# Patient Record
Sex: Male | Born: 1945 | Race: White | Hispanic: No | Marital: Married | Smoking: Former smoker
Health system: Southern US, Community
[De-identification: ages and names within clinical notes are randomized; demographics above are authoritative.]

## PROBLEM LIST (undated history)

## (undated) DIAGNOSIS — G4730 Sleep apnea, unspecified: Secondary | ICD-10-CM

## (undated) DIAGNOSIS — G4733 Obstructive sleep apnea (adult) (pediatric): Secondary | ICD-10-CM

## (undated) DIAGNOSIS — H409 Unspecified glaucoma: Secondary | ICD-10-CM

## (undated) DIAGNOSIS — I10 Essential (primary) hypertension: Secondary | ICD-10-CM

## (undated) DIAGNOSIS — J42 Unspecified chronic bronchitis: Secondary | ICD-10-CM

## (undated) DIAGNOSIS — T8859XA Other complications of anesthesia, initial encounter: Secondary | ICD-10-CM

## (undated) DIAGNOSIS — N200 Calculus of kidney: Secondary | ICD-10-CM

## (undated) DIAGNOSIS — I219 Acute myocardial infarction, unspecified: Secondary | ICD-10-CM

## (undated) DIAGNOSIS — E785 Hyperlipidemia, unspecified: Secondary | ICD-10-CM

## (undated) DIAGNOSIS — J189 Pneumonia, unspecified organism: Secondary | ICD-10-CM

## (undated) DIAGNOSIS — Z973 Presence of spectacles and contact lenses: Secondary | ICD-10-CM

## (undated) DIAGNOSIS — I2510 Atherosclerotic heart disease of native coronary artery without angina pectoris: Secondary | ICD-10-CM

## (undated) DIAGNOSIS — T7840XA Allergy, unspecified, initial encounter: Secondary | ICD-10-CM

## (undated) DIAGNOSIS — M1990 Unspecified osteoarthritis, unspecified site: Secondary | ICD-10-CM

## (undated) DIAGNOSIS — T4145XA Adverse effect of unspecified anesthetic, initial encounter: Secondary | ICD-10-CM

## (undated) HISTORY — PX: EYE SURGERY: SHX253

## (undated) HISTORY — DX: Sleep apnea, unspecified: G47.30

## (undated) HISTORY — DX: Obstructive sleep apnea (adult) (pediatric): G47.33

## (undated) HISTORY — DX: Unspecified glaucoma: H40.9

## (undated) HISTORY — DX: Atherosclerotic heart disease of native coronary artery without angina pectoris: I25.10

## (undated) HISTORY — DX: Essential (primary) hypertension: I10

## (undated) HISTORY — PX: KIDNEY STONE SURGERY: SHX686

## (undated) HISTORY — PX: CARDIAC CATHETERIZATION: SHX172

## (undated) HISTORY — DX: Allergy, unspecified, initial encounter: T78.40XA

## (undated) HISTORY — PX: OTHER SURGICAL HISTORY: SHX169

## (undated) HISTORY — DX: Calculus of kidney: N20.0

## (undated) HISTORY — PX: TONSILLECTOMY: SUR1361

## (undated) HISTORY — DX: Hyperlipidemia, unspecified: E78.5

## (undated) HISTORY — DX: Unspecified chronic bronchitis: J42

---
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
## 2001-03-31 ENCOUNTER — Ambulatory Visit (HOSPITAL_BASED_OUTPATIENT_CLINIC_OR_DEPARTMENT_OTHER): Admission: RE | Admit: 2001-03-31 | Discharge: 2001-03-31 | Payer: Self-pay | Admitting: Internal Medicine

## 2001-03-31 ENCOUNTER — Encounter: Payer: Self-pay | Admitting: Pulmonary Disease

## 2001-04-20 ENCOUNTER — Encounter: Payer: Self-pay | Admitting: Pulmonary Disease

## 2001-05-11 ENCOUNTER — Encounter (INDEPENDENT_AMBULATORY_CARE_PROVIDER_SITE_OTHER): Payer: Self-pay

## 2001-05-11 ENCOUNTER — Other Ambulatory Visit: Admission: RE | Admit: 2001-05-11 | Discharge: 2001-05-11 | Payer: Self-pay | Admitting: Internal Medicine

## 2003-08-27 DIAGNOSIS — I219 Acute myocardial infarction, unspecified: Secondary | ICD-10-CM

## 2003-08-27 HISTORY — DX: Acute myocardial infarction, unspecified: I21.9

## 2004-06-05 ENCOUNTER — Emergency Department (HOSPITAL_COMMUNITY): Admission: EM | Admit: 2004-06-05 | Discharge: 2004-06-05 | Payer: Self-pay | Admitting: Emergency Medicine

## 2004-06-21 ENCOUNTER — Ambulatory Visit (HOSPITAL_COMMUNITY): Admission: RE | Admit: 2004-06-21 | Discharge: 2004-06-21 | Payer: Self-pay | Admitting: Urology

## 2004-07-26 ENCOUNTER — Emergency Department (HOSPITAL_COMMUNITY): Admission: EM | Admit: 2004-07-26 | Discharge: 2004-07-26 | Payer: Self-pay | Admitting: Emergency Medicine

## 2004-07-28 ENCOUNTER — Inpatient Hospital Stay (HOSPITAL_COMMUNITY): Admission: EM | Admit: 2004-07-28 | Discharge: 2004-07-31 | Payer: Self-pay | Admitting: Emergency Medicine

## 2004-07-28 ENCOUNTER — Ambulatory Visit: Payer: Self-pay | Admitting: Internal Medicine

## 2004-08-13 ENCOUNTER — Ambulatory Visit: Payer: Self-pay | Admitting: Cardiology

## 2004-08-18 ENCOUNTER — Encounter (HOSPITAL_COMMUNITY): Admission: RE | Admit: 2004-08-18 | Discharge: 2004-11-16 | Payer: Self-pay | Admitting: Cardiology

## 2004-08-22 ENCOUNTER — Ambulatory Visit: Payer: Self-pay | Admitting: Internal Medicine

## 2004-08-22 ENCOUNTER — Observation Stay (HOSPITAL_COMMUNITY): Admission: AD | Admit: 2004-08-22 | Discharge: 2004-08-23 | Payer: Self-pay | Admitting: Cardiology

## 2004-08-22 ENCOUNTER — Ambulatory Visit: Payer: Self-pay | Admitting: Cardiology

## 2004-09-05 ENCOUNTER — Ambulatory Visit: Payer: Self-pay | Admitting: Cardiology

## 2004-09-12 ENCOUNTER — Ambulatory Visit: Payer: Self-pay | Admitting: Cardiology

## 2004-11-16 ENCOUNTER — Encounter (HOSPITAL_COMMUNITY): Admission: RE | Admit: 2004-11-16 | Discharge: 2005-02-14 | Payer: Self-pay | Admitting: Cardiology

## 2004-11-19 ENCOUNTER — Ambulatory Visit: Payer: Self-pay | Admitting: Cardiology

## 2004-11-20 ENCOUNTER — Ambulatory Visit: Payer: Self-pay | Admitting: Internal Medicine

## 2004-12-14 ENCOUNTER — Ambulatory Visit: Payer: Self-pay | Admitting: Internal Medicine

## 2005-02-11 ENCOUNTER — Ambulatory Visit: Payer: Self-pay | Admitting: Cardiology

## 2005-02-19 ENCOUNTER — Ambulatory Visit: Payer: Self-pay

## 2005-02-21 ENCOUNTER — Ambulatory Visit: Payer: Self-pay

## 2005-04-23 ENCOUNTER — Emergency Department (HOSPITAL_COMMUNITY): Admission: EM | Admit: 2005-04-23 | Discharge: 2005-04-23 | Payer: Self-pay

## 2005-04-23 ENCOUNTER — Ambulatory Visit: Payer: Self-pay | Admitting: Cardiology

## 2005-05-15 ENCOUNTER — Ambulatory Visit: Payer: Self-pay | Admitting: Cardiology

## 2005-08-20 IMAGING — CR DG ABDOMEN 1V
1 series · 1 of 1 positions shown · non-contrast
Comparison: none

HISTORY: Right ureteral calculus,

[view not recorded]
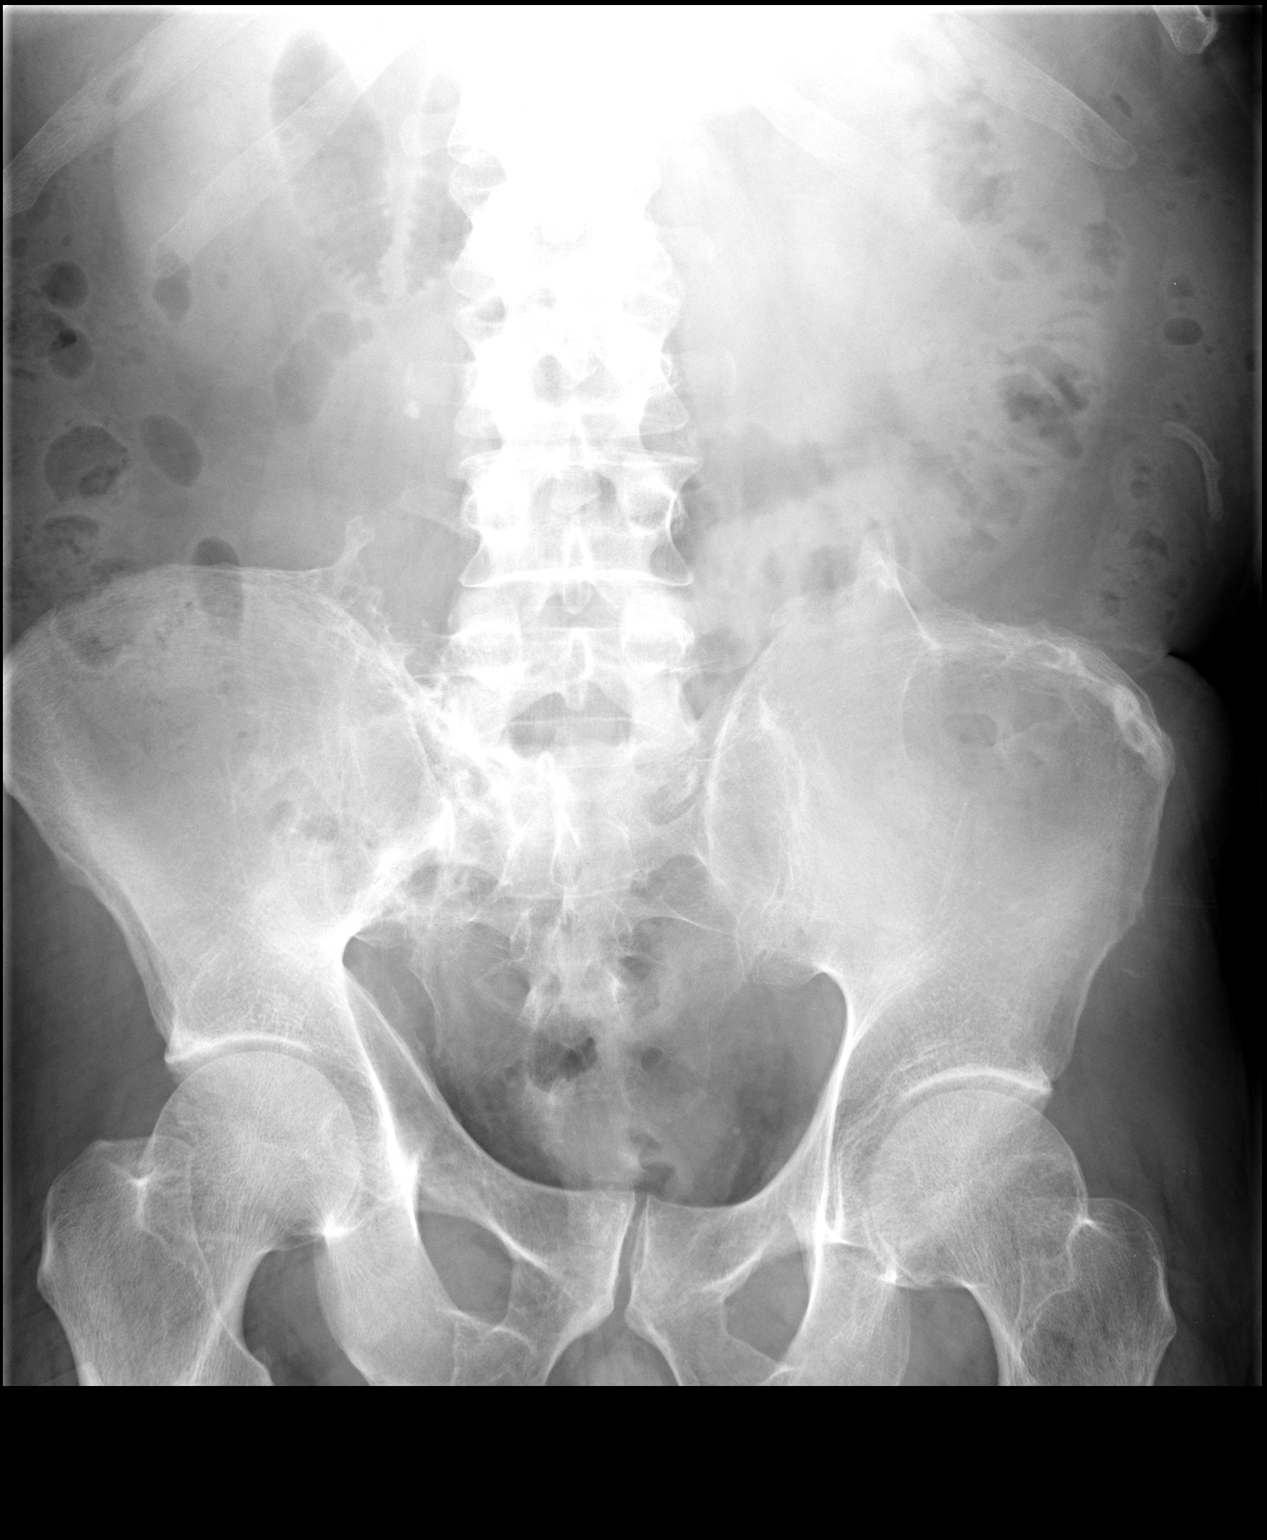

[1 of 1 positions shown; findings below may reference images not displayed]

ABDOMEN ONE VIEW:

5 mm diameter proximal right ureteral calculus, just below the right transverse process of L3.
Tiny left renal calculus, 5 mm diameter.
No additional urinary tract calcification
Scattered enthesopathy and question posttraumatic calcifications at iliac crests.
Question prior right inferior pubic ramus fracture.
Bowel gas pattern normal.
IMPRESSION: 5 mm diameter proximal right ureteral calculus. 5 mm diameter left renal calculus.

## 2006-01-22 ENCOUNTER — Ambulatory Visit: Payer: Self-pay | Admitting: Cardiology

## 2006-07-23 ENCOUNTER — Ambulatory Visit: Payer: Self-pay | Admitting: Cardiology

## 2006-09-04 ENCOUNTER — Ambulatory Visit: Payer: Self-pay | Admitting: Internal Medicine

## 2006-09-04 LAB — CONVERTED CEMR LAB
ALT: 31 units/L (ref 0–40)
AST: 22 units/L (ref 0–37)
Albumin: 3.8 g/dL (ref 3.5–5.2)
Alkaline Phosphatase: 67 units/L (ref 39–117)
BUN: 13 mg/dL (ref 6–23)
CO2: 28 meq/L (ref 19–32)
Calcium: 9.3 mg/dL (ref 8.4–10.5)
Chloride: 105 meq/L (ref 96–112)
Creatinine, Ser: 0.9 mg/dL (ref 0.4–1.5)
GFR calc non Af Amer: 92 mL/min
Glomerular Filtration Rate, Af Am: 111 mL/min/{1.73_m2}
Glucose, Bld: 97 mg/dL (ref 70–99)
PSA: 0.77 ng/mL (ref 0.10–4.00)
Potassium: 4.1 meq/L (ref 3.5–5.1)
Sodium: 140 meq/L (ref 135–145)
Total Bilirubin: 1.5 mg/dL — ABNORMAL HIGH (ref 0.3–1.2)
Total Protein: 6.4 g/dL (ref 6.0–8.3)

## 2006-10-02 ENCOUNTER — Encounter: Payer: Self-pay | Admitting: Internal Medicine

## 2006-10-22 ENCOUNTER — Ambulatory Visit: Payer: Self-pay | Admitting: Emergency Medicine

## 2006-11-21 ENCOUNTER — Ambulatory Visit: Payer: Self-pay | Admitting: Internal Medicine

## 2006-11-26 ENCOUNTER — Ambulatory Visit: Payer: Self-pay | Admitting: Internal Medicine

## 2006-12-03 ENCOUNTER — Ambulatory Visit: Payer: Self-pay | Admitting: Emergency Medicine

## 2007-03-16 ENCOUNTER — Encounter: Payer: Self-pay | Admitting: Internal Medicine

## 2007-03-19 ENCOUNTER — Ambulatory Visit: Payer: Self-pay | Admitting: Cardiology

## 2007-04-17 ENCOUNTER — Encounter (INDEPENDENT_AMBULATORY_CARE_PROVIDER_SITE_OTHER): Payer: Self-pay | Admitting: *Deleted

## 2007-06-17 DIAGNOSIS — Z87442 Personal history of urinary calculi: Secondary | ICD-10-CM | POA: Insufficient documentation

## 2007-06-17 DIAGNOSIS — J42 Unspecified chronic bronchitis: Secondary | ICD-10-CM | POA: Insufficient documentation

## 2007-06-17 DIAGNOSIS — F17200 Nicotine dependence, unspecified, uncomplicated: Secondary | ICD-10-CM | POA: Insufficient documentation

## 2007-06-17 DIAGNOSIS — I219 Acute myocardial infarction, unspecified: Secondary | ICD-10-CM | POA: Insufficient documentation

## 2007-06-17 DIAGNOSIS — N200 Calculus of kidney: Secondary | ICD-10-CM | POA: Insufficient documentation

## 2007-06-23 ENCOUNTER — Ambulatory Visit: Payer: Self-pay | Admitting: Internal Medicine

## 2007-06-23 DIAGNOSIS — G4733 Obstructive sleep apnea (adult) (pediatric): Secondary | ICD-10-CM | POA: Insufficient documentation

## 2007-06-23 DIAGNOSIS — E785 Hyperlipidemia, unspecified: Secondary | ICD-10-CM | POA: Insufficient documentation

## 2007-06-23 DIAGNOSIS — I2510 Atherosclerotic heart disease of native coronary artery without angina pectoris: Secondary | ICD-10-CM | POA: Insufficient documentation

## 2007-06-23 DIAGNOSIS — D126 Benign neoplasm of colon, unspecified: Secondary | ICD-10-CM | POA: Insufficient documentation

## 2007-06-23 DIAGNOSIS — I25119 Atherosclerotic heart disease of native coronary artery with unspecified angina pectoris: Secondary | ICD-10-CM | POA: Insufficient documentation

## 2007-06-24 ENCOUNTER — Ambulatory Visit: Payer: Self-pay | Admitting: Internal Medicine

## 2007-06-24 LAB — CONVERTED CEMR LAB
Bilirubin Urine: NEGATIVE
Blood in Urine, dipstick: NEGATIVE
Glucose, Urine, Semiquant: NEGATIVE
Ketones, urine, test strip: NEGATIVE
Nitrite: NEGATIVE
Protein, U semiquant: NEGATIVE
Specific Gravity, Urine: 1.015
Urobilinogen, UA: NEGATIVE
WBC Urine, dipstick: NEGATIVE
pH: 7

## 2007-07-01 LAB — CONVERTED CEMR LAB
ALT: 22 units/L (ref 0–53)
AST: 18 units/L (ref 0–37)
Albumin: 4 g/dL (ref 3.5–5.2)
Alkaline Phosphatase: 62 units/L (ref 39–117)
BUN: 14 mg/dL (ref 6–23)
Basophils Absolute: 0 10*3/uL (ref 0.0–0.1)
Basophils Relative: 0.5 % (ref 0.0–1.0)
Bilirubin, Direct: 0.1 mg/dL (ref 0.0–0.3)
CO2: 30 meq/L (ref 19–32)
Calcium: 9.1 mg/dL (ref 8.4–10.5)
Chloride: 104 meq/L (ref 96–112)
Cholesterol: 221 mg/dL (ref 0–200)
Creatinine, Ser: 0.8 mg/dL (ref 0.4–1.5)
Direct LDL: 150.1 mg/dL
Eosinophils Absolute: 0.2 10*3/uL (ref 0.0–0.6)
Eosinophils Relative: 2.2 % (ref 0.0–5.0)
GFR calc Af Amer: 127 mL/min
GFR calc non Af Amer: 105 mL/min
Glucose, Bld: 105 mg/dL — ABNORMAL HIGH (ref 70–99)
HCT: 44.7 % (ref 39.0–52.0)
HDL: 51.3 mg/dL (ref >39.0)
Hemoglobin: 15.2 g/dL (ref 13.0–17.0)
Lymphocytes Relative: 20.4 % (ref 12.0–46.0)
MCHC: 34 g/dL (ref 30.0–36.0)
MCV: 92.9 fL (ref 78.0–100.0)
Monocytes Absolute: 0.5 10*3/uL (ref 0.2–0.7)
Monocytes Relative: 7.5 % (ref 3.0–11.0)
Neutro Abs: 4.8 10*3/uL (ref 1.4–7.7)
Neutrophils Relative %: 69.4 % (ref 43.0–77.0)
PSA: 0.71 ng/mL (ref 0.10–4.00)
Platelets: 242 10*3/uL (ref 150–400)
Potassium: 4.2 meq/L (ref 3.5–5.1)
RBC: 4.81 M/uL (ref 4.22–5.81)
RDW: 11 % — ABNORMAL LOW (ref 11.5–14.6)
Sodium: 139 meq/L (ref 135–145)
TSH: 1.11 microintl units/mL (ref 0.35–5.50)
Total Bilirubin: 1.5 mg/dL — ABNORMAL HIGH (ref 0.3–1.2)
Total CHOL/HDL Ratio: 4.3
Total Protein: 6.4 g/dL (ref 6.0–8.3)
Triglycerides: 114 mg/dL (ref 0–149)
VLDL: 23 mg/dL (ref 0–40)
WBC: 6.9 10*3/uL (ref 4.5–10.5)

## 2007-07-02 ENCOUNTER — Telehealth: Payer: Self-pay | Admitting: Internal Medicine

## 2007-07-27 ENCOUNTER — Ambulatory Visit: Payer: Self-pay | Admitting: Internal Medicine

## 2007-08-24 ENCOUNTER — Encounter: Payer: Self-pay | Admitting: Internal Medicine

## 2007-09-07 ENCOUNTER — Ambulatory Visit: Payer: Self-pay | Admitting: Cardiology

## 2007-09-07 LAB — CONVERTED CEMR LAB
ALT: 29 units/L (ref 0–53)
AST: 20 units/L (ref 0–37)
Albumin: 4.2 g/dL (ref 3.5–5.2)
Alkaline Phosphatase: 58 units/L (ref 39–117)
Bilirubin, Direct: 0.1 mg/dL (ref 0.0–0.3)
Cholesterol: 132 mg/dL (ref 0–200)
HDL: 65.3 mg/dL (ref >39.0)
LDL Cholesterol: 49 mg/dL (ref 0–99)
Total Bilirubin: 1.2 mg/dL (ref 0.3–1.2)
Total CHOL/HDL Ratio: 2
Total Protein: 6.9 g/dL (ref 6.0–8.3)
Triglycerides: 88 mg/dL (ref 0–149)
VLDL: 18 mg/dL (ref 0–40)

## 2007-10-05 ENCOUNTER — Ambulatory Visit: Payer: Self-pay | Admitting: Internal Medicine

## 2007-11-10 ENCOUNTER — Ambulatory Visit: Payer: Self-pay | Admitting: Cardiology

## 2007-11-10 LAB — CONVERTED CEMR LAB
ALT: 28 units/L (ref 0–53)
AST: 18 units/L (ref 0–37)
Albumin: 3.8 g/dL (ref 3.5–5.2)
Alkaline Phosphatase: 58 units/L (ref 39–117)
Bilirubin, Direct: 0.2 mg/dL (ref 0.0–0.3)
Cholesterol: 145 mg/dL (ref 0–200)
HDL: 52.3 mg/dL (ref >39.0)
LDL Cholesterol: 76 mg/dL (ref 0–99)
Total Bilirubin: 1 mg/dL (ref 0.3–1.2)
Total CHOL/HDL Ratio: 2.8
Total Protein: 6.3 g/dL (ref 6.0–8.3)
Triglycerides: 84 mg/dL (ref 0–149)
VLDL: 17 mg/dL (ref 0–40)

## 2007-11-30 ENCOUNTER — Ambulatory Visit: Payer: Self-pay | Admitting: Cardiovascular Disease

## 2008-03-23 ENCOUNTER — Ambulatory Visit: Payer: Self-pay | Admitting: Cardiology

## 2008-03-28 ENCOUNTER — Encounter: Payer: Self-pay | Admitting: Internal Medicine

## 2008-03-29 ENCOUNTER — Encounter: Payer: Self-pay | Admitting: Internal Medicine

## 2008-03-29 ENCOUNTER — Ambulatory Visit: Payer: Self-pay

## 2008-04-12 ENCOUNTER — Ambulatory Visit (HOSPITAL_BASED_OUTPATIENT_CLINIC_OR_DEPARTMENT_OTHER): Admission: RE | Admit: 2008-04-12 | Discharge: 2008-04-12 | Payer: Self-pay | Admitting: Orthopedic Surgery

## 2008-04-20 ENCOUNTER — Encounter: Payer: Self-pay | Admitting: Internal Medicine

## 2008-05-27 ENCOUNTER — Ambulatory Visit: Payer: Self-pay | Admitting: Family Medicine

## 2008-05-27 DIAGNOSIS — M25569 Pain in unspecified knee: Secondary | ICD-10-CM | POA: Insufficient documentation

## 2008-05-27 DIAGNOSIS — M542 Cervicalgia: Secondary | ICD-10-CM | POA: Insufficient documentation

## 2008-05-30 ENCOUNTER — Encounter: Payer: Self-pay | Admitting: Internal Medicine

## 2008-05-31 ENCOUNTER — Ambulatory Visit: Payer: Self-pay | Admitting: Cardiovascular Disease

## 2008-05-31 LAB — CONVERTED CEMR LAB
ALT: 25 units/L (ref 0–53)
AST: 15 units/L (ref 0–37)
Albumin: 3.8 g/dL (ref 3.5–5.2)
Alkaline Phosphatase: 50 units/L (ref 39–117)
Bilirubin, Direct: 0.1 mg/dL (ref 0.0–0.3)
Cholesterol: 184 mg/dL (ref 0–200)
HDL: 46.5 mg/dL (ref >39.0)
LDL Cholesterol: 114 mg/dL — ABNORMAL HIGH (ref 0–99)
Total Bilirubin: 1.2 mg/dL (ref 0.3–1.2)
Total CHOL/HDL Ratio: 4
Total Protein: 6.4 g/dL (ref 6.0–8.3)
Triglycerides: 117 mg/dL (ref 0–149)
VLDL: 23 mg/dL (ref 0–40)

## 2008-08-01 ENCOUNTER — Ambulatory Visit: Payer: Self-pay | Admitting: Internal Medicine

## 2008-08-01 LAB — CONVERTED CEMR LAB
ALT: 22 units/L (ref 0–53)
AST: 20 units/L (ref 0–37)
Albumin: 3.9 g/dL (ref 3.5–5.2)
Alkaline Phosphatase: 60 units/L (ref 39–117)
Bilirubin, Direct: 0.3 mg/dL (ref 0.0–0.3)
Cholesterol: 145 mg/dL (ref 0–200)
HDL: 61.6 mg/dL (ref >39.0)
LDL Cholesterol: 64 mg/dL (ref 0–99)
Total Bilirubin: 1.2 mg/dL (ref 0.3–1.2)
Total CHOL/HDL Ratio: 2.4
Total Protein: 6.3 g/dL (ref 6.0–8.3)
Triglycerides: 99 mg/dL (ref 0–149)
VLDL: 20 mg/dL (ref 0–40)

## 2008-08-04 ENCOUNTER — Ambulatory Visit: Payer: Self-pay | Admitting: Cardiology

## 2008-08-08 ENCOUNTER — Encounter: Payer: Self-pay | Admitting: Family Medicine

## 2008-08-11 ENCOUNTER — Ambulatory Visit (HOSPITAL_COMMUNITY): Admission: RE | Admit: 2008-08-11 | Discharge: 2008-08-12 | Payer: Self-pay | Admitting: Orthopedic Surgery

## 2008-08-26 HISTORY — PX: OTHER SURGICAL HISTORY: SHX169

## 2008-09-09 ENCOUNTER — Encounter: Payer: Self-pay | Admitting: Family Medicine

## 2008-10-26 ENCOUNTER — Ambulatory Visit: Payer: Self-pay | Admitting: Internal Medicine

## 2008-10-26 LAB — CONVERTED CEMR LAB
ALT: 24 units/L (ref 0–53)
ALT: 24 units/L (ref 0–53)
AST: 19 units/L (ref 0–37)
AST: 19 units/L (ref 0–37)
Albumin: 3.8 g/dL (ref 3.5–5.2)
Albumin: 3.8 g/dL (ref 3.5–5.2)
Alkaline Phosphatase: 60 units/L (ref 39–117)
Alkaline Phosphatase: 60 units/L (ref 39–117)
Bilirubin, Direct: 0.2 mg/dL (ref 0.0–0.3)
Bilirubin, Direct: 0.2 mg/dL (ref 0.0–0.3)
Cholesterol: 155 mg/dL (ref 0–200)
Cholesterol: 155 mg/dL (ref 0–200)
HDL: 49 mg/dL (ref >39.0)
HDL: 49 mg/dL (ref >39.0)
LDL Cholesterol: 88 mg/dL (ref 0–99)
LDL Cholesterol: 88 mg/dL (ref 0–99)
Total Bilirubin: 1.1 mg/dL (ref 0.3–1.2)
Total Bilirubin: 1.1 mg/dL (ref 0.3–1.2)
Total CHOL/HDL Ratio: 3.2
Total CHOL/HDL Ratio: 3.2
Total Protein: 6.2 g/dL (ref 6.0–8.3)
Total Protein: 6.2 g/dL (ref 6.0–8.3)
Triglycerides: 92 mg/dL (ref 0–149)
Triglycerides: 92 mg/dL (ref 0–149)
VLDL: 18 mg/dL (ref 0–40)
VLDL: 18 mg/dL (ref 0–40)

## 2008-11-03 ENCOUNTER — Ambulatory Visit: Payer: Self-pay | Admitting: Cardiology

## 2009-01-09 ENCOUNTER — Encounter (INDEPENDENT_AMBULATORY_CARE_PROVIDER_SITE_OTHER): Payer: Self-pay | Admitting: *Deleted

## 2009-01-26 DIAGNOSIS — I10 Essential (primary) hypertension: Secondary | ICD-10-CM | POA: Insufficient documentation

## 2009-01-27 ENCOUNTER — Ambulatory Visit: Payer: Self-pay | Admitting: Cardiology

## 2009-01-27 DIAGNOSIS — R0989 Other specified symptoms and signs involving the circulatory and respiratory systems: Secondary | ICD-10-CM | POA: Insufficient documentation

## 2009-02-01 ENCOUNTER — Ambulatory Visit: Payer: Self-pay

## 2009-02-01 ENCOUNTER — Encounter: Payer: Self-pay | Admitting: Cardiology

## 2009-02-13 ENCOUNTER — Telehealth: Payer: Self-pay | Admitting: Cardiology

## 2009-03-07 ENCOUNTER — Telehealth: Payer: Self-pay | Admitting: Cardiology

## 2009-03-16 ENCOUNTER — Encounter: Payer: Self-pay | Admitting: Family Medicine

## 2009-03-20 ENCOUNTER — Telehealth: Payer: Self-pay | Admitting: Cardiology

## 2009-03-23 ENCOUNTER — Telehealth: Payer: Self-pay | Admitting: Cardiology

## 2009-03-28 ENCOUNTER — Encounter: Payer: Self-pay | Admitting: Cardiology

## 2009-04-11 ENCOUNTER — Telehealth: Payer: Self-pay | Admitting: Cardiology

## 2009-05-16 ENCOUNTER — Ambulatory Visit: Payer: Self-pay | Admitting: Internal Medicine

## 2009-05-16 LAB — CONVERTED CEMR LAB
ALT: 27 units/L (ref 0–53)
AST: 24 units/L (ref 0–37)
Albumin: 3.7 g/dL (ref 3.5–5.2)
Alkaline Phosphatase: 57 units/L (ref 39–117)
Bilirubin, Direct: 0.2 mg/dL (ref 0.0–0.3)
Cholesterol: 159 mg/dL (ref 0–200)
HDL: 51.7 mg/dL (ref >39.00)
LDL Cholesterol: 93 mg/dL (ref 0–99)
Total Bilirubin: 1.1 mg/dL (ref 0.3–1.2)
Total CHOL/HDL Ratio: 3
Total Protein: 6.2 g/dL (ref 6.0–8.3)
Triglycerides: 74 mg/dL (ref 0.0–149.0)
VLDL: 14.8 mg/dL (ref 0.0–40.0)

## 2009-05-18 ENCOUNTER — Ambulatory Visit: Payer: Self-pay | Admitting: Cardiology

## 2009-05-23 ENCOUNTER — Observation Stay (HOSPITAL_COMMUNITY): Admission: EM | Admit: 2009-05-23 | Discharge: 2009-05-24 | Payer: Self-pay | Admitting: Emergency Medicine

## 2009-05-23 ENCOUNTER — Ambulatory Visit: Payer: Self-pay | Admitting: Internal Medicine

## 2009-06-02 ENCOUNTER — Encounter (INDEPENDENT_AMBULATORY_CARE_PROVIDER_SITE_OTHER): Payer: Self-pay | Admitting: *Deleted

## 2009-06-07 ENCOUNTER — Encounter: Payer: Self-pay | Admitting: Cardiology

## 2009-06-07 ENCOUNTER — Ambulatory Visit: Payer: Self-pay | Admitting: Cardiology

## 2009-06-08 ENCOUNTER — Telehealth: Payer: Self-pay | Admitting: Cardiology

## 2009-06-27 ENCOUNTER — Ambulatory Visit: Payer: Self-pay | Admitting: Cardiology

## 2009-06-28 LAB — CONVERTED CEMR LAB
ALT: 37 units/L (ref 0–53)
AST: 26 units/L (ref 0–37)
Albumin: 4 g/dL (ref 3.5–5.2)
Alkaline Phosphatase: 59 units/L (ref 39–117)
Bilirubin, Direct: 0.2 mg/dL (ref 0.0–0.3)
Cholesterol: 148 mg/dL (ref 0–200)
HDL: 45.9 mg/dL (ref >39.00)
LDL Cholesterol: 86 mg/dL (ref 0–99)
Total Bilirubin: 1.2 mg/dL (ref 0.3–1.2)
Total CHOL/HDL Ratio: 3
Total Protein: 6.3 g/dL (ref 6.0–8.3)
Triglycerides: 82 mg/dL (ref 0.0–149.0)
VLDL: 16.4 mg/dL (ref 0.0–40.0)

## 2009-06-29 ENCOUNTER — Ambulatory Visit: Payer: Self-pay | Admitting: Internal Medicine

## 2009-06-29 LAB — CONVERTED CEMR LAB
Cholesterol, target level: 200 mg/dL
HDL goal, serum: 40 mg/dL
LDL Goal: 70 mg/dL

## 2009-07-28 ENCOUNTER — Encounter (INDEPENDENT_AMBULATORY_CARE_PROVIDER_SITE_OTHER): Payer: Self-pay | Admitting: *Deleted

## 2009-07-31 ENCOUNTER — Ambulatory Visit: Payer: Self-pay | Admitting: Internal Medicine

## 2009-08-14 ENCOUNTER — Ambulatory Visit: Payer: Self-pay | Admitting: Internal Medicine

## 2009-08-14 LAB — HM COLONOSCOPY

## 2009-08-17 ENCOUNTER — Encounter: Payer: Self-pay | Admitting: Internal Medicine

## 2009-09-14 ENCOUNTER — Ambulatory Visit: Payer: Self-pay | Admitting: Cardiovascular Disease

## 2009-09-14 ENCOUNTER — Telehealth: Payer: Self-pay | Admitting: Cardiology

## 2009-09-14 ENCOUNTER — Inpatient Hospital Stay (HOSPITAL_COMMUNITY): Admission: EM | Admit: 2009-09-14 | Discharge: 2009-09-16 | Payer: Self-pay | Admitting: Emergency Medicine

## 2009-09-25 ENCOUNTER — Telehealth: Payer: Self-pay | Admitting: Cardiology

## 2009-10-08 IMAGING — CR DG CHEST 2V
2 series · 2 of 2 positions shown · non-contrast
Comparison: 04/23/2005 and 08/23/2004 studies.

CLINICAL DATA: History given of hypertension and previous
myocardial infarction for preoperative cardiopulmonary evaluation.

CHEST - 2 VIEW

[view not recorded (1 of 2)]
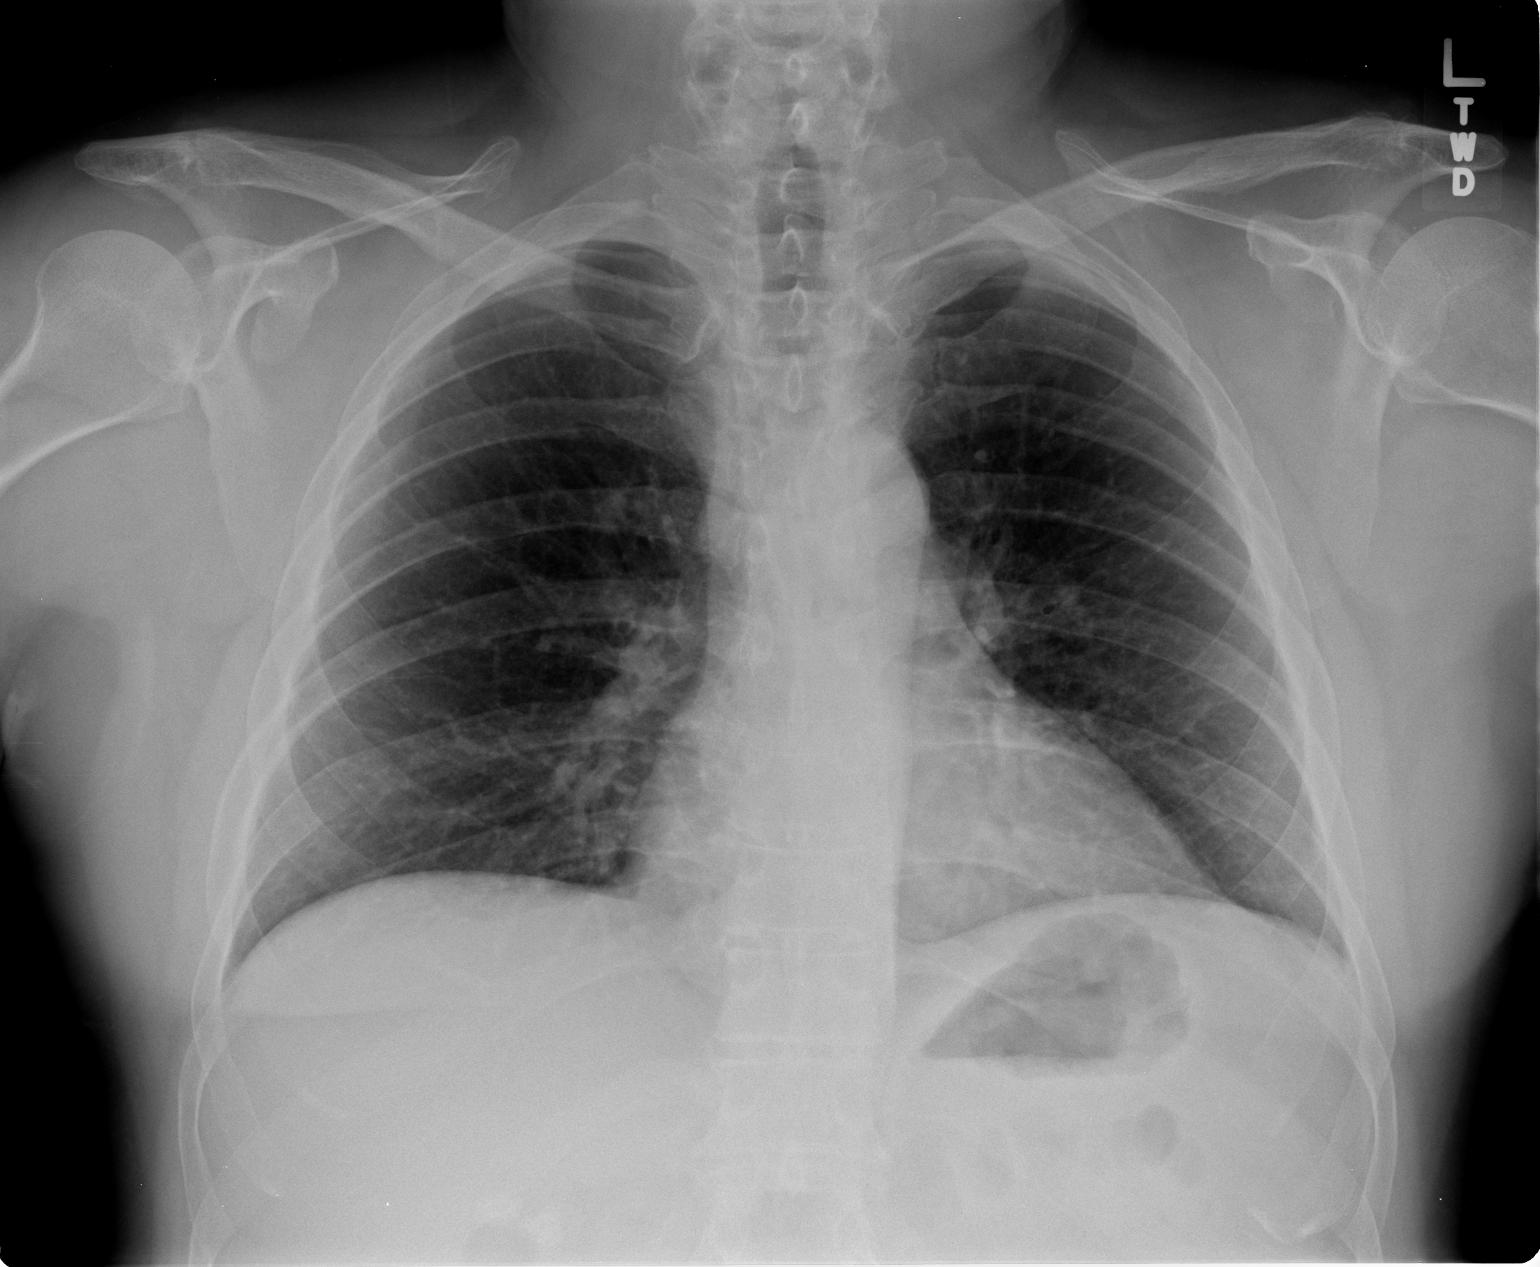

[view not recorded (2 of 2)]
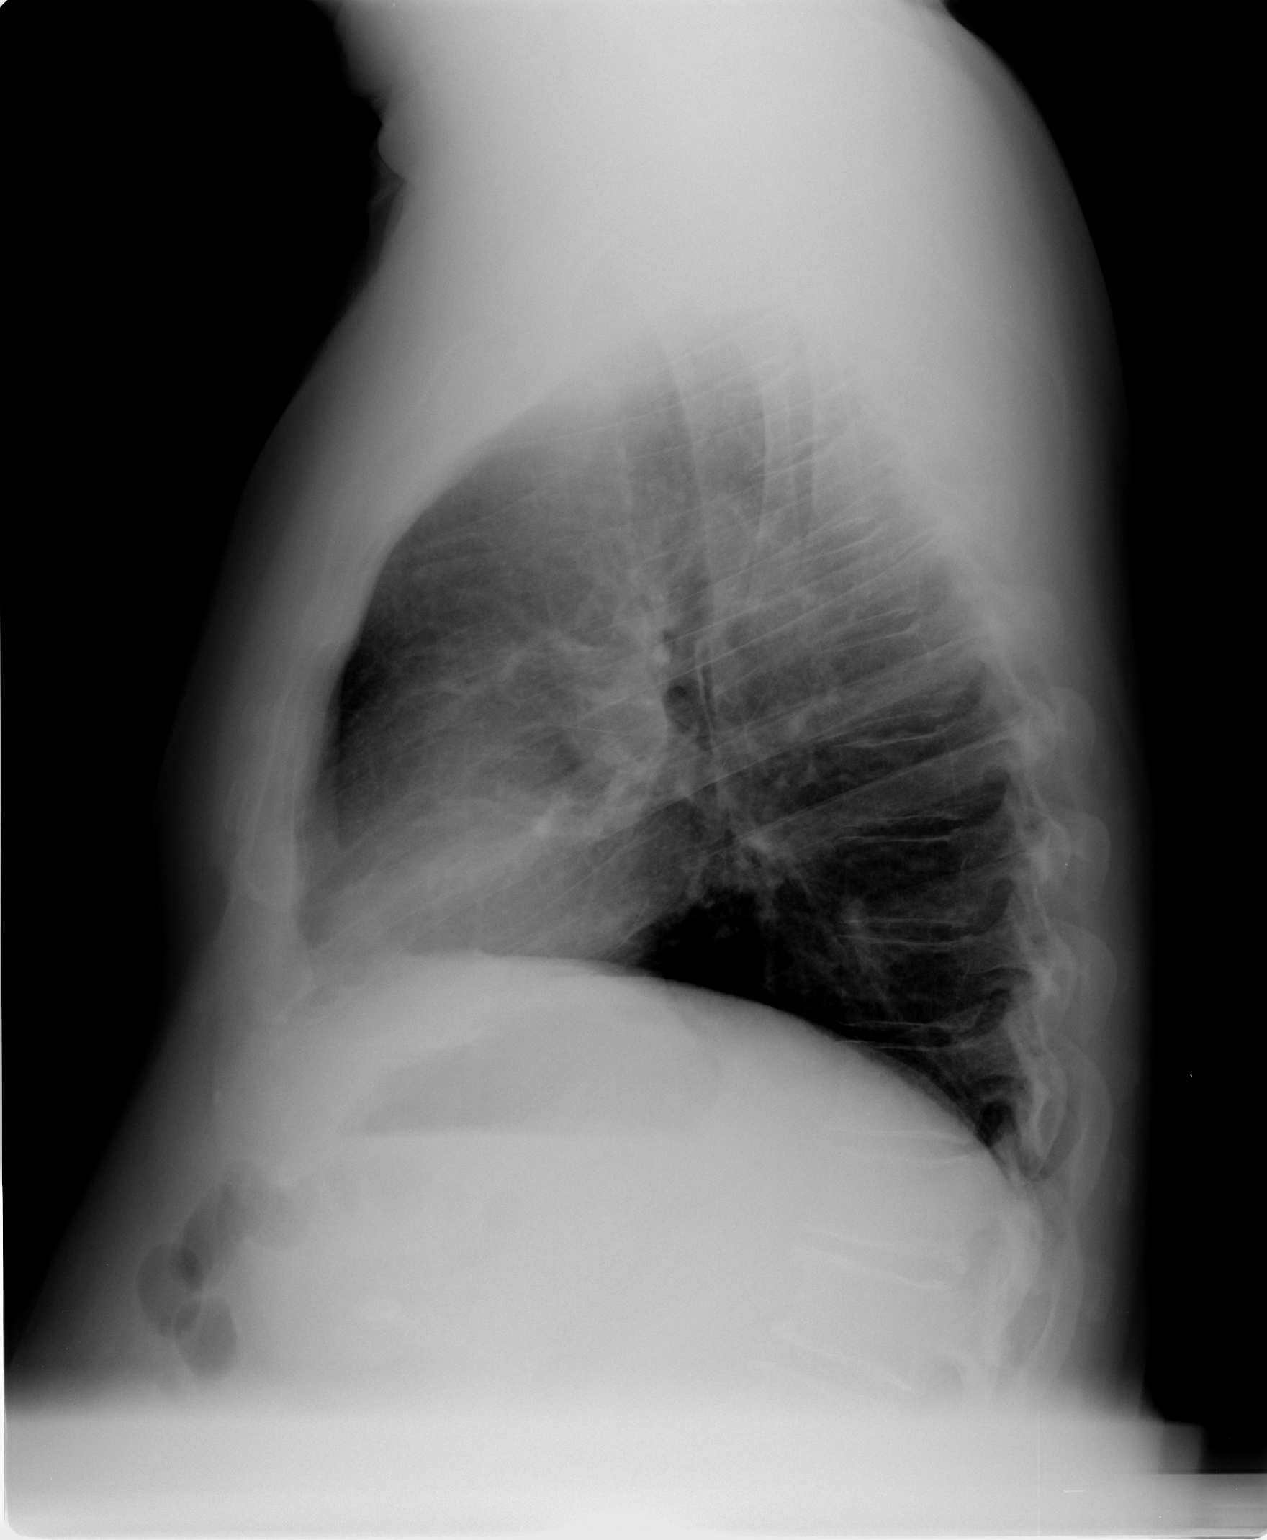

[2 of 2 positions shown; findings below may reference images not displayed]

FINDINGS: Cardiac silhouette is normal size and shape.  No
pulmonary edema, pneumonia, or pleural effusion is seen.  Bones
appear average for age.
IMPRESSION: No acute or active process is identified.

## 2009-10-13 ENCOUNTER — Ambulatory Visit: Payer: Self-pay | Admitting: Cardiology

## 2009-10-13 DIAGNOSIS — F411 Generalized anxiety disorder: Secondary | ICD-10-CM

## 2009-10-13 DIAGNOSIS — R0602 Shortness of breath: Secondary | ICD-10-CM | POA: Insufficient documentation

## 2009-10-13 HISTORY — DX: Generalized anxiety disorder: F41.1

## 2009-10-16 ENCOUNTER — Telehealth (INDEPENDENT_AMBULATORY_CARE_PROVIDER_SITE_OTHER): Payer: Self-pay | Admitting: *Deleted

## 2009-10-16 LAB — CONVERTED CEMR LAB: Pro B Natriuretic peptide (BNP): 15 pg/mL (ref 0.0–100.0)

## 2009-11-20 ENCOUNTER — Ambulatory Visit: Payer: Self-pay | Admitting: Internal Medicine

## 2009-11-21 ENCOUNTER — Encounter (INDEPENDENT_AMBULATORY_CARE_PROVIDER_SITE_OTHER): Payer: Self-pay | Admitting: *Deleted

## 2009-11-24 LAB — CONVERTED CEMR LAB
BUN: 15 mg/dL (ref 6–23)
CO2: 29 meq/L (ref 19–32)
Calcium: 9.2 mg/dL (ref 8.4–10.5)
Chloride: 106 meq/L (ref 96–112)
Creatinine, Ser: 0.9 mg/dL (ref 0.4–1.5)
GFR calc non Af Amer: 90.87 mL/min (ref >60)
Glucose, Bld: 93 mg/dL (ref 70–99)
PSA: 0.63 ng/mL (ref 0.10–4.00)
Potassium: 4.6 meq/L (ref 3.5–5.1)
Sodium: 143 meq/L (ref 135–145)

## 2009-11-29 ENCOUNTER — Encounter: Payer: Self-pay | Admitting: Internal Medicine

## 2009-11-29 ENCOUNTER — Ambulatory Visit: Payer: Self-pay | Admitting: Internal Medicine

## 2009-11-29 ENCOUNTER — Encounter: Admission: RE | Admit: 2009-11-29 | Discharge: 2009-11-29 | Payer: Self-pay | Admitting: Internal Medicine

## 2009-11-30 ENCOUNTER — Encounter: Payer: Self-pay | Admitting: Internal Medicine

## 2009-12-21 ENCOUNTER — Telehealth (INDEPENDENT_AMBULATORY_CARE_PROVIDER_SITE_OTHER): Payer: Self-pay | Admitting: *Deleted

## 2010-03-07 ENCOUNTER — Encounter: Payer: Self-pay | Admitting: Cardiology

## 2010-03-07 ENCOUNTER — Ambulatory Visit: Payer: Self-pay | Admitting: Cardiology

## 2010-03-07 ENCOUNTER — Telehealth (INDEPENDENT_AMBULATORY_CARE_PROVIDER_SITE_OTHER): Payer: Self-pay | Admitting: *Deleted

## 2010-03-07 DIAGNOSIS — R079 Chest pain, unspecified: Secondary | ICD-10-CM | POA: Insufficient documentation

## 2010-03-07 LAB — CONVERTED CEMR LAB
ALT: 30 units/L (ref 0–53)
AST: 20 units/L (ref 0–37)
Albumin: 4.8 g/dL (ref 3.5–5.2)
Alkaline Phosphatase: 75 units/L (ref 39–117)
Bilirubin, Direct: 0.2 mg/dL (ref 0.0–0.3)
Cholesterol: 138 mg/dL (ref 0–200)
HDL: 71 mg/dL (ref >39)
Indirect Bilirubin: 0.6 mg/dL (ref 0.0–0.9)
LDL Cholesterol: 55 mg/dL (ref 0–99)
Total Bilirubin: 0.8 mg/dL (ref 0.3–1.2)
Total CHOL/HDL Ratio: 1.9
Total Protein: 7 g/dL (ref 6.0–8.3)
Triglycerides: 60 mg/dL (ref <150)
VLDL: 12 mg/dL (ref 0–40)

## 2010-03-08 ENCOUNTER — Encounter: Payer: Self-pay | Admitting: Cardiology

## 2010-03-08 ENCOUNTER — Ambulatory Visit: Payer: Self-pay | Admitting: Cardiology

## 2010-03-08 ENCOUNTER — Encounter (HOSPITAL_COMMUNITY): Admission: RE | Admit: 2010-03-08 | Discharge: 2010-05-02 | Payer: Self-pay | Admitting: Cardiology

## 2010-03-08 ENCOUNTER — Ambulatory Visit: Payer: Self-pay

## 2010-03-12 ENCOUNTER — Telehealth: Payer: Self-pay | Admitting: Cardiology

## 2010-03-14 ENCOUNTER — Ambulatory Visit: Payer: Self-pay | Admitting: Cardiology

## 2010-03-14 ENCOUNTER — Encounter: Payer: Self-pay | Admitting: Family Medicine

## 2010-03-30 ENCOUNTER — Telehealth: Payer: Self-pay | Admitting: Cardiology

## 2010-04-04 ENCOUNTER — Telehealth (INDEPENDENT_AMBULATORY_CARE_PROVIDER_SITE_OTHER): Payer: Self-pay | Admitting: *Deleted

## 2010-04-05 ENCOUNTER — Ambulatory Visit: Payer: Self-pay | Admitting: Internal Medicine

## 2010-04-05 DIAGNOSIS — K219 Gastro-esophageal reflux disease without esophagitis: Secondary | ICD-10-CM | POA: Insufficient documentation

## 2010-04-23 ENCOUNTER — Ambulatory Visit: Payer: Self-pay | Admitting: Pulmonary Disease

## 2010-04-26 ENCOUNTER — Ambulatory Visit: Payer: Self-pay | Admitting: Family Medicine

## 2010-04-26 DIAGNOSIS — S61209A Unspecified open wound of unspecified finger without damage to nail, initial encounter: Secondary | ICD-10-CM | POA: Insufficient documentation

## 2010-05-12 ENCOUNTER — Encounter: Payer: Self-pay | Admitting: Pulmonary Disease

## 2010-07-05 ENCOUNTER — Encounter: Payer: Self-pay | Admitting: Internal Medicine

## 2010-07-11 ENCOUNTER — Encounter: Payer: Self-pay | Admitting: Internal Medicine

## 2010-07-31 ENCOUNTER — Encounter: Payer: Self-pay | Admitting: Internal Medicine

## 2010-08-02 ENCOUNTER — Telehealth: Payer: Self-pay | Admitting: Family Medicine

## 2010-08-24 ENCOUNTER — Encounter: Payer: Self-pay | Admitting: Internal Medicine

## 2010-09-26 ENCOUNTER — Ambulatory Visit: Payer: Self-pay | Admitting: Cardiology

## 2010-09-26 ENCOUNTER — Ambulatory Visit: Admit: 2010-09-26 | Payer: Self-pay | Admitting: Cardiology

## 2010-09-27 NOTE — Consult Note (Signed)
Summary: [facility], [facility]   Imported By: Sherian Rein 04/24/2010 07:30:36  _____________________________________________________________________  External Attachment:    Type:   Image     Comment:   External Document

## 2010-09-27 NOTE — Progress Notes (Signed)
Summary: handicap placard /pickup up at [facility]  Phone Note Call from Patient Call back at Work Phone [phone]   Caller: Patient Summary of Call: pt callled and came by and dropped off Handicap Placard, he has artificial leg and needs renewal, Pt not seen since 06/02/08, seeing Cardiology Dr Jens Som regulary, pt says Dr Jens Som would not sign. Pt scheduled [facility] for 11/20/09, placard does not expire until 12/23/09. Pt informed can pickup at [facility] 11/20/09  Initial call taken by: Kandice Hams,  October 16, 2009 11:21 AM

## 2010-09-27 NOTE — Letter (Signed)
Summary: Travel Insured - Attending Physician's Statement  Travel Ins[employer] - Attending Physician's Statement   Imported By: Marylou Mccoy 03/13/2010 13:58:50  _____________________________________________________________________  External Attachment:    Type:   Image     Comment:   External Document

## 2010-09-27 NOTE — Letter (Signed)
Summary: CMN for Prosthetic/Advanced P&O  CMN for Prosthetic/Advanced P&O   Imported By: Lanelle Bal 08/07/2010 10:06:32  _____________________________________________________________________  External Attachment:    Type:   Image     Comment:   External Document

## 2010-09-27 NOTE — Miscellaneous (Signed)
Summary: Orders Update pft charges  Clinical Lists Changes  Orders: Added new Service order of Carbon Monoxide diffusing w/capacity (94720) - Signed Added new Service order of Lung Volumes (94240) - Signed Added new Service order of Spirometry (Pre & Post) (94060) - Signed 

## 2010-09-27 NOTE — Letter (Signed)
Summary: CMN / [facility] [place]  CMN / [facility] [place]   Imported By: Lennie Odor 03/21/2010 14:34:05  _____________________________________________________________________  External Attachment:    Type:   Image     Comment:   External Document

## 2010-09-27 NOTE — Letter (Signed)
Summary: CMN for Prosthetic/Advanced P&O  CMN for Prosthetic/Advanced P&O   Imported By: Lanelle Bal 09/03/2010 09:37:26  _____________________________________________________________________  External Attachment:    Type:   Image     Comment:   External Document
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
## 2010-09-27 NOTE — Progress Notes (Signed)
Summary: results, PFT results  Phone Note Outgoing Call Call back at [facility] Phone [phone]   Summary of Call: PFT RESULTS: advise patient:  PFTs essentially normal.  Good results Signed by Nolon Rod. Paz MD on 12/20/2009 at 5:07 PM  left message with wife to have pt return call Shary Decamp  December 21, 2009 10:25 AM discussed with pt Shary Decamp  December 21, 2009 11:24 AM

## 2010-09-27 NOTE — Progress Notes (Signed)
Summary: appt  Phone Note Call from Patient   Caller: Patient Summary of Call: Pt called and stated that he is taking Priolsec now. He wants to come in and discuss issue with Dr.Paz. Pt has appt tomorrow at 11:20. Army Fossa CMA  April 04, 2010 4:17 PM

## 2010-09-27 NOTE — Letter (Signed)
Summary: CMN for Prosthetic/Advanced P&O  CMN for Prosthetic/Advanced P&O   Imported By: Lanelle Bal 07/20/2010 14:08:15  _____________________________________________________________________  External Attachment:    Type:   Image     Comment:   External Document

## 2010-09-27 NOTE — Assessment & Plan Note (Signed)
Summary: Cardiology Nuclear Study  Nuclear Med Background Indications for Stress Test: Evaluation for Ischemia, Stent Patency   History: Heart Catheterization, Myocardial Infarction, Myocardial Perfusion Study, Stents  History Comments: '05 NSTEMI>Stent-mid LAD; '09 MPS:no ischemia, EF=73%; 1/11 NSTEMI; 2/11 Cath:patent LAD stent, stent jails DX2 90%; h/o OSA  Symptoms: Chest Pressure, Chest Tightness, DOE, Fatigue  Symptoms Comments: Anxiety. Last episode of ZO:XWRUEA, 03/04/10.   Nuclear Pre-Procedure Cardiac Risk Factors: Family History - CAD, History of Smoking, Hypertension, Lipids, Obesity Caffeine/Decaff Intake: None NPO After: 5:30 PM Lungs: Clear.  O2 Sat 95% on RA. IV 0.9% NS with Angio Cath: 18g     IV Site: (R) AC IV Started by: Stanton Kidney EMT-P Chest Size (in) 44     Height (in): 65 Weight (lb): 196 BMI: 32.73  Nuclear Med Study 1 or 2 day study:  1 day     Stress Test Type:  Stress Reading MD:  Olga Millers, MD     Referring MD:  Olga Millers, MD Resting Radionuclide:  Technetium 72m Tetrofosmin     Resting Radionuclide Dose:  10.2 mCi  Stress Radionuclide:  Technetium 30m Tetrofosmin     Stress Radionuclide Dose:  30.0 mCi   Stress Protocol Exercise Time (min):  7:15 min     Max HR:  127 bpm     Predicted Max HR:  158 bpm  Max Systolic BP: 179 mm Hg     Percent Max HR:  80.38 %     METS: 8.4 Rate Pressure Product:  54098    Stress Test Technologist:  [employer] CMA-N     Nuclear Technologist:  Domenic Polite CNMT  Rest Procedure  Myocardial perfusion imaging was performed at rest 45 minutes following the intravenous administration of Myoview Technetium 79m Tetrofosmin.  Stress Procedure  The patient exercised for 7:15.  The patient stopped due to fatigue and denied any chest pain.  There were no diagnostic ST-T wave changes, only occasional PVC's.  Myoview was injected at peak exercise and myocardial perfusion imaging was performed after a brief  delay.  QPS Raw Data Images:  Acuisition technically good; normal left ventricular size. Stress Images:  There is decreased uptake in the anterior wall and apex. Rest Images:  There is decreased uptake in the anterior wall and apex, slightly less prominent compared to the stress images. Subtraction (SDS):  These findings are consistent with small prior infarct and trivial peri-infarct ischemia. Transient Ischemic Dilatation:  .94  (Normal <1.22)  Lung/Heart Ratio:  .36  (Normal <0.45)  Quantitative Gated Spect Images QGS EDV:  84 ml QGS ESV:  29 ml QGS EF:  66 % QGS cine images:  Normal wall motion.   Overall Impression  Exercise Capacity: Fair exercise capacity. BP Response: Normal blood pressure response. Clinical Symptoms: No chest pain ECG Impression: No significant ST segment change suggestive of ischemia. Overall Impression: Abnormal stress nuclear study with small prior anterior and apical infarct; trivial peri-infarct ischemia.  Appended Document: Cardiology Nuclear Study low risk, medical therapy  Appended Document: Cardiology Nuclear Study PT SEE MD 03/14/10 .Zack Seal
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
## 2010-09-27 NOTE — Progress Notes (Signed)
Summary: Prosthesis paperwork--Call in PM  Phone Note Call from Patient Call back at Home Phone [phone]   Summary of Call: Patient would like to know why his prosthesis paperwork has not been completed. Please call him this afternoon. Initial call taken by: Lucious Groves CMA,  August 02, 2010 9:44 AM  Follow-up for Phone Call        I spoke with patient and the company claims that our office has not responded to their faxes. I made him aware that it was faxed 2x, 11/10 and 11/16 but will fax again Attn: Amil Amen. Follow-up by: Lucious Groves CMA,  August 02, 2010 4:55 PM

## 2010-09-27 NOTE — Consult Note (Signed)
Summary: [facility]  [facility]   Imported By: Lanelle Bal 12/13/2009 13:40:59  _____________________________________________________________________  External Attachment:    Type:   Image     Comment:   External Document

## 2010-09-27 NOTE — Letter (Signed)
Summary: Primary Care Consult Scheduled Letter  [place] [facility]  [address]   [place], [place] [address]   Phone: [phone]  Fax: [phone]      11/21/2009 MRN: [id number]  Devin Martin [address] [place], [place]  [address]     Dear [name],  We have scheduled an appointment for you.  At the recommendation of Dr.Paz, we have scheduled you a consult with [facility] on 11/29/09, Wednesday at 1:00PM.  Their address is [address], [place] [address] (2nd floor). The office phone number is [phone].  If this appointment day and time is not convenient for you, please feel free to call the office of the doctor you are being referred to at the number listed above and reschedule the appointment.     Thank you, [facility]  Patient Care Coordinator [place]

## 2010-09-27 NOTE — Progress Notes (Signed)
Summary: general info  Phone Note Call from Patient Call back at Home Phone [phone] Call back at Work Phone [phone]   Caller: Patient Reason for Call: Talk to Nurse Details for Reason: 1. Have a travel medical insurance form that need to be filled out. receive info from [facility] 2.regarding case management. 3. when pt should set up for cardiac rehab.  Initial call taken by: Lorne Skeens,  September 25, 2009 9:33 AM  Follow-up for Phone Call        spoke with pt, all his questions answered Deliah Goody, RN  September 25, 2009 10:40 AM
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
## 2010-09-27 NOTE — Miscellaneous (Signed)
Summary: auto shows poor compliance, optimal pressure 12cm  Clinical Lists Changes  Orders: Added new Referral order of DME Referral (DME) - Signed auto shows terrible compliance, optimal pressure 12cm

## 2010-09-27 NOTE — Progress Notes (Signed)
Summary: chest pain  Phone Note Call from Patient Call back at Home Phone [phone]   Caller: Patient Reason for Call: Talk to Nurse Summary of Call: pt having active chest pain, states he is coming up here and not going back to er, call transfered to Triage Nurse Initial call taken by: Migdalia Dk,  September 14, 2009 11:19 AM  Follow-up for Phone Call        Pt reports he is having chest and arm pain and is SOB. He states his wife is driving him to our office. I instructed pt he would need to go to [facility] ER to be evaluated. He reluctantly agreed to this plan.  Will notify ER. Follow-up by: Dossie Arbour, RN, BSN,  September 14, 2009 11:29 AM

## 2010-09-27 NOTE — Progress Notes (Signed)
Summary: Nuclear Pre Procedure  Phone Note Outgoing Call Call back at [facility] Phone [phone]   Call placed by: Rea College, CMA,  March 07, 2010 3:25 PM Call placed to: Patient Summary of Call: Reviewed information on Myoview Information Sheet (see scanned document for further details).  Patsy spoke with patient.      Nuclear Med Background Indications for Stress Test: Evaluation for Ischemia, Stent Patency   History: Heart Catheterization, Myocardial Infarction, Myocardial Perfusion Study, Stents  History Comments: '05 NSTEMI>Stent-mid LAD; '09 MPS:no ischemia, EF=73%; 1/11 NSTEMI; 2/11 Cath:patent LAD stent, stent jails DX2 90%; h/o OSA  Symptoms: Chest Pain, DOE    Nuclear Pre-Procedure Cardiac Risk Factors: Family History - CAD, History of Smoking, Hypertension, Lipids Height (in): 65
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
## 2010-09-27 NOTE — Progress Notes (Signed)
Summary: pt needs refill  Phone Note Refill Request Call back at Work Phone [phone] Message from:  Patient on [facility] in [place]/[phone]  fax# [phone]  Refills Requested: Medication #1:  NITROSTAT 0.4 MG SUBL 1 tablet under tongue at onset of chest pain; you may repeat every 5 minutes for up to 3 doses.. Initial call taken by: Omer Jack,  March 30, 2010 11:30 AM  Follow-up for Phone Call        called pt and let him know it was called into pharmacy Follow-up by: Kem Parkinson,  March 30, 2010 11:34 AM    Prescriptions: NITROSTAT 0.4 MG SUBL (NITROGLYCERIN) 1 tablet under tongue at onset of chest pain; you may repeat every 5 minutes for up to 3 doses.  #25 x 6   Entered by:   Kem Parkinson   Authorized by:   Ferman Hamming, MD, [facility]   Signed by:   Kem Parkinson on 03/30/2010   Method used:   Electronically to        [facility] [phone]* (retail)       [address], [place], [place]  [id number]       Ph: [phone]       Fax: [phone]   RxID:   [id number]

## 2010-09-27 NOTE — Letter (Signed)
Summary: CMN for Prosthetic/Advanced P&O  CMN for Prosthetic/Advanced P&O   Imported By: Lanelle Bal 07/16/2010 12:59:49  _____________________________________________________________________  External Attachment:    Type:   Image     Comment:   External Document
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
## 2010-09-27 NOTE — Progress Notes (Signed)
Summary: results of nuc study  Phone Note Call from Patient   Caller: Patient Reason for Call: Talk to Nurse Summary of Call: pt wants results from nuc study done last week-pls call [phone] Initial call taken by: Glynda Jaeger,  March 12, 2010 11:03 AM  Follow-up for Phone Call        03/12/10--1140am--pt calling wanting results of stress test--results given to pt,  but pt is insistant that his CP continues and he wants to know exactly why,  and if there is another test he can do or another med he can take to get rid of it--advised he needed to discuss this with dr Jens Som --appoint given to discuss with dr crenshaw--03/14/10--1045am--pt aware Follow-up by: Ledon Snare, RN,  March 12, 2010 11:52 AM

## 2010-09-27 NOTE — Assessment & Plan Note (Signed)
Summary: DR Drue Novel PATIENT--STITCH REMOVAL///SPH   Vital Signs:  Patient profile:   65 year old male Weight:      197 pounds BP sitting:   118 / 80  (left arm)  Vitals Entered By: Doristine Devoid CMA (April 26, 2010 4:33 PM) CC: remove stitches    History of Present Illness: 65 yo man walked in this afternoon to have stitches removed from R middle finger.  cut finger 8 days ago on hedge clippers and had stitches at [facility] in [place].  area now red, raised, 'feels like a blood blister'.  pt isn't sure what to do.  Allergies (verified): 1)  ! Pcn 2)  ! Codeine 3)  ! Tylox  Review of Systems      See HPI  Physical Exam  General:  alert and well-developed.   Pulses:  +2 radial, ulnar Extremities:  pt w/ 4 stitches on palmar surface of R middle finger- stitches loose.  laceration was semi-circle w/ flap.  it appears that flap was re-positioned and then sewn over.  area now erythematous, raised. Neurologic:  sensation to distal finger intact   Impression & Recommendations:  Problem # 1:  LACERATION OF FINGER (ICD-883.0) Assessment New finger laceration is not healing appropriately.  difficult to determine what is going on underneath flap.  stitches removed as they were not serving any purpose.  will refer to hand specialist for re-eval and tx. Orders: Orthopedic Referral (Ortho) Suture Removal by Non-Operative MD [phone])  Complete Medication List: 1)  Isosorbide Mononitrate Cr 60 Mg Xr24h-tab (Isosorbide mononitrate) .Marland Kitchen.. 1 by mouth once daily 2)  Potassium Citrate 10 Meq (1080 Mg) Cr-tabs (Potassium citrate) .... 2 tab by mouth daily 3)  Adult Aspirin Low Strength 81 Mg Tbdp (Aspirin) .Marland Kitchen.. 1 by mouth once daily 4)  Fish Oil Oil (Fish oil) .... Take 1 capsule by mouth two times a day 5)  Glucosamine 500 Mg Caps (Glucosamine sulfate) .... Take 1 capsule by mouth two times a day 6)  Crestor 40 Mg Tabs (Rosuvastatin calcium) .Marland Kitchen.. 1 tab by mouth once daily 7)  Plavix 75 Mg Tabs  (Clopidogrel bisulfate) .... Take one tablet by mouth daily 8)  Diltiazem Hcl Er Beads 360 Mg Xr24h-cap (Diltiazem hcl er beads) .... Take one capsule by mouth daily 9)  Nitrostat 0.4 Mg Subl (Nitroglycerin) .Marland Kitchen.. 1 tablet under tongue at onset of chest pain; you may repeat every 5 minutes for up to 3 doses. 10)  Prilosec 20 Mg Cpdr (Omeprazole) .... Qd

## 2010-10-04 ENCOUNTER — Encounter: Payer: Self-pay | Admitting: Cardiology

## 2010-10-04 ENCOUNTER — Encounter (INDEPENDENT_AMBULATORY_CARE_PROVIDER_SITE_OTHER): Payer: Self-pay | Admitting: *Deleted

## 2010-10-04 ENCOUNTER — Ambulatory Visit (INDEPENDENT_AMBULATORY_CARE_PROVIDER_SITE_OTHER): Payer: BC Managed Care – PPO | Admitting: Cardiology

## 2010-10-04 ENCOUNTER — Other Ambulatory Visit: Payer: Self-pay | Admitting: Cardiology

## 2010-10-04 DIAGNOSIS — I498 Other specified cardiac arrhythmias: Secondary | ICD-10-CM

## 2010-10-04 DIAGNOSIS — E785 Hyperlipidemia, unspecified: Secondary | ICD-10-CM

## 2010-10-04 DIAGNOSIS — I2510 Atherosclerotic heart disease of native coronary artery without angina pectoris: Secondary | ICD-10-CM

## 2010-10-04 DIAGNOSIS — E7800 Pure hypercholesterolemia, unspecified: Secondary | ICD-10-CM

## 2010-10-04 DIAGNOSIS — I10 Essential (primary) hypertension: Secondary | ICD-10-CM

## 2010-10-04 LAB — LIPID PANEL
Cholesterol: 111 mg/dL (ref 0–200)
HDL: 55.9 mg/dL (ref >39.00)
LDL Cholesterol: 42 mg/dL (ref 0–99)
Total CHOL/HDL Ratio: 2
Triglycerides: 67 mg/dL (ref 0.0–149.0)
VLDL: 13.4 mg/dL (ref 0.0–40.0)

## 2010-10-04 LAB — HEPATIC FUNCTION PANEL
ALT: 33 U/L (ref 0–53)
AST: 22 U/L (ref 0–37)
Albumin: 4.3 g/dL (ref 3.5–5.2)
Alkaline Phosphatase: 69 U/L (ref 39–117)
Bilirubin, Direct: 0.1 mg/dL (ref 0.0–0.3)
Total Bilirubin: 0.9 mg/dL (ref 0.3–1.2)
Total Protein: 6.8 g/dL (ref 6.0–8.3)

## 2010-10-04 LAB — BASIC METABOLIC PANEL
BUN: 21 mg/dL (ref 6–23)
CO2: 26 mEq/L (ref 19–32)
Calcium: 9.1 mg/dL (ref 8.4–10.5)
Chloride: 105 mEq/L (ref 96–112)
Creatinine, Ser: 0.9 mg/dL (ref 0.4–1.5)
GFR: 94.23 mL/min (ref >60.00)
Glucose, Bld: 91 mg/dL (ref 70–99)
Potassium: 4.2 mEq/L (ref 3.5–5.1)
Sodium: 139 mEq/L (ref 135–145)
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
## 2010-10-11 NOTE — Letter (Signed)
Summary: [facility] - Lipid  [facility], Main Office  [address]   [place], [place] [address]   Phone: [phone]  Fax: [phone]     October 04, 2010 MRN: [id number]   HITOSHI WERTS [address] [place], [place]  [address]   Dear Mr. BEDOYA,  We have reviewed your cholesterol results.  They are as follows:     Total Cholesterol:    111 (Desirable: less than 200)       HDL  Cholesterol:     55.90  (Desirable: greater than 40 for men and 50 for women)       LDL Cholesterol:       42  (Desirable: less than 100 for low risk and less than 70 for moderate to high risk)       Triglycerides:       67.0  (Desirable: less than 150)  Our recommendations include:These numbers look good. Continue on the same medicine. Sodium, potassium, kidney and Liver function are normal. Take care, Dr. Darel Hong.    Call our office at the number listed above if you have any questions.  Lowering your LDL cholesterol is important, but it is only one of a large number of "risk factors" that may indicate that you are at risk for heart disease, stroke or other complications of hardening of the arteries.  Other risk factors include:   A.  Cigarette Smoking* B.  High Blood Pressure* C.  Obesity* D.   Low HDL Cholesterol (see yours above)* E.   Diabetes Mellitus (higher risk if your is uncontrolled) F.  Family history of premature heart disease G.  Previous history of stroke or cardiovascular disease    *These are risk factors YOU HAVE CONTROL OVER.  For more information, visit .  There is now evidence that lowering the TOTAL CHOLESTEROL AND LDL CHOLESTEROL can reduce the risk of heart disease.  The American Heart Association recommends the following guidelines for the treatment of elevated cholesterol:  1.  If there is now current heart disease and less than two risk factors, TOTAL CHOLESTEROL should be less than 200 and LDL CHOLESTEROL should be less than 100. 2.  If  there is current heart disease or two or more risk factors, TOTAL CHOLESTEROL should be less than 200 and LDL CHOLESTEROL should be less than 70.  A diet low in cholesterol, saturated fat, and calories is the cornerstone of treatment for elevated cholesterol.  Cessation of smoking and exercise are also important in the management of elevated cholesterol and preventing vascular disease.  Studies have shown that 30 to 60 minutes of physical activity most days can help lower blood pressure, lower cholesterol, and keep your weight at a healthy level.  Drug therapy is used when cholesterol levels do not respond to therapeutic lifestyle changes (smoking cessation, diet, and exercise) and remains unacceptably high.  If medication is started, it is important to have you levels checked periodically to evaluate the need for further treatment options.  Thank you,    [employer] Team

## 2010-11-11 LAB — POCT I-STAT, CHEM 8
BUN: 18 mg/dL (ref 6–23)
Calcium, Ion: 1.17 mmol/L (ref 1.12–1.32)
Chloride: 109 mEq/L (ref 96–112)
Creatinine, Ser: 0.8 mg/dL (ref 0.4–1.5)
Glucose, Bld: 108 mg/dL — ABNORMAL HIGH (ref 70–99)
Potassium: 4.3 mEq/L (ref 3.5–5.1)

## 2010-11-11 LAB — COMPREHENSIVE METABOLIC PANEL
ALT: 43 U/L (ref 0–53)
Alkaline Phosphatase: 61 U/L (ref 39–117)
BUN: 10 mg/dL (ref 6–23)
CO2: 27 mEq/L (ref 19–32)
Calcium: 8.2 mg/dL — ABNORMAL LOW (ref 8.4–10.5)
GFR calc non Af Amer: >60 mL/min (ref >60)
Glucose, Bld: 102 mg/dL — ABNORMAL HIGH (ref 70–99)
Potassium: 3.9 mEq/L (ref 3.5–5.1)
Sodium: 133 mEq/L — ABNORMAL LOW (ref 135–145)
Total Protein: 5.9 g/dL — ABNORMAL LOW (ref 6.0–8.3)

## 2010-11-11 LAB — DIFFERENTIAL
Basophils Relative: 1 % (ref 0–1)
Eosinophils Absolute: 0.1 10*3/uL (ref 0.0–0.7)
Eosinophils Relative: 2 % (ref 0–5)
Lymphs Abs: 1.7 10*3/uL (ref 0.7–4.0)
Monocytes Absolute: 0.6 10*3/uL (ref 0.1–1.0)
Monocytes Relative: 9 % (ref 3–12)
Neutrophils Relative %: 63 % (ref 43–77)

## 2010-11-11 LAB — POCT CARDIAC MARKERS
CKMB, poc: 1.6 ng/mL (ref 1.0–8.0)
CKMB, poc: 9.3 ng/mL (ref 1.0–8.0)
Troponin i, poc: 0.69 ng/mL (ref 0.00–0.09)
Troponin i, poc: <0.05 ng/mL (ref 0.00–0.09)

## 2010-11-11 LAB — LIPID PANEL
Cholesterol: 143 mg/dL (ref 0–200)
LDL Cholesterol: 79 mg/dL (ref 0–99)

## 2010-11-11 LAB — CARDIAC PANEL(CRET KIN+CKTOT+MB+TROPI)
CK, MB: 32.1 ng/mL (ref 0.3–4.0)
Relative Index: 8.2 — ABNORMAL HIGH (ref 0.0–2.5)
Total CK: 238 U/L — ABNORMAL HIGH (ref 7–232)
Total CK: 305 U/L — ABNORMAL HIGH (ref 7–232)
Troponin I: 3.72 ng/mL (ref 0.00–0.06)

## 2010-11-11 LAB — CBC
HCT: 46.5 % (ref 39.0–52.0)
Hemoglobin: 14.3 g/dL (ref 13.0–17.0)
Hemoglobin: 15.6 g/dL (ref 13.0–17.0)
MCHC: 33.6 g/dL (ref 30.0–36.0)
MCHC: 34.6 g/dL (ref 30.0–36.0)
MCV: 93.4 fL (ref 78.0–100.0)
Platelets: 184 10*3/uL (ref 150–400)
RBC: 4.98 MIL/uL (ref 4.22–5.81)
RDW: 12.5 % (ref 11.5–15.5)

## 2010-11-11 LAB — CK TOTAL AND CKMB (NOT AT ARMC)
CK, MB: 5.3 ng/mL — ABNORMAL HIGH (ref 0.3–4.0)
Total CK: 121 U/L (ref 7–232)

## 2010-11-20 ENCOUNTER — Telehealth: Payer: Self-pay | Admitting: *Deleted

## 2010-11-20 MED ORDER — AMBULATORY NON FORMULARY MEDICATION: Status: DC

## 2010-11-20 NOTE — Telephone Encounter (Signed)
Ok to send a new Rx

## 2010-11-20 NOTE — Telephone Encounter (Signed)
Addended by: Army Fossa on: 11/20/2010 11:42 AM   Modules accepted: Orders

## 2010-11-30 LAB — TROPONIN I: Troponin I: 0.02 ng/mL (ref 0.00–0.06)

## 2010-11-30 LAB — BASIC METABOLIC PANEL
BUN: 12 mg/dL (ref 6–23)
CO2: 26 mEq/L (ref 19–32)
CO2: 31 mEq/L (ref 19–32)
Calcium: 8.8 mg/dL (ref 8.4–10.5)
Calcium: 9.1 mg/dL (ref 8.4–10.5)
Chloride: 106 mEq/L (ref 96–112)
Creatinine, Ser: 0.76 mg/dL (ref 0.4–1.5)
GFR calc non Af Amer: >60 mL/min (ref >60)
Glucose, Bld: 110 mg/dL — ABNORMAL HIGH (ref 70–99)
Glucose, Bld: 130 mg/dL — ABNORMAL HIGH (ref 70–99)
Sodium: 143 mEq/L (ref 135–145)

## 2010-11-30 LAB — DIFFERENTIAL
Basophils Relative: 1 % (ref 0–1)
Lymphs Abs: 1.7 10*3/uL (ref 0.7–4.0)
Monocytes Relative: 8 % (ref 3–12)
Neutro Abs: 4 10*3/uL (ref 1.7–7.7)
Neutrophils Relative %: 63 % (ref 43–77)

## 2010-11-30 LAB — CBC
Hemoglobin: 14.2 g/dL (ref 13.0–17.0)
Hemoglobin: 15.2 g/dL (ref 13.0–17.0)
MCHC: 34 g/dL (ref 30.0–36.0)
MCHC: 34.1 g/dL (ref 30.0–36.0)
MCV: 96.2 fL (ref 78.0–100.0)
Platelets: 235 10*3/uL (ref 150–400)
RBC: 4.65 MIL/uL (ref 4.22–5.81)
RDW: 12.4 % (ref 11.5–15.5)
RDW: 12.7 % (ref 11.5–15.5)

## 2010-11-30 LAB — CK TOTAL AND CKMB (NOT AT ARMC)
Relative Index: 1.5 (ref 0.0–2.5)
Total CK: 101 U/L (ref 7–232)

## 2010-11-30 LAB — POCT CARDIAC MARKERS
Myoglobin, poc: 72.9 ng/mL (ref 12–200)
Troponin i, poc: <0.05 ng/mL (ref 0.00–0.09)

## 2010-11-30 LAB — PROTIME-INR: Prothrombin Time: 12.2 seconds (ref 11.6–15.2)
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
## 2011-01-08 NOTE — Op Note (Signed)
NAME:  Devin Martin, Devin Martin NO.:  [id number]   MEDICAL RECORD NO.:  [id number]          PATIENT TYPE:  AMB   LOCATION:  DSC                          FACILITY:  [facility]   PHYSICIAN:  Katy Fitch. Sypher, M.D. DATE OF BIRTH:  1945/12/17   DATE OF PROCEDURE:  04/12/2008  DATE OF DISCHARGE:                               OPERATIVE REPORT   PREOPERATIVE DIAGNOSIS:  Chronic stenosing tenosynovitis, right first  dorsal compartment.   POSTOPERATIVE DIAGNOSIS:  Chronic stenosing tenosynovitis, right first  dorsal compartment.  Identification of 2 very swollen slips of the  abductor pollicis longus and no identifiable extensor pollicis brevis.   OPERATIONS:  Release of first dorsal compartment and exploration of  tendons including abductor pollicis longus and extensor pollicis longus  tendon slips.   OPERATIONS:  Katy Fitch. Sypher, MD   ASSISTANT:  Nurse.   ANESTHESIA:  Lidocaine 2% supplemented by IV sedation.  Supervising  anesthesiologist is Dr. Jean Rosenthal.   INDICATIONS:  Devin Martin is a 65 year old gentleman referred through  the courtesy of Dr. Willow Ora for evaluation and management of chronic  radial wrist pain on the right.   He has failed nonoperative measures including splinting, activity  modification, and steroid injection.   Due to a failure to respond to nonoperative measures, he is brought to  the operating at this time for release of his right first dorsal  compartment.   Preoperatively, we discussed the anatomic variation of the first dorsal  compartment.  He understands that we will look for a septum and resect  once it is identified.   After informed consent, he is brought to the operating at this time.   PROCEDURE:  Devin Martin was brought to the operating room and placed  in a supine position on the operating table.   Following an anesthesia consult with Dr. Jean Rosenthal, 2% lidocaine field  block anesthesia supplemented by IV sedation was recommended  and  selected.   Devin Martin was brought to room #1, placed in a supine position on the  table, and under Dr. Edison Martin direct supervision, sedation provided.   The right arm was then prepped with Betadine soap solution and sterilely  draped.  The arm was exsanguinated with an Esmarch bandage and then  arterial tourniquet on the proximal brachium inflated to 220 mmHg.   I proceeded to infiltrate 4 mL of 2% lidocaine into the path of the  intended incision and around the first dorsal compartment.  After  waiting 5 minutes, anesthesia was complete.  A short transverse incision  was fashioned directly over the palpably thickened first dorsal  compartment.  Subcutaneous tissues were carefully divided revealing  considerable edema and adherence of the radial superficial sensory  branches to the compartment.  These were gently cleared with a Market researcher followed by placement of blunt Ragnell retractors.  The  compartment was split on its dorsal surface.  The wall thickness was  greater than 4 mm and there was noted be some calcification within the  wall of the compartment.   I identified 2 very swollen slips of the abductor  pollicis longus that  were invested in inflammatory tenosynovium.   I searched diligently for an extensor pollicis brevis.  None was  identified in the primary compartment.  We searched on the dorsal aspect  of the wrist subcutaneously and could not find an accessory compartment.  We explored to the level of the second dorsal compartment not  identifying an extensor pollicis brevis.   It appeared that the primary stenosing tenosynovitis predicament did  involve the 2 slips of the abductor pollicis longus that had a very  significant waist due to the chronic thickening of the first dorsal  compartment.   The wound was then inspected for bleeding points followed by repair of  the skin with intradermal 3-0 Prolene and Steri-Strips.  There were no  apparent  complications.   PERIOPERATIVE CARE:  Devin Martin is provided a prescription for  Darvocet-N 100 one p.o. 4-6 hours p.r.n. pain, #20 tablets without  refill.      Katy Fitch Sypher, M.D.  Electronically Signed     RVS/MEDQ  D:  04/12/2008  T:  04/13/2008  Job:  [id number]

## 2011-01-08 NOTE — Assessment & Plan Note (Signed)
[facility]                               LIPID CLINIC NOTE   Devin Martin, Devin Martin                      MRN:          [id number]  DATE:07/27/2007                            DOB:          [birth year]    REFERRING PHYSICIAN:  Willow Ora, MD   Devin Martin is a pleasant gentleman seen in the lipid clinic for  further evaluation and medication titration associated with his not-at-  goal LDL. The patient has an interesting history that he experienced  some memory loss on Lipitor. He did see a neurologist for this, no  obvious cause was found and discontinuation of Lipitor has improved this  to some extent. Quality of life is the priority for this patient.   PAST MEDICAL HISTORY:  Pertinent for CAD, status post PCI of the LAD in  December 2005. He states that since his MI he has had memory and  physical strength declines. He also has hyperlipidemia, hypertension and  sleep apnea.   CURRENT MEDICATIONS:  1. Coreg 6.25 mg twice daily.  2. Imdur 30 mg daily.  3. Aspirin 81 mg daily.  4. Plavix 75 mg daily.  5. Potassium citrate ER 10 mEq daily.  6. Fish oil 1 gram daily.  7. Rhodiola Force 300 mg daily.  8. Co-Q 10 150 mg daily.   ALLERGIES:  The patient has possible memory loss associated with LIPITOR  80. It is of note that he took this for 2-1/2 years after his MI until 3  months ago because of his lack of concentration, memory loss and the  thought that he was developing Alzheimer's. He states that he feels more  crisp and alert since discontinuing this medication.   SOCIAL HISTORY:  He does not use tobacco currently. He discontinued this  3 years ago after his myocardial infarction. He has one glass of alcohol  each day as wine and occasionally has an extra drink socially. He  consumes no fried foods, he enjoys fruits, vegetables and oatmeal. He  eats a half sandwich with lean meat for lunch, fish 3-4 times a week,  salad, steak only once a month.  White meats and fish are staples of his  diet.   FAMILY HISTORY:  Pertinent for a 76 year old father who died of an MI  and a 9 year old grandfather that died of MI.   PHYSICAL EXAMINATION:  Weight today is 192 pounds. Blood pressure is  128/78, respirations are 16, heart rate is 68.   LABORATORY DATA:  Labs on June 24, 2007 reveal total cholesterol 221,  triglycerides 114, HDL 51, LDL 150. LFTs are within normal limits.   ASSESSMENT:  The patient has a goal HDL greater than 40 as a tertiary  goal and a non HDL cholesterol of less than 100 as a secondary goal with  LDL primary goal of less than 70 based on his risk factors. He did  experience memory loss but he states that he is willing to give other  options a try. He had heard about Niacin but based on the limitations  that are noted, the  lesser quality, mortality and morbidity data, the  patient demonstrates his inquisitiveness and assertiveness as an active  participant in his health care. He understands the importance of  controlling his risk factors and takes his cardiovascular risk health  carefully. Therefore, the patient has agreed:   PLAN:  1. Start Crestor 20 mg daily. We have called this to [facility] on      [address]. The patient will call with questions or problems in the      meantime and he will monitor memory. He will continue to followup      with his good diet and his exercise activity. He will followup with      lipids and liver in 6 weeks. I did, at the patient's request, look      up Rhodiola 300 mg which the patient has been taking for 2 weeks. I      have no data either way. I have told him it is okay for him to take      this though I do not have safety or efficacy data. Time spent with      the patient is 35 minutes. The patient was seen with Tawni Levy,      PharmD.      Shelby Dubin, PharmD, BCPS, CPP  Electronically Signed      Rollene Rotunda, MD, [facility]  Electronically Signed   MP/MedQ   DD: 07/31/2007  DT: 07/31/2007  Job #: [id number]   cc:   Willow Ora, MD  Madolyn Frieze. Jens Som, MD, [facility]

## 2011-01-08 NOTE — Assessment & Plan Note (Signed)
[facility]                               LIPID CLINIC NOTE   ELROY, SCHEMBRI                      MRN:          [id number]  DATE:11/03/2008                            DOB:          1946/06/26    [name] comes in today for followup of his hyperlipidemia therapy  which includes Crestor 20 mg every other day and fish oil 2 g daily.  He  has been compliant with both of these medications and is tolerating them  just fine with no muscle aches or pains to report.  His other  medications include Plavix, isosorbide mononitrate, aspirin 81 mg,  potassium citrate, and Coreg.   PHYSICAL EXAMINATION:  VITAL SIGNS:  His weight is 194 pounds, blood  pressure is 128/72, heart rate is 80.   LABORATORY DATA:  Total cholesterol 155, triglycerides 92, HDL 49, LDL  88.  Liver function tests are within normal limits.   ASSESSMENT:  [name] LDL has risen from 64-88 and is now not at  the goal of less than 70.  However, his HDL and triglycerides are at  goals.  He has been continuing to try to stick to a heart healthy diet,  although he admits that his portion sizes are within larger time and he  expresses restoration over not be able to lose weight.  Exercise has  been limited likely due to a arthroscopic right knee surgery in  December, but he says it is healing well and he has started back  exercising  the way he had in the past.   PLAN:  We are going to continue both medications at this time, and I  encouraged him with lifestyle modifications and especially to decrease  portion sizes in his diet.  We will follow up with him in 6 months with  a repeat liver and lipid panel, and he was instructed to call us with  any concerns or problems in the meantime.      Charolotte Eke, PharmD  Electronically Signed      Rollene Rotunda, MD, [facility]  Electronically Signed   TP/MedQ  DD: 11/03/2008  DT: 11/03/2008  Job #: [id number]

## 2011-01-08 NOTE — Op Note (Signed)
NAME:  Devin Martin, Devin Martin NO.:  [id number]   MEDICAL RECORD NO.:  [id number]          PATIENT TYPE:  OIB   LOCATION:  5014                         FACILITY:  [facility]   PHYSICIAN:  Vania Rea. Supple, M.D.  DATE OF BIRTH:  12-Feb-1946   DATE OF PROCEDURE:  08/11/2008  DATE OF DISCHARGE:                               OPERATIVE REPORT   PREOPERATIVE DIAGNOSIS:  Right knee chondrocalcinosis with probable  medial meniscus tear.   POSTOPERATIVE DIAGNOSES:  1. Right knee chondrocalcinosis.  2. Right knee medial meniscus tear.  3. Right knee synovitis.  4. 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   PROCEDURES:  1. Right knee diagnostic arthroscopy.  2. Partial medial meniscectomy.  3. Chondroplasty of patellofemoral joint.  4. Extensive synovectomy.  5. Debridement of multiple foci of chondrocalcinosis throughout all      three compartments of the right knee.   SURGEON:  Vania Rea. Supple, MD   ASSISTANT:  Lucita Lora. Shuford, PA-C   ANESTHESIA:  LMA general plus local.   TOURNIQUET TIME:  None was used.   ESTIMATED BLOOD LOSS:  Minimal.   DRAINS:  None.   HISTORY:  Mr. Mowers is a 65 year old gentleman who is status post a  previous left below-knee amputation and presents for evaluation of  increasing right knee pain.  His examination shows discrete medial joint  line tenderness with radiographs confirming evidence for  chondrocalcinosis.  Due to his ongoing pain and functional limitations,  he was brought to the operating at this time for planned right knee  arthroscopy as described below.   I have counseled Mr. Kidd on treatment options as well as risks and  benefits thereof.  Possible surgical complications, bleeding, infection,  neurovascular injury, DVT, PE, as well as persistent pain reviewed.  He  understands and accepts and agrees with our planned procedure.   PROCEDURE IN DETAIL:  After undergoing routine preop evaluation,  the  patient received prophylactic antibiotics.  Placed supine on the  operating table, underwent smooth induction of an LMA general  anesthesia.  Left thigh was placed in leg holder.  The left lower  extremity was sterilely prepped and draped in standard fashion.  Standard portals were established and diagnostic arthroscopy was  performed.  The suprapatellar pouch and gutters showed diffuse synovitis  with multiple foci of chondrocalcinosis.  Extensive synovectomy was  performed.  The patellofemoral joint showed mild softening of the  patellar articular cartilage diffusely with multiple foci of  chondrocalcinosis and the trochlear groove showed an area of severe  cartilage degeneration and grade 3+ chondromalacia over the central  aspect distally approximately 3 cm in diameter with multiple embedded  areas of chondrocalcinosis.  This area was debrided with a shaver and  all aspects of chondrocalcinosis within the patellofemoral joint were  debrided and chondroplasty was performed.  Intercondylar notch and the  ACL were noted to be intact.  Foci of chondrocalcinosis and  intercondylar notch were also debrided.  Medially, there was a complex  degenerative tear of the middle and posterior third medial meniscus with  in addition embedded  calcific deposits and a basket was used to turn the  medial meniscus back to stable peripheral margin.  A shaver was used to  refine contouring and removal of meniscal fragments.  The articular  surfaces were in relatively good condition medially.  Laterally, the  articular surfaces were in good condition.  Lateral meniscus was stable  and again a number of small foci of chondrocalcinosis were debrided with  a shaver.  At this point, final inspection and irrigation was then  completed.  Fluid and instruments were removed.  Combination of  Marcaine, morphine, epinephrine, and clonidine was instilled into the  knee joint.  Additional Marcaine with epinephrine  was instilled about  the portals.  The portals closed with Steri-Strips.  Bulky dry dressing  was then wrapped around the knee and leg was wrapped with Ace bandage  and thigh support stocking.  The patient was then awakened, extubated,  and taken to recovery room in stable condition.      Vania Rea. Supple, M.D.  Electronically Signed     KMS/MEDQ  D:  08/11/2008  T:  08/12/2008  Job:  [id number]

## 2011-01-08 NOTE — Assessment & Plan Note (Signed)
[facility]                            CARDIOLOGY OFFICE NOTE   Devin Martin, Devin Martin                      MRN:          [id number]  DATE:03/19/2007                            DOB:          [birth year]    [name] is a very pleasant gentleman who has a history of coronary  disease, status post PCI of his LAD.  His most recent catheterization  was on August 23, 2004.  His stent was widely patent in the LAD.  He  did have a jailed diagonal which was unchanged from previous.  His  ejection fraction was 60%.  His most recent Myoview was in June of 2006  and it showed an ejection fraction of 68% with mild apical thinning but  no ischemia.  Since I last saw him he has not had any chest pain,  dyspnea, or pedal edema.  There has been no syncope.  He did complain of  decreased memory and he saw Neurology (Dr. Anne Martin) and apparently a full  workup was performed although I do not have those records available.  It  was felt that Lipitor may be contributing and he discontinued this one  month ago and states that his memory is mildly better.   PRESENT MEDICATIONS:  1. Plavix 75 mg p.o. daily.  2. Imdur 30 mg p.o. daily.  3. Aspirin 81 mg p.o. daily.  4. Multivitamin.  5. Potassium.  6. Fish oil.  7. Amlodipine 2.5 mg p.o. daily.  8. Coreg 6.25 mg p.o. b.i.d.   PHYSICAL EXAMINATION:  Today shows a blood pressure of 119/75, and his  pulse is 64.  He weighs 187 pounds.  HEENT:  Normal.  NECK:  Supple with no bruits.  CHEST:  Clear.  CARDIOVASCULAR:  Regular rate and rhythm.  ABDOMINAL:  Benign.  EXTREMITIES:  Show no edema.   His electrocardiogram shows a sinus rhythm at a rate of 64.  There were  no ST changes noted.   DIAGNOSES:  1. Coronary artery disease -- Patient has had no chest pain or      shortness of breath.  We will continue with medical therapy      including his aspirin, Plavix, Imdur and Coreg.  Note he is off his      statin.  2.  Hyperlipidemia -- He discontinued his Lipitor as he felt it may be      contributing to his memory loss.  This was at the suggestion of      Neurology.  We will continue off this for 2 months.  If the      symptoms improve then we would consider trying Pravachol 40 mg p.o.      daily to see if he tolerates this.  If his symptoms do not improve      then we will resume his Lipitor at the present dose and check      Lipids and liver at 6 weeks.  3. Hypertension -- His blood pressure is normal and we will      discontinue his amlodipine.  4. History of sleep apnea --  Per Dr. Delton Martin.   I will see him back in 12 months.  He will continue with risk factor  modification.  We will most likely repeat his Myoview at that time.     Devin Frieze Jens Som, MD, [facility]  Electronically Signed    BSC/MedQ  DD: 03/19/2007  DT: 03/19/2007  Job #: [id number]

## 2011-01-08 NOTE — Assessment & Plan Note (Signed)
[facility]                            CARDIOLOGY OFFICE NOTE   FRANKLIN, BAUMBACH                      MRN:          [id number]  DATE:03/23/2008                            DOB:          12/17/1945    Devin Martin is a pleasant 65 year old gentleman who has a history of  coronary artery disease.  The patient underwent cardiac catheterization  in December 2005 secondary to a non-ST elevation myocardial infarction.  At that time, his ejection fraction was 60%.  He had successful PCI of  his mid LAD with a drug-eluting stent.  Note, there was jailing of 2  small diagonals by the LAD stent.  His most recent catheterization was  performed on August 23, 2004.  His stent was widely patent, but there  was high-grade ostial stenosis of the jailed diagonals, but it was  unchanged from previous.  Since I last saw him, he is doing reasonably  well.  He does describe a tightness in his chest at times when he is  walking up hills or when he is in the cold.  It does increase with  inspiration.  There is no associated nausea, vomiting, diaphoresis, or  shortness of breath.  It does not radiate.  He has had this pain  intermittently since his stent was placed.  He otherwise does not have  dyspnea on exertion, orthopnea, PND, pedal edema, palpitations, or  syncope.   His medications include,  1. Plavix 75 mg p.o. daily.  2. Imdur 30 mg p.o. daily.  3. Aspirin 81 mg p.o. daily.  4. Potassium.  5. Fish oil.  6. Coreg 6.25 mg p.o. b.i.d.  7. Crestor 20 mg p.o. daily.   PHYSICAL EXAMINATION:  VITAL SIGNS:  Blood pressure of 129/82 and his  pulse is 81 and he weighs 191 pounds.  HEENT:  Normal.  NECK:  Supple.  No bruits.  CHEST:  Clear.  CARDIOVASCULAR:  Regular rate and rhythm.  ABDOMINAL:  No tenderness.  EXTREMITIES:  No edema.   His electrocardiogram shows a sinus rhythm at a rate of 75.  The axis is  normal.  There are no ST changes noted.   DIAGNOSES:  1. Coronary artery disease, status post drug-eluting stent to the left      anterior descending in 2005 - he will continue with his aspirin,      Plavix, Imdur, and Coreg.  He will also continue on his statin.  2. Chest pain - some of his symptoms sound somewhat worrisome,      although they have been unchanged since his stent was placed in      2005.  Some of it may be related to ischemia, related to a small      jailed diagonals.  We will schedule him to have a stress Myoview.      If he is at low risk, we will continue with medical therapy.  3. Hyperlipidemia - continue on his Crestor and he is being followed      in the Lipid Clinic now.  4. Hypertension - his blood pressure is adequately  controlled.  5. History of sleep apnea.   Devin Martin will continue with diet and exercise, which he is doing an  excellent job of.  He has not smoked.  I will see him back in 12 months.     Devin Martin Jens Som, MD, [facility]  Electronically Signed    BSC/MedQ  DD: 03/23/2008  DT: 03/24/2008  Job #: [id number]

## 2011-01-08 NOTE — Assessment & Plan Note (Signed)
[facility]                               LIPID CLINIC NOTE   YORDAN, MARTINDALE                      MRN:          [id number]  DATE:09/07/2007                            DOB:          08/01/1946    REFERRING PHYSICIAN:  Willow Ora, MD   Dictated by Luan Pulling, PharmD under Shelby Dubin, PharmD.   PRIMARY Carmen Vallecillo:  Rollene Rotunda, M.D.   REFERRING PHYSICIAN:  Willow Ora, M.D.   HISTORY OF PRESENT ILLNESS:  Mr. Marengo is a pleasant gentleman seen  today in lipid clinic for further evaluation of his cholesterol.  His  previous LDL was near goal at 102, however, has recently been off  Lipitor for a period of time due to reported memory loss and is now on  roughly 3 months of Crestor 20 mg.   PAST MEDICAL HISTORY:  Pertinent to CAD, status post PCI of the LAD in  December of 2005.  He states that since his MI he has had memory and  physical strength decline that he associates to be with Statin therapy.  He also has  hyperlipidemia, hypertension, sleep apnea.   CURRENT MEDICATIONS:  1. Plavix 75 mg daily.  2. Isosorbide mononitrate 30 mg daily.  3. Aspirin 81 mg daily.  4. Multivitamin daily.  5. Potassium citrate ER two t.i.d.  6. Fish oil 2 grams daily.  7. Amlodipine 2.5 mg daily.  8. Coreg 6.25 mg twice daily.  9. Crestor 20 mg at bedtime.   ALLERGIES:  Patient was previously on several months of LIPITOR and  reported decline in his mental abilities to function at work and thought  this was associated to his Lipitor upon which he came off of it for  three months and saw resolution of these symptoms.   PHYSICAL EXAMINATION:  VITAL SIGNS:  Patient's weight is 192.  Blood  pressure 112/70.   LABORATORY DATA:  Labs cannot be reported.  Most recent lipid panel was  in October of 2008.  We are reordering labs for today and will contact  the patient with the results.   FAMILY HISTORY:  Father died of MI at the age of 44 and grandfather  had  an MI at the age of 85.  No other significant family history.   DISCUSSION:  Curious to see where the lipid panel is today after three  months of therapy on Crestor 20 mg daily.  The patient has reported a  recent mental decline similar to what he experienced while on Lipitor.  He, however, feels that the benefits of being on Statin therapy with his  history outweigh the consequences of impaired memory.  We talked at  length about diet and exercise.  His diet consists mostly of multigrain  food choices.  He avoids all fried foods.  He has avoided steak for over  a year and enjoys fish, chicken and vegetables, none of which are fried.  For exercise, he works out roughly 2 hours daily spending 5 miles a day  on the [place] which is all that his leg can allow him  to do and then  boxes for 50 minutes at a time and does weights for 15 to 20 minutes.  He denies smoking, but enjoys one glass of wine nightly.   ASSESSMENT/PLAN:  Will follow up with the patient giving his lipid  results that were tested today.  Meanwhile, we plan to continue Crestor  20 mg daily and will follow up with another lipid panel in 3 to 6  months.      Shelby Dubin, PharmD, BCPS, CPP  Electronically Signed      Madolyn Frieze. Jens Som, MD, [facility]  Electronically Signed   MP/MedQ  DD: 09/07/2007  DT: 09/08/2007  Job #: [id number]   cc:   Willow Ora, MD

## 2011-01-11 NOTE — Discharge Summary (Signed)
NAME:  Devin Martin, Devin Martin NO.:  [id number]   MEDICAL RECORD NO.:  [id number]          PATIENT TYPE:  INP   LOCATION:  2912                         FACILITY:  [facility]   PHYSICIAN:  Olga Millers, M.D. [facility] OF BIRTH:  [birth year]   DATE OF ADMISSION:  07/28/2004  DATE OF DISCHARGE:  07/31/2004                           DISCHARGE SUMMARY - REFERRING   HISTORY OF PRESENT ILLNESS:  The patient is a 65 year old male who presented  to the emergency room complaining of chest discomfort.  He stated that his  first episode of discomfort occurred around 7  p.m. which he described as a  heaviness radiating to both arms and into his neck, associated with  shortness of breath.  He denied any diaphoresis or nausea, and the  discomfort abated on its own.  At approximately 11 p.m. on the evening of  presentation it recurred, however, was more severe but otherwise similar.  After presenting to the emergency room and receiving nitroglycerin, his  discomfort had resolved.  His electrocardiogram did not show any initial  changes.   PAST MEDICAL/SURGICAL HISTORY:  1.  Notable for obstructive sleep apnea.  2.  Kidney stones, for which he is undergoing chronic treatment with Dr.      Boston Service.  3.  Colonoscopy.  4.  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  5.  Tobacco use.   LABORATORY DATA:  Admission H&H 13.4 and 39.0 respectively, normal indices,  platelets 277, WBC's 6.0.  Subsequent hematologies were unremarkable.  Prior  to discharge H&H was 13.3 and 38.3 respectively, platelets 268, WBC's 7.2.  Admission sodium 135, potassium 3.7, BUN 17, creatinine 1.6.  Normal liver  function tests.  Total protein and albumin were low at 5.3 and 3.2  respectively.  Subsequent chemistries were unremarkable.  Prior to discharge  the sodium was 134, potassium 3.9, BUN 7, creatinine 0.8.  Magnesium was 1.9  on July 30, 2004.  Hemoglobin A1c on July 28, 2004, was 5.5.  Initial  CK  total was 124 with an MB of 5.9, relative index 4.8, with a troponin of  0.57.  The second CK total was 171, MB 12.2, relative index 7.1, troponin  1.26.  Subsequent enzymes were declining.  Fasting lipids on July 28, 2004, showed a total cholesterol of 192, triglycerides 100, HDL 49, LDL 123.  TSH 1.493.  An admission chest x-ray did not show any active disease.  Admission electrocardiogram showed normal sinus rhythm, normal axis,  nonspecific ST-T wave changes.  Subsequent electrocardiogram showed T-wave  inversion in V1 through V6.  There is slight ST segment elevation  inferiorly.  On July 30, 2004, electrocardiogram remained as previously.   HOSPITAL COURSE:  The patient was admitted to the hospital by Dr. Duke Salvia and placed on aspirin, Plavix, beta blocker, nitroglycerin and  morphine.  It was felt that he would need a cardiac catheterization.  TSH  was also checked and this was 1.493.  Overnight nursing noted that the  patient experienced some left flank pain, and during urination he passed a  kidney stone.  He was  receiving morphine for the left flank discomfort.  Despite being on IV Integrilin and Lovenox, he was not having any hematuria.  On July 29, 2004, Dr. Gala Romney assessed the patient and he agreed with  the continuing plan, and plan for a cardiac catheterization were made.  On  July 30, 2004, Delton See, P.A. spent an extensive amount of time  speaking with the patient about the catheterization and risk factor  modification.  On July 30, 2004, the patient underwent a cardiac  catheterization by Dr. Salvadore Farber.  According to his progress note,  the patient had an ejection fraction of 60% without wall motion  abnormalities, AS, or MR.  He did have a 90% proximal LAD lesion which was  treated with successful stenting utilizing a Taxus Express-2 stent, reducing  this from 90% to 0%.  It is noted that he does have residual 90% diagonal-I  and  diagonal-II lesions.  The 30% LAD lesion post-stenting.  Dr. Samule Ohm  recommended that he continue Plavix for one year or more, aspirin  indefinitely and smoking cessation.  Post-catheterization the sheaths were  removed.  The catheterization site was intact, and he was mobile without  difficulty, giving his traumatic leg amputation previously.  On July 30, 2004, he also underwent a tobacco cessation consultation.  On July 31, 2004, after being assessed by Dr. Olga Millers, it was felt that he can be  discharged home, after he was evaluated by cardiac rehab.   DISCHARGE DIAGNOSES:  1.  Non-Q-wave myocardial infarction, status post stenting to the left      anterior descending coronary artery, with residual coronary artery      disease as previously described.  2.  Nephrolithiasis.  3.  Osteoarthritis.  4.  Hyperlipidemia.  5.  History as previously.   DISCHARGE MEDICATIONS:  1.  Coated aspirin 325 mg daily.  2.  Plavix 75 mg daily for one year or longer.  3.  Toprol XL 50 mg daily.  4.  Lipitor 80 mg q.h.s.  5.  Nitroglycerin p.r.n.  6.  He was given permission to continue his Dilaudid.  7.  Continue his Vicodin.  8.  Continue his OxyContin.  9.  Continue Wellbutrin, as he was taking previous to admission.  10. He was given permission to use Tylenol for mild pain.   FOLLOWUP:  1.  He will need blood work in approximately six to eight weeks, in regards      to fasting lipids and liver function tests, since Lipitor was initiated.  2.  He will follow up with Dr. Jens Som on August 13, 2004, at 4 p.m.  3.  To arrange a follow-up appointment with Dr. [place] Service.   DISCHARGE INSTRUCTIONS:  1.  He was advised no lifting, driving, sexual activity, working, or heavy      exertion until seen by the physician.  2.  No tub baths for 36 hours.  3.  If he has any problems with his catheterization site, he is asked to      call. 4.  He is advised no smoking or tobacco  products and to limit his alcohol      intake to 2 ounces or two beers per day.   DIET:  He was asked to maintain a low-salt, low-fat, low-cholesterol diet.   NOTATION:  It is noted that prior to discharge the patient will be seen by  cardiac rehab.  It is also noted prior to discharge that the nursing  staff  states that the patient has a very negative outlook, and does not appear to  be very receptive to modifications.  I am also writing him a prescription  for a shower chair that he has requested.       EW/MEDQ  D:  07/31/2004  T:  07/31/2004  Job:  [id number]   cc:   Olga Millers, M.D. [facility]   [place] Service, M.D.  [address]  [place]  [place] [address]  Fax: [phone]

## 2011-01-11 NOTE — Assessment & Plan Note (Signed)
[facility]                               LIPID CLINIC NOTE   Devin Martin, Devin Martin                      MRN:          [id number]  DATE:11/30/2007                            DOB:          12/02/1945    SUBJECTIVE:  The patient seen in the Lipid Clinic for further evaluation  and medication titration associated with his hyperlipidemia in the  setting of documented coronary artery disease, status post PCI to the  LAD in 2005.  He has been feeling and doing well, though he states his  memory continues to be terrible.  He has been taking Crestor 20 mg daily  and fish oil 2 grams twice daily.  He has been feeling and doing okay.  He has decreased his alcohol and his red meat intake.  He does continue  to have portions that are too large.  He is walking and working, walking  for five miles, doing five miles on the elliptical daily.  He was down  to as low as 187 pounds.  He is now at 194 pounds.   PHYSICAL EXAMINATION:  VITAL SIGNS:  Weight today is 194 pounds, blood  pressure 138/72, heart rate 72.   LABORATORY DATA:  From November 10, 2007, reveal a total cholesterol of  145, triglycerides 84, HDL 52.3, LDL 76.  Liver function tests are  within normal limits.   ASSESSMENT:  The patient is at goal for lipid-lowering therapy at this  time.   PLAN:  He will continue on his Crestor 20 mg and fish oil 2 grams twice  daily.  He will continue to work towards losing weight.  He will work on  decreasing his portion size and continue his exercise.   FOLLOWUP:  He will follow up in six months on Thursday, May 27, 2008,  at 2 p.m.   The patient was seen with Pasty Arch, PhamD resident.      Shelby Dubin, PharmD, BCPS, CPP  Electronically Signed      Rollene Rotunda, MD, [facility]  Electronically Signed   MP/MedQ  DD: 12/10/2007  DT: 12/10/2007  Job #: [id number]   cc:   Madolyn Frieze. Jens Som, MD, [facility]

## 2011-01-11 NOTE — Assessment & Plan Note (Signed)
[facility]                             PULMONARY OFFICE NOTE   Devin Martin, Devin Martin                      MRN:          [id number]  DATE:10/22/2006                            DOB:          02-Feb-1946    This is a 65 year old white man with past medical history significant  for coronary artery disease, hyperlipidemia, sleep apnea on CPAP, status  post UPPP in the past, splenectomy in 1969, who was sent here by Dr.  Anne Hahn to do a workup on memory loss.  The patient says that the CPAP  has been working pretty well and he does not have any sleepiness during  the day, shortness of breath, chest pain.  He may be sometimes a little  bit more tired than normal and he has gained 5 pounds in the last  months.  There is no, as I said, hypersomnolence during the day and he  has not had major problems with that since he has been on the CPAP.  He  has not had his CPAP checked in the last 3 years.   PAST MEDICAL HISTORY:  Reviewed.   MEDICATIONS:  Reviewed.   PHYSICAL EXAMINATION:  VITAL SIGNS:  Weight 194, temperature 98.2, blood  pressure 118/82, pulse 97.  Oxygen saturation 98% on room air.  HEENT:  Pharynx is clear.  He is status post UPPP.  NECK:  Supple.  No thyromegaly noted.  No adenopathy.  LUNGS:  Clear to auscultation bilaterally with good air movement.  HEART:  Regular rate and rhythm, no murmurs.  LOWER EXTREMITIES:  On the left he has a prosthesis.  On the right no  edema.   IMPRESSION:  1. Sleep apnea on CPAP.  2. Memory loss.   PLAN:  At this point, we do not think his memory problems are coming  from sleep apnea, as he seems to be very well controlled on current  CPAPs and current settings; however, because the patient has not had his  machine reviewed in a long time, and because he sometimes feels a little  bit more tired than normal, we will schedule him for an auto-titration  machine for 2 weeks and we will review the report.  Currently  he is  supposed to be on a pressure of 12 cm of water according to the last  study in 2005.  If the auto-titration ends up being the same as the  current  parameters, we will leave it up to Dr. Anne Hahn to continue to workup this  memory loss with further neuropsychiatrist tests or what he thinks is  necessary.      Dennis Bast, MD      Leslye Peer, MD  Electronically Signed   YC/MedQ  DD: 10/22/2006  DT: 10/22/2006  Job #: [id number]   cc:   Marlan Palau, M.D.

## 2011-01-11 NOTE — Cardiovascular Report (Signed)
NAME:  Devin, Martin NO.:  [id number]   MEDICAL RECORD NO.:  [id number]          PATIENT TYPE:  INP   LOCATION:  2040                         FACILITY:  [facility]   PHYSICIAN:  Arvilla Meres, M.D. [facility] OF BIRTH:  September 16, 1945   DATE OF PROCEDURE:  08/23/2004  DATE OF DISCHARGE:                              CARDIAC CATHETERIZATION   PRIMARY CARE PHYSICIAN:  Isla Pence, M.D.  Cardiologist is Olga Millers, M.D. [facility].   INDICATIONS FOR PROCEDURE:  The patient is a 65 year old male with recent  non-ST elevation MI, status post PTCA and stent with a Taxus stent to the  mid LAD by Salvadore Farber, M.D. [facility] on July 30, 2004, which was  complicated by the jailing of two diagonals.  He presents with recurrent  chest pain on initiation of cardiac rehab.  His EKG and cardiac markers have  been unrevealing.   PROCEDURE:  1.  Selective coronary angiography.  2.  Left heart catheterization.  3.  Left ventriculogram.   DESCRIPTION OF PROCEDURE:  Risks and benefits of the procedure were  explained to the patient.  Consent was signed and placed on the chart.  The  right groin was prepped and draped in the usual sterile fashion.  Subsequently, the area was then anesthetized with 1% local lidocaine.  A 5  French arterial sheath was placed in the right femoral artery using the  modified Seldinger technique.  Standard catheters including a JL3.5, JR4,  and a bent pigtail were used for the procedure.  All catheter exchanges were  made over a wire.  There were no apparent complications.   FINDINGS:  Aortic pressure was 101/62 with a mean of 79.  LV pressure was  112/16 with no gradient on pullback across the aortic valve.   CORONARY ARTERIES:  1.  Left main was angiographically normal.  2.  LAD.  The mid LAD stent was widely patent with high grade ostial      stenosis of the two jailed diagonals, however, this was totally      unchanged from previous.  3.  Left  circumflex was made up primarily of a large OM.  There were minor      luminal irregularities in the vessel, but no flow-limiting disease.  4.  Right coronary artery was dominant which gives off a PDA and two PL's.      This was angiographically normal.  5.  Left ventriculogram done in the RAO approach shows a visual estimated EF      at about 60% with no wall motion abnormalities or mitral regurgitation.   CONCLUSION:  Widely patent LAD stent.  His angina is possibly secondary to  his jailed diagonals which are not amenable to percutaneous  revascularization.  We will continue with aggressive medical therapy and we  can discharge him home with some oral nitrates or nitroglycerin patch.      [name]   DB/MEDQ  D:  08/23/2004  T:  08/23/2004  Job:  [id number]
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
## 2011-01-11 NOTE — Discharge Summary (Signed)
NAME:  Devin Martin, Devin Martin NO.:  [id number]   MEDICAL RECORD NO.:  [id number]          PATIENT TYPE:  INP   LOCATION:  2912                         FACILITY:  [facility]   PHYSICIAN:  Duke Salvia, M.D.  DATE OF BIRTH:  [birth year]   DATE OF ADMISSION:  07/28/2004  DATE OF DISCHARGE:  07/31/2004                           DISCHARGE SUMMARY - REFERRING   HISTORY OF PRESENT ILLNESS:  Devin Martin is a 65 year old male who  presented to the emergency room complaining of chest discomfort.  He  describes his first episode of chest discomfort approximately 7 p.m. and  described it as a heaviness radiating to both arms and then to his neck  associated with shortness of breath.  He denied any nausea or diaphoresis.  It abated on its own.  However, at about 11 o'clock on the evening of  presentation it recurred but more severe, otherwise similar.  On  presentation to the emergency room, an EKG did not show any specific  changes, however, his discomfort improved with nitroglycerin.   PAST MEDICAL HISTORY:  1.  Obstructive sleep apnea.  2.  Kidney stones for which he is undergoing current therapy by Dr.      Wanda Plump.  3.  Colonoscopy.  4.  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  5.  Tobacco use.   LABORATORY DATA:  Admission H&H was 13.4 and 39.0, normal indices.  Platelets 277, wbc 6.7.  Subsequent hematologies were unremarkable.  Prior  to discharge H&H was 13.3 and 38.3, normal indices, platelets 268, wbc 7.2.  Admission sodium was 135, potassium 3.7, BUN 17, creatinine 1.6.  Normal  LFTs.  It was noted that his total protein and albumin were slightly low at  5.3 and 3.2, respectively.  Prior to discharge, sodium was 134, potassium  3.9, BUN 7, creatinine 0.8.  Magnesium was 1.9 on July 30, 2004.  Admission total CK was 124 with MB of 5.9, relative index 4.8 and a troponin  of 0.57.  Second CK was 171 with MB of 12.2, relative index 7.1 and troponin  of 1.26.  Subsequent CK-MBs and troponins were declining.  Fasting lipids on  July 28, 2004, showed a total cholesterol 192, triglycerides 100, HDL 49,  LDL 123. TSH was 1.493.   Chest x-ray did not show any active disease.   EKGs on admission showed normal sinus rhythm, normal axis, nonspecific STT  wave changes.  Subsequent EKGs while in the hospital showed T-wave inversion  V1 through V6 as well as in I and aVL.  On July 30, 2004, EKG  essentially remained unchanged.   HOSPITAL COURSE:  Devin Martin was admitted by Dr. Graciela Husbands and placed on  aspirin, Plavix, beta-blocker and nitroglycerin.  In addition to the above  labs, Dr. Graciela Husbands also checked a hemoglobin A1C which was 5.5.  During his  hospitalization, he did have some flank discomfort which was improved with  morphine.  With EKG changes he was placed on Integrilin and Lovenox.  Nursing noted that during urination, the patient did pass one regular shaped  stone.  He was not having any problems with hematuria on Integrilin or  heparin.  The following morning, he was  assessed by Dr. Gala Romney and it was noted that he did rule in for non-Q-  wave myocardial infarction.  On July 30, 2004, final plans were made for  cardiac catheterization ...   Dictation stopped at this point.       EW/MEDQ  D:  07/31/2004  T:  07/31/2004  Job:  [id number]   cc:   Olga Millers, M.D. [facility]   [place] Service, M.D.  [address]  [place]  [place] [address]  Fax: [phone]
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
## 2011-01-11 NOTE — Assessment & Plan Note (Signed)
[facility]                            CARDIOLOGY OFFICE NOTE   Devin Martin, Devin Martin                      MRN:          [id number]  DATE:07/23/2006                            DOB:          [birth year]    [name] is a pleasant gentleman who has a history of coronary  disease. Since I last saw him, he is doing well. There is no dyspnea on  exertion, orthopnea, PND, pedal edema, palpitations, presyncope,  syncope, or exertional chest pain. He is exercising routinely and  following a diet. He does not smoke.   CURRENT MEDICATIONS:  1. Plavix 75 mg p.o. daily.  2. Lipitor 80 mg p.o. daily.  3. Imdur 30 mg p.o. daily.  4. Aspirin 81 mg p.o. daily.  5. Multivitamin.  6. Potassium citrate.   PHYSICAL EXAMINATION:  VITAL SIGNS:  Shows a blood pressure of 129/79,  his pulse is 78.  NECK:  Supple with no bruits.  CHEST:  Clear.  CARDIOVASCULAR:  Reveals a regular rate and rhythm.  EXTREMITIES:  Showed no edema.   Electrocardiogram shows a sinus rhythm with no ST changes.   DIAGNOSES:  1. Coronary artery disease.  2. Hyperlipidemia.  3. History of sleep apnea.   PLAN:  [name] is doing well from a symptomatic standpoint. We will  continue with his present medications. Note we had discontinued his  Coreg previously as he was concerned that it was causing fatigue.  However, his symptoms have not changed and we will therefore resume it  at 6.25 mg p.o. b.i.d. If he tolerates it then we will continue this  dose. Note we also checked the TSH and CBC previously secondary to his  fatigue. His hemoglobin was normal as was his TSH. He will continue with  risk factor modification including diet and exercise. Again he does not  smoke. I will see him back in approximately 9 months and we will recheck  his lipids and liver at that point.     Devin Martin Devin Som, MD, [facility]  Electronically Signed   BSC/MedQ  DD: 07/23/2006  DT: 07/23/2006  Job #:  [id number]

## 2011-01-11 NOTE — H&P (Signed)
NAME:  BALDOMERO, MIRARCHI NO.:  [id number]   MEDICAL RECORD NO.:  [id number]          PATIENT TYPE:  INP   LOCATION:  2912                         FACILITY:  [facility]   PHYSICIAN:  Duke Salvia, M.D.  DATE OF BIRTH:  Oct 07, 1945   DATE OF ADMISSION:  07/28/2004  DATE OF DISCHARGE:                                HISTORY & PHYSICAL   HISTORY:  We were asked to see Devin Martin because of typical sounding  chest pain which persisted in the emergency room.   He has no known heart disease. His cardiac risk factors are notable for  family history of his father dying at 75, and cigarettes use.   The patient had his first episode of chest discomfort about 7:00 p.m. He  describes it as a heaviness, radiating into both arms and into his neck  associated with shortness of breath. There was no accompanied diaphoresis or  nausea.  It abated on its own. At about 11:00 this evening, it recurred,  more severely than earlier, but otherwise similar.   He came to the emergency room, his wife having given him aspirin. His blood  pressure was 104/110.  Electrocardiogram was unrevealing.  He continues to have some pain currently, allot of it having improved with  the nitroglycerin.   PAST MEDICAL HISTORY:  Notable for obstructive sleep apnea, kidney stones  for which he is undergoing current therapy by  Dr. Boston Service and a  colonoscopy - routine.   PAST SURGICAL HISTORY:  Related to traumatic amputation of his left leg.   SOCIAL HISTORY:  He is a Art gallery manager, he is married, he has 2  children.  He smokes, occasional alcohol, no recreational drugs.   MEDICATION:  Only Wellbutrin 150 mg twice a day, to help him cut down on his  smoking, which has gone from 1 pack to 3 cigarettes a day.   ALLERGIES:  TYLOX AND CODEINE.   PHYSICAL EXAMINATION:  VITAL SIGNS:  Blood pressure 119/72 with pulse of 90,  and it is currently 104/57 with pulse of 72.  GENERAL:  He is a  middle-aged Caucasian male appearing older than his stated  age of 39.  HEENT:  No icterus or xanthoma.  NECK:  Veins were flat. Carotids were brisk and full bilaterally without  bruits.  BACK: Without kyphosis or scoliosis.  LUNGS:  Clear.  CARDIOVASCULAR:  Heart sounds were regular without murmurs or gallops.  ABDOMEN:  Soft with active bowel sounds without midline pulsation or  hepatomegaly.  EXTREMITIES:  Femoral pulses 2+, distal pulses intact. There is an  amputation of his left leg.  No clubbing, cyanosis or edema.  NEUROLOGIC:  Grossly normal.  SKIN:  Warm and dry.   LABORATORY DATA:  Electrocardiogram demonstrated sinus rhythm at 88 with  interval 0.1/0.8/3.4.  The electrocardiogram was essentially normal.   Additional laboratories demonstrated a normal hemogram, potassium 3.7,  creatinine 1.6.  Initial troponin was 0.08, second troponin was 0.20.  Myoglobins normal.   IMPRESSION:  1.  Acute coronary syndrome with typical chest pain, positive troponins and  essentially normal electrocardiogram.  2.  Two cardiac risk factors notable for positive family history and      positive cigarettes, question lipid status.  3.  Obstructive sleep apnea on CPAP.  4.  Nephrolithiasis.   Devin Martin presents with typical sounding chest pain, with a positive  troponin, in the setting of normal electrocardiogram.  The patient's pre-  test probability for coronary disease is at least 50%; I am a little bit  bothered by the fact that his troponins are positive and myoglobin is not;  although this is likely a function of sensitivity to 2 assays.   We will treat him aggressively for acute coronary syndrome; will need to  investigate other risk factors.   RECOMMENDATIONS:  Based on the above. Therefore:  1.  Continue aspirin.  2.  Begin Plavix.  3.  Beta-blockers.  4.  Nitroglycerin.  5.  MS.  6.  Cath tonight if symptoms persist, otherwise on Monday.  7.  Fasting lipids,  hemoglobin A1c, TSH.  8.  Protonix.       SCK/MEDQ  D:  07/28/2004  T:  07/29/2004  Job:  [id number]   cc:   [employer]

## 2011-01-11 NOTE — Cardiovascular Report (Signed)
NAME:  Devin Martin, Devin Martin NO.:  [id number]   MEDICAL RECORD NO.:  [id number]          PATIENT TYPE:  INP   LOCATION:  2912                         FACILITY:  [facility]   PHYSICIAN:  Salvadore Farber, M.D. LHCDATE OF BIRTH:  Jul 26, 1946   DATE OF PROCEDURE:  07/30/2004  DATE OF DISCHARGE:                              CARDIAC CATHETERIZATION   PROCEDURE:  Left heart catheterization, left ventriculography, coronary  angiography, drug-eluting stent placement in the mid LAD,  intravascular  ultrasound of the LAD.   INDICATION:  Devin Martin is a 65 year old gentleman with no prior history  of cardiovascular disease.  However, he has cardiac risk factors of ongoing  tobacco abuse and markedly positive family history.  He presented on  December 3 non-ST-segment elevation myocardial infarction.  Electrocardiogram demonstrated T-wave inversions across the precordium.  Troponin was elevated at 1.3 with peak CK of 171.  He was referred for  diagnostic angiography with an eye to percutaneous revascularization.   PROCEDURAL TECHNIQUE:  Informed consent was obtained.  Under 1% lidocaine  local anesthesia, a 6 French sheath was placed in the right common femoral  artery using the modified Seldinger technique.  Coronary angiography and  ventriculography were performed using JL-4, JR-4 and pigtail catheters.  This demonstrated severe stenosis of the mid LAD involving the take off of  two fairly small diagonal branches and a large septal perforator.  Ejection  fraction is preserved and there is no significant disease in the RCA and  circumflex.  I therefore recommended percutaneous revascularization.   Anticoagulation was initiated with heparin to achieve and maintain an ACT of  greater than 200 seconds.  Eptifibatide was continued.  600 mg of Plavix was  administered orally.  A CLS-4 guide was advanced over wire and engaged in  the ostium of the left main. A Prowater wire was advanced  without difficulty  into the distal LAD.  The lesion was directly stented using a 2.75 x 16-mm  Taxus deployed at 16 atmospheres.  The stent was post dilated using a 3.0 x  15-mm PowerSail at 16 atmospheres.  Intravascular ultrasound was then  performed to confirm appropriate expansion of the stent.  The ultrasound  probe was advanced beyond the stent.  Automated pullback was performed.  This confirmed excellent stent apposition and full expansion.  There was  substantial residual plaque burden distal to the stent, but without severe  stenosis.  Final angiogram demonstrated no residual stenosis and TIMI-3 flow  to the distal vasculature.  There is approximately 30% angiographic stenosis  just distal to the stent.   COMPLICATIONS:  None.   FINDINGS:  1.  LV:  133/8/12.  EF 60% without regional wall motion abnormality.  2.  No aortic stenosis or mitral regurgitation.  3.  Left main:  Angiographically normal.  4.  LAD:  The LAD is a moderate size vessel giving rise to two small      diagonals, both of which are jailed by the stent.  There was a 90%      stenosis of the mid LAD that was treated with drug-eluting  stenting with      excellent result as assessed by both the angiogram and intravascular      ultrasound.  There are minor luminal irregularities in the remainder of      the LAD.  There is a 30% stenosis just distal to the stent.  5.  Circumflex:  Small vessel giving rise to a single obtuse marginal.      There are only minor luminal irregularities.  6.  RCA:  Large, dominant vessel.  It is angiographically normal.   IMPRESSION/RECOMMENDATIONS:  Successful drug-eluting stent placement in the  mid LAD with excellent result as assessed by angiogram and intravascular  ultrasound.  Smoking cessation has been strongly advised.  The patient  should be continued on Plavix for a minimum of a year and aspirin  indefinitely.      Will   WED/MEDQ  D:  07/30/2004  T:  07/30/2004  Job:   [id number]   cc:   Devin Martin, M.D.

## 2011-01-11 NOTE — Discharge Summary (Signed)
NAME:  Devin Martin, SOUTHWELL NO.:  [id number]   MEDICAL RECORD NO.:  [id number]          PATIENT TYPE:  INP   LOCATION:  2040                         FACILITY:  [facility]   PHYSICIAN:  Olga Millers, M.D. LHCDATE OF BIRTH:  June 21, 1946   DATE OF ADMISSION:  08/22/2004  DATE OF DISCHARGE:  08/23/2004                           DISCHARGE SUMMARY - REFERRING   DISCHARGE DIAGNOSES:  1.  Angina      1.  Patent left anterior descending coronary artery stent by cardiac          catheterization this admission, with noted ostial stenosis of two          jailed diagonals - medical therapy.  2.  Coronary artery disease      1.  Status post non-ST elevation myocardial infarction in December 2005,          treated with a drug-eluting stent to the left anterior descending          coronary artery, complicated by two small jailed diagonals.  3.  Good left ventricular function with an ejection fraction of 60%.  4.  Dyslipidemia.  5.  Obstructive sleep apnea.  6.  History of nephrolithiasis.  7.  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  8.  History of tobacco abuse.  9.  Allergy to TYLOX/CODEINE.   HISTORY OF PRESENT ILLNESS:  Please see the history and physical for  complete details.  Briefly, this 65 year old male patient who recently  suffered a non-ST elevation myocardial infarction in December 2005, treated  with a drug-eluting stent to the LAD, recently started cardiac  rehabilitation.  He began having chest discomfort during cardiac rehab, and  was referred to the emergency room.  He was admitted and placed on heparin  and nitroglycerin.  He ruled out for a myocardial infarction by enzymes.  He  was set up for a cardiac catheterization.   HOSPITAL COURSE:  The cardiac catheterization was done on August 23, 2004.  As noted above, the patient's stent in his LAD was noted to be widely  patent.  There was noted ostial stenosis of two jailed diagonals and no  change from  previous films.  There were minor irregularities in the  circumflex and a normal RCA, with an ejection fraction of 60%.  Dr. Arvilla Meres performed the procedure, and felt that his angina was probably  secondary to the two jailed diagonals.  He recommended medical therapy with  Imdur.   DISPOSITION:  The patient will be discharged to home later today, after his  bedrest is complete, and as long as his groin is stable.   LABORATORY DATA:  White count 6300, hemoglobin 13, hematocrit 38.2, platelet  count 232,000.  INR of 0.8.  Sodium 137, potassium 3.5, chloride 104, CO2 of  28, glucose 97, BUN 8, creatinine 0.8, calcium 8.8.  Cardiac enzymes  negative x3.  A chest x-ray:  Low volume film with cardiomegaly, diffuse interstitial  opacity, has a chronic appearance.   DISCHARGE MEDICATIONS:  1.  Imdur 30 mg daily - new.  2.  Aspirin 325 mg  daily.  3.  Plavix 75 mg daily.  4.  Toprol XL 50 mg daily.  5.  Lipitor 80 mg q.h.s.  6.  Nitroglycerin p.r.n. chest pain.   ACTIVITY:  No driving, no lifting, no heavy exertion, or work or sex for  three days.   DIET:  A low-fat, low-sodium diet.   WOUND CARE:  The patient should call our office for any groin swelling,  bleeding, or bruising.   FOLLOWUP:  Followup will be with Dr. Olga Millers on September 05, 2004, at  4:30 p.m.       SW/MEDQ  D:  08/23/2004  T:  08/23/2004  Job:  [id number]   cc:   Isla Pence, M.D.   Olga Millers, M.D. [facility]

## 2011-01-11 NOTE — Assessment & Plan Note (Signed)
[facility]                             PULMONARY OFFICE NOTE   JAQUAVIOUS, MERCER                      MRN:          [id number]  DATE:12/03/2006                            DOB:          01-31-1946    SUBJECTIVE:  Mr. Statzer is a 65 year old man with history of:  1. Coronary artery disease.  2. Hyperlipidemia.  3. Sleep apnea that had been treated CPAP 12 cm of water.   He is undergoing evaluation by Dr. Lesia Sago in Neurology for lack of  concentration, memory loss, and episodic confusion.  He has been  compliant with his CPAP, and feels that his sleep has not been  significantly interrupted in recent months.  He has gained about 5  pounds.  He denies any daytime sleepiness.  Given these symptoms, we  arranged for an auto titration study to be performed.  We now have the  results, and he is here to review these today.   CURRENT MEDICATIONS:  1. Plavix 75 mg daily.  2. Lipitor 80 mg daily.  3. Isosorbide 30 mg daily.  4. Aspirin 81 mg daily.  5. Multivitamin once daily.  6. Potassium.  7. Citrate ER 2 daily.  8. Fish oil daily.  9. His Coreg was changed to an alternative blood pressure medication,      but he cannot recall the name of the medicine.  10.Nitroglycerin 0.4 mg p.r.n.   EXAMINATION:  GENERAL:  This is a pleasant well-appearing gentleman, in  no distress, on room air.  VITAL SIGNS:  Weight 167 pounds.  Temperature 97.9.  Blood pressure  112/80.  Heart rate 90.  SPO2 is 97% on room air.  HEENT EXAM:  He is status post UPPP.  He has no oropharyngeal lesions.  NECK:  Without stridor.  LUNGS:  Clear to auscultation bilaterally.  HEART:  Has a regular rate and rhythm without murmur.  ABDOMEN:  Benign.  EXTREMITIES:  Have no cyanosis, clubbing, or edema.   His auto titration study was performed in the home by [facility], and completed on November 19, 2006.  This showed that his CPAP  pressure in the 95th percentile  was 11.4 cm of water.  His maximum  pressure was 12.4.  Medium pressure was 9.6.  He had good control of his  apneas and hypopneas during the study.   IMPRESSION:  1. Sleep apnea, currently on CPAP 12 cm of water.  Auto titration      evaluation confirms that this is an adequate pressure.  In fact, he      could consider decreasing to 11 cm of water.  Given his good      compliance, and his satisfaction with his current settings, I will      leave him at 12 cm of water.  He will follow up Dr. Shelle Iron on an as-      needed basis.  2. Memory loss and lack of concentration.  I do not have any evidence      that under treated or inadequately treated obstructive sleep apnea  is contributing to these symptoms.  I will defer to Dr. Anne Hahn      regarding his further workup.     Leslye Peer, MD  Electronically Signed    RSB/MedQ  DD: 12/03/2006  DT: 12/03/2006  Job #: [id number]   cc:   Marlan Palau, M.D.

## 2011-01-28 ENCOUNTER — Other Ambulatory Visit: Payer: Self-pay | Admitting: Cardiology

## 2011-04-02 ENCOUNTER — Other Ambulatory Visit: Payer: Self-pay | Admitting: Cardiology

## 2011-04-09 ENCOUNTER — Encounter: Payer: Self-pay | Admitting: Cardiology

## 2011-04-22 ENCOUNTER — Encounter: Payer: Self-pay | Admitting: Pulmonary Disease

## 2011-04-23 ENCOUNTER — Ambulatory Visit: Payer: Self-pay | Admitting: Pulmonary Disease

## 2011-05-08 ENCOUNTER — Ambulatory Visit: Payer: Self-pay | Admitting: Pulmonary Disease

## 2011-05-31 LAB — URINALYSIS, ROUTINE W REFLEX MICROSCOPIC
Bilirubin Urine: NEGATIVE
Glucose, UA: NEGATIVE mg/dL
Hgb urine dipstick: NEGATIVE
Specific Gravity, Urine: 1.025 (ref 1.005–1.030)
Urobilinogen, UA: 1 mg/dL (ref 0.0–1.0)
pH: 7 (ref 5.0–8.0)

## 2011-05-31 LAB — CBC
Platelets: 247 10*3/uL (ref 150–400)
RDW: 12.5 % (ref 11.5–15.5)
WBC: 7.5 10*3/uL (ref 4.0–10.5)

## 2011-05-31 LAB — COMPREHENSIVE METABOLIC PANEL
ALT: 33 U/L (ref 0–53)
AST: 21 U/L (ref 0–37)
Albumin: 4 g/dL (ref 3.5–5.2)
Alkaline Phosphatase: 65 U/L (ref 39–117)
Chloride: 106 mEq/L (ref 96–112)
GFR calc Af Amer: >60 mL/min (ref >60)
Potassium: 4 mEq/L (ref 3.5–5.1)
Sodium: 140 mEq/L (ref 135–145)
Total Bilirubin: 1.2 mg/dL (ref 0.3–1.2)
Total Protein: 5.9 g/dL — ABNORMAL LOW (ref 6.0–8.3)

## 2011-05-31 LAB — PROTIME-INR: INR: 0.9 (ref 0.00–1.49)

## 2011-07-15 ENCOUNTER — Other Ambulatory Visit: Payer: Self-pay | Admitting: Cardiology

## 2011-07-15 MED ORDER — DILTIAZEM HCL ER 240 MG PO CP24: 240.0000 mg | ORAL_CAPSULE | Freq: Every day | ORAL | Status: DC

## 2011-07-15 MED ORDER — ISOSORBIDE MONONITRATE ER 60 MG PO TB24: 60.0000 mg | ORAL_TABLET | Freq: Every day | ORAL | Status: DC

## 2011-07-15 MED ORDER — ROSUVASTATIN CALCIUM 40 MG PO TABS: 40.0000 mg | ORAL_TABLET | Freq: Every day | ORAL | Status: DC

## 2011-07-15 MED ORDER — CLOPIDOGREL BISULFATE 75 MG PO TABS: 75.0000 mg | ORAL_TABLET | Freq: Every day | ORAL | Status: DC

## 2011-08-12 ENCOUNTER — Ambulatory Visit (INDEPENDENT_AMBULATORY_CARE_PROVIDER_SITE_OTHER): Payer: BC Managed Care – PPO | Admitting: Internal Medicine

## 2011-08-12 ENCOUNTER — Other Ambulatory Visit: Payer: Self-pay | Admitting: Internal Medicine

## 2011-08-12 VITALS — BP 120/76 | HR 67 | Temp 97.6°F | Ht 65.0 in | Wt 198.0 lb

## 2011-08-12 DIAGNOSIS — Z0000 Encounter for general adult medical examination without abnormal findings: Secondary | ICD-10-CM

## 2011-08-12 DIAGNOSIS — E291 Testicular hypofunction: Secondary | ICD-10-CM

## 2011-08-12 NOTE — Assessment & Plan Note (Signed)
in the past he was diagnosed with hypogonadism, was prescribed AndroGel by his urologist, he took it for 2 months, did not feel any different and eventually discontinued it due to to cost. Would like to be checked again. Wonders if his ED is related to low testosterone. I advised the patient that bad may or may not be the case. As far as the ED, he takes Imdur so is not a good candidate for Viagra
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
## 2011-08-12 NOTE — Patient Instructions (Signed)
Came back fasting tomorrow: FLP CMP CBC TSH PSA---dx V70 Testosterone free and total-- dx hypogonadism

## 2011-08-13 ENCOUNTER — Other Ambulatory Visit (INDEPENDENT_AMBULATORY_CARE_PROVIDER_SITE_OTHER): Payer: BC Managed Care – PPO

## 2011-08-13 DIAGNOSIS — Z0000 Encounter for general adult medical examination without abnormal findings: Secondary | ICD-10-CM

## 2011-08-13 LAB — LIPID PANEL
Cholesterol: 114 mg/dL (ref 0–200)
HDL: 52.5 mg/dL (ref >39.00)
LDL Cholesterol: 45 mg/dL (ref 0–99)
Triglycerides: 85 mg/dL (ref 0.0–149.0)

## 2011-08-13 LAB — CBC WITH DIFFERENTIAL/PLATELET
Basophils Absolute: 0 10*3/uL (ref 0.0–0.1)
Eosinophils Absolute: 0.2 10*3/uL (ref 0.0–0.7)
Eosinophils Relative: 4.1 % (ref 0.0–5.0)
HCT: 44.5 % (ref 39.0–52.0)
Hemoglobin: 15 g/dL (ref 13.0–17.0)
Lymphs Abs: 1.9 10*3/uL (ref 0.7–4.0)
MCHC: 33.8 g/dL (ref 30.0–36.0)
Monocytes Absolute: 0.4 10*3/uL (ref 0.1–1.0)
Neutrophils Relative %: 56.1 % (ref 43.0–77.0)
Platelets: 239 10*3/uL (ref 150.0–400.0)
RBC: 4.75 Mil/uL (ref 4.22–5.81)

## 2011-08-13 LAB — TSH: TSH: 1.53 u[IU]/mL (ref 0.35–5.50)

## 2011-08-13 LAB — COMPREHENSIVE METABOLIC PANEL
AST: 21 U/L (ref 0–37)
BUN: 18 mg/dL (ref 6–23)
Calcium: 9.2 mg/dL (ref 8.4–10.5)
Chloride: 106 mEq/L (ref 96–112)
Creatinine, Ser: 0.9 mg/dL (ref 0.4–1.5)
GFR: 92.74 mL/min (ref >60.00)
Total Bilirubin: 1.2 mg/dL (ref 0.3–1.2)

## 2011-08-13 LAB — PSA: PSA: 0.71 ng/mL (ref 0.10–4.00)

## 2011-08-14 LAB — TESTOSTERONE, FREE, TOTAL, SHBG: Testosterone, Free: 47.5 pg/mL (ref 47.0–244.0)

## 2011-08-15 ENCOUNTER — Encounter: Payer: Self-pay | Admitting: Internal Medicine

## 2011-08-15 ENCOUNTER — Encounter: Payer: BC Managed Care – PPO | Admitting: Internal Medicine

## 2011-08-15 ENCOUNTER — Ambulatory Visit (INDEPENDENT_AMBULATORY_CARE_PROVIDER_SITE_OTHER): Payer: BC Managed Care – PPO | Admitting: Internal Medicine

## 2011-08-15 DIAGNOSIS — S88119A Complete traumatic amputation at level between knee and ankle, unspecified lower leg, initial encounter: Secondary | ICD-10-CM

## 2011-08-15 DIAGNOSIS — Z9089 Acquired absence of other organs: Secondary | ICD-10-CM

## 2011-08-15 DIAGNOSIS — E785 Hyperlipidemia, unspecified: Secondary | ICD-10-CM

## 2011-08-15 DIAGNOSIS — E291 Testicular hypofunction: Secondary | ICD-10-CM

## 2011-08-15 DIAGNOSIS — R079 Chest pain, unspecified: Secondary | ICD-10-CM

## 2011-08-15 DIAGNOSIS — Z23 Encounter for immunization: Secondary | ICD-10-CM

## 2011-08-15 DIAGNOSIS — Z0000 Encounter for general adult medical examination without abnormal findings: Secondary | ICD-10-CM | POA: Insufficient documentation

## 2011-08-15 NOTE — Assessment & Plan Note (Addendum)
he needs the following immunizations:  pneumonia shot and  PPV-/23 every 5 years  --- pneumonia shot 2008 Haemophilus B. at least one time----2008 meningococcal Quadrivalent  one time (with possible booster, guidelines are not clear)---2008

## 2011-08-15 NOTE — Assessment & Plan Note (Addendum)
Total testosterone low, free testosterone within normal. I recommend to check a FSH and LH  Patient states that he won't take HRT due to cost Plan; Refer to endocrinology, has multiple questions and  is unsure if he even likes  further testing DEXA
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
## 2011-08-15 NOTE — Assessment & Plan Note (Signed)
Well-controlled per last cholesterol panel 

## 2011-08-15 NOTE — Assessment & Plan Note (Addendum)
Chest pain several months ago, asymptomatic since he is on Prilosec as needed. Recommend to call or go to the ER if chest pain resurface`

## 2011-08-15 NOTE — Assessment & Plan Note (Addendum)
Complains of pain in the left knee, recommend to see the person who provide his orthotics. Then see ortho if problems continue

## 2011-08-15 NOTE — Patient Instructions (Signed)
See your heart doctor 09-2011

## 2011-08-15 NOTE — Assessment & Plan Note (Addendum)
Td 2008 Will review shots related to splenectomy  Flu shot today shingles shot info provided  Was referred to a nutritionist before "didn't help much" continue with exercise colonoscopy 12/10, Next 2015.  all recent labs reviewed

## 2011-10-02 ENCOUNTER — Ambulatory Visit: Payer: BC Managed Care – PPO | Admitting: Cardiology

## 2011-10-16 ENCOUNTER — Ambulatory Visit (INDEPENDENT_AMBULATORY_CARE_PROVIDER_SITE_OTHER): Payer: BC Managed Care – PPO | Admitting: Cardiology

## 2011-10-16 ENCOUNTER — Encounter: Payer: Self-pay | Admitting: Cardiology

## 2011-10-16 VITALS — BP 140/84 | HR 68 | Wt 195.0 lb

## 2011-10-16 DIAGNOSIS — I10 Essential (primary) hypertension: Secondary | ICD-10-CM

## 2011-10-16 DIAGNOSIS — E785 Hyperlipidemia, unspecified: Secondary | ICD-10-CM

## 2011-10-16 DIAGNOSIS — I2510 Atherosclerotic heart disease of native coronary artery without angina pectoris: Secondary | ICD-10-CM
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
## 2011-10-16 NOTE — Assessment & Plan Note (Signed)
Continue aspirin but discontinued Plavix. Continue statin. Previous infarct felt secondary to vasospasm. Continue Cardizem and nitrates.

## 2011-10-16 NOTE — Assessment & Plan Note (Signed)
Continue statin. Recent lipids and liver outstanding. 

## 2011-10-16 NOTE — Assessment & Plan Note (Signed)
Blood pressure controlled. Continue present medications. Potassium and renal function checked in December and was normal.

## 2011-10-16 NOTE — Patient Instructions (Signed)
Your physician wants you to follow-up in: ONE YEAR You will receive a reminder letter in the mail two months in advance. If you don't receive a letter, please call our office to schedule the follow-up appointment.   STOP PLAVIX WHEN FINISHED WITH CURRENT SUPPLY

## 2011-11-04 ENCOUNTER — Telehealth: Payer: Self-pay | Admitting: Cardiology

## 2011-11-04 NOTE — Telephone Encounter (Signed)
Spoke with pt, confirmed with pt he is to stop the plavix when the current bottle is taken.

## 2011-11-04 NOTE — Telephone Encounter (Signed)
Please return call to patient at [phone]  Patient needs to discuss med instructions for plavix RX, he can be reached at [phone].

## 2011-11-20 ENCOUNTER — Telehealth: Payer: Self-pay | Admitting: Internal Medicine

## 2011-11-20 MED ORDER — OMEPRAZOLE 20 MG PO CPDR: 20.0000 mg | DELAYED_RELEASE_CAPSULE | Freq: Every day | ORAL | Status: DC

## 2011-11-20 NOTE — Telephone Encounter (Signed)
Patient stopped into the office this afternoon requesting Dr. Drue Novel write him a rx for generic Prilosec. Pt states a friend told him its much less expensive that buying it over the counter. Patient uses [facility] [address]. Please call pt at [phone] if this can be done.

## 2011-11-20 NOTE — Telephone Encounter (Signed)
Ok 90, 3 RF 

## 2011-11-20 NOTE — Telephone Encounter (Signed)
Done

## 2011-11-20 NOTE — Telephone Encounter (Signed)
OK to send in rx?

## 2011-12-17 ENCOUNTER — Other Ambulatory Visit: Payer: Self-pay | Admitting: Cardiology

## 2011-12-24 ENCOUNTER — Encounter: Payer: Self-pay | Admitting: Family

## 2011-12-24 ENCOUNTER — Ambulatory Visit (INDEPENDENT_AMBULATORY_CARE_PROVIDER_SITE_OTHER): Payer: BC Managed Care – PPO | Admitting: Family

## 2011-12-24 ENCOUNTER — Ambulatory Visit: Payer: BC Managed Care – PPO | Admitting: Internal Medicine

## 2011-12-24 VITALS — BP 140/70 | HR 83 | Temp 98.6°F | Resp 16 | Wt 194.0 lb

## 2011-12-24 DIAGNOSIS — J329 Chronic sinusitis, unspecified: Secondary | ICD-10-CM

## 2011-12-24 MED ORDER — DOXYCYCLINE HYCLATE 100 MG PO TABS: 100.0000 mg | ORAL_TABLET | Freq: Two times a day (BID) | ORAL | Status: AC

## 2011-12-24 NOTE — Patient Instructions (Signed)
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
## 2011-12-25 DIAGNOSIS — J329 Chronic sinusitis, unspecified: Secondary | ICD-10-CM | POA: Insufficient documentation

## 2011-12-25 NOTE — Assessment & Plan Note (Signed)
Will rx with doxycycline.  Pt instructed to call if symptoms worsen, or if no improvement in 2-3 days.

## 2012-02-18 ENCOUNTER — Telehealth: Payer: Self-pay | Admitting: *Deleted

## 2012-02-18 NOTE — Telephone Encounter (Signed)
Prior Auth denied: coverage beyond 90 days per 180 day period is providied for severe/atypical GERD with related disorders.Please advise

## 2012-02-18 NOTE — Telephone Encounter (Signed)
Discussed with pt, he is coming in tomorrow for an office visit.

## 2012-02-18 NOTE — Telephone Encounter (Signed)
He is due for office visit this month, advise patient to take Prilosec over-the-counter until his visit

## 2012-02-19 ENCOUNTER — Ambulatory Visit (INDEPENDENT_AMBULATORY_CARE_PROVIDER_SITE_OTHER): Payer: BC Managed Care – PPO | Admitting: Internal Medicine

## 2012-02-19 VITALS — BP 140/82 | HR 77 | Temp 97.8°F | Wt 192.0 lb

## 2012-02-19 DIAGNOSIS — K219 Gastro-esophageal reflux disease without esophagitis: Secondary | ICD-10-CM

## 2012-02-19 DIAGNOSIS — E291 Testicular hypofunction: Secondary | ICD-10-CM

## 2012-02-19 DIAGNOSIS — I10 Essential (primary) hypertension: Secondary | ICD-10-CM

## 2012-02-19 NOTE — Patient Instructions (Addendum)
Check the  blood pressure 2 or 3 times a week, be sure it is between 110/60 and 140/85. If it is consistently higher or lower, let me know  

## 2012-02-19 NOTE — Assessment & Plan Note (Signed)
BP slightly elevated today, no recent ambulatory BPs. Recommend to monitor BP at home. See instructions

## 2012-02-19 NOTE — Assessment & Plan Note (Signed)
History of noncardiac chest pain, symptoms well-controlled with Prilosec. I recommend the patient to take Prilosec daily for now. He has a prescription unfortunately his insurance will cover only 90 days out off every 6 months. We'll have to get OTC Prilosec as needed
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
## 2012-02-19 NOTE — Assessment & Plan Note (Signed)
On no HRT, the patient decided not to pursue endocrinology evaluation. Plan: order a bone density test.

## 2012-02-20 ENCOUNTER — Encounter: Payer: Self-pay | Admitting: Internal Medicine

## 2012-03-06 ENCOUNTER — Encounter: Payer: Self-pay | Admitting: Internal Medicine

## 2012-03-06 ENCOUNTER — Ambulatory Visit (INDEPENDENT_AMBULATORY_CARE_PROVIDER_SITE_OTHER): Payer: BC Managed Care – PPO | Admitting: Internal Medicine

## 2012-03-06 ENCOUNTER — Telehealth: Payer: Self-pay | Admitting: Internal Medicine

## 2012-03-06 VITALS — BP 130/78 | HR 75 | Temp 98.1°F | Wt 194.0 lb

## 2012-03-06 DIAGNOSIS — J40 Bronchitis, not specified as acute or chronic: Secondary | ICD-10-CM

## 2012-03-06 MED ORDER — DOXYCYCLINE HYCLATE 100 MG PO TABS: 100.0000 mg | ORAL_TABLET | Freq: Two times a day (BID) | ORAL | Status: AC

## 2012-03-06 MED ORDER — AZELASTINE HCL 0.1 % NA SOLN: 2.0000 | Freq: Two times a day (BID) | NASAL | Status: DC

## 2012-03-06 NOTE — Telephone Encounter (Signed)
Has an appointment to see me today
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
## 2012-03-06 NOTE — Telephone Encounter (Signed)
Caller: Devin Martin/Patient; PCP: Willow Ora; CB#: [phone]; ; ; Call regarding Cough/Congestion;  Onset- 03/05/2012 Afebrile. Pt c/o of productive cough with white phlegm and congestion. States he had this before and was given an antibiotic. He does not want to come in for an appt to get a rx. Pt is aware of policy. Emergent s/s of Cough protocol r/o. Pt to see provider within 24hrs. States he has been using  Mucinex but it does not work.  Pharmacy is  [facility]. Pt is requesting a message be sent first to see if antibiotic or something for cough  can be sent in, and if not please let him know so he can try to schedule today to get this taken care of today.

## 2012-03-06 NOTE — Patient Instructions (Addendum)
Rest, fluids , tylenol For cough, take Mucinex DM twice a day as needed  For congestion use astelin nasal spray twice a day until you feel better Take the antibiotic as prescribed  (doxycycline ) Call if no better in few days Call anytime if the symptoms are severe  

## 2012-03-09 ENCOUNTER — Ambulatory Visit: Payer: BC Managed Care – PPO | Attending: Orthopedic Surgery | Admitting: Physical Therapy

## 2012-03-09 DIAGNOSIS — M25559 Pain in unspecified hip: Secondary | ICD-10-CM | POA: Insufficient documentation

## 2012-03-09 DIAGNOSIS — IMO0001 Reserved for inherently not codable concepts without codable children: Secondary | ICD-10-CM | POA: Insufficient documentation

## 2012-03-09 DIAGNOSIS — S88119A Complete traumatic amputation at level between knee and ankle, unspecified lower leg, initial encounter: Secondary | ICD-10-CM | POA: Insufficient documentation

## 2012-03-11 ENCOUNTER — Ambulatory Visit: Payer: BC Managed Care – PPO | Admitting: Physical Therapy

## 2012-03-12 ENCOUNTER — Ambulatory Visit: Payer: BC Managed Care – PPO | Admitting: Physical Therapy

## 2012-03-17 ENCOUNTER — Ambulatory Visit: Payer: BC Managed Care – PPO | Admitting: Physical Therapy

## 2012-03-19 ENCOUNTER — Ambulatory Visit: Payer: BC Managed Care – PPO | Admitting: Physical Therapy

## 2012-03-26 ENCOUNTER — Other Ambulatory Visit: Payer: BC Managed Care – PPO

## 2012-04-27 ENCOUNTER — Other Ambulatory Visit: Payer: Self-pay | Admitting: Cardiology

## 2012-04-29 ENCOUNTER — Encounter: Payer: Self-pay | Admitting: Cardiovascular Disease

## 2012-08-01 ENCOUNTER — Other Ambulatory Visit: Payer: Self-pay | Admitting: Cardiology

## 2012-08-03 ENCOUNTER — Telehealth: Payer: Self-pay | Admitting: *Deleted

## 2012-08-03 NOTE — Telephone Encounter (Signed)
Pt requesting a Rx prosthetic liner. To be Fax to advance prosthetic attention: Wallene Dales [phone]

## 2012-08-03 NOTE — Telephone Encounter (Signed)
Ok to do, dx below knee amputation

## 2012-08-03 NOTE — Telephone Encounter (Signed)
Faxed rx

## 2012-09-01 ENCOUNTER — Ambulatory Visit (INDEPENDENT_AMBULATORY_CARE_PROVIDER_SITE_OTHER): Payer: BC Managed Care – PPO | Admitting: Internal Medicine

## 2012-09-01 ENCOUNTER — Encounter: Payer: Self-pay | Admitting: Internal Medicine

## 2012-09-01 VITALS — BP 118/68 | HR 73 | Temp 97.6°F | Wt 199.0 lb

## 2012-09-01 DIAGNOSIS — L723 Sebaceous cyst: Secondary | ICD-10-CM | POA: Insufficient documentation

## 2012-09-01 DIAGNOSIS — Z9089 Acquired absence of other organs: Secondary | ICD-10-CM

## 2012-09-01 DIAGNOSIS — K219 Gastro-esophageal reflux disease without esophagitis: Secondary | ICD-10-CM

## 2012-09-01 MED ORDER — DEXLANSOPRAZOLE 30 MG PO CPDR: 30.0000 mg | DELAYED_RELEASE_CAPSULE | Freq: Every day | ORAL | Status: DC

## 2012-09-01 NOTE — Assessment & Plan Note (Addendum)
Sebaceous cyst is causing mechanical discomfort, refer to surgery for consideration of excision
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
## 2012-09-01 NOTE — Patient Instructions (Addendum)
Schedule a physical exam at your earliest convenience

## 2012-09-01 NOTE — Assessment & Plan Note (Signed)
Symptoms are well controlled with Prilosec. He is also on Plavix. Plan: Discontinue Prilosec, start dexilant

## 2012-09-02 ENCOUNTER — Telehealth: Payer: Self-pay | Admitting: *Deleted

## 2012-09-02 MED ORDER — OMEPRAZOLE 20 MG PO CPDR: 20.0000 mg | DELAYED_RELEASE_CAPSULE | Freq: Every day | ORAL | Status: DC

## 2012-09-02 NOTE — Telephone Encounter (Signed)
Pt called stating that his insurance will not cover Dexlansoprazole 30mg  & is requiring a prior auth. Pt states that he was previously taking Prilosec but was told that it had an interaction with Plavix. Pt states that he is no longer taking Plavix & has not taken it in 1 year. Pt would like to know if he can go back to Prilosec or does a prior auth need to be completed for Dexlansopraole? Please advise.

## 2012-09-02 NOTE — Telephone Encounter (Signed)
If he's not taking Plavix, okay to go back to omeprazole 20 mg one by mouth daily #90 and 3 refills. Please discontinue Plavix from his med list

## 2012-09-02 NOTE — Telephone Encounter (Signed)
Discussed with pt, he states that he is no longer taking plavix. Sent omeprazole 20mg  to pharmacy.

## 2012-09-03 ENCOUNTER — Encounter (INDEPENDENT_AMBULATORY_CARE_PROVIDER_SITE_OTHER): Payer: Self-pay | Admitting: General Surgery

## 2012-09-03 ENCOUNTER — Encounter (HOSPITAL_BASED_OUTPATIENT_CLINIC_OR_DEPARTMENT_OTHER): Payer: Self-pay | Admitting: *Deleted

## 2012-09-03 ENCOUNTER — Ambulatory Visit (INDEPENDENT_AMBULATORY_CARE_PROVIDER_SITE_OTHER): Payer: BC Managed Care – PPO | Admitting: General Surgery

## 2012-09-03 VITALS — BP 123/80 | HR 75 | Temp 97.7°F | Resp 12 | Ht 64.0 in | Wt 196.0 lb

## 2012-09-03 DIAGNOSIS — L723 Sebaceous cyst: Secondary | ICD-10-CM

## 2012-09-03 NOTE — Progress Notes (Signed)
Pt had stent in 2005 and 2011-off plavix Sees dr Peggyann Juba visit was good-last ef66% No chest pain or problems in 3 yr Labs done 08/12/12
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
## 2012-09-04 ENCOUNTER — Encounter (HOSPITAL_BASED_OUTPATIENT_CLINIC_OR_DEPARTMENT_OTHER): Payer: Self-pay | Admitting: Certified Registered"

## 2012-09-04 ENCOUNTER — Encounter (HOSPITAL_BASED_OUTPATIENT_CLINIC_OR_DEPARTMENT_OTHER)
Admission: RE | Disposition: A | Discharge status: Home / Self Care | Payer: Self-pay | Source: Ambulatory Visit | Attending: General Surgery

## 2012-09-04 ENCOUNTER — Ambulatory Visit (HOSPITAL_BASED_OUTPATIENT_CLINIC_OR_DEPARTMENT_OTHER): Payer: BC Managed Care – PPO | Admitting: Certified Registered"

## 2012-09-04 ENCOUNTER — Encounter (HOSPITAL_BASED_OUTPATIENT_CLINIC_OR_DEPARTMENT_OTHER): Payer: Self-pay | Admitting: *Deleted

## 2012-09-04 ENCOUNTER — Ambulatory Visit (HOSPITAL_BASED_OUTPATIENT_CLINIC_OR_DEPARTMENT_OTHER)
Admission: RE | Admit: 2012-09-04 | Discharge: 2012-09-04 | Disposition: A | Discharge status: Home / Self Care | Payer: BC Managed Care – PPO | Source: Ambulatory Visit | Attending: General Surgery | Admitting: General Surgery

## 2012-09-04 DIAGNOSIS — I2510 Atherosclerotic heart disease of native coronary artery without angina pectoris: Secondary | ICD-10-CM | POA: Insufficient documentation

## 2012-09-04 DIAGNOSIS — I252 Old myocardial infarction: Secondary | ICD-10-CM | POA: Insufficient documentation

## 2012-09-04 DIAGNOSIS — L723 Sebaceous cyst: Secondary | ICD-10-CM

## 2012-09-04 DIAGNOSIS — I10 Essential (primary) hypertension: Secondary | ICD-10-CM | POA: Insufficient documentation

## 2012-09-04 DIAGNOSIS — G4730 Sleep apnea, unspecified: Secondary | ICD-10-CM | POA: Insufficient documentation

## 2012-09-04 HISTORY — DX: Acute myocardial infarction, unspecified: I21.9

## 2012-09-04 HISTORY — PX: CYST REMOVAL TRUNK: SHX6283

## 2012-09-04 HISTORY — DX: Presence of spectacles and contact lenses: Z97.3

## 2012-09-04 HISTORY — DX: Unspecified osteoarthritis, unspecified site: M19.90

## 2012-09-04 LAB — POCT HEMOGLOBIN-HEMACUE: Hemoglobin: 12.8 g/dL — ABNORMAL LOW (ref 13.0–17.0)

## 2012-09-04 SURGERY — CYST REMOVAL TRUNK
Anesthesia: Monitor Anesthesia Care | Site: Back | Wound class: Clean

## 2012-09-04 MED ORDER — SODIUM CHLORIDE 0.9 % IJ SOLN: 3.0000 mL | Freq: Two times a day (BID) | INTRAMUSCULAR | Status: DC

## 2012-09-04 MED ORDER — 0.9 % SODIUM CHLORIDE (POUR BTL) OPTIME
TOPICAL | Status: DC | PRN
  Administered 2012-09-04: 1000 mL

## 2012-09-04 MED ORDER — VANCOMYCIN HCL IN DEXTROSE 1-5 GM/200ML-% IV SOLN
1000.0000 mg | Freq: Once | INTRAVENOUS | Status: AC
  Administered 2012-09-04: 1000 mg via INTRAVENOUS

## 2012-09-04 MED ORDER — LACTATED RINGERS IV SOLN
INTRAVENOUS | Status: DC
  Administered 2012-09-04: 07:00:00 via INTRAVENOUS

## 2012-09-04 MED ORDER — HYDROMORPHONE HCL PF 1 MG/ML IJ SOLN: 0.2500 mg | INTRAMUSCULAR | Status: DC | PRN

## 2012-09-04 MED ORDER — SODIUM CHLORIDE 0.9 % IJ SOLN: 3.0000 mL | INTRAMUSCULAR | Status: DC | PRN

## 2012-09-04 MED ORDER — FENTANYL CITRATE 0.05 MG/ML IJ SOLN
INTRAMUSCULAR | Status: DC | PRN
  Administered 2012-09-04: 100 ug via INTRAVENOUS

## 2012-09-04 MED ORDER — MORPHINE SULFATE 2 MG/ML IJ SOLN: 2.0000 mg | INTRAMUSCULAR | Status: DC | PRN

## 2012-09-04 MED ORDER — PROPOFOL 10 MG/ML IV EMUL
INTRAVENOUS | Status: DC | PRN
  Administered 2012-09-04: 100 ug/kg/min via INTRAVENOUS

## 2012-09-04 MED ORDER — HYDROCODONE-ACETAMINOPHEN 5-500 MG PO TABS: 1.0000 | ORAL_TABLET | Freq: Four times a day (QID) | ORAL | Status: DC | PRN

## 2012-09-04 MED ORDER — ONDANSETRON HCL 4 MG/2ML IJ SOLN: 4.0000 mg | Freq: Four times a day (QID) | INTRAMUSCULAR | Status: DC | PRN

## 2012-09-04 MED ORDER — ONDANSETRON HCL 4 MG/2ML IJ SOLN
INTRAMUSCULAR | Status: DC | PRN
  Administered 2012-09-04: 4 mg via INTRAVENOUS

## 2012-09-04 MED ORDER — LIDOCAINE HCL (CARDIAC) 20 MG/ML IV SOLN
INTRAVENOUS | Status: DC | PRN
  Administered 2012-09-04: 40 mg via INTRAVENOUS

## 2012-09-04 MED ORDER — MIDAZOLAM HCL 5 MG/5ML IJ SOLN
INTRAMUSCULAR | Status: DC | PRN
  Administered 2012-09-04: 2 mg via INTRAVENOUS

## 2012-09-04 MED ORDER — SODIUM CHLORIDE 0.9 % IV SOLN: 250.0000 mL | INTRAVENOUS | Status: DC | PRN

## 2012-09-04 MED ORDER — BUPIVACAINE-EPINEPHRINE 0.25% -1:200000 IJ SOLN
INTRAMUSCULAR | Status: DC | PRN
  Administered 2012-09-04: 26 mL

## 2012-09-04 SURGICAL SUPPLY — 54 items
ADH SKN CLS APL DERMABOND .7 (GAUZE/BANDAGES/DRESSINGS) ×1
APL SKNCLS STERI-STRIP NONHPOA (GAUZE/BANDAGES/DRESSINGS)
APPLIER CLIP 9.375 MED OPEN (MISCELLANEOUS)
APR CLP MED 9.3 20 MLT OPN (MISCELLANEOUS)
BENZOIN TINCTURE PRP APPL 2/3 (GAUZE/BANDAGES/DRESSINGS) ×1 IMPLANT
BLADE SURG 15 STRL LF DISP TIS (BLADE) ×1 IMPLANT
BLADE SURG 15 STRL SS (BLADE) ×2
CANISTER SUCTION 1200CC (MISCELLANEOUS) ×1 IMPLANT
CHLORAPREP W/TINT 26ML (MISCELLANEOUS) ×2 IMPLANT
CLIP APPLIE 9.375 MED OPEN (MISCELLANEOUS) IMPLANT
CLOTH BEACON ORANGE TIMEOUT ST (SAFETY) ×2 IMPLANT
COVER MAYO STAND STRL (DRAPES) ×2 IMPLANT
COVER TABLE BACK 60X90 (DRAPES) ×2 IMPLANT
DECANTER SPIKE VIAL GLASS SM (MISCELLANEOUS) IMPLANT
DERMABOND ADVANCED (GAUZE/BANDAGES/DRESSINGS) ×1
DERMABOND ADVANCED .7 DNX12 (GAUZE/BANDAGES/DRESSINGS) IMPLANT
DRAPE PED LAPAROTOMY (DRAPES) ×2 IMPLANT
DRAPE UTILITY XL STRL (DRAPES) ×1 IMPLANT
DRSG TEGADERM 4X4.75 (GAUZE/BANDAGES/DRESSINGS) ×1 IMPLANT
ELECT COATED BLADE 2.86 ST (ELECTRODE) ×2 IMPLANT
ELECT REM PT RETURN 9FT ADLT (ELECTROSURGICAL) ×2
ELECTRODE REM PT RTRN 9FT ADLT (ELECTROSURGICAL) ×1 IMPLANT
GAUZE SPONGE 4X4 12PLY STRL LF (GAUZE/BANDAGES/DRESSINGS) ×1 IMPLANT
GLOVE BIO SURGEON STRL SZ7.5 (GLOVE) ×2 IMPLANT
GLOVE BIOGEL PI IND STRL 7.0 (GLOVE) IMPLANT
GLOVE BIOGEL PI IND STRL 8 (GLOVE) ×1 IMPLANT
GLOVE BIOGEL PI INDICATOR 7.0 (GLOVE) ×1
GLOVE BIOGEL PI INDICATOR 8 (GLOVE) ×1
GLOVE ECLIPSE 6.5 STRL STRAW (GLOVE) ×2 IMPLANT
GOWN PREVENTION PLUS XLARGE (GOWN DISPOSABLE) ×4 IMPLANT
NDL HYPO 25X1 1.5 SAFETY (NEEDLE) ×1 IMPLANT
NEEDLE HYPO 25X1 1.5 SAFETY (NEEDLE) ×2 IMPLANT
NS IRRIG 1000ML POUR BTL (IV SOLUTION) ×1 IMPLANT
PACK BASIN DAY SURGERY FS (CUSTOM PROCEDURE TRAY) ×2 IMPLANT
PENCIL BUTTON HOLSTER BLD 10FT (ELECTRODE) ×2 IMPLANT
SLEEVE SCD COMPRESS KNEE MED (MISCELLANEOUS) ×1 IMPLANT
SPONGE LAP 4X18 X RAY DECT (DISPOSABLE) ×2 IMPLANT
STRIP CLOSURE SKIN 1/2X4 (GAUZE/BANDAGES/DRESSINGS) ×2 IMPLANT
SUCTION FRAZIER TIP 10 FR DISP (SUCTIONS) ×1 IMPLANT
SUT MNCRL AB 3-0 PS2 18 (SUTURE) IMPLANT
SUT MNCRL AB 4-0 PS2 18 (SUTURE) IMPLANT
SUT SILK 2 0 SH (SUTURE) ×2 IMPLANT
SUT VIC AB 2-0 SH 27 (SUTURE)
SUT VIC AB 2-0 SH 27XBRD (SUTURE) ×1 IMPLANT
SUT VIC AB 3-0 SH 27 (SUTURE) ×4
SUT VIC AB 3-0 SH 27X BRD (SUTURE) ×1 IMPLANT
SUT VIC AB 5-0 PS2 18 (SUTURE) IMPLANT
SUT VICRYL AB 3 0 TIES (SUTURE) IMPLANT
SYR BULB 3OZ (MISCELLANEOUS) ×1 IMPLANT
SYR CONTROL 10ML LL (SYRINGE) ×2 IMPLANT
TOWEL OR 17X24 6PK STRL BLUE (TOWEL DISPOSABLE) ×2 IMPLANT
TOWEL OR NON WOVEN STRL DISP B (DISPOSABLE) ×1 IMPLANT
TUBE CONNECTING 20X1/4 (TUBING) ×1 IMPLANT
YANKAUER SUCT BULB TIP NO VENT (SUCTIONS) IMPLANT

## 2012-09-04 NOTE — Anesthesia Procedure Notes (Signed)
Procedure Name: MAC Date/Time: 09/04/2012 7:46 AM Performed by: Verlan Friends Pre-anesthesia Checklist: Patient identified, Emergency Drugs available, Suction available, Patient being monitored and Timeout performed Patient Re-evaluated:Patient Re-evaluated prior to inductionOxygen Delivery Method: Simple face mask Placement Confirmation: positive ETCO2

## 2012-09-04 NOTE — Transfer of Care (Signed)
Immediate Anesthesia Transfer of Care Note  Patient: Devin Martin  Procedure(s) Performed: Procedure(s) (LRB) with comments: CYST REMOVAL TRUNK (N/A) - EXCISION OF MID BACK MASS  Patient Location: PACU  Anesthesia Type:MAC  Level of Consciousness: awake, alert , oriented and patient cooperative  Airway & Oxygen Therapy: Patient Spontanous Breathing and Patient connected to face mask oxygen  Post-op Assessment: Report given to PACU RN and Post -op Vital signs reviewed and stable  Post vital signs: Reviewed and stable  Complications: No apparent anesthesia complications

## 2012-09-04 NOTE — Anesthesia Postprocedure Evaluation (Signed)
  Anesthesia Post-op Note  Patient: Devin Martin  Procedure(s) Performed: Procedure(s) (LRB) with comments: CYST REMOVAL TRUNK (N/A) - EXCISION OF MID BACK MASS  Patient Location: PACU  Anesthesia Type:MAC  Level of Consciousness: awake, alert  and oriented  Airway and Oxygen Therapy: Patient Spontanous Breathing  Post-op Pain: none  Post-op Assessment: Post-op Vital signs reviewed, Patient's Cardiovascular Status Stable, Respiratory Function Stable, Patent Airway and No signs of Nausea or vomiting  Post-op Vital Signs: Reviewed and stable  Complications: No apparent anesthesia complications

## 2012-09-04 NOTE — Op Note (Signed)
Pre Operative Diagnosis:  Mid back cyst  Post Operative Diagnosis: same  Procedure: excision of subcutaneous back cyst 3x4cm  Surgeon: Dr. Axel Filler  Assistant: none  Anesthesia: local/mac  EBL: <5cc  Complications: none  Counts: reported as correct x 2  Findings:  3x4cm back cyst  Specimen: 3x4cm back cyst  Indications for procedure:  Pt is s 67 y/o M with a mid back cyst for several months that has been getting bigger and bothersome.  Pt was seen in clinic and wished to have it removed electively.  Details of the procedure:The patient was taken back to the operating room. The patient was placed in supine position with bilateral SCDs in place. After appropriate anitbiotics were confirmed, a time-out was confirmed and all facts were verified.  A 3cm incision was made over the cyst.  Bovie cautery was used to maintain hemostatsis and dissection was carried down to the cyst. This was circumfrentially dissected and the cyst was excised.  The area was irrigated out with sterile saline.  A 3-0 Vicryl was used to reapproximate the deep dermal layer, and a 4-0 monocryl was used in a subcuticular fashion to reapproximate the skin.  The skin was then dressed there Dermabond.  The patient was then taken to the recovery room in stable condition.

## 2012-09-04 NOTE — Interval H&P Note (Signed)
History and Physical Interval Note:  09/04/2012 7:28 AM  Devin Martin  has presented today for surgery, with the diagnosis of BACK SEBACEUOS CYST  The various methods of treatment have been discussed with the patient and family. After consideration of risks, benefits and other options for treatment, the patient has consented to  Procedure(s) (LRB) with comments: CYST REMOVAL TRUNK (N/A) - EXCISION OF MID BACK MASS as a surgical intervention .  The patient's history has been reviewed, patient examined, no change in status, stable for surgery.  I have reviewed the patient's chart and labs.  Questions were answered to the patient's satisfaction.     Marigene Ehlers., Jed Limerick

## 2012-09-04 NOTE — Anesthesia Preprocedure Evaluation (Signed)
Anesthesia Evaluation  Patient identified by MRN, date of birth, ID band Patient awake    Reviewed: Allergy & Precautions, H&P , NPO status , Patient's Chart, lab work & pertinent test results  Airway Mallampati: II TM Distance: >3 FB Neck ROM: Full    Dental No notable dental hx. (+) Teeth Intact and Dental Advisory Given   Pulmonary sleep apnea and Continuous Positive Airway Pressure Ventilation ,  breath sounds clear to auscultation  Pulmonary exam normal       Cardiovascular hypertension, On Medications + CAD and + Past MI Rhythm:Regular Rate:Normal     Neuro/Psych negative neurological ROS  negative psych ROS   GI/Hepatic Neg liver ROS, Medicated and Controlled,  Endo/Other  negative endocrine ROS  Renal/GU negative Renal ROS  negative genitourinary   Musculoskeletal   Abdominal   Peds  Hematology negative hematology ROS (+)   Anesthesia Other Findings   Reproductive/Obstetrics negative OB ROS                           Anesthesia Physical Anesthesia Plan  ASA: III  Anesthesia Plan: MAC   Post-op Pain Management:    Induction: Intravenous  Airway Management Planned: Simple Face Mask  Additional Equipment:   Intra-op Plan:   Post-operative Plan:   Informed Consent: I have reviewed the patients History and Physical, chart, labs and discussed the procedure including the risks, benefits and alternatives for the proposed anesthesia with the patient or authorized representative who has indicated his/her understanding and acceptance.   Dental advisory given  Plan Discussed with: CRNA  Anesthesia Plan Comments:         Anesthesia Quick Evaluation
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
## 2012-09-07 ENCOUNTER — Encounter (HOSPITAL_BASED_OUTPATIENT_CLINIC_OR_DEPARTMENT_OTHER): Payer: Self-pay | Admitting: General Surgery

## 2012-09-07 ENCOUNTER — Ambulatory Visit (INDEPENDENT_AMBULATORY_CARE_PROVIDER_SITE_OTHER): Payer: Self-pay | Admitting: General Surgery

## 2012-09-08 ENCOUNTER — Ambulatory Visit (INDEPENDENT_AMBULATORY_CARE_PROVIDER_SITE_OTHER): Payer: BC Managed Care – PPO | Admitting: General Surgery

## 2012-09-08 ENCOUNTER — Encounter (INDEPENDENT_AMBULATORY_CARE_PROVIDER_SITE_OTHER): Payer: Self-pay | Admitting: General Surgery

## 2012-09-08 VITALS — BP 132/76 | HR 74 | Temp 98.4°F | Resp 18 | Ht 65.0 in | Wt 199.0 lb

## 2012-09-08 DIAGNOSIS — Z09 Encounter for follow-up examination after completed treatment for conditions other than malignant neoplasm: Secondary | ICD-10-CM

## 2012-09-08 NOTE — Progress Notes (Signed)
Patient ID: Devin Martin, male   DOB: 1946-05-23, 68 y.o.   MRN: [id number] The patient is a 67 year old male status post back mass excision. Patient area of wound dehiscence that was identified. Due to no drainage erythema and area. Pathology revealed epidermal inclusion cyst. This was discussed with the patient.  On exam: Wound is clean dry and intact there is no drainage erythema and a small amount of wound dehiscence in the midportion of the wound.  Assessment and plan: Status post back mass excision  Patient will follow up in 2 weeks.

## 2012-09-10 ENCOUNTER — Telehealth (INDEPENDENT_AMBULATORY_CARE_PROVIDER_SITE_OTHER): Payer: Self-pay | Admitting: General Surgery

## 2012-09-10 NOTE — Telephone Encounter (Signed)
Pt called to report that incision on back after cyst removal has started draining 1-2 days ago. No blood noted or fever reported.The drainage comes through t-shirt and outer shirt. Dr. Derrell Lolling gave instruction for pt to apply gauze dressing to absorb drainage. I told him to monitor how many times he has to change dressing in one day. Pt stated that originally area was puffy and now it is flat after drainage started. Pt will call office to update drainage if it increases, or other symptoms develop/gy

## 2012-09-17 ENCOUNTER — Encounter (INDEPENDENT_AMBULATORY_CARE_PROVIDER_SITE_OTHER): Payer: BC Managed Care – PPO | Admitting: General Surgery

## 2012-09-18 ENCOUNTER — Telehealth (INDEPENDENT_AMBULATORY_CARE_PROVIDER_SITE_OTHER): Payer: Self-pay

## 2012-09-18 ENCOUNTER — Encounter (INDEPENDENT_AMBULATORY_CARE_PROVIDER_SITE_OTHER): Payer: Self-pay | Admitting: General Surgery

## 2012-09-18 ENCOUNTER — Ambulatory Visit (INDEPENDENT_AMBULATORY_CARE_PROVIDER_SITE_OTHER): Payer: BC Managed Care – PPO | Admitting: General Surgery

## 2012-09-18 VITALS — BP 122/84 | HR 66 | Temp 96.9°F | Resp 18 | Ht 65.0 in | Wt 196.8 lb

## 2012-09-18 DIAGNOSIS — R222 Localized swelling, mass and lump, trunk: Secondary | ICD-10-CM

## 2012-09-18 DIAGNOSIS — R229 Localized swelling, mass and lump, unspecified: Secondary | ICD-10-CM

## 2012-09-18 NOTE — Telephone Encounter (Signed)
Pt called stating his back wound is draining large amt of serous fluid and skin edges have opened. Pt given appt today for wound to be checked.

## 2012-09-18 NOTE — Progress Notes (Signed)
Patient ID: Devin Martin, male   DOB: 01-Mar-1946, 67 y.o.   MRN: [id number] 67 year old male status post bypass excision. Patient had a skin dehiscence in the area has been draining some serous fluid. There's been no purulence no fevers.  On exam:  Wounds clean dry and intact with granulation base, purulence expressed.  Assessment and plan Patient continue with wound dressing changes.  Followup in 2 weeks.

## 2012-09-24 ENCOUNTER — Encounter (INDEPENDENT_AMBULATORY_CARE_PROVIDER_SITE_OTHER): Payer: BC Managed Care – PPO | Admitting: General Surgery

## 2012-10-01 ENCOUNTER — Ambulatory Visit (INDEPENDENT_AMBULATORY_CARE_PROVIDER_SITE_OTHER): Payer: BC Managed Care – PPO | Admitting: General Surgery

## 2012-10-01 ENCOUNTER — Encounter (INDEPENDENT_AMBULATORY_CARE_PROVIDER_SITE_OTHER): Payer: Self-pay | Admitting: General Surgery

## 2012-10-01 VITALS — BP 140/84 | HR 75 | Temp 98.0°F | Ht 65.0 in | Wt 197.0 lb

## 2012-10-01 DIAGNOSIS — R229 Localized swelling, mass and lump, unspecified: Secondary | ICD-10-CM

## 2012-10-01 DIAGNOSIS — R222 Localized swelling, mass and lump, trunk: Secondary | ICD-10-CM

## 2012-10-01 NOTE — Progress Notes (Signed)
Patient ID: Devin Martin, male   DOB: [birth year], 67 y.o.   MRN: [id number] The patient is a 67 year old male status post excision. Patient area of skin dehiscence. We've been following this over the last several weeks.  The area has continued to granulate and 2 more shallow. No drainage.   On exam: Wounds clean dry and intact, there was tissue at the base  Assessment and plan: Status post neck mass excision. The patient follow up when necessary The patient okay to continue working out.

## 2012-10-28 ENCOUNTER — Other Ambulatory Visit: Payer: Self-pay | Admitting: *Deleted

## 2012-10-28 MED ORDER — DILTIAZEM HCL ER BEADS 360 MG PO CP24: 360.0000 mg | ORAL_CAPSULE | Freq: Every day | ORAL | Status: DC

## 2012-12-22 ENCOUNTER — Other Ambulatory Visit: Payer: Self-pay | Admitting: *Deleted

## 2012-12-22 MED ORDER — NITROGLYCERIN 0.4 MG SL SUBL: 0.4000 mg | SUBLINGUAL_TABLET | SUBLINGUAL | Status: DC | PRN

## 2012-12-22 NOTE — Telephone Encounter (Signed)
pt calling to get refills. called pt back and meds has been filled. adviced pt to make 1 yr ov with dr.crenshaw and pt states he will call office back to make that appt after he see his PCP.  Need appointment. Fax Received. Refill Completed. Yolinda Duerr Chowoe (R.M.A)

## 2013-01-29 ENCOUNTER — Encounter: Payer: Self-pay | Admitting: Internal Medicine

## 2013-01-29 ENCOUNTER — Other Ambulatory Visit: Payer: BC Managed Care – PPO

## 2013-01-29 ENCOUNTER — Ambulatory Visit (INDEPENDENT_AMBULATORY_CARE_PROVIDER_SITE_OTHER): Payer: Medicare Other | Admitting: Internal Medicine

## 2013-01-29 VITALS — BP 124/76 | HR 46 | Temp 98.1°F | Ht 65.0 in | Wt 195.0 lb

## 2013-01-29 DIAGNOSIS — Z9089 Acquired absence of other organs: Secondary | ICD-10-CM

## 2013-01-29 DIAGNOSIS — Z23 Encounter for immunization: Secondary | ICD-10-CM

## 2013-01-29 DIAGNOSIS — E785 Hyperlipidemia, unspecified: Secondary | ICD-10-CM

## 2013-01-29 DIAGNOSIS — I10 Essential (primary) hypertension: Secondary | ICD-10-CM

## 2013-01-29 DIAGNOSIS — Z125 Encounter for screening for malignant neoplasm of prostate: Secondary | ICD-10-CM

## 2013-01-29 DIAGNOSIS — G4733 Obstructive sleep apnea (adult) (pediatric): Secondary | ICD-10-CM

## 2013-01-29 DIAGNOSIS — I2510 Atherosclerotic heart disease of native coronary artery without angina pectoris: Secondary | ICD-10-CM

## 2013-01-29 DIAGNOSIS — Z0000 Encounter for general adult medical examination without abnormal findings: Secondary | ICD-10-CM

## 2013-01-29 DIAGNOSIS — E291 Testicular hypofunction: Secondary | ICD-10-CM

## 2013-01-29 LAB — LIPID PANEL
LDL Cholesterol: 42 mg/dL (ref 0–99)
VLDL: 13 mg/dL (ref 0–40)

## 2013-01-29 LAB — CBC WITH DIFFERENTIAL/PLATELET
Basophils Relative: 1 % (ref 0–1)
Hemoglobin: 14.8 g/dL (ref 13.0–17.0)
Lymphs Abs: 1.9 10*3/uL (ref 0.7–4.0)
Monocytes Relative: 9 % (ref 3–12)
Neutro Abs: 2 10*3/uL (ref 1.7–7.7)
Neutrophils Relative %: 44 % (ref 43–77)
Platelets: 229 10*3/uL (ref 150–400)
RBC: 4.9 MIL/uL (ref 4.22–5.81)

## 2013-01-29 LAB — COMPREHENSIVE METABOLIC PANEL
ALT: 31 U/L (ref 0–53)
AST: 22 U/L (ref 0–37)
Alkaline Phosphatase: 63 U/L (ref 39–117)
CO2: 25 mEq/L (ref 19–32)
Sodium: 139 mEq/L (ref 135–145)
Total Bilirubin: 0.7 mg/dL (ref 0.3–1.2)
Total Protein: 6.3 g/dL (ref 6.0–8.3)

## 2013-01-29 NOTE — Assessment & Plan Note (Signed)
Due for labs, BP good today

## 2013-01-29 NOTE — Assessment & Plan Note (Addendum)
dexa apparently not done, reschedule Likes testosterone check but no sure if likes treatment

## 2013-01-29 NOTE — Assessment & Plan Note (Addendum)
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   Counseled about  nicotine use, rec against it

## 2013-01-29 NOTE — Assessment & Plan Note (Signed)
Plan: Cardiovascular risk factors control. Occ uses diclofenac, recommend against  routine use of such medication
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
## 2013-01-29 NOTE — Patient Instructions (Addendum)
Trying to eat healthy, small snack between meals. Check the  blood pressure 2 or 3 times a week, be sure it is between 110/60 and 140/85. If it is consistently higher or lower, let me know  Fall Prevention and Home Safety Falls cause injuries and can affect all age groups. It is possible to use preventive measures to significantly decrease the likelihood of falls. There are many simple measures which can make your home safer and prevent falls. OUTDOORS  Repair cracks and edges of walkways and driveways.  Remove high doorway thresholds.  Trim shrubbery on the main path into your home.  Have good outside lighting.  Clear walkways of tools, rocks, debris, and clutter.  Check that handrails are not broken and are securely fastened. Both sides of steps should have handrails.  Have leaves, snow, and ice cleared regularly.  Use sand or salt on walkways during winter months.  In the garage, clean up grease or oil spills. BATHROOM  Install night lights.  Install grab bars by the toilet and in the tub and shower.  Use non-skid mats or decals in the tub or shower.  Place a plastic non-slip stool in the shower to sit on, if needed.  Keep floors dry and clean up all water on the floor immediately.  Remove soap buildup in the tub or shower on a regular basis.  Secure bath mats with non-slip, double-sided rug tape.  Remove throw rugs and tripping hazards from the floors. BEDROOMS  Install night lights.  Make sure a bedside light is easy to reach.  Do not use oversized bedding.  Keep a telephone by your bedside.  Have a firm chair with side arms to use for getting dressed.  Remove throw rugs and tripping hazards from the floor. KITCHEN  Keep handles on pots and pans turned toward the center of the stove. Use back burners when possible.  Clean up spills quickly and allow time for drying.  Avoid walking on wet floors.  Avoid hot utensils and knives.  Position shelves so  they are not too high or low.  Place commonly used objects within easy reach.  If necessary, use a sturdy step stool with a grab bar when reaching.  Keep electrical cables out of the way.  Do not use floor polish or wax that makes floors slippery. If you must use wax, use non-skid floor wax.  Remove throw rugs and tripping hazards from the floor. STAIRWAYS  Never leave objects on stairs.  Place handrails on both sides of stairways and use them. Fix any loose handrails. Make sure handrails on both sides of the stairways are as long as the stairs.  Check carpeting to make sure it is firmly attached along stairs. Make repairs to worn or loose carpet promptly.  Avoid placing throw rugs at the top or bottom of stairways, or properly secure the rug with carpet tape to prevent slippage. Get rid of throw rugs, if possible.  Have an electrician put in a light switch at the top and bottom of the stairs. OTHER FALL PREVENTION TIPS  Wear low-heel or rubber-soled shoes that are supportive and fit well. Wear closed toe shoes.  When using a stepladder, make sure it is fully opened and both spreaders are firmly locked. Do not climb a closed stepladder.  Add color or contrast paint or tape to grab bars and handrails in your home. Place contrasting color strips on first and last steps.  Learn and use mobility aids as needed. Install  an electrical emergency response system.  Turn on lights to avoid dark areas. Replace light bulbs that burn out immediately. Get light switches that glow.  Arrange furniture to create clear pathways. Keep furniture in the same place.  Firmly attach carpet with non-skid or double-sided tape.  Eliminate uneven floor surfaces.  Select a carpet pattern that does not visually hide the edge of steps.  Be aware of all pets. OTHER HOME SAFETY TIPS  Set the water temperature for 120 F (48.8 C).  Keep emergency numbers on or near the telephone.  Keep smoke  detectors on every level of the home and near sleeping areas. Document Released: 08/02/2002 Document Revised: 02/11/2012 Document Reviewed: 11/01/2011 [facility] Patient Information 2014 [place], [place].

## 2013-01-29 NOTE — Assessment & Plan Note (Signed)
Uses Cpap consistently

## 2013-01-29 NOTE — Assessment & Plan Note (Signed)
Good medication compliance, labs 

## 2013-01-29 NOTE — Assessment & Plan Note (Addendum)
Pneumonia shot 23 provided today

## 2013-01-30 ENCOUNTER — Encounter: Payer: Self-pay | Admitting: Internal Medicine

## 2013-01-30 LAB — TSH: TSH: 1.895 u[IU]/mL (ref 0.350–4.500)

## 2013-01-30 LAB — PSA: PSA: 0.75 ng/mL (ref <=4.00)

## 2013-02-01 LAB — TESTOSTERONE, FREE, TOTAL, SHBG
Sex Hormone Binding: 21 nmol/L (ref 13–71)
Testosterone, Free: 51.3 pg/mL (ref 47.0–244.0)
Testosterone-% Free: 2.4 % (ref 1.6–2.9)
Testosterone: 212 ng/dL — ABNORMAL LOW (ref 300–890)

## 2013-02-03 ENCOUNTER — Encounter: Payer: Self-pay | Admitting: *Deleted

## 2013-02-09 ENCOUNTER — Telehealth: Payer: Self-pay | Admitting: Internal Medicine

## 2013-02-09 NOTE — Telephone Encounter (Signed)
lmovm for pt to return call.  

## 2013-02-09 NOTE — Telephone Encounter (Signed)
Patient wants to know if Dr. Drue Novel is willing to give him a cortisone injection in his ankle.

## 2013-02-10 ENCOUNTER — Ambulatory Visit (INDEPENDENT_AMBULATORY_CARE_PROVIDER_SITE_OTHER): Payer: Medicare Other | Admitting: Cardiology

## 2013-02-10 ENCOUNTER — Encounter: Payer: Self-pay | Admitting: Cardiology

## 2013-02-10 VITALS — BP 136/76 | HR 62 | Wt 197.0 lb

## 2013-02-10 DIAGNOSIS — I10 Essential (primary) hypertension: Secondary | ICD-10-CM

## 2013-02-10 DIAGNOSIS — E785 Hyperlipidemia, unspecified: Secondary | ICD-10-CM

## 2013-02-10 DIAGNOSIS — I2510 Atherosclerotic heart disease of native coronary artery without angina pectoris: Secondary | ICD-10-CM
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
## 2013-02-10 NOTE — Assessment & Plan Note (Signed)
Continue statin. Lipids and liver monitored by primary care. 

## 2013-02-10 NOTE — Assessment & Plan Note (Signed)
Continue present blood pressure medications. 

## 2013-02-10 NOTE — Assessment & Plan Note (Addendum)
Continue aspirin and statin. Schedule myoview for risk stratification.

## 2013-02-10 NOTE — Patient Instructions (Signed)
Your physician wants you to follow-up in: ONE YEAR WITH DR CRENSHAW You will receive a reminder letter in the mail two months in advance. If you don't receive a letter, please call our office to schedule the follow-up appointment.   Your physician has requested that you have en exercise stress myoview. For further information please visit [email or web address]. Please follow instruction sheet, as given.   

## 2013-02-10 NOTE — Telephone Encounter (Signed)
Spoke with pt, he states that he will call his ortho doctor to get his cortisone inj.

## 2013-02-15 ENCOUNTER — Encounter (HOSPITAL_COMMUNITY): Payer: Medicare Other

## 2013-03-04 ENCOUNTER — Telehealth: Payer: Self-pay | Admitting: Cardiology

## 2013-03-04 NOTE — Telephone Encounter (Signed)
Can use short term Olga Millers

## 2013-03-04 NOTE — Telephone Encounter (Signed)
Discussed with dr Jens Som, the pt was given the okay to take ibuprofen.

## 2013-03-04 NOTE — Telephone Encounter (Signed)
Spoke with Devin Martin, he wants to know if dr Jens Som is okay with him using this med for his hip and leg pain. He is taking one tablet once daily. If dr Jens Som does not want him using this drug can he tell the Devin Martin what type of med would be better so he can discuss with dr Drue Novel. Will forward for dr Jens Som review

## 2013-03-04 NOTE — Telephone Encounter (Addendum)
Spoke with pt, Aware of dr Ludwig Clarks recommendations. He wonders which would be better,Taking ibuprofen or this med. Will discuss with dr Jens Som.

## 2013-03-04 NOTE — Telephone Encounter (Signed)
New problem   Pt wants to know if dr Jens Som can prescribe diclofenac 75 mg

## 2013-03-05 ENCOUNTER — Other Ambulatory Visit: Payer: Self-pay | Admitting: Cardiology

## 2013-03-08 ENCOUNTER — Other Ambulatory Visit: Payer: Self-pay | Admitting: *Deleted

## 2013-03-08 ENCOUNTER — Other Ambulatory Visit: Payer: Self-pay | Admitting: Internal Medicine

## 2013-03-08 DIAGNOSIS — K219 Gastro-esophageal reflux disease without esophagitis: Secondary | ICD-10-CM

## 2013-03-08 MED ORDER — OMEPRAZOLE 20 MG PO CPDR: 20.0000 mg | DELAYED_RELEASE_CAPSULE | Freq: Every day | ORAL | Status: DC

## 2013-03-08 NOTE — Telephone Encounter (Signed)
Refill for prilosec sent to [facility] in [place] 

## 2013-03-10 ENCOUNTER — Ambulatory Visit (HOSPITAL_COMMUNITY): Payer: Medicare Other | Attending: Cardiology | Admitting: Radiology

## 2013-03-10 VITALS — BP 136/67 | HR 47 | Ht 64.0 in | Wt 196.0 lb

## 2013-03-10 DIAGNOSIS — I252 Old myocardial infarction: Secondary | ICD-10-CM | POA: Insufficient documentation

## 2013-03-10 DIAGNOSIS — R079 Chest pain, unspecified: Secondary | ICD-10-CM

## 2013-03-10 DIAGNOSIS — E785 Hyperlipidemia, unspecified: Secondary | ICD-10-CM

## 2013-03-10 DIAGNOSIS — E669 Obesity, unspecified: Secondary | ICD-10-CM | POA: Insufficient documentation

## 2013-03-10 DIAGNOSIS — I10 Essential (primary) hypertension: Secondary | ICD-10-CM

## 2013-03-10 DIAGNOSIS — I2510 Atherosclerotic heart disease of native coronary artery without angina pectoris: Secondary | ICD-10-CM

## 2013-03-10 DIAGNOSIS — Z8249 Family history of ischemic heart disease and other diseases of the circulatory system: Secondary | ICD-10-CM | POA: Insufficient documentation

## 2013-03-10 DIAGNOSIS — R0789 Other chest pain: Secondary | ICD-10-CM | POA: Insufficient documentation

## 2013-03-10 DIAGNOSIS — Z87891 Personal history of nicotine dependence: Secondary | ICD-10-CM | POA: Insufficient documentation

## 2013-03-10 MED ORDER — TECHNETIUM TC 99M SESTAMIBI GENERIC - CARDIOLITE
33.0000 | Freq: Once | INTRAVENOUS | Status: AC | PRN
  Administered 2013-03-10: 33 via INTRAVENOUS

## 2013-03-10 MED ORDER — REGADENOSON 0.4 MG/5ML IV SOLN
0.4000 mg | Freq: Once | INTRAVENOUS | Status: AC
  Administered 2013-03-10: 0.4 mg via INTRAVENOUS

## 2013-03-10 MED ORDER — TECHNETIUM TC 99M SESTAMIBI GENERIC - CARDIOLITE
11.0000 | Freq: Once | INTRAVENOUS | Status: AC | PRN
  Administered 2013-03-10: 11 via INTRAVENOUS

## 2013-03-10 NOTE — Progress Notes (Signed)
[facility] SITE 3 NUCLEAR MED [address] [place], [place] [address] [phone]    Cardiology Nuclear Med Study  Devin Martin is a 67 y.o. male     MRN : [id number]     DOB: 12-Feb-1946  Procedure Date: 03/10/2013  Nuclear Med Background Indication for Stress Test:  Evaluation for Ischemia and Stent Patency History:  '05 Stent-LAD; '11 NSTEMI>Cath:patent stent with n/o CAD, EF=60%; '11 BMW:UXLKG distal anterior-apical infarct with trivial P.I. ischemia, EF=66% Cardiac Risk Factors: Family History - CAD, History of Smoking, Hypertension, Lipids and Obesity  Symptoms:  Chest Tightness with and without Exertion (last episode of chest discomfort was about 2-weeks ago.  It comes when I don't take my medicine (Imdur))   Nuclear Pre-Procedure Caffeine/Decaff Intake:  None> 12 hrs NPO After: 7:00pm   Lungs:  Clear. O2 Sat: 96% on room air. IV 0.9% NS with Angio Cath:  22g  IV Site: R Antecubital x 1, tolerated well IV Started by:  Irean Hong, RN  Chest Size (in):  44 Cup Size: n/a  Height: 5\' 4"  (1.626 m)  Weight:  196 lb (88.905 kg)  BMI:  Body mass index is 33.63 kg/(m^2). Tech Comments:  n/a    Nuclear Med Study 1 or 2 day study: 1 day  Stress Test Type:  Treadmill/Lexiscan  Reading MD: Marca Ancona, MD  Order Authorizing Provider:  Olga Millers, MD  Resting Radionuclide: Technetium 52m Sestamibi  Resting Radionuclide Dose: 11.0 mCi   Stress Radionuclide:  Technetium 69m Sestamibi  Stress Radionuclide Dose: 33.0 mCi           Stress Protocol Rest HR: 47 Stress HR: 102  Rest BP: 136/67 Stress BP: 160/64  Exercise Time (min): 6:45 METS: 6.0   Predicted Max HR: 155 bpm % Max HR: 65.81 bpm Rate Pressure Product: 40102   Dose of Adenosine (mg):  n/a Dose of Lexiscan: 0.4 mg  Dose of Atropine (mg): n/a Dose of Dobutamine: n/a mcg/kg/min (at max HR)  Stress Test Technologist: Smiley Houseman, CMA-N  Nuclear Technologist:  Doyne Keel, CNMT     Rest  Procedure:  Myocardial perfusion imaging was performed at rest 45 minutes following the intravenous administration of Technetium 65m Sestamibi.  Rest ECG: NSR - Normal EKG  Stress Procedure: The patient initially walked on the treadmill utilizing the Bruce protocol for 4:45, but was unable to reach his target heart rate.   He was then given IV Lexiscan 0.4 mg over 15-seconds with concurrent low level exercise and then Technetium 3m Sestamibi was injected at 30-seconds while the patient continued walking one more minute.  He c/o dizziness, nausea and his legs being heavy.  Quantitative spect images were obtained after a 45-minute delay.  Stress ECG: No significant change from baseline ECG  QPS Raw Data Images:  Normal; no motion artifact; normal heart/lung ratio. Stress Images:  Small, mild apical anterior perfusion defect.  Medium-sized,, moderate-intensity basal inferoseptal perfusion defect.  Rest Images:  Small, mild apical anterior perfusion defect.  Medium-sized, moderate-intensity basal inferoseptal perfusion defect. Subtraction (SDS):  Fixed, small mild apical anterior perfusion defect.  Fixed, medium-sized, moderate-intensity basal to mid inferoseptal perfusion defect.  Transient Ischemic Dilatation (Normal <1.22):  n/a Lung/Heart Ratio (Normal <0.45):  0,43  Quantitative Gated Spect Images QGS EDV:  88 ml QGS ESV:  32 ml  Impression Exercise Capacity:  Lexiscan with low level exercise. BP Response:  Normal blood pressure response. Clinical Symptoms:  Short of breath ECG Impression:  No  significant ST segment change suggestive of ischemia. PVCs noted.  Comparison with Prior Nuclear Study: Probably similar to prior study.   Overall Impression:  Low risk stress nuclear study.  There was a fixed, small apical anterior perfusion defect and a fixed medium-sized, moderate basal to mid inferoseptal perfusion defect.  There was no ischemia.  The defects may represent artifact due to  normal wall motion but cannot rule out prior infarction.   LV Ejection Fraction: 64%.  LV Wall Motion:  NL LV Function; NL Wall Motion  Marca Ancona 03/10/2013

## 2013-03-30 ENCOUNTER — Telehealth: Payer: Self-pay | Admitting: Internal Medicine

## 2013-03-30 NOTE — Telephone Encounter (Signed)
Immunizations for a splenectomized patient reviewed: --Needs a flu shot every year --Had meningitis vaccination in 2008, due for a booster --Had a  PPV 23 01-2013, also needs a PCV 13 --HiB influenza   2008, no need for posterior --Okay to have a Zostavax. Please advise patient he needs : PCV 13 pneumonia shot. Menactra 4 weeks after the PCV 13 Okay to get a Zostavax at his earliest convenience Flu shot every year.

## 2013-03-31 ENCOUNTER — Encounter: Payer: Self-pay | Admitting: *Deleted

## 2013-03-31 NOTE — Telephone Encounter (Signed)
Discussed with patient, he states he is getting ready to leave to go out of town for the next month and wished [place] to mail him a Physicist, medical. Request honored and letter mailed. Pt. Also requested a phone call in a month to remind him as well.

## 2013-03-31 NOTE — Telephone Encounter (Signed)
FYI

## 2013-05-17 ENCOUNTER — Ambulatory Visit: Payer: Medicare Other | Attending: Family Medicine | Admitting: Physical Therapy

## 2013-05-17 DIAGNOSIS — M25673 Stiffness of unspecified ankle, not elsewhere classified: Secondary | ICD-10-CM | POA: Insufficient documentation

## 2013-05-17 DIAGNOSIS — M25676 Stiffness of unspecified foot, not elsewhere classified: Secondary | ICD-10-CM | POA: Insufficient documentation

## 2013-05-17 DIAGNOSIS — IMO0001 Reserved for inherently not codable concepts without codable children: Secondary | ICD-10-CM | POA: Insufficient documentation

## 2013-05-19 ENCOUNTER — Ambulatory Visit: Payer: Medicare Other | Admitting: Physical Therapy

## 2013-05-21 ENCOUNTER — Ambulatory Visit: Payer: Medicare Other | Admitting: Physical Therapy

## 2013-05-24 ENCOUNTER — Ambulatory Visit: Payer: Medicare Other | Admitting: Physical Therapy

## 2013-05-26 ENCOUNTER — Ambulatory Visit: Payer: Medicare Other | Attending: Family Medicine | Admitting: Physical Therapy

## 2013-05-26 DIAGNOSIS — M25673 Stiffness of unspecified ankle, not elsewhere classified: Secondary | ICD-10-CM | POA: Insufficient documentation

## 2013-05-26 DIAGNOSIS — IMO0001 Reserved for inherently not codable concepts without codable children: Secondary | ICD-10-CM | POA: Insufficient documentation

## 2013-05-26 DIAGNOSIS — M25676 Stiffness of unspecified foot, not elsewhere classified: Secondary | ICD-10-CM | POA: Insufficient documentation

## 2013-05-28 ENCOUNTER — Ambulatory Visit: Payer: Medicare Other | Admitting: Physical Therapy

## 2013-06-14 ENCOUNTER — Ambulatory Visit: Payer: Medicare Other | Admitting: Physical Therapy

## 2013-06-15 ENCOUNTER — Ambulatory Visit: Payer: Medicare Other | Admitting: Physical Therapy

## 2013-06-17 ENCOUNTER — Ambulatory Visit: Payer: Medicare Other | Admitting: Physical Therapy

## 2013-06-18 ENCOUNTER — Ambulatory Visit: Payer: Medicare Other | Admitting: Physical Therapy

## 2013-06-21 ENCOUNTER — Ambulatory Visit: Payer: Medicare Other | Admitting: Physical Therapy

## 2013-06-23 ENCOUNTER — Ambulatory Visit: Payer: Medicare Other | Admitting: Physical Therapy

## 2013-06-25 ENCOUNTER — Ambulatory Visit: Payer: Medicare Other | Admitting: Physical Therapy

## 2013-07-01 ENCOUNTER — Other Ambulatory Visit: Payer: Self-pay

## 2013-07-19 ENCOUNTER — Other Ambulatory Visit: Payer: Self-pay | Admitting: *Deleted

## 2013-07-19 MED ORDER — ROSUVASTATIN CALCIUM 40 MG PO TABS: 40.0000 mg | ORAL_TABLET | Freq: Every day | ORAL | Status: DC

## 2013-07-28 ENCOUNTER — Ambulatory Visit (INDEPENDENT_AMBULATORY_CARE_PROVIDER_SITE_OTHER): Payer: Medicare Other | Admitting: Internal Medicine

## 2013-07-28 ENCOUNTER — Encounter: Payer: Self-pay | Admitting: Internal Medicine

## 2013-07-28 VITALS — BP 108/63 | HR 65 | Temp 97.6°F | Wt 190.0 lb

## 2013-07-28 DIAGNOSIS — L723 Sebaceous cyst: Secondary | ICD-10-CM

## 2013-07-28 DIAGNOSIS — L089 Local infection of the skin and subcutaneous tissue, unspecified: Secondary | ICD-10-CM

## 2013-07-28 MED ORDER — DOXYCYCLINE HYCLATE 100 MG PO TABS: 100.0000 mg | ORAL_TABLET | Freq: Two times a day (BID) | ORAL | Status: DC

## 2013-07-28 NOTE — Progress Notes (Signed)
Pre visit review using our clinic review tool, if applicable. No additional management support is needed unless otherwise documented below in the visit note. 
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
## 2013-07-28 NOTE — Patient Instructions (Addendum)
Take the antibiotic as prescribed, if  not improving within the next 3-4 days please call me for referral.

## 2013-07-30 ENCOUNTER — Telehealth (INDEPENDENT_AMBULATORY_CARE_PROVIDER_SITE_OTHER): Payer: Self-pay | Admitting: General Surgery

## 2013-07-30 ENCOUNTER — Other Ambulatory Visit: Payer: Self-pay | Admitting: *Deleted

## 2013-07-30 ENCOUNTER — Telehealth: Payer: Self-pay | Admitting: Internal Medicine

## 2013-07-30 ENCOUNTER — Encounter (HOSPITAL_COMMUNITY): Payer: Self-pay | Admitting: *Deleted

## 2013-07-30 DIAGNOSIS — L723 Sebaceous cyst: Secondary | ICD-10-CM

## 2013-07-30 NOTE — Telephone Encounter (Signed)
Patient called stating that he would like a referral because his knee is not getting better. Patient also insists that the referral be done today and he would like a call back.

## 2013-07-30 NOTE — Progress Notes (Signed)
Patient had surgery approximately a year ago with Dr Derrell Lolling at [facility].  Patient stated he had episode of itching after he was given preop medication.  Patient unaware of name of preop medication.

## 2013-07-30 NOTE — Telephone Encounter (Signed)
Pt called for appt to be seen for a mass that developed on the posterior knee of the leg he has BKA.  Wearing his prosthetic leg has now become painful.  Pt was referred to [facility] to address removal of this.  Gave him the phone number to call.

## 2013-07-30 NOTE — Telephone Encounter (Signed)
Patient called back regarding this. He is requesting to be seen today at [facility]. Best # [phone]

## 2013-07-30 NOTE — Telephone Encounter (Signed)
Error. BC °

## 2013-07-30 NOTE — Progress Notes (Signed)
Surgery scheduled for 07/31/13.  Need orders in EPIC.  Thank You.

## 2013-07-30 NOTE — Telephone Encounter (Signed)
Per patient, he spoke with [facility] and they advised him to call a vascular surgeon. That is not what they told me when I called, but I advised patient to follow the instructions he was given by the general surgeon. Patient will call the office to which they referred him.

## 2013-07-30 NOTE — Telephone Encounter (Signed)
Call from [place] @ [facility] stating they need referral for this patient. Patient is stating this is an urgent matter and he needs to be seen today. Per Okey Regal, they have no openings for a new patient today, but if you think he does need to be seen today she is suggesting that you call Dr. Imogene Burn at [phone] and discuss this with him to see if they do a work-in for this patient.

## 2013-07-30 NOTE — Telephone Encounter (Signed)
Urgent referral placed for general surgery to take a look of cyst on patients inner left knee. JG//CMA

## 2013-07-30 NOTE — Telephone Encounter (Signed)
he called this morning and an got an appointment  few hours later. [place], thank you for your efforts.

## 2013-07-30 NOTE — Telephone Encounter (Signed)
Reference phone note dated 07/30/13 description: referral  Called pt to let him know that he needed to see ortho for the cyst on his L knee. Patient stated he sees [facility]. Called [facility] and they gave pt appt w/Dr. Charlann Boxer @ 2:45pm. Called pt to advise him about appt. Patient states that he called at 8:00 this morning to get this referral and it is unacceptable that it took this long to get an appointment. Patient states he feels that he is not a priority and that this was an urgent situation that should have been handled in a more timely manner. Patient also states that he feels other physicians at other offices are more attentive to their patients. I advised pt that he is a priority, as are all of our patients, however the doctor is seeing patients in the office and he must respond to patient calls in between visits. Patient reiterated several times that it should not have taken all day to get an appointment. I apologized and advised patient that this was a complex situation due to his condition and the advice that was given to him from his surgeon's office.   Patient requested that the following go into his chart: He called a couple years ago to get a same day appointment for a stomach virus. Patient was given appt next morning due to provider availability. Patient states he walked into the office anyway and stated "I am staying here until someone sees me." I advised patient that since that time we have increased our access by being able to schedule at other [place] locations for acute appointments, minimizing the wait time for an acute appointment.   Patient was still dissatisfied despite my efforts to diffuse the situation. I advised him that his concerns would be forwarded to our clinic team lead and that he could expect [place] to follow up within the next week.

## 2013-07-30 NOTE — Telephone Encounter (Signed)
[place], per our conversation, okay to refer to orthopedic surgery (per surgical office advise)

## 2013-07-30 NOTE — Telephone Encounter (Signed)
Arrange a ortho referral, does not need [facility]. I believe he is a pt already with a group in town. If he thinks is that urgent , suggest to go the ortho walkin clinic

## 2013-07-30 NOTE — Progress Notes (Signed)
Patient states he takes all medications in the evening.

## 2013-07-31 ENCOUNTER — Encounter (HOSPITAL_COMMUNITY): Payer: Self-pay

## 2013-07-31 ENCOUNTER — Encounter (HOSPITAL_COMMUNITY): Payer: Medicare Other | Admitting: Anesthesiology

## 2013-07-31 ENCOUNTER — Ambulatory Visit (HOSPITAL_COMMUNITY): Payer: Medicare Other

## 2013-07-31 ENCOUNTER — Observation Stay (HOSPITAL_COMMUNITY): Payer: Medicare Other | Admitting: Anesthesiology

## 2013-07-31 ENCOUNTER — Encounter (HOSPITAL_COMMUNITY)
Admission: RE | Disposition: A | Discharge status: Home / Self Care | Payer: Self-pay | Source: Ambulatory Visit | Attending: Orthopedic Surgery

## 2013-07-31 ENCOUNTER — Ambulatory Visit (HOSPITAL_COMMUNITY)
Admission: RE | Admit: 2013-07-31 | Discharge: 2013-08-01 | Disposition: A | Discharge status: Home / Self Care | Payer: Medicare Other | Source: Ambulatory Visit | Attending: Orthopedic Surgery | Admitting: Orthopedic Surgery

## 2013-07-31 DIAGNOSIS — Z87891 Personal history of nicotine dependence: Secondary | ICD-10-CM | POA: Insufficient documentation

## 2013-07-31 DIAGNOSIS — Z7982 Long term (current) use of aspirin: Secondary | ICD-10-CM | POA: Insufficient documentation

## 2013-07-31 DIAGNOSIS — L02416 Cutaneous abscess of left lower limb: Secondary | ICD-10-CM | POA: Diagnosis present

## 2013-07-31 DIAGNOSIS — S88119A Complete traumatic amputation at level between knee and ankle, unspecified lower leg, initial encounter: Secondary | ICD-10-CM | POA: Insufficient documentation

## 2013-07-31 DIAGNOSIS — G4733 Obstructive sleep apnea (adult) (pediatric): Secondary | ICD-10-CM | POA: Insufficient documentation

## 2013-07-31 DIAGNOSIS — I252 Old myocardial infarction: Secondary | ICD-10-CM | POA: Insufficient documentation

## 2013-07-31 DIAGNOSIS — L02419 Cutaneous abscess of limb, unspecified: Secondary | ICD-10-CM | POA: Insufficient documentation

## 2013-07-31 DIAGNOSIS — E785 Hyperlipidemia, unspecified: Secondary | ICD-10-CM | POA: Insufficient documentation

## 2013-07-31 DIAGNOSIS — I10 Essential (primary) hypertension: Secondary | ICD-10-CM | POA: Insufficient documentation

## 2013-07-31 DIAGNOSIS — I2510 Atherosclerotic heart disease of native coronary artery without angina pectoris: Secondary | ICD-10-CM | POA: Insufficient documentation

## 2013-07-31 DIAGNOSIS — Z79899 Other long term (current) drug therapy: Secondary | ICD-10-CM | POA: Insufficient documentation

## 2013-07-31 HISTORY — DX: Other complications of anesthesia, initial encounter: T88.59XA

## 2013-07-31 HISTORY — DX: Adverse effect of unspecified anesthetic, initial encounter: T41.45XA

## 2013-07-31 HISTORY — DX: Pneumonia, unspecified organism: J18.9

## 2013-07-31 HISTORY — PX: INCISION AND DRAINAGE ABSCESS: SHX5864

## 2013-07-31 LAB — GRAM STAIN

## 2013-07-31 LAB — CBC
Hemoglobin: 14.2 g/dL (ref 13.0–17.0)
MCH: 31.7 pg (ref 26.0–34.0)
MCHC: 35.3 g/dL (ref 30.0–36.0)
MCV: 89.7 fL (ref 78.0–100.0)
RDW: 12.2 % (ref 11.5–15.5)
WBC: 5.4 10*3/uL (ref 4.0–10.5)

## 2013-07-31 LAB — BASIC METABOLIC PANEL
BUN: 24 mg/dL — ABNORMAL HIGH (ref 6–23)
Calcium: 9.5 mg/dL (ref 8.4–10.5)
Creatinine, Ser: 0.94 mg/dL (ref 0.50–1.35)
GFR calc Af Amer: >90 mL/min (ref >90)

## 2013-07-31 SURGERY — INCISION AND DRAINAGE, ABSCESS
Anesthesia: General | Site: Knee | Laterality: Left

## 2013-07-31 MED ORDER — BISACODYL 10 MG RE SUPP: 10.0000 mg | Freq: Every day | RECTAL | Status: DC | PRN

## 2013-07-31 MED ORDER — VANCOMYCIN HCL IN DEXTROSE 1-5 GM/200ML-% IV SOLN
INTRAVENOUS | Status: AC
  Filled 2013-07-31: qty 200

## 2013-07-31 MED ORDER — ALUM & MAG HYDROXIDE-SIMETH 200-200-20 MG/5ML PO SUSP: 30.0000 mL | ORAL | Status: DC | PRN

## 2013-07-31 MED ORDER — MIDAZOLAM HCL 2 MG/2ML IJ SOLN
INTRAMUSCULAR | Status: AC
  Filled 2013-07-31: qty 2

## 2013-07-31 MED ORDER — LACTATED RINGERS IV SOLN
INTRAVENOUS | Status: DC | PRN
  Administered 2013-07-31: 09:00:00 via INTRAVENOUS

## 2013-07-31 MED ORDER — ZOLPIDEM TARTRATE 5 MG PO TABS: 5.0000 mg | ORAL_TABLET | Freq: Every evening | ORAL | Status: DC | PRN

## 2013-07-31 MED ORDER — NITROGLYCERIN 0.4 MG SL SUBL: 0.4000 mg | SUBLINGUAL_TABLET | SUBLINGUAL | Status: DC | PRN

## 2013-07-31 MED ORDER — PANTOPRAZOLE SODIUM 40 MG PO TBEC
40.0000 mg | DELAYED_RELEASE_TABLET | Freq: Every day | ORAL | Status: DC
  Administered 2013-07-31 – 2013-08-01 (×2): 40 mg via ORAL
  Filled 2013-07-31 (×2): qty 1

## 2013-07-31 MED ORDER — ONDANSETRON HCL 4 MG/2ML IJ SOLN
INTRAMUSCULAR | Status: AC
  Filled 2013-07-31: qty 2

## 2013-07-31 MED ORDER — PROPOFOL 10 MG/ML IV BOLUS
INTRAVENOUS | Status: AC
  Filled 2013-07-31: qty 20

## 2013-07-31 MED ORDER — MENTHOL 3 MG MT LOZG: 1.0000 | LOZENGE | OROMUCOSAL | Status: DC | PRN

## 2013-07-31 MED ORDER — HYDROCODONE-ACETAMINOPHEN 5-325 MG PO TABS: 1.0000 | ORAL_TABLET | ORAL | Status: DC | PRN

## 2013-07-31 MED ORDER — LIDOCAINE HCL (CARDIAC) 20 MG/ML IV SOLN
INTRAVENOUS | Status: DC | PRN
  Administered 2013-07-31: 100 mg via INTRAVENOUS

## 2013-07-31 MED ORDER — HYDROCODONE-ACETAMINOPHEN 5-325 MG PO TABS
1.0000 | ORAL_TABLET | ORAL | Status: DC
  Administered 2013-07-31 (×2): 1 via ORAL
  Administered 2013-07-31: 2 via ORAL
  Administered 2013-08-01: 1 via ORAL
  Filled 2013-07-31: qty 2
  Filled 2013-07-31: qty 1
  Filled 2013-07-31 (×2): qty 2

## 2013-07-31 MED ORDER — FENTANYL CITRATE 0.05 MG/ML IJ SOLN
25.0000 ug | INTRAMUSCULAR | Status: DC | PRN
  Administered 2013-07-31 (×2): 50 ug via INTRAVENOUS

## 2013-07-31 MED ORDER — LACTATED RINGERS IV SOLN: INTRAVENOUS | Status: DC

## 2013-07-31 MED ORDER — SULFAMETHOXAZOLE-TMP DS 800-160 MG PO TABS: 1.0000 | ORAL_TABLET | Freq: Two times a day (BID) | ORAL | Status: DC

## 2013-07-31 MED ORDER — MELOXICAM 15 MG PO TABS
15.0000 mg | ORAL_TABLET | Freq: Every evening | ORAL | Status: DC
  Administered 2013-07-31: 15 mg via ORAL
  Filled 2013-07-31 (×2): qty 1

## 2013-07-31 MED ORDER — ONDANSETRON HCL 4 MG PO TABS: 4.0000 mg | ORAL_TABLET | Freq: Four times a day (QID) | ORAL | Status: DC | PRN

## 2013-07-31 MED ORDER — PROMETHAZINE HCL 25 MG/ML IJ SOLN: 6.2500 mg | INTRAMUSCULAR | Status: DC | PRN

## 2013-07-31 MED ORDER — DILTIAZEM HCL ER BEADS 240 MG PO CP24: 360.0000 mg | ORAL_CAPSULE | Freq: Every evening | ORAL | Status: DC

## 2013-07-31 MED ORDER — DILTIAZEM HCL ER COATED BEADS 360 MG PO CP24
360.0000 mg | ORAL_CAPSULE | Freq: Every day | ORAL | Status: DC
  Administered 2013-07-31: 300 mg via ORAL
  Filled 2013-07-31 (×2): qty 1

## 2013-07-31 MED ORDER — METOCLOPRAMIDE HCL 5 MG/ML IJ SOLN: 5.0000 mg | Freq: Three times a day (TID) | INTRAMUSCULAR | Status: DC | PRN

## 2013-07-31 MED ORDER — DOCUSATE SODIUM 100 MG PO CAPS
100.0000 mg | ORAL_CAPSULE | Freq: Two times a day (BID) | ORAL | Status: DC
  Administered 2013-07-31 – 2013-08-01 (×2): 100 mg via ORAL

## 2013-07-31 MED ORDER — POLYETHYLENE GLYCOL 3350 17 G PO PACK: 17.0000 g | PACK | Freq: Every day | ORAL | Status: DC | PRN

## 2013-07-31 MED ORDER — ONDANSETRON HCL 4 MG/2ML IJ SOLN
INTRAMUSCULAR | Status: DC | PRN
  Administered 2013-07-31: 4 mg via INTRAVENOUS

## 2013-07-31 MED ORDER — ASPIRIN 81 MG PO TBDP: 81.0000 mg | ORAL_TABLET | Freq: Every evening | ORAL | Status: DC

## 2013-07-31 MED ORDER — POTASSIUM CITRATE ER 10 MEQ (1080 MG) PO TBCR
20.0000 meq | EXTENDED_RELEASE_TABLET | Freq: Every day | ORAL | Status: DC
  Administered 2013-07-31 – 2013-08-01 (×2): 20 meq via ORAL
  Filled 2013-07-31 (×2): qty 2

## 2013-07-31 MED ORDER — FENTANYL CITRATE 0.05 MG/ML IJ SOLN
INTRAMUSCULAR | Status: AC
  Filled 2013-07-31: qty 2

## 2013-07-31 MED ORDER — ISOSORBIDE MONONITRATE ER 60 MG PO TB24
60.0000 mg | ORAL_TABLET | Freq: Every day | ORAL | Status: DC
  Administered 2013-07-31 – 2013-08-01 (×2): 60 mg via ORAL
  Filled 2013-07-31 (×2): qty 1

## 2013-07-31 MED ORDER — PHENOL 1.4 % MT LIQD: 1.0000 | OROMUCOSAL | Status: DC | PRN

## 2013-07-31 MED ORDER — METOCLOPRAMIDE HCL 10 MG PO TABS: 5.0000 mg | ORAL_TABLET | Freq: Three times a day (TID) | ORAL | Status: DC | PRN

## 2013-07-31 MED ORDER — 0.9 % SODIUM CHLORIDE (POUR BTL) OPTIME
TOPICAL | Status: DC | PRN
  Administered 2013-07-31 (×2): 1000 mL

## 2013-07-31 MED ORDER — ONDANSETRON HCL 4 MG/2ML IJ SOLN: 4.0000 mg | Freq: Four times a day (QID) | INTRAMUSCULAR | Status: DC | PRN

## 2013-07-31 MED ORDER — METHOCARBAMOL 100 MG/ML IJ SOLN
500.0000 mg | Freq: Four times a day (QID) | INTRAVENOUS | Status: DC | PRN
  Filled 2013-07-31: qty 5

## 2013-07-31 MED ORDER — PROPOFOL 10 MG/ML IV BOLUS
INTRAVENOUS | Status: DC | PRN
  Administered 2013-07-31: 200 mg via INTRAVENOUS

## 2013-07-31 MED ORDER — VANCOMYCIN HCL 1000 MG IV SOLR
1000.0000 mg | Freq: Once | INTRAVENOUS | Status: AC
  Administered 2013-07-31: 1000 mg via INTRAVENOUS
  Filled 2013-07-31: qty 1000

## 2013-07-31 MED ORDER — METHOCARBAMOL 500 MG PO TABS
500.0000 mg | ORAL_TABLET | Freq: Four times a day (QID) | ORAL | Status: DC | PRN
  Administered 2013-08-01: 500 mg via ORAL
  Filled 2013-07-31: qty 1

## 2013-07-31 MED ORDER — FLEET ENEMA 7-19 GM/118ML RE ENEM: 1.0000 | ENEMA | Freq: Once | RECTAL | Status: AC | PRN

## 2013-07-31 MED ORDER — SUCCINYLCHOLINE CHLORIDE 20 MG/ML IJ SOLN
INTRAMUSCULAR | Status: AC
  Filled 2013-07-31: qty 1

## 2013-07-31 MED ORDER — SODIUM CHLORIDE 0.9 % IV SOLN
INTRAVENOUS | Status: DC
  Administered 2013-07-31 – 2013-08-01 (×2): via INTRAVENOUS
  Filled 2013-07-31 (×10): qty 1000

## 2013-07-31 MED ORDER — ASPIRIN EC 81 MG PO TBEC
81.0000 mg | DELAYED_RELEASE_TABLET | Freq: Every day | ORAL | Status: DC
  Administered 2013-07-31 – 2013-08-01 (×2): 81 mg via ORAL
  Filled 2013-07-31 (×2): qty 1

## 2013-07-31 MED ORDER — HYDROMORPHONE HCL PF 1 MG/ML IJ SOLN
0.5000 mg | INTRAMUSCULAR | Status: DC | PRN
  Administered 2013-07-31: 1 mg via INTRAVENOUS
  Filled 2013-07-31: qty 1

## 2013-07-31 MED ORDER — LIDOCAINE HCL (CARDIAC) 20 MG/ML IV SOLN
INTRAVENOUS | Status: AC
  Filled 2013-07-31: qty 5

## 2013-07-31 MED ORDER — DIPHENHYDRAMINE HCL 25 MG PO CAPS
25.0000 mg | ORAL_CAPSULE | Freq: Four times a day (QID) | ORAL | Status: DC | PRN
  Administered 2013-07-31 – 2013-08-01 (×2): 25 mg via ORAL
  Filled 2013-07-31 (×2): qty 1

## 2013-07-31 MED ORDER — ATORVASTATIN CALCIUM 80 MG PO TABS
80.0000 mg | ORAL_TABLET | Freq: Every day | ORAL | Status: DC
  Administered 2013-07-31: 80 mg via ORAL
  Filled 2013-07-31 (×2): qty 1

## 2013-07-31 MED ORDER — VANCOMYCIN HCL IN DEXTROSE 1-5 GM/200ML-% IV SOLN
1000.0000 mg | Freq: Two times a day (BID) | INTRAVENOUS | Status: AC
  Administered 2013-07-31 – 2013-08-01 (×2): 1000 mg via INTRAVENOUS
  Filled 2013-07-31 (×2): qty 200

## 2013-07-31 MED ORDER — MIDAZOLAM HCL 5 MG/5ML IJ SOLN
INTRAMUSCULAR | Status: DC | PRN
  Administered 2013-07-31: 2 mg via INTRAVENOUS

## 2013-07-31 MED ORDER — FENTANYL CITRATE 0.05 MG/ML IJ SOLN
INTRAMUSCULAR | Status: DC | PRN
  Administered 2013-07-31: 50 ug via INTRAVENOUS

## 2013-07-31 SURGICAL SUPPLY — 42 items
BAG SPEC THK2 15X12 ZIP CLS (MISCELLANEOUS) ×1
BAG ZIPLOCK 12X15 (MISCELLANEOUS) ×2 IMPLANT
BANDAGE ELASTIC 4 VELCRO ST LF (GAUZE/BANDAGES/DRESSINGS) ×1 IMPLANT
BANDAGE ESMARK 6X9 LF (GAUZE/BANDAGES/DRESSINGS) ×1 IMPLANT
BANDAGE GAUZE ELAST BULKY 4 IN (GAUZE/BANDAGES/DRESSINGS) ×1 IMPLANT
BNDG CMPR 9X6 STRL LF SNTH (GAUZE/BANDAGES/DRESSINGS)
BNDG ESMARK 6X9 LF (GAUZE/BANDAGES/DRESSINGS)
BNDG GAUZE ELAST 4 BULKY (GAUZE/BANDAGES/DRESSINGS) ×2 IMPLANT
CUFF TOURN SGL QUICK 18 (TOURNIQUET CUFF) ×1 IMPLANT
CUFF TOURN SGL QUICK 24 (TOURNIQUET CUFF)
CUFF TOURN SGL QUICK 34 (TOURNIQUET CUFF) ×2
CUFF TRNQT CYL 24X4X40X1 (TOURNIQUET CUFF) ×1 IMPLANT
CUFF TRNQT CYL 34X4X40X1 (TOURNIQUET CUFF) ×1 IMPLANT
DRAIN PENROSE 18X1/2 LTX STRL (DRAIN) ×1 IMPLANT
DRSG PAD ABDOMINAL 8X10 ST (GAUZE/BANDAGES/DRESSINGS) ×1 IMPLANT
DURAPREP 26ML APPLICATOR (WOUND CARE) ×2 IMPLANT
ELECT REM PT RETURN 9FT ADLT (ELECTROSURGICAL) ×2
ELECTRODE REM PT RTRN 9FT ADLT (ELECTROSURGICAL) ×1 IMPLANT
GAUZE PACKING IODOFORM 1/4X5 (PACKING) ×1 IMPLANT
GAUZE XEROFORM 1X8 LF (GAUZE/BANDAGES/DRESSINGS) ×1 IMPLANT
GLOVE BIOGEL PI IND STRL 7.5 (GLOVE) ×1 IMPLANT
GLOVE BIOGEL PI IND STRL 8 (GLOVE) ×1 IMPLANT
GLOVE BIOGEL PI INDICATOR 7.5 (GLOVE) ×1
GLOVE BIOGEL PI INDICATOR 8 (GLOVE) ×1
GLOVE ECLIPSE 8.0 STRL XLNG CF (GLOVE) IMPLANT
GLOVE ORTHO TXT STRL SZ7.5 (GLOVE) ×4 IMPLANT
GLOVE SURG ORTHO 8.0 STRL STRW (GLOVE) ×2 IMPLANT
GOWN BRE IMP PREV XXLGXLNG (GOWN DISPOSABLE) ×4 IMPLANT
GOWN PREVENTION PLUS LG XLONG (DISPOSABLE) ×2 IMPLANT
HANDPIECE INTERPULSE COAX TIP (DISPOSABLE)
KIT BASIN OR (CUSTOM PROCEDURE TRAY) ×2 IMPLANT
MANIFOLD NEPTUNE II (INSTRUMENTS) ×1 IMPLANT
PACK LOWER EXTREMITY WL (CUSTOM PROCEDURE TRAY) ×2 IMPLANT
PAD ABD 8X10 STRL (GAUZE/BANDAGES/DRESSINGS) ×2 IMPLANT
PAD CAST 4YDX4 CTTN HI CHSV (CAST SUPPLIES) ×1 IMPLANT
PADDING CAST COTTON 4X4 STRL (CAST SUPPLIES) ×2
POSITIONER SURGICAL ARM (MISCELLANEOUS) ×1 IMPLANT
SET HNDPC FAN SPRY TIP SCT (DISPOSABLE) ×1 IMPLANT
SOL PREP PROV IODINE SCRUB 4OZ (MISCELLANEOUS) ×1 IMPLANT
SPONGE GAUZE 4X4 12PLY (GAUZE/BANDAGES/DRESSINGS) ×2 IMPLANT
SYR CONTROL 10ML LL (SYRINGE) ×1 IMPLANT
TOWEL OR 17X26 10 PK STRL BLUE (TOWEL DISPOSABLE) ×3 IMPLANT
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
## 2013-07-31 NOTE — Progress Notes (Signed)
pacu nursing:  Received called lab report: abundant wbc's seen, PMN's and mononuclear.  Rare gram + cocci  Information given to matt babbish, pa for dr. Charlann Boxer.  Information acknowledged.  No new orders

## 2013-07-31 NOTE — Transfer of Care (Signed)
Immediate Anesthesia Transfer of Care Note  Patient: Devin Martin  Procedure(s) Performed: Procedure(s): INCISIONAL/NON-INCISIONAL DEBRIDEMENT OF ABSCESS LEFT KNEE (Left)  Patient Location: PACU  Anesthesia Type:General  Level of Consciousness: awake and oriented  Airway & Oxygen Therapy: Patient Spontanous Breathing and Patient connected to face mask oxygen  Post-op Assessment: Report given to PACU RN and Post -op Vital signs reviewed and stable  Post vital signs: Reviewed and stable  Complications: No apparent anesthesia complications

## 2013-07-31 NOTE — Preoperative (Signed)
Beta Blockers   Reason not to administer Beta Blockers:Not Applicable 

## 2013-07-31 NOTE — Progress Notes (Addendum)
Per anesthesia, okay to not do PT/PTT,INR,  Urinalysis and pre-op scrub/prep of site due to late entry by MD/PA.  Anesthesia nurse states the prep of site will be done in OR.

## 2013-07-31 NOTE — Op Note (Signed)
Devin Martin, Devin Martin NO.:  [id number]  MEDICAL RECORD NO.:  [id number]  LOCATION:  1612                         FACILITY:  [facility]  PHYSICIAN:  Madlyn Frankel. Charlann Boxer, M.D.  DATE OF BIRTH:  [birth year]  DATE OF PROCEDURE:  07/31/2013 DATE OF DISCHARGE:                              OPERATIVE REPORT   PREOPERATIVE DIAGNOSIS:  Left posteromedial abscess left leg with history of left below-the-knee amputation and prosthetic wear.  POSTOPERATIVE DIAGNOSIS:  Left posteromedial abscess left leg with history of left below-the-knee amputation and prosthetic wear.  PROCEDURE:  Both excisional and nonexcisional debridement of left popliteal abscess, excising sharply with a scalpel about 3 cm incision, removing nonviable necrotic tissue, followed by nonexcisional irrigation of left leg abscess.  SURGEON:  Madlyn Frankel. Charlann Boxer, M.D.  ASSISTANT:  Surgical team.  ANESTHESIA:  General LMA.  SPECIMENS:  Purulent material taken from the abscess upon opening was sent to Pathology for Gram stain culture and drained.  I did pack the wound with quarter-inch iodoform gauze with near complete closure.  INDICATIONS FOR PROCEDURE:  Devin Martin is a pleasant 67 year old gentleman with history of left below-the-knee amputation.  He presented to the office yesterday with increasing pain, swelling, pain, and erythema on the posteromedial aspect of his popliteal fossa right at the area of his articulation with knee flexion with the prosthetic component.  He had tried some oral antibiotics per his primary care physician with no significant improvement.  I felt at this point, this most important to go ahead and lanced this abscess and removed the purulent.  The risks, benefits, and the necessity of the procedure were discussed as well as the postoperative course as it relates to his prosthetic implant.  The consent was obtained for above.  PROCEDURE IN DETAIL:  The patient was brought to the  operative theater. Once adequate anesthesia was established, perioperative antibiotics initiated including a 1 g of vancomycin due to penicillin allergy.  He was positioned supine.  A thigh tourniquet was placed.  Time-out was performed identifying the patient, planned procedure, and extremity. The left lower extremity was then prepped and draped in sterile fashion. Leg was exsanguinated by elevation.  Tourniquet elevated to 200 mmHg. The area of abscess was identified and longitudinal incision was made just proximal to the abscess upon entry into the abscess.  Purulent material was identified and swabbed for culture.  At this point, necrotic nonviable tissue was debrided, and the abscess cavity opened up completely.  Once I felt that it had been evacuated and adequately debrided, we irrigated this abscess with 2 L of normal saline solution. Following the irrigation, the packed the wound with quarter 0.25 inch iodoform gauze, and reapproximate the skin edges with 2 horizontal mattress sutures proximally and 1 simple suture distal to the iodoform gauze, but not sutured in place.  The wound was then cleaned, dried, and dressed sterilely using Xeroform and a bulky sterile wrap.  He was then woken from anesthesia and brought to the recovery room in stable condition.  He will be admitted overnight for observation for IV vancomycin.  He will be seen in the morning by one of my partners where the gauze  will be removed and gauze reapplied to the wound.  He will be seen back in 2 weeks.  He will be discharged on Bactrim orally for 2 weeks.  We would ask that he not use his prosthetic component until his wound is fully healed to prevent further aggravation and complications.     Madlyn Frankel Charlann Boxer, M.D.     MDO/MEDQ  D:  07/31/2013  T:  07/31/2013  Job:  [id number]

## 2013-07-31 NOTE — Brief Op Note (Signed)
07/31/2013  10:24 AM  PATIENT:  Devin Martin  67 y.o. male  PRE-OPERATIVE DIAGNOSIS:  left knee popliteal abscess  POST-OPERATIVE DIAGNOSIS:  left knee popliteal abscess  PROCEDURE:  Procedure(s): INCISIONAL/NON-INCISIONAL DEBRIDEMENT OF ABSCESS LEFT KNEE (Left), sharp excision 3cm with scalpel, non-excisional irrigation of left leg wound  SURGEON:  Surgeon(s) and Role:    * Shelda Pal, MD - Primary  PHYSICIAN ASSISTANT: No  ANESTHESIA:   general  EBL:  Total I/O In: 950 [I.V.:750; IV Piggyback:200] Out: 25 [Blood:25]  BLOOD ADMINISTERED:none  DRAINS: quarter inch iodoform gauze   LOCAL MEDICATIONS USED:  NONE  SPECIMEN:  Source of Specimen:  lef tleg abscess  DISPOSITION OF SPECIMEN:  PATHOLOGY  COUNTS:  YES  TOURNIQUET:   Total Tourniquet Time Documented: Thigh (Left) - 5 minutes Total: Thigh (Left) - 5 minutes   DICTATION: .Other Dictation: Dictation Number [id number]  PLAN OF CARE: Admit for overnight observation  PATIENT DISPOSITION:  PACU - hemodynamically stable.   Delay start of Pharmacological VTE agent (>24hrs) due to surgical blood loss or risk of bleeding: no

## 2013-07-31 NOTE — Anesthesia Preprocedure Evaluation (Addendum)
Anesthesia Evaluation  Patient identified by MRN, date of birth, ID band Patient awake    Reviewed: Allergy & Precautions, H&P , NPO status , Patient's Chart, lab work & pertinent test results  Airway Mallampati: II TM Distance: >3 FB Neck ROM: Full    Dental no notable dental hx. (+) Teeth Intact and Dental Advisory Given   Pulmonary shortness of breath, sleep apnea and Continuous Positive Airway Pressure Ventilation , former smoker,  breath sounds clear to auscultation  Pulmonary exam normal       Cardiovascular hypertension, Pt. on medications + CAD, + Past MI and + Cardiac Stents Rhythm:Regular Rate:Normal     Neuro/Psych Anxiety negative neurological ROS  negative psych ROS   GI/Hepatic Neg liver ROS, GERD-  Medicated,  Endo/Other  negative endocrine ROS  Renal/GU   negative genitourinary   Musculoskeletal   Abdominal   Peds  Hematology negative hematology ROS (+)   Anesthesia Other Findings   Reproductive/Obstetrics negative OB ROS                          Anesthesia Physical Anesthesia Plan  ASA: III and emergent  Anesthesia Plan: General   Post-op Pain Management:    Induction: Intravenous  Airway Management Planned: LMA  Additional Equipment:   Intra-op Plan:   Post-operative Plan: Extubation in OR  Informed Consent: I have reviewed the patients History and Physical, chart, labs and discussed the procedure including the risks, benefits and alternatives for the proposed anesthesia with the patient or authorized representative who has indicated his/her understanding and acceptance.   Dental advisory given  Plan Discussed with: CRNA  Anesthesia Plan Comments:         Anesthesia Quick Evaluation

## 2013-08-01 LAB — BASIC METABOLIC PANEL
BUN: 13 mg/dL (ref 6–23)
Calcium: 9 mg/dL (ref 8.4–10.5)
Chloride: 101 mEq/L (ref 96–112)
Creatinine, Ser: 0.8 mg/dL (ref 0.50–1.35)
GFR calc Af Amer: >90 mL/min (ref >90)
GFR calc non Af Amer: >90 mL/min (ref >90)
Glucose, Bld: 97 mg/dL (ref 70–99)
Potassium: 4.4 mEq/L (ref 3.5–5.1)

## 2013-08-01 LAB — CBC
HCT: 40.4 % (ref 39.0–52.0)
MCH: 30.7 pg (ref 26.0–34.0)
MCHC: 33.9 g/dL (ref 30.0–36.0)
RDW: 12.3 % (ref 11.5–15.5)
WBC: 6.8 10*3/uL (ref 4.0–10.5)

## 2013-08-01 MED ORDER — DSS 100 MG PO CAPS: 100.0000 mg | ORAL_CAPSULE | Freq: Two times a day (BID) | ORAL | Status: DC

## 2013-08-01 MED ORDER — BISACODYL 10 MG RE SUPP: 10.0000 mg | Freq: Every day | RECTAL | Status: DC | PRN

## 2013-08-01 MED ORDER — ONDANSETRON HCL 4 MG PO TABS: 4.0000 mg | ORAL_TABLET | Freq: Four times a day (QID) | ORAL | Status: DC | PRN

## 2013-08-01 MED ORDER — ZOLPIDEM TARTRATE 5 MG PO TABS: 5.0000 mg | ORAL_TABLET | Freq: Every evening | ORAL | Status: DC | PRN

## 2013-08-01 MED ORDER — DIPHENHYDRAMINE HCL 25 MG PO CAPS: 25.0000 mg | ORAL_CAPSULE | Freq: Four times a day (QID) | ORAL | Status: DC | PRN

## 2013-08-01 MED ORDER — MENTHOL 3 MG MT LOZG: 1.0000 | LOZENGE | OROMUCOSAL | Status: DC | PRN

## 2013-08-01 MED ORDER — POLYETHYLENE GLYCOL 3350 17 G PO PACK: 17.0000 g | PACK | Freq: Every day | ORAL | Status: DC | PRN

## 2013-08-01 MED ORDER — ALUM & MAG HYDROXIDE-SIMETH 200-200-20 MG/5ML PO SUSP: 30.0000 mL | ORAL | Status: DC | PRN

## 2013-08-01 NOTE — Anesthesia Postprocedure Evaluation (Signed)
Anesthesia Post Note  Patient: Devin Martin  Procedure(s) Performed: Procedure(s) (LRB): INCISIONAL/NON-INCISIONAL DEBRIDEMENT OF ABSCESS LEFT KNEE (Left)  Anesthesia type: General  Patient location: PACU  Post pain: Pain level controlled  Post assessment: Post-op Vital signs reviewed  Last Vitals:  Filed Vitals:   08/01/13 0551  BP: 133/73  Pulse: 86  Temp: 37.3 C  Resp: 16    Post vital signs: Reviewed  Level of consciousness: sedated  Complications: No apparent anesthesia complications

## 2013-08-01 NOTE — Progress Notes (Signed)
PT NOTE:  PT evaluation order received.  Pt states he has no PT needs at this time - states he has good balance and is very experienced at managing without prosthetic leg.  PT deferred at this time.

## 2013-08-01 NOTE — Progress Notes (Signed)
OT  Note  Patient Details Name: Devin Martin MRN: [id number] DOB: 1945-09-15   Cancelled Treatment:  . Devin Martin Monday with PT regarding this pt.  No OT needs at this time. Will sign off.   Alba Cory 08/01/2013, 12:53 PM

## 2013-08-01 NOTE — Progress Notes (Signed)
Patient ID: Devin Martin, male   DOB: 04-25-1946, 67 y.o.   MRN: [id number] Subjective: 1 Day Post-Op Procedure(s) (LRB): INCISIONAL/NON-INCISIONAL DEBRIDEMENT OF ABSCESS LEFT KNEE (Left)    Patient reports pain as mild.  Complaining of being cold with chills.  No other issues over night  Objective:   VITALS:   Filed Vitals:   08/01/13 0551  BP: 133/73  Pulse: 86  Temp: 99.2 F (37.3 C)  Resp: 16    Incision: dressing C/D/I Surgical dressing left intact this am early (6:15) - see plan  LABS  Recent Labs  07/31/13 0800 08/01/13 0515  HGB 14.2 13.7  HCT 40.2 40.4  WBC 5.4 6.8  PLT 168 158     Recent Labs  07/31/13 0800 08/01/13 0515  NA 137 136  K 4.1 4.4  BUN 24* 13  CREATININE 0.94 0.80  GLUCOSE 114* 97    No results found for this basename: LABPT, INR,  in the last 72 hours   Assessment/Plan: 1 Day Post-Op Procedure(s) (LRB): INCISIONAL/NON-INCISIONAL DEBRIDEMENT OF ABSCESS LEFT KNEE (Left)   Advance diet  Will have rounding team check hi wound later this pm to pull iodoform gauze and redress with gauze and kerlex If he doesn't feel capable of going home or concerned about chills then can keep in hospital on [facility] and plan for D/C in am O/w d/c materials printed out

## 2013-08-01 NOTE — Progress Notes (Signed)
Subjective: 1 Day Post-Op Procedure(s) (LRB): INCISIONAL/NON-INCISIONAL DEBRIDEMENT OF ABSCESS LEFT KNEE (Left) Pt doing well this AM.  Pain well controlled.  Pt reports chills this past evening but does not feel feverish nor have chills persisted to time of exam.  Pt denies nausea, vomiting, chest pain, SOB, or changes in appetite.  Objective: Vital signs in last 24 hours: Temp:  [95.8 F (35.4 C)-100 F (37.8 C)] 99.2 F (37.3 C) (12/07 0551) Pulse Rate:  [43-86] 86 (12/07 0551) Resp:  [14-16] 16 (12/07 0551) BP: (93-133)/(48-73) 133/73 mmHg (12/07 0551) SpO2:  [93 %-100 %] 93 % (12/07 0551)  Intake/Output from previous day: 12/06 0701 - 12/07 0700 In: 4110 [P.O.:1410; I.V.:2300; IV Piggyback:400] Out: 1900 [Urine:1875; Blood:25] Intake/Output this shift: Total I/O In: -  Out: 350 [Urine:350]   Recent Labs  07/31/13 0800 08/01/13 0515  HGB 14.2 13.7    Recent Labs  07/31/13 0800 08/01/13 0515  WBC 5.4 6.8  RBC 4.48 4.46  HCT 40.2 40.4  PLT 168 158    Recent Labs  07/31/13 0800 08/01/13 0515  NA 137 136  K 4.1 4.4  CL 103 101  CO2 23 28  BUN 24* 13  CREATININE 0.94 0.80  GLUCOSE 114* 97  CALCIUM 9.5 9.0   No results found for this basename: LABPT, INR,  in the last 72 hours  WD WN 67 y/o male NAD A/Ox3, appears stated age.  EOMI, mood and affect normal, respirations unlabored.  Dressing C/D/I with no visible drainage present, taken down.  Incision has scant amount of blood draining but is well approximated.  Sutures in good placedment spaced approximately 1 cm apart with iodoform gauze protruding from incision.  Iodoform gauze is fully saturated with blood with no purulent discharge noted on exam.  Skin surrounding incision is hard, warm, and mildly erythematous.  No gross edema of the distal stump noted.  Assessment/Plan: 1 Day Post-Op Procedure(s) (LRB): INCISIONAL/NON-INCISIONAL DEBRIDEMENT OF ABSCESS LEFT KNEE (Left) Today iodoform gauze was removed  and wound redressed.  Pt will continue antibiotics and be discharged home.  All questions and concerns were answered and discussed with pt.  FLOWERS, CHRISTOPHER S 08/01/2013, 10:35 AM

## 2013-08-01 NOTE — Progress Notes (Signed)
Discharged from floor via w/c, spouse with pt. No changes in assessment. Devin Martin   

## 2013-08-02 ENCOUNTER — Encounter (HOSPITAL_COMMUNITY): Payer: Self-pay | Admitting: Orthopedic Surgery

## 2013-08-03 LAB — CULTURE, ROUTINE-ABSCESS

## 2013-08-05 LAB — ANAEROBIC CULTURE

## 2013-08-08 NOTE — Discharge Summary (Signed)
Physician Discharge Summary  Patient ID: Devin Martin MRN: [id number] DOB/AGE: 10/16/1945 67 y.o.  Admit date: 07/31/2013 Discharge date: 08/01/2013   Procedures:  Procedure(s) (LRB): INCISIONAL/NON-INCISIONAL DEBRIDEMENT OF ABSCESS LEFT KNEE (Left)  Attending Physician:  Dr. Durene Romans   Admission Diagnoses:   Left knee abscess   Discharge Diagnoses:  Principal Problem:   Abscess of left leg Active Problems:   Abscess of knee, left  Past Medical History  Diagnosis Date  . CAD (coronary artery disease)     Non-ST segment elevation myocardial infarction in 2005 with drug-eluting stent to the left anterior descending. 08-2009 - non-ST segment elevation myocardial infarction, felt secondary to vasospasm.  Marland Kitchen Hypertension   . Hyperlipidemia   . Calculus, kidney     hx  . Bronchitis, chronic   . Myocardial infarction 2005  . OSA (obstructive sleep apnea)     AHI 68/hr in 2002-uses a cpap  . Arthritis 1968    pt wears a lt bk prosthesis  . Wears glasses   . Pneumonia     hx of   . Complication of anesthesia     itching after preop med  ?name - 2013    HPI: Pt is a 67 y.o. male presented to the office yesterday with a few day history of worsening pain an swelling in popliteal area of left leg. This was closely associated with the contact location of his leg as it bends at prosthesis. Has had these before but this one had gotten worse, more swelling and pain particularly with any contact. No fevers or chills reported.  PCP: Willow Ora, MD   Discharged Condition: good  Hospital Course:  Patient underwent the above stated procedure on 07/31/2013. Patient tolerated the procedure well and brought to the recovery room in good condition and subsequently to the floor.  POD #1 BP: 133/73 ; Pulse: 86 ; Temp: 99.2 F (37.3 C) ; Resp: 16  Patient reports pain as mild. Complaining of being cold with chills. No other issues over night. Neurovascular intact, dorsiflexion/plantar  flexion intact, incision: dressing C/D/I, no cellulitis present and compartment soft.   LABS  Basename    HGB  13.7  HCT  40.4    Discharge Exam: General appearance: alert, cooperative and no distress Extremities: Homans sign is negative, no sign of DVT, no edema, redness or tenderness in the calves or thighs and no ulcers, gangrene or trophic changes  Disposition:    Home or Self Care with follow up in 2 weeks   Follow-up Information   Follow up with Shelda Pal, MD. Call in 1 week.   Specialty:  Orthopedic Surgery   Contact information:   [address] [place] [address] [phone]       Discharge Orders   Future Orders Complete By Expires   Call MD / Call 911  As directed    Comments:     If you experience chest pain or shortness of breath, CALL 911 and be transported to the hospital emergency room.  If you develope a fever above 101 F, pus (white drainage) or increased drainage or redness at the wound, or calf pain, call your surgeon's office.   Call MD / Call 911  As directed    Comments:     If you experience chest pain or shortness of breath, CALL 911 and be transported to the hospital emergency room.  If you develope a fever above 101 F, pus (white drainage) or increased drainage  or redness at the wound, or calf pain, call your surgeon's office.   Constipation Prevention  As directed    Comments:     Drink plenty of fluids.  Prune juice may be helpful.  You may use a stool softener, such as Colace (over the counter) 100 mg twice a day.  Use MiraLax (over the counter) for constipation as needed.   Constipation Prevention  As directed    Comments:     Drink plenty of fluids.  Prune juice may be helpful.  You may use a stool softener, such as Colace (over the counter) 100 mg twice a day.  Use MiraLax (over the counter) for constipation as needed.   Diet - low sodium heart healthy  As directed    Diet - low sodium heart healthy  As directed     Discharge instructions  As directed    Comments:     Daily dressing changes with gauze and tape. Keep the area dry and clean until follow up. Follow up in 2 weeks at [facility]. Call with any questions or concerns.   Increase activity slowly as tolerated  As directed    Non weight bearing  As directed    Comments:     No use of prosthesis until wound has fully healed.   Questions:     Laterality:  left   Extremity:  Lower        Medication List    STOP taking these medications       AMBULATORY NON FORMULARY MEDICATION      TAKE these medications       ADULT ASPIRIN LOW STRENGTH 81 MG EC tablet  Generic drug:  Aspirin  Take 81 mg by mouth every evening.     alum & mag hydroxide-simeth 200-200-20 MG/5ML suspension  Commonly known as:  MAALOX/MYLANTA  Take 30 mLs by mouth every 4 (four) hours as needed for indigestion.     bisacodyl 10 MG suppository  Commonly known as:  DULCOLAX  Place 1 suppository (10 mg total) rectally daily as needed for moderate constipation.     diltiazem 360 MG 24 hr capsule  Commonly known as:  TIAZAC  Take 360 mg by mouth every evening. Patient takes in the evening     diphenhydrAMINE 25 mg capsule  Commonly known as:  BENADRYL  Take 1 capsule (25 mg total) by mouth every 6 (six) hours as needed for itching, allergies or sleep.     DSS 100 MG Caps  Take 100 mg by mouth 2 (two) times daily.     fish oil-omega-3 fatty acids 1000 MG capsule  Take 1 g by mouth every evening.     Glucosamine 500 MG Caps  Take 1 capsule by mouth every evening.     HYDROcodone-acetaminophen 5-325 MG per tablet  Commonly known as:  NORCO  Take 1-2 tablets by mouth every 4 (four) hours as needed for moderate pain.     isosorbide mononitrate 60 MG 24 hr tablet  Commonly known as:  IMDUR  take 1 tablet by mouth once daily     meloxicam 15 MG tablet  Commonly known as:  MOBIC  Take 15 mg by mouth every evening.     menthol-cetylpyridinium 3 MG  lozenge  Commonly known as:  CEPACOL  Take 1 lozenge (3 mg total) by mouth as needed for sore throat (sore throat).     NITROSTAT 0.4 MG SL tablet  Generic drug:  nitroGLYCERIN  PLACE 1  TABLET (0.4 MG TOTAL) UNDER THE TONGUE EVERY 5 (FIVE) MINUTES AS NEEDED FOR CHEST PAIN.     omeprazole 20 MG capsule  Commonly known as:  PRILOSEC  Take 1 capsule (20 mg total) by mouth daily.     ondansetron 4 MG tablet  Commonly known as:  ZOFRAN  Take 1 tablet (4 mg total) by mouth every 6 (six) hours as needed for nausea.     polyethylene glycol packet  Commonly known as:  MIRALAX / GLYCOLAX  Take 17 g by mouth daily as needed for mild constipation.     potassium citrate 10 MEQ (1080 MG) SR tablet  Commonly known as:  UROCIT-K  take 2 tablets by mouth once daily     rosuvastatin 40 MG tablet  Commonly known as:  CRESTOR  Take 1 tablet (40 mg total) by mouth daily.     sulfamethoxazole-trimethoprim 800-160 MG per tablet  Commonly known as:  BACTRIM DS  Take 1 tablet by mouth 2 (two) times daily.     zolpidem 5 MG tablet  Commonly known as:  AMBIEN  Take 1 tablet (5 mg total) by mouth at bedtime as needed for sleep.         Signed: Anastasio Auerbach. Rad Gramling   PAC  08/08/2013, 6:56 PM

## 2013-08-13 ENCOUNTER — Other Ambulatory Visit: Payer: Self-pay | Admitting: Cardiology

## 2013-09-02 ENCOUNTER — Encounter: Payer: Self-pay | Admitting: Internal Medicine

## 2013-09-02 ENCOUNTER — Telehealth: Payer: Self-pay | Admitting: *Deleted

## 2013-09-02 MED ORDER — OSELTAMIVIR PHOSPHATE 75 MG PO CAPS: 75.0000 mg | ORAL_CAPSULE | Freq: Every day | ORAL | Status: DC

## 2013-09-02 NOTE — Telephone Encounter (Signed)
Advise pt: Rx was sent

## 2013-09-02 NOTE — Telephone Encounter (Signed)
Patient called and stated that he left a message on the triage line asking for dr paz to send Tamiflu to his pharmacy. Patient's daughter was diagnosed with the flu and now Mr & Mrs Brechtel feel like they were exposed to it. Although they feel fine right now.

## 2013-09-02 NOTE — Addendum Note (Signed)
Addended by: Kathlene November E on: 09/02/2013 06:14 PM   Modules accepted: Orders

## 2013-09-21 ENCOUNTER — Other Ambulatory Visit: Payer: Self-pay | Admitting: Cardiology

## 2013-10-06 ENCOUNTER — Other Ambulatory Visit: Payer: Self-pay | Admitting: Internal Medicine

## 2013-10-21 ENCOUNTER — Other Ambulatory Visit: Payer: Self-pay | Admitting: Cardiology

## 2013-11-11 ENCOUNTER — Institutional Professional Consult (permissible substitution): Payer: Medicare Other | Admitting: Pulmonary Disease

## 2013-11-18 ENCOUNTER — Other Ambulatory Visit: Payer: Self-pay | Admitting: Internal Medicine

## 2013-11-18 ENCOUNTER — Other Ambulatory Visit: Payer: Self-pay

## 2013-11-18 MED ORDER — DILTIAZEM HCL ER BEADS 360 MG PO CP24: 360.0000 mg | ORAL_CAPSULE | Freq: Every evening | ORAL | Status: DC

## 2013-11-19 ENCOUNTER — Other Ambulatory Visit: Payer: Self-pay

## 2013-11-19 MED ORDER — NITROGLYCERIN 0.4 MG SL SUBL: SUBLINGUAL_TABLET | SUBLINGUAL | Status: DC

## 2013-11-30 ENCOUNTER — Encounter: Payer: Self-pay | Admitting: Pulmonary Disease

## 2013-11-30 ENCOUNTER — Ambulatory Visit (INDEPENDENT_AMBULATORY_CARE_PROVIDER_SITE_OTHER): Payer: Commercial Managed Care - HMO | Admitting: Pulmonary Disease

## 2013-11-30 VITALS — BP 130/78 | HR 60 | Temp 98.0°F | Ht 65.0 in | Wt 193.4 lb

## 2013-11-30 DIAGNOSIS — G4733 Obstructive sleep apnea (adult) (pediatric): Secondary | ICD-10-CM

## 2013-11-30 NOTE — Progress Notes (Signed)
   Subjective:    Patient ID: Devin Martin, male    DOB: November 04, 1945, 68 y.o.   MRN: [id number]  HPI The patient comes in today for followup of his obstructive sleep apnea. He was last seen in 2011, and feels that he has done well with his CPAP device. It is currently over 66 years old, and is starting to making noises and needs to be replaced. His download shows that he is been completely compliant with his CPAP. He also has a new mask, but is not happy with the fit. Overall, he feels that he sleeps well, is rested in the mornings upon arising, and denies any daytime sleepiness. His weight is down 3 pounds from the last visit, and his Epworth score today is only one.   Review of Systems  Constitutional: Negative for fever and unexpected weight change.  HENT: Negative for congestion, dental problem, ear pain, nosebleeds, postnasal drip, rhinorrhea, sinus pressure, sneezing, sore throat and trouble swallowing.   Eyes: Negative for redness and itching.  Respiratory: Positive for shortness of breath. Negative for cough, chest tightness and wheezing.   Cardiovascular: Negative for palpitations and leg swelling.  Gastrointestinal: Negative for nausea and vomiting.  Genitourinary: Negative for dysuria.  Musculoskeletal: Negative for joint swelling.  Skin: Negative for rash.  Neurological: Negative for headaches.  Hematological: Does not bruise/bleed easily.  Psychiatric/Behavioral: Negative for dysphoric mood. The patient is not nervous/anxious.        Objective:   Physical Exam Constitutional:  Well developed, no acute distress  HENT:  Nares patent without discharge, septal deviation to the left.  Oropharynx without exudate, palate and uvula are trimmed.  Eyes:  Perrla, eomi, no scleral icterus  Neck:  No JVD, no TMG  Cardiovascular:  Normal rate, regular rhythm, no rubs or gallops.  No murmurs        Intact distal pulses  Pulmonary :  Normal breath sounds, no stridor or respiratory  distress   No rales, rhonchi, or wheezing  Abdominal:  Soft, nondistended, bowel sounds present.  No tenderness noted.   Musculoskeletal:  No lower extremity edema noted on right, prosthesis on left.   Lymph Nodes:  No cervical lymphadenopathy noted  Skin:  No cyanosis noted  Neurologic:  Alert, appropriate, moves all 4 extremities without obvious deficit.         Assessment & Plan:

## 2013-11-30 NOTE — Assessment & Plan Note (Signed)
The patient has done extremely well on CPAP since the last visit in 2011, and his download shows excellent compliance, adequate control of his AHI, and no significant mask leak. His current machine is over 66 years old, and therefore due for replacement. I've asked him to continue on CPAP, and will set his new device on the automatic setting for comfort. I've also encouraged him to work aggressively on weight loss.

## 2013-11-30 NOTE — Patient Instructions (Signed)
Will send in order for new machine, and to work with you on mask fit. Work on weight loss followup with me again in one year.

## 2013-12-06 ENCOUNTER — Telehealth: Payer: Self-pay | Admitting: Pulmonary Disease

## 2013-12-06 DIAGNOSIS — G4733 Obstructive sleep apnea (adult) (pediatric): Secondary | ICD-10-CM

## 2013-12-06 NOTE — Telephone Encounter (Signed)
Order has been placed.

## 2013-12-06 NOTE — Telephone Encounter (Signed)
I spoke with Melissa and she states they went to set the pt up on auto 5-20 and the pt is concerned about these settings he states that he was on a fixed pressure of 12 and feels that 5 is too low so he is requesting that the order be changed from 5-20 to 10-20? I asked Melissa if they explained to the pt how auto setting works and that it will not stay on 5, that it will give him what he needs. She states they did but the pt still wanted to ask [facility] about changing the settings. Please advise. [place] Bing, CMA

## 2013-12-06 NOTE — Telephone Encounter (Signed)
Why don't we try 8-20 and see if this works for him.

## 2013-12-16 ENCOUNTER — Ambulatory Visit (INDEPENDENT_AMBULATORY_CARE_PROVIDER_SITE_OTHER): Payer: Commercial Managed Care - HMO | Admitting: Family Medicine

## 2013-12-16 ENCOUNTER — Other Ambulatory Visit: Payer: Self-pay | Admitting: Internal Medicine

## 2013-12-16 ENCOUNTER — Encounter: Payer: Self-pay | Admitting: Family Medicine

## 2013-12-16 VITALS — BP 132/76 | HR 63 | Temp 98.0°F | Wt 192.8 lb

## 2013-12-16 DIAGNOSIS — H6690 Otitis media, unspecified, unspecified ear: Secondary | ICD-10-CM

## 2013-12-16 MED ORDER — OFLOXACIN 0.3 % OT SOLN: 10.0000 [drp] | Freq: Every day | OTIC | Status: DC

## 2013-12-16 MED ORDER — SULFAMETHOXAZOLE-TMP DS 800-160 MG PO TABS: 1.0000 | ORAL_TABLET | Freq: Two times a day (BID) | ORAL | Status: DC

## 2013-12-16 NOTE — Progress Notes (Signed)
  Subjective:     Devin Martin is a 68 y.o. male who presents with ear pain and possible ear infection. Symptoms include: left ear pain. Onset of symptoms was 4 weeks ago, and have been gradually worsening since that time. Associated symptoms include: congestion, sinus pressure and irregular bite and dizziness.  Patient denies: achiness, chills, coryza, fever , headache, low grade fever, non productive cough, post nasal drip, productive cough, sneezing and sore throat. He is drinking plenty of fluids.  The following portions of the patient's history were reviewed and updated as appropriate: allergies, current medications, past family history, past medical history, past social history, past surgical history and problem list.  Review of Systems Pertinent items are noted in HPI.   Objective:    BP 132/76  Pulse 63  Temp(Src) 98 F (36.7 C) (Oral)  Wt 192 lb 12.8 oz (87.454 kg)  SpO2 97% General:  alert, cooperative, appears stated age and no distress  Right Ear: diminished mobility, errythema, and dull  Left Ear: diminished mobility, errythema and dull, bulging  Mouth:  lips, mucosa, and tongue normal; teeth and gums normal  Neck: moderate anterior cervical adenopathy, supple, symmetrical, trachea midline and thyroid not enlarged, symmetric, no tenderness/mass/nodules     Assessment:    Bilateral acute otitis media   Plan:    Treatment: Bactrim. OTC analgesia as needed. Fluids, rest, avoid carbonated/alcoholic and caffeinated beverages.  Follow up in 2 weeks if not improving.

## 2013-12-16 NOTE — Patient Instructions (Signed)
Otitis Media, Adult Otitis media is redness, soreness, and swelling (inflammation) of the middle ear. Otitis media may be caused by allergies or, most commonly, by infection. Often it occurs as a complication of the common cold. SIGNS AND SYMPTOMS Symptoms of otitis media may include:  Earache.  Fever.  Ringing in your ear.  Headache.  Leakage of fluid from the ear. DIAGNOSIS To diagnose otitis media, your health care provider will examine your ear with an otoscope. This is an instrument that allows your health care provider to see into your ear in order to examine your eardrum. Your health care provider also will ask you questions about your symptoms. TREATMENT  Typically, otitis media resolves on its own within 3 5 days. Your health care provider may prescribe medicine to ease your symptoms of pain. If otitis media does not resolve within 5 days or is recurrent, your health care provider may prescribe antibiotic medicines if he or she suspects that a bacterial infection is the cause. HOME CARE INSTRUCTIONS   Take your medicine as directed until it is gone, even if you feel better after the first few days.  Only take over-the-counter or prescription medicines for pain, discomfort, or fever as directed by your health care provider.  Follow up with your health care provider as directed. SEEK MEDICAL CARE IF:  You have otitis media only in one ear or bleeding from your nose or both.  You notice a lump on your neck.  You are not getting better in 3 5 days.  You feel worse instead of better. SEEK IMMEDIATE MEDICAL CARE IF:   You have pain that is not controlled with medicine.  You have swelling, redness, or pain around your ear or stiffness in your neck.  You notice that part of your face is paralyzed.  You notice that the bone behind your ear (mastoid) is tender when you touch it. MAKE SURE YOU:   Understand these instructions.  Will watch your condition.  Will get help  right away if you are not doing well or get worse. Document Released: 05/17/2004 Document Revised: 06/02/2013 Document Reviewed: 03/09/2013 Exit[employer] Patient Information 2014 [employer].  

## 2013-12-16 NOTE — Progress Notes (Signed)
Pre visit review using our clinic review tool, if applicable. No additional management support is needed unless otherwise documented below in the visit note. 

## 2013-12-17 ENCOUNTER — Telehealth: Payer: Self-pay | Admitting: Internal Medicine

## 2013-12-17 NOTE — Telephone Encounter (Signed)
Relevant patient education assigned to patient using Emmi. ° °

## 2013-12-21 ENCOUNTER — Telehealth: Payer: Self-pay | Admitting: Internal Medicine

## 2013-12-21 DIAGNOSIS — Z1389 Encounter for screening for other disorder: Secondary | ICD-10-CM

## 2013-12-21 NOTE — Telephone Encounter (Signed)
Caller name: Evon Relation to pt: self Call back number:[phone] Pharmacy:  Reason for call:  Pt is wanting a referral to see a dermatologist, Dr. Baltazar Najjar in [facility].

## 2013-12-21 NOTE — Telephone Encounter (Signed)
Ok , send him to Dr Baltazar Najjar Also, due for a OV, please arrange

## 2013-12-21 NOTE — Telephone Encounter (Signed)
OK to refer.

## 2013-12-21 NOTE — Telephone Encounter (Signed)
Entered referral Pt notified Will call back to schedule his visit.

## 2013-12-21 NOTE — Telephone Encounter (Signed)
Patient has [place] and is requesting appointment w/dermatologist for a skin check. He states he is only concerned because [place] has [place]. He does not have any particular lesions he is worried about. Okay to send referral?

## 2013-12-27 ENCOUNTER — Telehealth: Payer: Self-pay | Admitting: Internal Medicine

## 2013-12-27 NOTE — Telephone Encounter (Signed)
Caller name: Zarek Relph Relation to pt: self Call back number: [phone] Pharmacy: [facility]  Reason for call:  Patient states he has finished his bactrim and his ear infection has not completely cleared. He would like to know if he needs another round of antibiotics or if he needs to be rechecked here in the office. Please advise.

## 2013-12-27 NOTE — Telephone Encounter (Signed)
Lmovm for pt to call office.

## 2013-12-27 NOTE — Telephone Encounter (Signed)
Please advise      KP 

## 2013-12-27 NOTE — Telephone Encounter (Signed)
Should be seen because he may need referral

## 2013-12-29 ENCOUNTER — Ambulatory Visit (INDEPENDENT_AMBULATORY_CARE_PROVIDER_SITE_OTHER): Payer: Commercial Managed Care - HMO | Admitting: Internal Medicine

## 2013-12-29 ENCOUNTER — Encounter: Payer: Self-pay | Admitting: Internal Medicine

## 2013-12-29 ENCOUNTER — Telehealth: Payer: Self-pay | Admitting: Internal Medicine

## 2013-12-29 VITALS — BP 137/73 | HR 70 | Temp 97.0°F | Wt 191.0 lb

## 2013-12-29 DIAGNOSIS — H6690 Otitis media, unspecified, unspecified ear: Secondary | ICD-10-CM

## 2013-12-29 DIAGNOSIS — K219 Gastro-esophageal reflux disease without esophagitis: Secondary | ICD-10-CM

## 2013-12-29 DIAGNOSIS — I10 Essential (primary) hypertension: Secondary | ICD-10-CM

## 2013-12-29 DIAGNOSIS — R42 Dizziness and giddiness: Secondary | ICD-10-CM

## 2013-12-29 DIAGNOSIS — I2510 Atherosclerotic heart disease of native coronary artery without angina pectoris: Secondary | ICD-10-CM

## 2013-12-29 LAB — COMPREHENSIVE METABOLIC PANEL
ALBUMIN: 4.3 g/dL (ref 3.5–5.2)
ALK PHOS: 61 U/L (ref 39–117)
ALT: 30 U/L (ref 0–53)
AST: 22 U/L (ref 0–37)
BUN: 20 mg/dL (ref 6–23)
CALCIUM: 9.3 mg/dL (ref 8.4–10.5)
CHLORIDE: 105 meq/L (ref 96–112)
CO2: 26 mEq/L (ref 19–32)
Creatinine, Ser: 1 mg/dL (ref 0.4–1.5)
GFR: 81.3 mL/min (ref >60.00)
Glucose, Bld: 93 mg/dL (ref 70–99)
POTASSIUM: 4.5 meq/L (ref 3.5–5.1)
SODIUM: 138 meq/L (ref 135–145)
TOTAL PROTEIN: 6.6 g/dL (ref 6.0–8.3)
Total Bilirubin: 0.7 mg/dL (ref 0.2–1.2)

## 2013-12-29 MED ORDER — OMEPRAZOLE 20 MG PO CPDR: DELAYED_RELEASE_CAPSULE | ORAL | Status: DC

## 2013-12-29 NOTE — Assessment & Plan Note (Signed)
RF meds

## 2013-12-29 NOTE — Assessment & Plan Note (Signed)
Good compliance with meds  . BP seems very good. Labs

## 2013-12-29 NOTE — Progress Notes (Signed)
Pre visit review using our clinic review tool, if applicable. No additional management support is needed unless otherwise documented below in the visit note. 
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
## 2013-12-29 NOTE — Patient Instructions (Signed)
Get your blood work before you leave   Next visit is for a physical exam in 4 months  ,  fasting Please make an appointment    Use OTC Nasocort: 2 nasal sprays on each side of the nose daily x 2 months

## 2013-12-29 NOTE — Telephone Encounter (Signed)
Relevant patient education assigned to patient using Emmi. ° °

## 2013-12-31 ENCOUNTER — Ambulatory Visit (INDEPENDENT_AMBULATORY_CARE_PROVIDER_SITE_OTHER): Payer: Commercial Managed Care - HMO | Admitting: Internal Medicine

## 2013-12-31 ENCOUNTER — Encounter: Payer: Self-pay | Admitting: Internal Medicine

## 2013-12-31 VITALS — BP 128/63 | HR 59 | Temp 97.6°F | Wt 191.2 lb

## 2013-12-31 DIAGNOSIS — M1711 Unilateral primary osteoarthritis, right knee: Secondary | ICD-10-CM | POA: Insufficient documentation

## 2013-12-31 DIAGNOSIS — M1990 Unspecified osteoarthritis, unspecified site: Secondary | ICD-10-CM

## 2013-12-31 MED ORDER — HYDROCODONE-ACETAMINOPHEN 5-325 MG PO TABS: 1.0000 | ORAL_TABLET | Freq: Three times a day (TID) | ORAL | Status: DC | PRN

## 2013-12-31 MED ORDER — PREDNISONE 10 MG PO TABS: ORAL_TABLET | ORAL | Status: DC

## 2013-12-31 NOTE — Progress Notes (Signed)
Pre visit review using our clinic review tool, if applicable. No additional management support is needed unless otherwise documented below in the visit note. 

## 2013-12-31 NOTE — Assessment & Plan Note (Signed)
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 Will treat w/ steroids, Tylenol Vicodin ( reports good tolerance to Vicodin despite allergies).  if not better will call for orthopedic referral [facility])

## 2013-12-31 NOTE — Patient Instructions (Signed)
Ice pack twice a day as needed Take prednisone as prescribed If the pain is moderate take Tylenol, if the pain is severe take Vicodin instead. (Don't take both, vicodin has Tylenol in it already) Call if not improving in a week or 2
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
## 2014-01-04 ENCOUNTER — Telehealth: Payer: Self-pay | Admitting: Internal Medicine

## 2014-01-04 MED ORDER — DICLOFENAC POTASSIUM 50 MG PO TABS: 50.0000 mg | ORAL_TABLET | Freq: Three times a day (TID) | ORAL | Status: DC | PRN

## 2014-01-04 NOTE — Telephone Encounter (Signed)
Caller name: Relation to pt: Call back [phone] Pharmacy:[id number] [place], [place] [facility] later this evening.  Reason for call:  Pt is almost out of RX predniSONE (DELTASONE) 10 MG and he states that it has not been helping.  Pt states that he use to take a RX diclofenac sodium and he says that use to work for him.  Pt is going out of town to the beach this afternoon and wants to know if we can get something called in for him.  Please contact pt so he can either pick it up here or at the beach.  Thanks.

## 2014-01-04 NOTE — Telephone Encounter (Signed)
Please see notes below:  Recommendations please?

## 2014-01-04 NOTE — Telephone Encounter (Signed)
LM on voice mail of Rx sent.  Sent to indicated pharmacy.  Advised to return the call if he needed to discuss further.

## 2014-01-04 NOTE — Telephone Encounter (Signed)
Pt called back and gave pharmacy location  [facility] [address] [place], [place] [address]  [phone]  PT would also like a call back from [facility]

## 2014-01-04 NOTE — Telephone Encounter (Signed)
LM for patient to return the call. Need to know the exact location of the [facility].

## 2014-01-04 NOTE — Telephone Encounter (Signed)
01/04/14  Pt called back :   Pt wants to know if his pain needs to be addressed by a pain management center.  Pt states that he needs some relief and will do whatever Dr. Larose Kells thinks.  He will back from beach next week and can go to whatever appointments we can get made.

## 2014-01-04 NOTE — Telephone Encounter (Signed)
A prescription for diclofenac is ready  to be sent to the pharmacy of choice Remind patient to take it with food and stop if he has any symptoms consistent with gastritis. Arrange a orthopedic surgical referral , [facility]

## 2014-01-24 ENCOUNTER — Telehealth: Payer: Self-pay | Admitting: Pulmonary Disease

## 2014-01-24 DIAGNOSIS — G4733 Obstructive sleep apnea (adult) (pediatric): Secondary | ICD-10-CM

## 2014-01-24 NOTE — Telephone Encounter (Signed)
Spoke with the pt  He states that his spouse told him that he is still snoring during sleep  He states that this was going on in April when he was seen here and is no better with new CPAP machine  He denies any issues with the mask or new machine  Would like to know what [place] thinks Please advise, thanks!

## 2014-01-25 NOTE — Telephone Encounter (Signed)
Will need to get a download off airview for last 4 weeks to see what is going on.  Give to me and I can review  If he is on auto and still snoring, this means he has a leak in the system somewhere.  Either he is mouth opening if using nasal mask and losing pressure, or he is leaking around the mask if using a full face mask.

## 2014-01-25 NOTE — Telephone Encounter (Signed)
Pt calling to check on status of earlier msg, to see if dr clance has had a chance to go over his #'s to see why he is snoring still, pls call pt with update.Devin Martin

## 2014-01-25 NOTE — Telephone Encounter (Signed)
Pt aware we are awaiting response. Please advise thanks

## 2014-01-26 NOTE — Telephone Encounter (Signed)
Report has been placed in [place] at for review. Please advise thanks

## 2014-01-27 NOTE — Telephone Encounter (Signed)
Pt called back. He is fine with this order for mask fit. Nothing further needed

## 2014-01-27 NOTE — Telephone Encounter (Signed)
lmomtcb x1 

## 2014-01-27 NOTE — Telephone Encounter (Signed)
Let pt know that his apnea is being well controlled, but he has significant mask leak that is probably causing his snoring.  Would recommend a visit to sleep center for mask fit with [facility].  If he is willing, order [id number]

## 2014-01-31 ENCOUNTER — Telehealth: Payer: Self-pay | Admitting: Pulmonary Disease

## 2014-01-31 NOTE — Telephone Encounter (Signed)
Mask Fit Appointment at the sleep lab was scheduled for patient on Wed. 02/02/14 at 2:00. The order was not marked urgent or stat. The phone message didn't indicate patient was having surgery on Monday. I attempted to contact patient on Friday 01/28/14 and left message on patient's answering machine that I had the appointment scheduled to contact me back on my direct phone number of [phone]. Pt returned the call on Friday 01/28/14 at 7:10 pm, then again on Sat 01/29/14 at 7:30 pm. Pt stated in his message that he was having surgery on Monday morning and needed this mask fit done as soon as surgery was finished on Monday. [facility]'s first available appointment for a mask fit was Wed. 02/02/14, I called over to the sleep lab to see if patient could be seen today, however, Lynnae Sandhoff has a patient today and would be unable to do a mask fit today. Called Melissa at [facility] and she is working on having patient to go to [facility] at the [address] location to accommodate patient for mask fit after surgery today. Melissa will let me know. Mardene Celeste the RT is out today however, [facility] will see if another RT can meet patient at the [facility] for the mask fit. Rhonda J Cobb

## 2014-02-01 NOTE — Telephone Encounter (Signed)
Pt went by [facility] on [address] after his surgery Monday and spoke with [place] at [facility]. Pt was supplied the mask by [facility]. Called and spoke with patient and he stated that he had a mask for tonight. Nothing else needed at this time. Rhonda J Cobb

## 2014-02-02 ENCOUNTER — Other Ambulatory Visit (HOSPITAL_BASED_OUTPATIENT_CLINIC_OR_DEPARTMENT_OTHER): Payer: Commercial Managed Care - HMO

## 2014-02-09 ENCOUNTER — Other Ambulatory Visit: Payer: Self-pay | Admitting: *Deleted

## 2014-02-09 MED ORDER — ISOSORBIDE MONONITRATE ER 60 MG PO TB24: ORAL_TABLET | ORAL | Status: DC

## 2014-03-30 ENCOUNTER — Other Ambulatory Visit: Payer: Self-pay | Admitting: *Deleted

## 2014-03-30 MED ORDER — ISOSORBIDE MONONITRATE ER 60 MG PO TB24: ORAL_TABLET | ORAL | Status: DC

## 2014-04-18 ENCOUNTER — Telehealth: Payer: Self-pay | Admitting: Internal Medicine

## 2014-04-18 ENCOUNTER — Other Ambulatory Visit: Payer: Self-pay

## 2014-04-18 MED ORDER — ROSUVASTATIN CALCIUM 40 MG PO TABS: 40.0000 mg | ORAL_TABLET | Freq: Every day | ORAL | Status: DC

## 2014-04-18 MED ORDER — OMEPRAZOLE 20 MG PO CPDR: DELAYED_RELEASE_CAPSULE | ORAL | Status: DC

## 2014-04-18 MED ORDER — POTASSIUM CITRATE ER 10 MEQ (1080 MG) PO TBCR: EXTENDED_RELEASE_TABLET | ORAL | Status: DC

## 2014-04-18 MED ORDER — NITROGLYCERIN 0.4 MG SL SUBL: SUBLINGUAL_TABLET | SUBLINGUAL | Status: DC

## 2014-04-18 MED ORDER — ISOSORBIDE MONONITRATE ER 60 MG PO TB24: ORAL_TABLET | ORAL | Status: DC

## 2014-04-18 NOTE — Telephone Encounter (Signed)
Diltiazem dose was changed 11/30/2013 at a pulmonary visit, reason?. If he is doing well with 240 mg i suggest he remains  on that dose

## 2014-04-18 NOTE — Telephone Encounter (Signed)
Please advise 

## 2014-04-18 NOTE — Telephone Encounter (Signed)
Spoke with Pt, and medication has been changed back to 340 mg.

## 2014-04-18 NOTE — Telephone Encounter (Signed)
Caller name: Krystal Relation to pt: self  Call back number: [phone]   Reason for call:   pt in need of clinical advice regarding he's diltiazem (DILACOR XR) 240 MG it was orignaly 340 MG in need of clarification of why it was changed.   In addition pt had to rescheduled he's pyhsical appointment due to work schedule and stated he needed an appointment right away therefore he scheduled an appointment with Percell Miller 05/03/14.

## 2014-04-28 ENCOUNTER — Encounter: Payer: Commercial Managed Care - HMO | Admitting: Internal Medicine

## 2014-05-03 ENCOUNTER — Encounter: Payer: Self-pay | Admitting: Medical

## 2014-05-03 ENCOUNTER — Ambulatory Visit (INDEPENDENT_AMBULATORY_CARE_PROVIDER_SITE_OTHER): Payer: Commercial Managed Care - HMO | Admitting: Medical

## 2014-05-03 VITALS — BP 122/68 | HR 57 | Temp 97.9°F | Ht 65.0 in | Wt 195.4 lb

## 2014-05-03 DIAGNOSIS — Z0000 Encounter for general adult medical examination without abnormal findings: Secondary | ICD-10-CM

## 2014-05-03 DIAGNOSIS — Z23 Encounter for immunization: Secondary | ICD-10-CM

## 2014-05-03 LAB — COMPREHENSIVE METABOLIC PANEL
ALBUMIN: 4.4 g/dL (ref 3.5–5.2)
ALK PHOS: 54 U/L (ref 39–117)
ALT: 30 U/L (ref 0–53)
AST: 23 U/L (ref 0–37)
BUN: 18 mg/dL (ref 6–23)
CO2: 29 mEq/L (ref 19–32)
Calcium: 9.2 mg/dL (ref 8.4–10.5)
Chloride: 103 mEq/L (ref 96–112)
Creatinine, Ser: 0.8 mg/dL (ref 0.4–1.5)
GFR: 102.65 mL/min (ref >60.00)
Glucose, Bld: 97 mg/dL (ref 70–99)
POTASSIUM: 4.2 meq/L (ref 3.5–5.1)
Sodium: 138 mEq/L (ref 135–145)
Total Bilirubin: 1.3 mg/dL — ABNORMAL HIGH (ref 0.2–1.2)
Total Protein: 7.2 g/dL (ref 6.0–8.3)

## 2014-05-03 LAB — LIPID PANEL
CHOL/HDL RATIO: 2
Cholesterol: 119 mg/dL (ref 0–200)
HDL: 56.4 mg/dL (ref >39.00)
LDL CALC: 45 mg/dL (ref 0–99)
NONHDL: 62.6
Triglycerides: 88 mg/dL (ref 0.0–149.0)
VLDL: 17.6 mg/dL (ref 0.0–40.0)

## 2014-05-03 MED ORDER — ZOSTER VACCINE LIVE 19400 UNT/0.65ML ~~LOC~~ SOLR: 0.6500 mL | Freq: Once | SUBCUTANEOUS | Status: DC

## 2014-05-03 NOTE — Patient Instructions (Addendum)
I want you to get labs done today for your wellness and to check fasting labs today for cholesterol check and htn. I sent shingles vaccine to your pharmacy. I want you to consider flu vaccine in October. I offered xrays today and you declinePreventive Dental Care Preventative dental care is an important part of overall health for children and adults. Regular care of the teeth and gums can prevent tooth decay and gum disease. Substances are created in your mouth every day that need to be removed, including:  Plaque. This is a sticky substance around the teeth and gums.  Tartar. This is a hardened form of plaque. You can remove plaque and tartar by brushing and flossing. Brushing and flossing will also help prevent cavities, gingivitis, and tooth loss. Your dentist can remove the plaque and tartar not removed with regular daily care. A dentist can also find other dental problems. Problems that are detected early can be treated conservatively and with less expense. BABIES, TODDLERS, AND PRESCHOOL AGED CHILDREN  Clean your baby's gums after feedings with a wet washcloth (water only).  Do not give your baby a bottle with milk, formula, or juice for sipping before they go to sleep.  Brush your baby's emerging teeth with a small toothbrush and water 2 times per day.  Begin flossing your baby's teeth when they are in contact with one another.  Many dental problems are preventable with early assessment. Take your child to a dentist when the first tooth erupts or by age 74. Discuss risks for tooth decay, your child's growth and development, fluoride needs, oral habits, proper diet, and oral hygiene.  Bring your child to the dentist every 6 months.  Even when your child is over age 61, brush your child's teeth for him or her. Use a small pea-sized amount of fluoride toothpaste. Make sure your child does not swallow the toothpaste.  Contact your dentist if your child has pain or other problems in the  mouth. SCHOOL AGED CHILDREN AND ADOLESCENTS  Continue to brush your child's teeth 2 times per day with a child toothbrush and pea-sized amount of toothpaste until your child is 66 to 47 years old.  Help your child floss until he or she can do it properly.  Bring your child to the dentist every 6 months for professional cleanings and exams.  If your child is involved in contact sports, make sure he or she wears a properly fitted mouth guard.  Ask your dentist about dental sealants. This is a coating painted onto the molars to prevent decay.   Encourage a healthy diet. Help your child limit sugary drinks and foods.  Make sure your child has an early orthodontic evaluation. Your child should have an evaluation by about age 12. Many orthodontic problems can be treated early, preventing the need for full fixed braces in the future.  Talk to your adolescent about the risks of oral piercings and smoking. Help your child avoid these risks.  Contact your dental caregiver if your child has pain or other problems in the mouth. ADULTS  Brush at least 2 times per day and after meals if possible. Brush for at least 2 minutes.  Use a fluoride toothpaste or drink fluoridated water.  Floss daily.  Keep retainers, dentures, or other mouth devices clean and sanitized. Soak or brush them as directed.  Eat a healthy diet. Limit sugary drinks and foods.  See your dentist every 6 months.  Follow up with your dentist as directed.  Always see your dentist at the first sign of tooth or gum pain.  Avoid smoking.  Limit alcohol. ELDERLY OR THOSE WITH A CHRONIC HEALTH CONDITION Follow the adult guidelines above, and in addition:  Manage chronic conditions, such as diabetes. Certain conditions can increase your risk of gum disease.  If you experience dry mouth from medicines, ask your caregiver about treatment options. Try drinking more water and chewing sugarless gum. Avoid alcohol.  Visit your  dentist prior to cancer treatment to take care of any problems. SEEK IMMEDIATE DENTAL CARE IF:  You develop pain, bleeding, or soreness in the gum, tooth, jaw, or mouth area.  A permanent tooth becomes loose or separated from the gum socket.  You experience a blow or injury to the mouth or jaw area. Document Released: 12/07/2010 Document Revised: 11/04/2011 Document Reviewed: 12/07/2010 [facility] Patient Information 2015 [place], [place]. This information is not intended to replace advice given to you by your health care provider. Make sure you discuss any questions you have with your health care provider. . I made you a copy of preventative recommendations.

## 2014-05-03 NOTE — Progress Notes (Signed)
Subjective:    Patient ID: Devin Martin, male    DOB: Feb 15, 1946, 68 y.o.   MRN: [id number]  HPI  Ptin for wellness. He is up to date on tetanus, colonsocpy and pneumovaccine. I put order for zostovax today and encouraged to get fluvaccine in October early. Pt had ekg done in July of 2014. For full review see medicare smart set.  Last year hip and back hurting more but has been moderate to severe at times over past year. Diclofenac usually is adequate occasionally not adequate.    Review of Systems   see ros medicare. Objective:   Physical Exam  See pe medicare.        Assessment & Plan:   Subjective:    Devin Martin is a 68 y.o. male who presents for Medicare Annual/Subsequent preventive examination.   Preventive Screening-Counseling & Management  Tobacco History  Smoking status  . Current Some Day Smoker -- 0.75 packs/day for 20 years  . Types: Cigarettes  Smokeless tobacco  . Never Used    Comment: currently doing some e-cigarrets (started at age 36. less than 1 ppd.)     Problems Prior to Visit 1.   Current Problems (verified) Patient Active Problem List   Diagnosis Date Noted  . DJD (degenerative joint disease) 12/31/2013  . Annual physical exam 08/15/2011  . Hypogonadism male 08/12/2011  . GERD 04/05/2010  . CHEST PAIN 03/07/2010  . ANXIETY 10/13/2009  . DYSPNEA 10/13/2009  . ABDOMINAL BRUIT 01/27/2009  . HYPERTENSION 01/26/2009  . HYPERLIPIDEMIA 06/23/2007  . OBSTRUCTIVE SLEEP APNEA 06/23/2007  . CORONARY ARTERY DISEASE 06/23/2007  . BRONCHITIS, CHRONIC NOS 06/17/2007  . CALCULUS, KIDNEY 06/17/2007  . 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 SPLENECTOMY, HX OF 06/17/2007    Medications Prior to Visit Current Outpatient Prescriptions on File Prior to Visit  Medication Sig Dispense Refill  . Aspirin (ADULT ASPIRIN LOW STRENGTH) 81 MG EC tablet Take 81 mg by mouth every evening.       . diclofenac (CATAFLAM) 50 MG  tablet Take 1 tablet (50 mg total) by mouth 3 (three) times daily as needed.  30 tablet  0  . diltiazem (DILACOR XR) 240 MG 24 hr capsule Take 240 mg by mouth daily.      . fish oil-omega-3 fatty acids 1000 MG capsule Take 1 g by mouth every evening.       Marland Kitchen HYDROcodone-acetaminophen (NORCO/VICODIN) 5-325 MG per tablet Take 1-2 tablets by mouth every 8 (eight) hours as needed.  30 tablet  0  . isosorbide mononitrate (IMDUR) 60 MG 24 hr tablet TAKE 1 TABLET BY MOUTH EVERY DAY  30 tablet  1  . nitroGLYCERIN (NITROSTAT) 0.4 MG SL tablet PLACE 1 TABLET (0.4 MG TOTAL) UNDER THE TONGUE EVERY 5 (FIVE) MINUTES AS NEEDED FOR CHEST PAIN.  25 tablet  1  . omeprazole (PRILOSEC) 20 MG capsule TAKE ONE CAPSULE BY MOUTH EVERY DAY  30 capsule  1  . potassium citrate (UROCIT-K) 10 MEQ (1080 MG) SR tablet TAKE 2 TABLETS BY MOUTH EVERY DAY  60 tablet  1  . rosuvastatin (CRESTOR) 40 MG tablet Take 1 tablet (40 mg total) by mouth daily.  30 tablet  1  . predniSONE (DELTASONE) 10 MG tablet 4 tablets x 2 days, 3 tabs x 2 days, 2 tabs x 2 days, 1 tab x 2 days  20 tablet  0   No current facility-administered medications on file prior to visit.  Current Medications (verified) Current Outpatient Prescriptions  Medication Sig Dispense Refill  . Aspirin (ADULT ASPIRIN LOW STRENGTH) 81 MG EC tablet Take 81 mg by mouth every evening.       . diclofenac (CATAFLAM) 50 MG tablet Take 1 tablet (50 mg total) by mouth 3 (three) times daily as needed.  30 tablet  0  . diltiazem (DILACOR XR) 240 MG 24 hr capsule Take 240 mg by mouth daily.      . fish oil-omega-3 fatty acids 1000 MG capsule Take 1 g by mouth every evening.       Marland Kitchen HYDROcodone-acetaminophen (NORCO/VICODIN) 5-325 MG per tablet Take 1-2 tablets by mouth every 8 (eight) hours as needed.  30 tablet  0  . isosorbide mononitrate (IMDUR) 60 MG 24 hr tablet TAKE 1 TABLET BY MOUTH EVERY DAY  30 tablet  1  . nitroGLYCERIN (NITROSTAT) 0.4 MG SL tablet PLACE 1 TABLET (0.4 MG  TOTAL) UNDER THE TONGUE EVERY 5 (FIVE) MINUTES AS NEEDED FOR CHEST PAIN.  25 tablet  1  . omeprazole (PRILOSEC) 20 MG capsule TAKE ONE CAPSULE BY MOUTH EVERY DAY  30 capsule  1  . potassium citrate (UROCIT-K) 10 MEQ (1080 MG) SR tablet TAKE 2 TABLETS BY MOUTH EVERY DAY  60 tablet  1  . rosuvastatin (CRESTOR) 40 MG tablet Take 1 tablet (40 mg total) by mouth daily.  30 tablet  1  . predniSONE (DELTASONE) 10 MG tablet 4 tablets x 2 days, 3 tabs x 2 days, 2 tabs x 2 days, 1 tab x 2 days  20 tablet  0   No current facility-administered medications for this visit.     Allergies (verified) Codeine; Oxycodone-acetaminophen; Penicillins; and Tylox   PAST HISTORY  Family History Family History  Problem Relation Age of Onset  . Colon cancer Neg Hx   . Prostate cancer Neg Hx   . Diabetes Neg Hx   . Heart attack Father   . Heart attack Paternal Grandfather   . Cancer Mother     "male ca"    Social History History  Substance Use Topics  . Smoking status: Current Some Day Smoker -- 0.75 packs/day for 20 years    Types: Cigarettes  . Smokeless tobacco: Never Used     Comment: currently doing some e-cigarrets (started at age 27. less than 1 ppd.)   . Alcohol Use: Yes     Comment: occasionally     Are there smokers in your home (other than you)?  Yes  Risk Factors Current exercise habits: He does ellipitical 25-30 mintues every day. Weights also.  Dietary issues discussed: None.   Cardiac risk factors: dyslipidemia, family history of premature cardiovascular disease, hypertension and male gender.  Depression Screen (Note: if answer to either of the following is "Yes", a more complete depression screening is indicated)   Q1: Over the past two weeks, have you felt down, depressed or hopeless? No  Q2: Over the past two weeks, have you felt little interest or pleasure in doing things? No  Have you lost interest or pleasure in daily life? No  Do you often feel hopeless? No  Do you cry  easily over simple problems? No  Activities of Daily Living In your present state of health, do you have any difficulty performing the following activities?:  Driving? No Managing money?  No Feeding yourself?  yes Getting from bed to chair? No Climbing a flight of stairs? No Preparing food and eating?: No Bathing or showeriNong?  No Getting dressed: No Getting to the toilet? No Using the toilet:No Moving around from place to place: No In the past year have you fallen or had a near fall?:No   Are you sexually active?  No  Do you have more than one partner?  No  Hearing Difficulties: No Do you often ask people to speak up or repeat themselves? No Do you experience ringing or noises in your ears? No Do you have difficulty understanding soft or whispered voices? No   Do you feel that you have a problem with memory? No  Do you often misplace items? No  Do you feel safe at home?  Yes  Cognitive Testing  Alert? Yes  Normal Appearance?Yes  Oriented to person? Yes  Place? Yes   Time? Yes  Recall of three objects?  Yes  Can perform simple calculations? Yes  Displays appropriate judgment?Yes  Can read the correct time from a watch face?Yes   Advanced Directives have been discussed with the patient? No   List the Names of Other Physician/Practitioners you currently use: 1.  Dr. [name] Cardiologist. Dr. Annia Friendly. Dr. Baltazar Najjar [facility].  Indicate any recent Medical Services you may have received from other than [facility] providers in the past year (date may be approximate).  Immunization History  Administered Date(s) Administered  . HiB (PRP-OMP) 06/23/2007  . Influenza Split 08/15/2011  . Meningococcal Polysaccharide 06/23/2007  . Pneumococcal Polysaccharide-23 01/29/2013  . Td 08/26/2006    Screening Tests Health Maintenance  Topic Date Due  . Influenza Vaccine  03/26/2014  . Zostavax  01/01/2015 (Originally 11/15/2007)  . Tetanus/tdap  08/26/2016  . Colonoscopy  08/15/2019   . Pneumococcal Polysaccharide Vaccine Age 89 And Over  Completed    All answers were reviewed with the patient and necessary referrals were made:  Mackie Pai, PA-C   05/03/2014   History reviewed: allergies, current medications, past family history, past medical history, past social history, past surgical history and problem list  Review of Systems Review of symptoms except hip and back pain.    Objective:     Vision by Snellen chart: right eye:20/20, left eye:20/20 Blood pressure 122/68, pulse 57, temperature 97.9 F (36.6 C), temperature source Oral, height 5\' 5"  (1.651 m), weight 195 lb 6.4 oz (88.633 kg), SpO2 96.00%. Body mass index is 32.52 kg/(m^2).  General Mental Status- Alert. Orientation- Oriented x3.  Build and Nutrition- Well nourished and Well Developed.  Skin General:-Normal. Color- Normal color. Moisture- Normal. Temperature-Warm.  HEENT  Ears- Normal. Auditory Canal- Bilateral-Normal. Tympanic Membrane- Bilateral-Normal. Eye Fundi-Bilateral-Normal. Pupil- bilateral- Direct reaction to light normal. Nose & Sinuses- Normal. Nostrils-Bilateral- Normal. Mouth & Throat-Normal.  Neck Neck- No Bruits or Masses. Trachea midline.  Thyroid- Normal.  Chest and Lung Exam Percussion: Quality and Intensity-Percussion normal. Percussion of the chest reveals- No Dullness.  Palpation: Palpation of the chest reveals- Non-tender- No dullness. Auscultation: Breath Sounds- Normal.  Adventitous Sounds:-No adventitious sounds.  Cardiovascular Inspection:- No Heaves. Auscultation:-Normal sinus rhythm without murmur gallop, S1 WNL and S2 WNL.  Abdomen Inspection:-Inspection Normal. Inspection of the abdomen reveals- No hernias Palpation/Percussion:- Palpation and Percussion of the Abdomen reveal- Non Tender and No Palpable abdominal masses. Liver: Other Characteristics- No hepatomegaly. Spleen:Other Characteristics- No Splenomegaly. Auscultation:- Auscultation of  the abdomen reveals- Bowel sounds normal and No Abdominal bruits.    Back- Mild mid lumbosacral tenderness to palpation. No pain on straight leg lift.  Hips- Lt hip from, no crepitus. Mild pain presently. Rt hip- from with minimal  pain no pain today.  Left lower ext- Pt wearing a prosthesis.      Neurologic Mental Status:- Normal. Cranial Nerves:-Normal Bilaterally. Motor:-Normal. Strength:5/5 normal muscle strength-All Muscles. General Assessment of Reflexes: Right Knee-2+. Left Knee- 2+. Coordination-Normal. Gait- Normal.  Meningeal Signs- None.  Musculoskeletal Global Assessment General-Joints show full range of motion without obvious deformity and Normal muscle mass. Strength in upper and lower extremities.  Lymphatics General lymphatics Description- No generalized lymphadenopathy.     Assessment:     Patient presents for yearly preventative medicine examination. Medicare questionnaire was completed  All immunizations and health maintenance protocols were reviewed with the patient and needed orders were placed.  Appropriate screening laboratory values were ordered for the patient including screening of hyperlipidemia, renal function and hepatic function. If indicated by BPH, a PSA was ordered.  Medication reconciliation,  past medical history, social history, problem list and allergies were reviewed in detail with the patient  Goals were established with regard to weight loss, exercise, and  diet in compliance with medications  End of life planning was discussed.      Plan:     During the course of the visit the patient was educated and counseled about appropriate screening and preventive services including:    Influenza vaccine  Diet review for nutrition referral? Yes _x___  Not Indicated ____   Patient Instructions (the written plan) was given to the patient.  Medicare Attestation I have personally reviewed: The patient's medical and social  history Their use of alcohol, tobacco or illicit drugs Their current medications and supplements The patient's functional ability including ADLs,fall risks, home safety risks, cognitive, and hearing and visual impairment Diet and physical activities Evidence for depression or mood disorders  The patient's weight, height, BMI, and visual acuity have been recorded in the chart.  I have made referrals, counseling, and provided education to the patient based on review of the above and I have provided the patient with a written personalized care plan for preventive services.     Mackie Pai, PA-C   05/03/2014

## 2014-05-04 ENCOUNTER — Ambulatory Visit (HOSPITAL_BASED_OUTPATIENT_CLINIC_OR_DEPARTMENT_OTHER)
Admission: RE | Admit: 2014-05-04 | Discharge: 2014-05-04 | Disposition: A | Discharge status: Home / Self Care | Payer: BC Managed Care – PPO | Source: Ambulatory Visit | Attending: Cardiology | Admitting: Cardiology

## 2014-05-04 ENCOUNTER — Other Ambulatory Visit: Payer: Self-pay | Admitting: Cardiology

## 2014-05-04 ENCOUNTER — Encounter: Payer: Self-pay | Admitting: Medical

## 2014-05-04 ENCOUNTER — Ambulatory Visit (INDEPENDENT_AMBULATORY_CARE_PROVIDER_SITE_OTHER): Payer: Commercial Managed Care - HMO | Admitting: Cardiology

## 2014-05-04 ENCOUNTER — Encounter: Payer: Self-pay | Admitting: Cardiology

## 2014-05-04 VITALS — BP 132/66 | HR 66 | Ht 65.0 in | Wt 193.4 lb

## 2014-05-04 DIAGNOSIS — I10 Essential (primary) hypertension: Secondary | ICD-10-CM

## 2014-05-04 DIAGNOSIS — R0989 Other specified symptoms and signs involving the circulatory and respiratory systems: Secondary | ICD-10-CM

## 2014-05-04 DIAGNOSIS — E785 Hyperlipidemia, unspecified: Secondary | ICD-10-CM

## 2014-05-04 DIAGNOSIS — I2510 Atherosclerotic heart disease of native coronary artery without angina pectoris: Secondary | ICD-10-CM

## 2014-05-04 MED ORDER — ROSUVASTATIN CALCIUM 40 MG PO TABS: 40.0000 mg | ORAL_TABLET | Freq: Every day | ORAL | Status: DC

## 2014-05-04 MED ORDER — ISOSORBIDE MONONITRATE ER 60 MG PO TB24: ORAL_TABLET | ORAL | Status: DC

## 2014-05-04 MED ORDER — OMEPRAZOLE 20 MG PO CPDR: DELAYED_RELEASE_CAPSULE | ORAL | Status: DC

## 2014-05-04 MED ORDER — POTASSIUM CITRATE ER 10 MEQ (1080 MG) PO TBCR: EXTENDED_RELEASE_TABLET | ORAL | Status: DC

## 2014-05-04 MED ORDER — DILTIAZEM HCL ER 240 MG PO CP24: 240.0000 mg | ORAL_CAPSULE | Freq: Every day | ORAL | Status: DC

## 2014-05-04 NOTE — Assessment & Plan Note (Signed)
Continue aspirin and statin. 

## 2014-05-04 NOTE — Assessment & Plan Note (Signed)
Blood pressure controlled. Continue present medications. 
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
## 2014-05-04 NOTE — Patient Instructions (Signed)
ABDOMINAL U/S ; DX BRUIT, R/O AAA TODAY AT 3:30  Your physician wants you to follow-up in: [place] DR. CRENSHAW You will receive a reminder letter in the mail two months in advance. If you don't receive a letter, please call our office to schedule the follow-up appointment.

## 2014-05-04 NOTE — Assessment & Plan Note (Signed)
Continue statin. Lipids and liver monitored by primary care. 

## 2014-05-04 NOTE — Assessment & Plan Note (Signed)
Schedule ultrasound to exclude aneurysm. 

## 2014-05-27 ENCOUNTER — Encounter: Payer: Self-pay | Admitting: Internal Medicine

## 2014-06-06 ENCOUNTER — Ambulatory Visit (INDEPENDENT_AMBULATORY_CARE_PROVIDER_SITE_OTHER): Payer: Commercial Managed Care - HMO

## 2014-06-06 DIAGNOSIS — Z23 Encounter for immunization: Secondary | ICD-10-CM

## 2014-06-24 ENCOUNTER — Other Ambulatory Visit: Payer: Self-pay

## 2014-06-24 MED ORDER — DILTIAZEM HCL ER 240 MG PO CP24: 240.0000 mg | ORAL_CAPSULE | Freq: Every day | ORAL | Status: DC

## 2014-07-14 ENCOUNTER — Encounter: Payer: Self-pay | Admitting: Internal Medicine

## 2014-08-30 ENCOUNTER — Ambulatory Visit (AMBULATORY_SURGERY_CENTER): Payer: Self-pay | Admitting: *Deleted

## 2014-08-30 VITALS — Ht 65.0 in | Wt 199.2 lb

## 2014-08-30 DIAGNOSIS — Z86010 Personal history of colonic polyps: Secondary | ICD-10-CM

## 2014-08-30 MED ORDER — MOVIPREP 100 G PO SOLR: ORAL | Status: DC

## 2014-08-30 NOTE — Progress Notes (Signed)
Upon arrival to previsit, pt states, "I want to know how many colons Dr. Henrene Pastor has perforated."  I explained that I was not the one who would have that information, but if he would feel more comfortable, he could have an OV to discuss this with Dr. Henrene Pastor before his procedure.  He continues to ask me how often people have to go over to the hospital after a colonoscopy- I stated, "I do not know the percentage of that, but it is very rare."  I had previously explained that he would speak with Dr. Henrene Pastor the day of his procedure before receiving sedation and he states, "I'll ask him then and I'll tell him to go slow."  No egg or soy allergy  No anesthesia or intubation problems per pt  No diet medications taken  Registered in [facility]

## 2014-09-08 ENCOUNTER — Encounter: Payer: Self-pay | Admitting: Internal Medicine

## 2014-09-08 ENCOUNTER — Ambulatory Visit (AMBULATORY_SURGERY_CENTER): Payer: Medicare Other | Admitting: Internal Medicine

## 2014-09-08 VITALS — BP 141/74 | HR 69 | Temp 97.3°F | Resp 27 | Ht 65.0 in | Wt 199.0 lb

## 2014-09-08 DIAGNOSIS — Z86010 Personal history of colonic polyps: Secondary | ICD-10-CM

## 2014-09-08 MED ORDER — SODIUM CHLORIDE 0.9 % IV SOLN: 500.0000 mL | INTRAVENOUS | Status: DC

## 2014-09-08 NOTE — Patient Instructions (Signed)
YOU HAD AN ENDOSCOPIC PROCEDURE TODAY AT [facility]: Refer to the procedure report that was given to you for any specific questions about what was found during the examination.  If the procedure report does not answer your questions, please call your gastroenterologist to clarify.  If you requested that your care partner not be given the details of your procedure findings, then the procedure report has been included in a sealed envelope for you to review at your convenience later.  YOU SHOULD EXPECT: Some feelings of bloating in the abdomen. Passage of more gas than usual.  Walking can help get rid of the air that was put into your GI tract during the procedure and reduce the bloating. If you had a lower endoscopy (such as a colonoscopy or flexible sigmoidoscopy) you may notice spotting of blood in your stool or on the toilet paper. If you underwent a bowel prep for your procedure, then you may not have a normal bowel movement for a few days.  DIET: Your first meal following the procedure should be a light meal and then it is ok to progress to your normal diet.  A half-sandwich or bowl of soup is an example of a good first meal.  Heavy or fried foods are harder to digest and may make you feel nauseous or bloated.  Likewise meals heavy in dairy and vegetables can cause extra gas to form and this can also increase the bloating.  Drink plenty of fluids but you should avoid alcoholic beverages for 24 hours.  ACTIVITY: Your care partner should take you home directly after the procedure.  You should plan to take it easy, moving slowly for the rest of the day.  You can resume normal activity the day after the procedure however you should NOT DRIVE or use heavy machinery for 24 hours (because of the sedation medicines used during the test).    SYMPTOMS TO REPORT IMMEDIATELY: A gastroenterologist can be reached at any hour.  During normal business hours, 8:30 AM to 5:00 PM Monday through Friday,  call [phone].  After hours and on weekends, please call the GI answering service at [phone] who will take a message and have the physician on call contact you.   Following lower endoscopy (colonoscopy or flexible sigmoidoscopy):  Excessive amounts of blood in the stool  Significant tenderness or worsening of abdominal pains  Swelling of the abdomen that is new, acute  Fever of 100F or higher  FOLLOW UP: Our staff will call the home number listed on your records the next business day following your procedure to check on you and address any questions or concerns that you may have at that time regarding the information given to you following your procedure. This is a courtesy call and so if there is no answer at the home number and we have not heard from you through the emergency physician on call, we will assume that you have returned to your regular daily activities without incident.  SIGNATURES/CONFIDENTIALITY: You and/or your care partner have signed paperwork which will be entered into your electronic medical record.  These signatures attest to the fact that that the information above on your After Visit Summary has been reviewed and is understood.  Full responsibility of the confidentiality of this discharge information lies with you and/or your care-partner.  Next colonoscopy- 10 years  Please read over handouts about diverticulosis and high fiber diets  Continue your normal medications

## 2014-09-08 NOTE — Progress Notes (Signed)
Pt awake and alert X3 Pleased with MAC.  VSS. Report to RN. DRM

## 2014-09-08 NOTE — Op Note (Signed)
[facility]  [address] [place], [address]   COLONOSCOPY PROCEDURE REPORT  PATIENT: Devin, Martin  MR#: [id number] BIRTHDATE: 02/13/1946 , 68  yrs. old GENDER: male ENDOSCOPIST: Eustace Quail, MD REFERRED AX:[id number] Program Recall PROCEDURE DATE:  09/08/2014 PROCEDURE:   Colonoscopy, surveillance First Screening Colonoscopy - Avg.  risk and is 50 yrs.  old or older - No.  Prior Negative Screening - Now for repeat screening. N/A  History of Adenoma - Now for follow-up colonoscopy & has been > or = to 3 yrs.  Yes hx of adenoma.  Has been 3 or more years since last colonoscopy.  Polyps Removed Today? No.  Polyps Removed Today? No.  Recommend repeat exam, <10 yrs? Polyps Removed Today? No.  Recommend repeat exam, <10 yrs? No. ASA CLASS:   Class II INDICATIONS:surveillance colonoscopy based on a history of adenomatous colonic polyp(s). Previous examinations 2002, 2005, and 2010 (small adenoma). MEDICATIONS: Monitored anesthesia care and Propofol 250 mg IV  DESCRIPTION OF PROCEDURE:   After the risks benefits and alternatives of the procedure were thoroughly explained, informed consent was obtained.  The digital rectal exam revealed no abnormalities of the rectum.   The LB [id number]  endoscope was introduced through the anus and advanced to the cecum, which was identified by both the appendix and ileocecal valve. No adverse events experienced.   The quality of the prep was excellent, using MoviPrep  The instrument was then slowly withdrawn as the colon was fully examined.    COLON FINDINGS: There was moderate diverticulosis noted in the left colon.   The examination was otherwise normal.  Retroflexed views revealed internal hemorrhoids. The time to cecum=2 minutes 26 seconds.  Withdrawal time=10 minutes 03 seconds.  The scope was withdrawn and the procedure completed. COMPLICATIONS: There were no immediate complications.  ENDOSCOPIC  IMPRESSION: 1.   Moderate diverticulosis was noted in the left colon 2.   The examination was otherwise normal  RECOMMENDATIONS: 1.Continue current colorectal screening recommendations for "routine risk" patients with a repeat colonoscopy in 10 years.  eSigned:  Eustace Quail, MD 09/08/2014 11:58 AM   cc: Kathlene November, MD and The Patient

## 2014-09-09 ENCOUNTER — Telehealth: Payer: Self-pay | Admitting: *Deleted

## 2014-09-09 NOTE — Telephone Encounter (Signed)
  Follow up Call-no answer, left message to call if questions or concerns.     

## 2014-09-21 ENCOUNTER — Other Ambulatory Visit: Payer: Self-pay | Admitting: Internal Medicine

## 2014-09-21 ENCOUNTER — Telehealth: Payer: Self-pay | Admitting: Medical

## 2014-09-21 NOTE — Telephone Encounter (Signed)
Caller name:Devin Martin  Relationship to patient: Self Can be [phone] [facility] [address]   Reason for call: PT was told by pharmacy to call regarding RX

## 2014-09-21 NOTE — Telephone Encounter (Signed)
Spoke with Dr. Larose Kells, per Dr. Larose Kells okay to send 3 month supply of Voltaren gel to pharmacy.

## 2014-09-21 NOTE — Telephone Encounter (Signed)
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   LOV 05/03/14 for physical with Mackie Pai.   Please advise.

## 2014-09-21 NOTE — Telephone Encounter (Signed)
Reviewed not from Dr. Larose Kells MA. I signed that today and Santiago Glad LPN should have sent that in today.

## 2014-11-10 ENCOUNTER — Ambulatory Visit: Payer: Medicare Other | Admitting: Medical

## 2014-11-10 ENCOUNTER — Telehealth: Payer: Self-pay | Admitting: Internal Medicine

## 2014-11-10 NOTE — Telephone Encounter (Signed)
error:[id number] ° °

## 2014-12-01 ENCOUNTER — Ambulatory Visit: Payer: Commercial Managed Care - HMO | Admitting: Pulmonary Disease

## 2015-02-20 ENCOUNTER — Other Ambulatory Visit: Payer: Self-pay

## 2015-03-21 ENCOUNTER — Telehealth: Payer: Self-pay | Admitting: *Deleted

## 2015-03-21 NOTE — Telephone Encounter (Signed)
Pt dropped off script that needs to be signed for [facility] for artificial leg supplies. Forwarded to Dr. Larose Kells, who will not be back in the office until 03/27/15. Will inform pt when it is ready. JG/CMA

## 2015-03-28 NOTE — Telephone Encounter (Signed)
No

## 2015-03-28 NOTE — Telephone Encounter (Signed)
Pt requesting to pick up paperwork that was dropped off last week today. Please advise.

## 2015-03-28 NOTE — Telephone Encounter (Signed)
Have you received form back from Dr. Larose Kells?

## 2015-03-28 NOTE — Telephone Encounter (Signed)
Rx signed, and copied for scanning into chart. Original given to Lamount Cohen for completion.

## 2015-03-28 NOTE — Telephone Encounter (Signed)
Please print rx

## 2015-03-28 NOTE — Telephone Encounter (Signed)
Called and lm for pt informing him that script is ready for pick up at our front desk. JG//CMA

## 2015-03-28 NOTE — Telephone Encounter (Signed)
Caller name: Garnie Borchardt Relationship to patient: self Can be reached: [phone]  Reason for call: Pt will be in the area today and wondering if he could pick up RX today. Please call him when ready or call to let him know if there are any issues signing/authorizing on RX.

## 2015-05-08 ENCOUNTER — Other Ambulatory Visit: Payer: Self-pay

## 2015-05-08 MED ORDER — ISOSORBIDE MONONITRATE ER 60 MG PO TB24: ORAL_TABLET | ORAL | Status: DC

## 2015-05-08 MED ORDER — POTASSIUM CITRATE ER 10 MEQ (1080 MG) PO TBCR: EXTENDED_RELEASE_TABLET | ORAL | Status: DC

## 2015-05-08 MED ORDER — OMEPRAZOLE 20 MG PO CPDR: DELAYED_RELEASE_CAPSULE | ORAL | Status: DC

## 2015-05-08 NOTE — Telephone Encounter (Signed)
Lelon Perla, MD at 05/04/2014 6:58 AM  diltiazem (DILACOR XR) 240 MG 24 hr capsuleTake 240 mg by mouth daily isosorbide mononitrate (IMDUR) 60 MG 24 hr tablet TAKE 1 TABLET BY MOUTH EVERY D rosuvastatin (CRESTOR) 40 MG tabletTake 1 tablet (40 mg total) by mouth daily potassium citrate (UROCIT-K) 10 MEQ (1080 MG) SR tabletTAKE 2 TABLETS BY MOUTH EVERY DAY omeprazole (PRILOSEC) 20 MG capsuleTAKE ONE CAPSULE BY MOUTH EVERY DAY  CORONARY ARTERY DISEASE - Lelon Perla, MD at 05/04/2014 12:56 PM     Status: Written Related Problem: CORONARY ARTERY DISEASE   Expand All Collapse All   Continue aspirin and statin       HYPERLIPIDEMIA - Lelon Perla, MD at 05/04/2014 12:56 PM     Status: Written Related Problem: HYPERLIPIDEMIA   Expand All Collapse All   Continue statin. Lipids and liver monitored by primary care       Medication Detail      Disp Refills Start End     diltiazem (DILACOR XR) 240 MG 24 hr capsule 90 capsule 3 06/24/2014     Sig - Route: Take 1 capsule (240 mg total) by mouth daily. - Oral    E-Prescribing Status: Receipt confirmed by pharmacy (06/24/2014 10:32 AM EDT)     Pharmacy    [id number] [place], [place]

## 2015-05-11 ENCOUNTER — Telehealth: Payer: Self-pay

## 2015-05-11 ENCOUNTER — Other Ambulatory Visit: Payer: Self-pay | Admitting: Internal Medicine

## 2015-05-11 NOTE — Telephone Encounter (Signed)
Hilda Blades called and said that she does need the computer generated RX returned to them. No OV notes needed since it was so long ago.

## 2015-05-11 NOTE — Telephone Encounter (Signed)
Received fax confirmation on 05/11/2015 at 1304.

## 2015-05-11 NOTE — Telephone Encounter (Signed)
LMOM at [facility] for [facility], informed her we did receive her fax for Pt's replacement prothesis. Informed her Pt came by on 05/10/2015 around 4 PM with prescription that Dr. Larose Kells signed and we gave back to Pt. Informed her I needed to know if she still needed paperwork for this and that Pt's last OV with Dr. Larose Kells was 12/2013, informed her I would be leaving the clinic after 1 or 2 PM.

## 2015-05-11 NOTE — Telephone Encounter (Signed)
Form stamped with Dr. Ethel Rana signature and faxed back to [facility] at [phone]. Form sent for scanning into Pt chart.
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
## 2015-06-09 ENCOUNTER — Ambulatory Visit (INDEPENDENT_AMBULATORY_CARE_PROVIDER_SITE_OTHER): Payer: Medicare Other | Admitting: Cardiology

## 2015-06-09 ENCOUNTER — Encounter: Payer: Self-pay | Admitting: Cardiology

## 2015-06-09 VITALS — BP 124/72 | HR 65 | Ht 65.0 in | Wt 196.0 lb

## 2015-06-09 DIAGNOSIS — I2510 Atherosclerotic heart disease of native coronary artery without angina pectoris: Secondary | ICD-10-CM

## 2015-06-09 DIAGNOSIS — I10 Essential (primary) hypertension: Secondary | ICD-10-CM

## 2015-06-09 MED ORDER — ISOSORBIDE MONONITRATE ER 60 MG PO TB24: ORAL_TABLET | ORAL | Status: DC

## 2015-06-09 MED ORDER — DILTIAZEM HCL ER 240 MG PO CP24: 240.0000 mg | ORAL_CAPSULE | Freq: Every day | ORAL | Status: DC

## 2015-06-09 MED ORDER — OMEPRAZOLE 20 MG PO CPDR: DELAYED_RELEASE_CAPSULE | ORAL | Status: DC

## 2015-06-09 MED ORDER — ROSUVASTATIN CALCIUM 40 MG PO TABS: 40.0000 mg | ORAL_TABLET | Freq: Every day | ORAL | Status: DC

## 2015-06-09 MED ORDER — POTASSIUM CITRATE ER 10 MEQ (1080 MG) PO TBCR: EXTENDED_RELEASE_TABLET | ORAL | Status: DC

## 2015-06-09 MED ORDER — NITROGLYCERIN 0.4 MG SL SUBL: SUBLINGUAL_TABLET | SUBLINGUAL | Status: DC

## 2015-06-09 NOTE — Assessment & Plan Note (Signed)
Continue aspirin and statin. 

## 2015-06-09 NOTE — Assessment & Plan Note (Signed)
Continue statin. Lipids and liver monitored by primary care. 

## 2015-06-09 NOTE — Patient Instructions (Signed)
Your physician wants you to follow-up in: ONE YEAR WITH DR CRENSHAW You will receive a reminder letter in the mail two months in advance. If you don't receive a letter, please call our office to schedule the follow-up appointment.  

## 2015-06-09 NOTE — Assessment & Plan Note (Signed)
Blood pressure controlled. Continue present medications. 

## 2015-07-15 ENCOUNTER — Other Ambulatory Visit: Payer: Self-pay | Admitting: Cardiology

## 2015-09-22 ENCOUNTER — Ambulatory Visit (INDEPENDENT_AMBULATORY_CARE_PROVIDER_SITE_OTHER): Payer: Medicare Other

## 2015-09-22 ENCOUNTER — Encounter: Payer: Self-pay | Admitting: Cardiology

## 2015-09-22 VITALS — BP 125/82 | HR 77 | Ht 65.0 in | Wt 200.0 lb

## 2015-09-22 DIAGNOSIS — Z0000 Encounter for general adult medical examination without abnormal findings: Secondary | ICD-10-CM

## 2015-09-22 DIAGNOSIS — K8020 Calculus of gallbladder without cholecystitis without obstruction: Secondary | ICD-10-CM

## 2015-09-22 DIAGNOSIS — Z1159 Encounter for screening for other viral diseases: Secondary | ICD-10-CM

## 2015-09-22 DIAGNOSIS — J42 Unspecified chronic bronchitis: Secondary | ICD-10-CM

## 2015-09-22 DIAGNOSIS — S88112D Complete traumatic amputation at level between knee and ankle, left lower leg, subsequent encounter: Secondary | ICD-10-CM

## 2015-09-22 DIAGNOSIS — Z23 Encounter for immunization: Secondary | ICD-10-CM | POA: Diagnosis not present

## 2015-09-22 NOTE — Assessment & Plan Note (Signed)
Currently not experiencing any symptoms.  Denies coughing, wheezing, and chest discomfort.  Hx of smoking, no longer a smoker.
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
## 2015-09-22 NOTE — Progress Notes (Signed)
Pre visit review using our clinic review tool, if applicable. No additional management support is needed unless otherwise documented below in the visit note. 

## 2015-09-22 NOTE — Patient Instructions (Addendum)
Continue to eat heart healthy diet (full of fruits, vegetables, whole grains, lean protein, water--limit salt, fat, and sugar intake) and increase physical activity as tolerated.  Continue doing brain stimulating activities (puzzles, reading, adult coloring books, staying active) to keep memory sharp.   Try Lose It app on phone to help with weight loss.   Follow up with Dr. Larose Kells as scheduled.    Hep C screening at next visit.    Health Maintenance, Male A healthy lifestyle and preventative care can promote health and wellness.  Maintain regular health, dental, and eye exams.  Eat a healthy diet. Foods like vegetables, fruits, whole grains, low-fat dairy products, and lean protein foods contain the nutrients you need and are low in calories. Decrease your intake of foods high in solid fats, added sugars, and salt. Get information about a proper diet from your health care provider, if necessary.  Regular physical exercise is one of the most important things you can do for your health. Most adults should get at least 150 minutes of moderate-intensity exercise (any activity that increases your heart rate and causes you to sweat) each week. In addition, most adults need muscle-strengthening exercises on 2 or more days a week.   Maintain a healthy weight. The body mass index (BMI) is a screening tool to identify possible weight problems. It provides an estimate of body fat based on height and weight. Your health care provider can find your BMI and can help you achieve or maintain a healthy weight. For males 20 years and older:  A BMI below 18.5 is considered underweight.  A BMI of 18.5 to 24.9 is normal.  A BMI of 25 to 29.9 is considered overweight.  A BMI of 30 and above is considered obese.  Maintain normal blood lipids and cholesterol by exercising and minimizing your intake of saturated fat. Eat a balanced diet with plenty of fruits and vegetables. Blood tests for lipids and cholesterol  should begin at age 102 and be repeated every 5 years. If your lipid or cholesterol levels are high, you are over age 96, or you are at high risk for heart disease, you may need your cholesterol levels checked more frequently.Ongoing high lipid and cholesterol levels should be treated with medicines if diet and exercise are not working.  If you smoke, find out from your health care provider how to quit. If you do not use tobacco, do not start.  Lung cancer screening is recommended for adults aged 16-80 years who are at high risk for developing lung cancer because of a history of smoking. A yearly low-dose CT scan of the lungs is recommended for people who have at least a 30-pack-year history of smoking and are current smokers or have quit within the past 15 years. A pack year of smoking is smoking an average of 1 pack of cigarettes a day for 1 year (for example, a 30-pack-year history of smoking could mean smoking 1 pack a day for 30 years or 2 packs a day for 15 years). Yearly screening should continue until the smoker has stopped smoking for at least 15 years. Yearly screening should be stopped for people who develop a health problem that would prevent them from having lung cancer treatment.  If you choose to drink alcohol, do not have more than 2 drinks per day. One drink is considered to be 12 oz (360 mL) of beer, 5 oz (150 mL) of wine, or 1.5 oz (45 mL) of liquor.  Avoid the  use of street drugs. Do not share needles with anyone. Ask for help if you need support or instructions about stopping the use of drugs.  High blood pressure causes heart disease and increases the risk of stroke. High blood pressure is more likely to develop in:  People who have blood pressure in the end of the normal range (100-139/85-89 mm Hg).  People who are overweight or obese.  People who are African American.  If you are 42-20 years of age, have your blood pressure checked every 3-5 years. If you are 76 years of age  or older, have your blood pressure checked every year. You should have your blood pressure measured twice--once when you are at a hospital or clinic, and once when you are not at a hospital or clinic. Record the average of the two measurements. To check your blood pressure when you are not at a hospital or clinic, you can use:  An automated blood pressure machine at a pharmacy.  A home blood pressure monitor.  If you are 39-56 years old, ask your health care provider if you should take aspirin to prevent heart disease.  Diabetes screening involves taking a blood sample to check your fasting blood sugar level. This should be done once every 3 years after age 42 if you are at a normal weight and without risk factors for diabetes. Testing should be considered at a younger age or be carried out more frequently if you are overweight and have at least 1 risk factor for diabetes.  Colorectal cancer can be detected and often prevented. Most routine colorectal cancer screening begins at the age of 29 and continues through age 24. However, your health care provider may recommend screening at an earlier age if you have risk factors for colon cancer. On a yearly basis, your health care provider may provide home test kits to check for hidden blood in the stool. A small camera at the end of a tube may be used to directly examine the colon (sigmoidoscopy or colonoscopy) to detect the earliest forms of colorectal cancer. Talk to your health care provider about this at age 61 when routine screening begins. A direct exam of the colon should be repeated every 5-10 years through age 61, unless early forms of precancerous polyps or small growths are found.  People who are at an increased risk for hepatitis B should be screened for this virus. You are considered at high risk for hepatitis B if:  You were born in a country where hepatitis B occurs often. Talk with your health care provider about which countries are  considered high risk.  Your parents were born in a high-risk country and you have not received a shot to protect against hepatitis B (hepatitis B vaccine).  You have HIV or AIDS.  You use needles to inject street drugs.  You live with, or have sex with, someone who has hepatitis B.  You are a man who has sex with other men (MSM).  You get hemodialysis treatment.  You take certain medicines for conditions like cancer, organ transplantation, and autoimmune conditions.  Hepatitis C blood testing is recommended for all people born from [birth year] through 1963 and any individual with known risk factors for hepatitis C.  Healthy men should no longer receive prostate-specific antigen (PSA) blood tests as part of routine cancer screening. Talk to your health care provider about prostate cancer screening.  Testicular cancer screening is not recommended for adolescents or adult males who have no  symptoms. Screening includes self-exam, a health care provider exam, and other screening tests. Consult with your health care provider about any symptoms you have or any concerns you have about testicular cancer.  Practice safe sex. Use condoms and avoid high-risk sexual practices to reduce the spread of sexually transmitted infections (STIs).  You should be screened for STIs, including gonorrhea and chlamydia if:  You are sexually active and are younger than 24 years.  You are older than 24 years, and your health care provider tells you that you are at risk for this type of infection.  Your sexual activity has changed since you were last screened, and you are at an increased risk for chlamydia or gonorrhea. Ask your health care provider if you are at risk.  If you are at risk of being infected with HIV, it is recommended that you take a prescription medicine daily to prevent HIV infection. This is called pre-exposure prophylaxis (PrEP). You are considered at risk if:  You are a man who has sex with other  men (MSM).  You are a heterosexual man who is sexually active with multiple partners.  You take drugs by injection.  You are sexually active with a partner who has HIV.  Talk with your health care provider about whether you are at high risk of being infected with HIV. If you choose to begin PrEP, you should first be tested for HIV. You should then be tested every 3 months for as long as you are taking PrEP.  Use sunscreen. Apply sunscreen liberally and repeatedly throughout the day. You should seek shade when your shadow is shorter than you. Protect yourself by wearing long sleeves, pants, a wide-brimmed hat, and sunglasses year round whenever you are outdoors.  Tell your health care provider of new moles or changes in moles, especially if there is a change in shape or color. Also, tell your health care provider if a mole is larger than the size of a pencil eraser.  A one-time screening for abdominal aortic aneurysm (AAA) and surgical repair of large AAAs by ultrasound is recommended for men aged 18-75 years who are current or former smokers.  Stay current with your vaccines (immunizations).   This information is not intended to replace advice given to you by your health care provider. Make sure you discuss any questions you have with your health care provider.   Document Released: 02/08/2008 Document Revised: 09/02/2014 Document Reviewed: 01/07/2011 Elsevier Interactive Patient Education [employer].

## 2015-09-29 ENCOUNTER — Ambulatory Visit: Payer: Medicare Other | Admitting: Medical

## 2015-10-19 ENCOUNTER — Ambulatory Visit (HOSPITAL_BASED_OUTPATIENT_CLINIC_OR_DEPARTMENT_OTHER)
Admission: RE | Admit: 2015-10-19 | Discharge: 2015-10-19 | Disposition: A | Discharge status: Home / Self Care | Payer: Medicare Other | Source: Ambulatory Visit | Attending: Medical | Admitting: Medical

## 2015-10-19 ENCOUNTER — Ambulatory Visit (INDEPENDENT_AMBULATORY_CARE_PROVIDER_SITE_OTHER): Payer: Medicare Other | Admitting: Medical

## 2015-10-19 ENCOUNTER — Encounter: Payer: Self-pay | Admitting: Cardiology

## 2015-10-19 ENCOUNTER — Other Ambulatory Visit: Payer: Self-pay

## 2015-10-19 ENCOUNTER — Encounter: Payer: Self-pay | Admitting: Medical

## 2015-10-19 VITALS — BP 110/70 | HR 78 | Temp 98.1°F | Ht 65.0 in | Wt 196.0 lb

## 2015-10-19 DIAGNOSIS — K8020 Calculus of gallbladder without cholecystitis without obstruction: Secondary | ICD-10-CM | POA: Insufficient documentation

## 2015-10-19 DIAGNOSIS — N200 Calculus of kidney: Secondary | ICD-10-CM | POA: Insufficient documentation

## 2015-10-19 DIAGNOSIS — R1013 Epigastric pain: Secondary | ICD-10-CM | POA: Insufficient documentation

## 2015-10-19 LAB — CBC WITH DIFFERENTIAL/PLATELET
BASOS PCT: 0.4 % (ref 0.0–3.0)
Basophils Absolute: 0 10*3/uL (ref 0.0–0.1)
EOS PCT: 1.6 % (ref 0.0–5.0)
Eosinophils Absolute: 0.1 10*3/uL (ref 0.0–0.7)
HCT: 47.4 % (ref 39.0–52.0)
HEMOGLOBIN: 15.7 g/dL (ref 13.0–17.0)
LYMPHS ABS: 2.1 10*3/uL (ref 0.7–4.0)
Lymphocytes Relative: 34.9 % (ref 12.0–46.0)
MCHC: 33.1 g/dL (ref 30.0–36.0)
MCV: 92.6 fl (ref 78.0–100.0)
MONO ABS: 0.5 10*3/uL (ref 0.1–1.0)
MONOS PCT: 8.7 % (ref 3.0–12.0)
Neutro Abs: 3.3 10*3/uL (ref 1.4–7.7)
Neutrophils Relative %: 54.4 % (ref 43.0–77.0)
Platelets: 222 10*3/uL (ref 150.0–400.0)
RBC: 5.12 Mil/uL (ref 4.22–5.81)
RDW: 13.1 % (ref 11.5–15.5)
WBC: 6.1 10*3/uL (ref 4.0–10.5)

## 2015-10-19 LAB — COMPREHENSIVE METABOLIC PANEL
ALT: 22 U/L (ref 0–53)
AST: 17 U/L (ref 0–37)
Albumin: 4.3 g/dL (ref 3.5–5.2)
Alkaline Phosphatase: 73 U/L (ref 39–117)
BUN: 18 mg/dL (ref 6–23)
CHLORIDE: 101 meq/L (ref 96–112)
CO2: 31 mEq/L (ref 19–32)
Calcium: 9.3 mg/dL (ref 8.4–10.5)
Creatinine, Ser: 0.92 mg/dL (ref 0.40–1.50)
GFR: 86.98 mL/min (ref >60.00)
GLUCOSE: 111 mg/dL — AB (ref 70–99)
POTASSIUM: 4.1 meq/L (ref 3.5–5.1)
SODIUM: 138 meq/L (ref 135–145)
Total Bilirubin: 1.1 mg/dL (ref 0.2–1.2)
Total Protein: 7.3 g/dL (ref 6.0–8.3)

## 2015-10-19 LAB — LIPASE: Lipase: 22 U/L (ref 11.0–59.0)

## 2015-10-19 LAB — AMYLASE: AMYLASE: 26 U/L — AB (ref 27–131)

## 2015-10-19 NOTE — Telephone Encounter (Signed)
This encounter was created in error - please disregard.

## 2015-10-19 NOTE — Progress Notes (Signed)
Pre visit review using our clinic review tool, if applicable. No additional management support is needed unless otherwise documented below in the visit note. 

## 2015-10-19 NOTE — Telephone Encounter (Signed)
Please call,says he need to talk to you about dealing with certain things.

## 2015-10-19 NOTE — Patient Instructions (Signed)
Please get labs done today in our office.  At 2:30 scheduled for abdominal US to be done. Don't eat or drink any until the test is done. Then wait downstairs until I get report on [place]. Then will discuss results.  Will advise further treatment/plan after studies back.  Follow up date to be determined as well.
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
## 2015-10-19 NOTE — Telephone Encounter (Signed)
Pt notified of lab results and [place] results. Referring to general surgeon.

## 2015-10-19 NOTE — Telephone Encounter (Signed)
Please see referral.

## 2015-10-24 ENCOUNTER — Telehealth: Payer: Self-pay | Admitting: Internal Medicine

## 2015-10-24 NOTE — Telephone Encounter (Signed)
Edward please advise on note below.  

## 2015-10-24 NOTE — Telephone Encounter (Signed)
Caller name: Self  Can be reached: [phone]   Reason for call: Patient was dx with Food Posion and would like to know what type of Food Posion he has.

## 2015-10-24 NOTE — Telephone Encounter (Signed)
Let pt now that various bacteria may have played a role in his illness. Difficult to say which one. After reviewing his ultrasound(gallstones) and knowing he has some reflux. Difficult to say it was exclusively food poisoning.

## 2015-10-24 NOTE — Telephone Encounter (Signed)
Pt saw Percell Miller on 10/19/2015, forwarding to [place] and [place].

## 2015-10-25 ENCOUNTER — Telehealth: Payer: Self-pay | Admitting: Medical

## 2015-10-25 NOTE — Telephone Encounter (Signed)
Pt is returning your call.   CB:

## 2015-10-25 NOTE — Telephone Encounter (Signed)
I did discuss with patient his possibility of bacteria. But his culture from urgent care was negative. So he will attend surgeon appointment to discuss his gallbladder.

## 2015-10-25 NOTE — Telephone Encounter (Signed)
Left message for pt to call back about note below.

## 2015-10-25 NOTE — Telephone Encounter (Signed)
Spoke with pt and he voices understanding.  

## 2015-11-17 ENCOUNTER — Telehealth: Payer: Self-pay | Admitting: Internal Medicine

## 2015-11-17 NOTE — Telephone Encounter (Signed)
Spoke w/ Pt, informed him that Dr. Larose Kells has signed form and I have faxed to [facility]. Pt verbalized understanding. Form sent for scanning.

## 2015-11-17 NOTE — Telephone Encounter (Signed)
Opened in error

## 2015-11-17 NOTE — Telephone Encounter (Signed)
Form faxed to [facility] at [phone]. Form sent for scanning to chart. Received fax confirmation on 11/17/2015 1103.

## 2015-11-17 NOTE — Telephone Encounter (Signed)
done

## 2015-11-17 NOTE — Telephone Encounter (Signed)
Caller name: Atharva Relation to pt: self Call back number: [phone] Pharmacy:  Reason for call:  Pt came in office and filled out walk-in sheet with information needing to be signed and sent to [facility]. Pt states needs ASAP (Today) and would like to be called went prescription signed and faxed to [facility]. Please advise.

## 2015-11-17 NOTE — Telephone Encounter (Signed)
Received fax from [facility], needing signature, prognosis, and medical necessity for S[employer] x2. Placed in MDs red folder for review and completion.

## 2015-11-17 NOTE — Telephone Encounter (Signed)
Faxed back to [facility] at [phone].

## 2015-11-17 NOTE — Telephone Encounter (Signed)
Received paperwork, completed to the best of my ability and placed in provider's red folder.

## 2016-02-06 ENCOUNTER — Ambulatory Visit (INDEPENDENT_AMBULATORY_CARE_PROVIDER_SITE_OTHER): Payer: Medicare Other | Admitting: Internal Medicine

## 2016-02-06 ENCOUNTER — Encounter: Payer: Self-pay | Admitting: Internal Medicine

## 2016-02-06 VITALS — BP 116/76 | HR 54 | Temp 98.1°F | Ht 65.0 in | Wt 194.5 lb

## 2016-02-06 DIAGNOSIS — R739 Hyperglycemia, unspecified: Secondary | ICD-10-CM | POA: Diagnosis not present

## 2016-02-06 DIAGNOSIS — Z125 Encounter for screening for malignant neoplasm of prostate: Secondary | ICD-10-CM

## 2016-02-06 DIAGNOSIS — I2510 Atherosclerotic heart disease of native coronary artery without angina pectoris: Secondary | ICD-10-CM

## 2016-02-06 DIAGNOSIS — I10 Essential (primary) hypertension: Secondary | ICD-10-CM | POA: Diagnosis not present

## 2016-02-06 DIAGNOSIS — Z0001 Encounter for general adult medical examination with abnormal findings: Secondary | ICD-10-CM | POA: Diagnosis not present

## 2016-02-06 DIAGNOSIS — K219 Gastro-esophageal reflux disease without esophagitis: Secondary | ICD-10-CM | POA: Diagnosis not present

## 2016-02-06 DIAGNOSIS — Z23 Encounter for immunization: Secondary | ICD-10-CM | POA: Diagnosis not present

## 2016-02-06 DIAGNOSIS — Z0000 Encounter for general adult medical examination without abnormal findings: Secondary | ICD-10-CM | POA: Diagnosis not present

## 2016-02-06 DIAGNOSIS — Z9081 Acquired absence of spleen: Secondary | ICD-10-CM

## 2016-02-06 LAB — LIPID PANEL
CHOL/HDL RATIO: 2
Cholesterol: 110 mg/dL (ref 0–200)
HDL: 51.4 mg/dL (ref >39.00)
LDL CALC: 44 mg/dL (ref 0–99)
NONHDL: 58.35
Triglycerides: 73 mg/dL (ref 0.0–149.0)
VLDL: 14.6 mg/dL (ref 0.0–40.0)

## 2016-02-06 LAB — PSA: PSA: 0.9 ng/mL (ref 0.10–4.00)

## 2016-02-06 LAB — HEMOGLOBIN A1C: HEMOGLOBIN A1C: 5.8 % (ref 4.6–6.5)

## 2016-02-06 MED ORDER — OMEPRAZOLE 20 MG PO CPDR: 20.0000 mg | DELAYED_RELEASE_CAPSULE | Freq: Every day | ORAL | Status: DC

## 2016-02-06 NOTE — Progress Notes (Signed)
Pre visit review using our clinic review tool, if applicable. No additional management support is needed unless otherwise documented below in the visit note. 

## 2016-02-06 NOTE — Patient Instructions (Signed)
Get your blood work before you leave      Think about having a healthcare power of attorney  Next visit in 6 months, no fasting  Fall Prevention and [place] cause injuries and can affect all age groups. It is possible to use preventive measures to significantly decrease the likelihood of falls. There are many simple measures which can make your home safer and prevent falls. OUTDOORS  Repair cracks and edges of walkways and driveways.  Remove high doorway thresholds.  Trim shrubbery on the main path into your home.  Have good outside lighting.  Clear walkways of tools, rocks, debris, and clutter.  Check that handrails are not broken and are securely fastened. Both sides of steps should have handrails.  Have leaves, snow, and ice cleared regularly.  Use sand or salt on walkways during winter months.  In the garage, clean up grease or oil spills. BATHROOM  Install night lights.  Install grab bars by the toilet and in the tub and shower.  Use non-skid mats or decals in the tub or shower.  Place a plastic non-slip stool in the shower to sit on, if needed.  Keep floors dry and clean up all water on the floor immediately.  Remove soap buildup in the tub or shower on a regular basis.  Secure bath mats with non-slip, double-sided rug tape.  Remove throw rugs and tripping hazards from the floors. BEDROOMS  Install night lights.  Make sure a bedside light is easy to reach.  Do not use oversized bedding.  Keep a telephone by your bedside.  Have a firm chair with side arms to use for getting dressed.  Remove throw rugs and tripping hazards from the floor. KITCHEN  Keep handles on pots and pans turned toward the center of the stove. Use back burners when possible.  Clean up spills quickly and allow time for drying.  Avoid walking on wet floors.  Avoid hot utensils and knives.  Position shelves so they are not too high or low.  Place commonly used  objects within easy reach.  If necessary, use a sturdy step stool with a grab bar when reaching.  Keep electrical cables out of the way.  Do not use floor polish or wax that makes floors slippery. If you must use wax, use non-skid floor wax.  Remove throw rugs and tripping hazards from the floor. STAIRWAYS  Never leave objects on stairs.  Place handrails on both sides of stairways and use them. Fix any loose handrails. Make sure handrails on both sides of the stairways are as long as the stairs.  Check carpeting to make sure it is firmly attached along stairs. Make repairs to worn or loose carpet promptly.  Avoid placing throw rugs at the top or bottom of stairways, or properly secure the rug with carpet tape to prevent slippage. Get rid of throw rugs, if possible.  Have an electrician put in a light switch at the top and bottom of the stairs. OTHER FALL PREVENTION TIPS  Wear low-heel or rubber-soled shoes that are supportive and fit well. Wear closed toe shoes.  When using a stepladder, make sure it is fully opened and both spreaders are firmly locked. Do not climb a closed stepladder.  Add color or contrast paint or tape to grab bars and handrails in your home. Place contrasting color strips on first and last steps.  Learn and use mobility aids as needed. Install an electrical emergency response system.  Turn on lights  to avoid dark areas. Replace light bulbs that burn out immediately. Get light switches that glow.  Arrange furniture to create clear pathways. Keep furniture in the same place.  Firmly attach carpet with non-skid or double-sided tape.  Eliminate uneven floor surfaces.  Select a carpet pattern that does not visually hide the edge of steps.  Be aware of all pets. OTHER HOME SAFETY TIPS  Set the water temperature for 120 F (48.8 C).  Keep emergency numbers on or near the telephone.  Keep smoke detectors on every level of the home and near sleeping  areas. Document Released: 08/02/2002 Document Revised: 02/11/2012 Document Reviewed: 11/01/2011 [facility] Patient Information 2015 [place], [place]. This information is not intended to replace advice given to you by your health care provider. Make sure you discuss any questions you have with your health care provider.   Preventive Care for Adults Ages 48 and over  Blood pressure check.** / Every 1 to 2 years.  Lipid and cholesterol check.**/ Every 5 years beginning at age 33.  Lung cancer screening. / Every year if you are aged 36-80 years and have a 30-pack-year history of smoking and currently smoke or have quit within the past 15 years. Yearly screening is stopped once you have quit smoking for at least 15 years or develop a health problem that would prevent you from having lung cancer treatment.  Fecal occult blood test (FOBT) of stool. / Every year beginning at age 67 and continuing until age 37. You may not have to do this test if you get a colonoscopy every 10 years.  Flexible sigmoidoscopy** or colonoscopy.** / Every 5 years for a flexible sigmoidoscopy or every 10 years for a colonoscopy beginning at age 19 and continuing until age 32.  Hepatitis C blood test.** / For all people born from [birth year] through 1963 and any individual with known risks for hepatitis C.  Abdominal aortic aneurysm (AAA) screening.** / A one-time screening for ages 20 to 28 years who are current or former smokers.  Skin self-exam. / Monthly.  Influenza vaccine. / Every year.  Tetanus, diphtheria, and acellular pertussis (Tdap/Td) vaccine.** / 1 dose of Td every 10 years.  Varicella vaccine.** / Consult your health care provider.  Zoster vaccine.** / 1 dose for adults aged 40 years or older.  Pneumococcal 13-valent conjugate (PCV13) vaccine.** / Consult your health care provider.  Pneumococcal polysaccharide (PPSV23) vaccine.** / 1 dose for all adults aged 53 years and older.  Meningococcal vaccine.** /  Consult your health care provider.  Hepatitis A vaccine.** / Consult your health care provider.  Hepatitis B vaccine.** / Consult your health care provider.  Haemophilus influenzae type b (Hib) vaccine.** / Consult your health care provider. **Family history and personal history of risk and conditions may change your health care provider's recommendations. Document Released: 10/08/2001 Document Revised: 08/17/2013 Document Reviewed: 01/07/2011 [facility] Patient Information 2015 [place], [place]. This information is not intended to replace advice given to you by your health care provider. Make sure you discuss any questions you have with your health care provider.
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
## 2016-02-06 NOTE — Assessment & Plan Note (Addendum)
Td 2008; ;zostavax today Recommend flu shot this season   colonoscopy 12/10, colonoscopy again 2016, next per GI Prostate  cancer screening: DRE (-) today, check a PSA Counseling regards diet, exercise

## 2016-02-07 DIAGNOSIS — Z09 Encounter for follow-up examination after completed treatment for conditions other than malignant neoplasm: Secondary | ICD-10-CM | POA: Insufficient documentation

## 2016-02-07 NOTE — Assessment & Plan Note (Signed)
HTN: Continue dialtiazem, recent BMP satisfactory High cholesterol: On Crestor, recent LFTs normal, check FLP. GERD: Refill PPIs CAD: Continue aspirin, Crestor, isosorbide. He is asx Splenectomy: Menactra booster today, consider [place] the next time Cholelithiasis: had abd pain few months ago, [place] + GB stones but pt is convinced sx were not GB related, declined referral to surgery but will call if has any stomach pain. RTC 6 months

## 2016-02-21 ENCOUNTER — Emergency Department (HOSPITAL_COMMUNITY): Payer: Medicare Other

## 2016-02-21 ENCOUNTER — Telehealth: Payer: Self-pay | Admitting: Internal Medicine

## 2016-02-21 ENCOUNTER — Encounter (HOSPITAL_COMMUNITY): Payer: Self-pay | Admitting: Emergency Medicine

## 2016-02-21 ENCOUNTER — Emergency Department (HOSPITAL_COMMUNITY)
Admission: EM | Admit: 2016-02-21 | Discharge: 2016-02-21 | Disposition: A | Discharge status: Home / Self Care | Payer: Medicare Other | Attending: Emergency Medicine | Admitting: Emergency Medicine

## 2016-02-21 DIAGNOSIS — Z87891 Personal history of nicotine dependence: Secondary | ICD-10-CM | POA: Diagnosis not present

## 2016-02-21 DIAGNOSIS — Z7982 Long term (current) use of aspirin: Secondary | ICD-10-CM | POA: Insufficient documentation

## 2016-02-21 DIAGNOSIS — Z79899 Other long term (current) drug therapy: Secondary | ICD-10-CM | POA: Diagnosis not present

## 2016-02-21 DIAGNOSIS — R079 Chest pain, unspecified: Secondary | ICD-10-CM | POA: Insufficient documentation

## 2016-02-21 DIAGNOSIS — I10 Essential (primary) hypertension: Secondary | ICD-10-CM | POA: Insufficient documentation

## 2016-02-21 DIAGNOSIS — I2510 Atherosclerotic heart disease of native coronary artery without angina pectoris: Secondary | ICD-10-CM | POA: Insufficient documentation

## 2016-02-21 DIAGNOSIS — I252 Old myocardial infarction: Secondary | ICD-10-CM | POA: Diagnosis not present

## 2016-02-21 DIAGNOSIS — R1013 Epigastric pain: Secondary | ICD-10-CM | POA: Insufficient documentation

## 2016-02-21 DIAGNOSIS — E785 Hyperlipidemia, unspecified: Secondary | ICD-10-CM | POA: Insufficient documentation

## 2016-02-21 LAB — BASIC METABOLIC PANEL
ANION GAP: 8 (ref 5–15)
BUN: 10 mg/dL (ref 6–20)
CHLORIDE: 105 mmol/L (ref 101–111)
CO2: 28 mmol/L (ref 22–32)
Calcium: 9.5 mg/dL (ref 8.9–10.3)
Creatinine, Ser: 0.75 mg/dL (ref 0.61–1.24)
GFR calc Af Amer: >60 mL/min (ref >60)
GFR calc non Af Amer: >60 mL/min (ref >60)
GLUCOSE: 102 mg/dL — AB (ref 65–99)
POTASSIUM: 4.1 mmol/L (ref 3.5–5.1)
Sodium: 141 mmol/L (ref 135–145)

## 2016-02-21 LAB — HEPATIC FUNCTION PANEL
ALT: 25 U/L (ref 17–63)
AST: 22 U/L (ref 15–41)
Albumin: 4.2 g/dL (ref 3.5–5.0)
Alkaline Phosphatase: 73 U/L (ref 38–126)
Bilirubin, Direct: 0.3 mg/dL (ref 0.1–0.5)
Indirect Bilirubin: 1.1 mg/dL — ABNORMAL HIGH (ref 0.3–0.9)
Total Bilirubin: 1.4 mg/dL — ABNORMAL HIGH (ref 0.3–1.2)
Total Protein: 7 g/dL (ref 6.5–8.1)

## 2016-02-21 LAB — CBC
HEMATOCRIT: 47.3 % (ref 39.0–52.0)
HEMOGLOBIN: 16 g/dL (ref 13.0–17.0)
MCH: 30.7 pg (ref 26.0–34.0)
MCHC: 33.8 g/dL (ref 30.0–36.0)
MCV: 90.6 fL (ref 78.0–100.0)
Platelets: 241 10*3/uL (ref 150–400)
RBC: 5.22 MIL/uL (ref 4.22–5.81)
RDW: 12.3 % (ref 11.5–15.5)
WBC: 9.7 10*3/uL (ref 4.0–10.5)

## 2016-02-21 LAB — LIPASE, BLOOD: Lipase: 48 U/L (ref 11–51)

## 2016-02-21 LAB — I-STAT TROPONIN, ED: Troponin i, poc: 0 ng/mL (ref 0.00–0.08)

## 2016-02-21 MED ORDER — GI COCKTAIL ~~LOC~~
30.0000 mL | Freq: Once | ORAL | Status: AC
  Administered 2016-02-21: 30 mL via ORAL
  Filled 2016-02-21: qty 30

## 2016-02-21 NOTE — ED Notes (Signed)
Pt states last night after eating ice cream he started having some upper abd pain and feeling bloated. This morning pt felt the same so took some magnesium citrate. Today pt started also having some pressure in his chest and sharp pain in his back. Pt also reports mild nausea with no vomiting. Pt is warm and dry. No distress.
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
## 2016-02-21 NOTE — Telephone Encounter (Signed)
Patient called complaining of severe stomach pain, since last night. No appointments available, he wanted to speak to nurse.Transfered to [facility].

## 2016-02-21 NOTE — ED Notes (Signed)
Pt left without signing or or updating his vital signs. This RN informed the pt that she would be with him shortly after administering a medication for another pt. Pt states "What happens if I walk out." This RN informed the pt that he would not be able to receive his medications nor valuable discharge instructions. Pt stated "that's fine I don't need no prescriptions." Pt the proceeded to throw up his hands and walk out.

## 2016-02-21 NOTE — Telephone Encounter (Signed)
[facility] - Client  TELEPHONE ADVICE RECORD   [facility]    --------------------------------------------------------------------------------   Patient Name: Devin Martin  Gender: Male  DOB: 09-14-1945   Age: 70 Y 3 M 6 D  Return Phone Number: [phone] (Primary), [phone] (Secondary), [phone] (Alternate)  Address:     City/State/Zip: [place]  [address]   Client [facility] - Client  Client Site [facility] - Day  Physician Kathlene November - MD  Contact Type Call  Who Is Calling Patient / Member / Family / Caregiver  Call Type Triage / Clinical  Relationship To Patient Self  Return Phone Number [phone] (Primary)  Chief Complaint CHEST PAIN (>=21 years) - pain, pressure, heaviness or tightness  Reason for Call Symptomatic / Request for Health Information  Initial Comment Caller is Jenn with [place] and pt having pain in upper part of stomach, chest, and back that started after eating ice cream last night. Had food poisoning few months ago that affected galbladder. Tried magnesium citrate at 12:30 and nothing has happened. Feels bloated   Appointment Disposition EMR Appointment Not Necessary  Info pasted into Epic Yes  PreDisposition Go to ED  Translation No       Nurse Assessment  Nurse: Venetia Maxon, RN, Manuela Schwartz Date/Time (Eastern Time): 02/21/2016 2:56:18 PM  Confirm and document reason for call. If symptomatic, describe symptoms. You must click the next button to save text entered. ---Caller is [place] with [place] and pt having pain in upper part of stomach, chest, and back that started after eating ice cream last night. Had food poisoning few months ago that affected galbladder. Tried magnesium citrate at 12:30 and nothing has happened. Feels bloated . He bought mag citrate and took one this am nothing happened. Last BM yesterday. No vomiting . nauseated. Last urine was 10 min  ago . He had a heart attack 18yrs ago and 8 yrs ago . He did not take NTG.    Has the patient traveled out of the country within the last 30 days? ---No    Does the patient have any new or worsening symptoms? ---Yes    Will a triage be completed? ---Yes    Related visit to physician within the last 2 weeks? ---No    Does the PT have any chronic conditions? (i.e. diabetes, asthma, etc.) ---Yes    List chronic conditions. ---2 MI's takes Baby ASA statin omeprazole Crestar Potassium citrate    Is this a behavioral health or substance abuse call? ---No           Guidelines          Guideline Title Affirmed Question Affirmed Notes Nurse Date/Time Eilene Ghazi Time)  Chest Pain SEVERE chest pain    Venetia Maxon, RN, Manuela Schwartz 02/21/2016 3:00:23 PM    Disp. Time Eilene Ghazi Time) Disposition Final User    02/21/2016 2:53:28 PM Send to Urgent [place] [place]      02/21/2016 3:02:05 PM Go to ED Now Yes Venetia Maxon, RN, Edwena Bunde Understands: Yes  Disagree/Comply: Comply       Care Advice Given Per Guideline        GO TO ED NOW: You need to be seen in the Emergency Department. Go to the ER at ___________ [place] now. Drive carefully. NOTE TO TRIAGER - DRIVING: * Another adult should drive. CALL  EMS IF: * You become worse. NOTHING BY MOUTH: Do not eat or drink anything for now. * Please bring a list of your current medicines when you go to the Emergency Department (ER). BRING MEDICINES: CARE ADVICE given per Chest Pain (Adult) guideline.        --------------------------------------------------------------------------------         Comments  User: Willey Blade, RN Date/Time Eilene Ghazi Time): 02/21/2016 3:05:58 PM  addendum: advised pt to take one SL NTG and he did . He states he is 15 min from hospital and wife can drive him there now.    Referrals  [facility] - ED

## 2016-02-22 NOTE — Telephone Encounter (Signed)
Thank you for checking on him. 

## 2016-02-22 NOTE — Telephone Encounter (Signed)
Pt seen in ED last night w/ Epigastric pain. Elevated LFTs noted, Pt left due to long wait times. Would you like for him to follow-up?

## 2016-02-22 NOTE — Telephone Encounter (Signed)
Only today bilirubin was elevated.  Call the patient, see if he needs or desires to follow-up

## 2016-02-22 NOTE — Telephone Encounter (Signed)
Called pt, he reports he is feeling fine today and his pain is completely resolved. He does not desire follow-up in office at this time. Spent >7 minutes on the phone with patient and >5 minutes was spent listening to pt relay his frustration about the "terrible" experience he had in the ED due to the long wait time and triage/acuity system that was used to decide how quickly pts are seen. He also voiced frustration about not being able to get a same-day office visit w/ Dr. Larose Kells. Explained to pt that we do offer same-day appointments, but that they may not always be w/ PCP, and that he called in the afternoon (around 2:30) at which point all of our same day appointments are often filled.

## 2016-03-05 ENCOUNTER — Telehealth: Payer: Self-pay | Admitting: Internal Medicine

## 2016-03-05 NOTE — Telephone Encounter (Signed)
Form received, completed, and forwarded to PCP for signature.

## 2016-03-05 NOTE — Telephone Encounter (Signed)
Signed form received, and faxed to [facility] at [phone]. Form sent for scanning.

## 2016-03-05 NOTE — Telephone Encounter (Signed)
Caller name: Wilkie Relation to [phone] Call back number:[phone] Pharmacy:  Reason for call: Pt came in office stating needs prescription for replacement of Prosthetic footshell and skin for Transtibial prosthesis. (Pt left at front desk rx for the replacement). Pt needs rx to be faxed to [phone] [facility]). Please advise.

## 2016-03-08 NOTE — Telephone Encounter (Signed)
Letter of medical necessity of below replacement received from [facility]. Forwarded to Dr. Larose Kells. JG//CMA

## 2016-03-12 NOTE — Telephone Encounter (Signed)
Form completed and faxed to [facility] at [phone]. Form sent for scanning.

## 2016-03-21 ENCOUNTER — Other Ambulatory Visit: Payer: Self-pay | Admitting: *Deleted

## 2016-03-21 MED ORDER — OMEPRAZOLE 20 MG PO CPDR: 20.0000 mg | DELAYED_RELEASE_CAPSULE | Freq: Every day | ORAL | 0 refills | Status: DC

## 2016-05-06 ENCOUNTER — Encounter: Payer: Self-pay | Admitting: Internal Medicine

## 2016-05-06 ENCOUNTER — Ambulatory Visit (INDEPENDENT_AMBULATORY_CARE_PROVIDER_SITE_OTHER): Payer: Medicare Other | Admitting: Internal Medicine

## 2016-05-06 VITALS — BP 118/66 | HR 60 | Temp 98.2°F | Resp 14 | Ht 65.0 in | Wt 199.4 lb

## 2016-05-06 DIAGNOSIS — Z89519 Acquired absence of unspecified leg below knee: Secondary | ICD-10-CM | POA: Insufficient documentation

## 2016-05-06 DIAGNOSIS — Z89512 Acquired absence of left leg below knee: Secondary | ICD-10-CM | POA: Diagnosis not present

## 2016-05-06 DIAGNOSIS — E669 Obesity, unspecified: Secondary | ICD-10-CM

## 2016-05-06 NOTE — Progress Notes (Signed)
Pre visit review using our clinic review tool, if applicable. No additional management support is needed unless otherwise documented below in the visit note. 
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
## 2016-05-06 NOTE — Assessment & Plan Note (Signed)
Status post left BKA: rx for  01-2016 prosthetic locking liners provided  Obesity: We discussed his options, at this point recommend to start counting calories to objectively assess his diet, then decrease his calorie intake if needed. Also consider medications, see instructions. Hernia? No obvious hernia on exam, rec observation, will call if symptoms increase or if he has a bulging area. Had epigastric pain, went to the ER, workup negative, sx resolve. He has a history of cholelithiasis, previously declined the referral. Primary care: Strongly recommend flu shot, benefits discussed, declined. RTC 07-2016

## 2016-05-06 NOTE — Patient Instructions (Signed)
For weight loss: Recommend calorie counting, MYFITNESSPAL is a great app Also consider medicines such as : Qsymia, Belviq or Saxenda Please do your own  research and see if you like to proceed. Also, call your insurance to see if the meds are covered.  Next visit should be by December for a routine checkup.

## 2016-05-17 ENCOUNTER — Telehealth: Payer: Self-pay

## 2016-05-17 NOTE — Telephone Encounter (Signed)
Received medical necessity forms from [facility] for Socket Insert W/ Lock Mesh. Form completed and faxed w/ OV note from 05/06/2016 back to [facility]. Form sent for scanning.
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
## 2016-06-03 ENCOUNTER — Encounter: Payer: Self-pay | Admitting: Cardiology

## 2016-06-03 ENCOUNTER — Ambulatory Visit (INDEPENDENT_AMBULATORY_CARE_PROVIDER_SITE_OTHER): Payer: Medicare Other | Admitting: Cardiology

## 2016-06-03 VITALS — BP 120/60 | HR 57 | Ht 65.0 in | Wt 196.4 lb

## 2016-06-03 DIAGNOSIS — I2510 Atherosclerotic heart disease of native coronary artery without angina pectoris: Secondary | ICD-10-CM | POA: Diagnosis not present

## 2016-06-03 DIAGNOSIS — I10 Essential (primary) hypertension: Secondary | ICD-10-CM

## 2016-06-03 DIAGNOSIS — R072 Precordial pain: Secondary | ICD-10-CM

## 2016-06-03 MED ORDER — ISOSORBIDE MONONITRATE ER 60 MG PO TB24: 90.0000 mg | ORAL_TABLET | Freq: Every day | ORAL | 3 refills | Status: DC

## 2016-06-03 NOTE — Patient Instructions (Signed)
Medication Instructions:   INCREASE ISOSORBIDE TO 90 MG ONCE DAILY= 1 AND 1/2 OF THE 60 MG TABLET ONCE DAILY  Testing/Procedures:  Your physician has requested that you have en exercise stress myoview. For further information please visit [email or web address]. Please follow instruction sheet, as given.    Follow-Up:  Your physician wants you to follow-up in: [place] will receive a reminder letter in the mail two months in advance. If you don't receive a letter, please call our office to schedule the follow-up appointment.   If you need a refill on your cardiac medications before your next appointment, please call your pharmacy.

## 2016-06-05 ENCOUNTER — Telehealth (HOSPITAL_COMMUNITY): Payer: Self-pay

## 2016-06-05 NOTE — Telephone Encounter (Signed)
Encounter complete. 

## 2016-06-07 ENCOUNTER — Ambulatory Visit (HOSPITAL_COMMUNITY)
Admission: RE | Admit: 2016-06-07 | Discharge: 2016-06-07 | Disposition: A | Discharge status: Home / Self Care | Payer: Medicare Other | Source: Ambulatory Visit | Attending: Cardiology | Admitting: Cardiology

## 2016-06-07 DIAGNOSIS — I252 Old myocardial infarction: Secondary | ICD-10-CM | POA: Diagnosis not present

## 2016-06-07 DIAGNOSIS — R072 Precordial pain: Secondary | ICD-10-CM

## 2016-06-07 LAB — MYOCARDIAL PERFUSION IMAGING
CHL CUP NUCLEAR SSS: 14
CSEPPHR: 76 {beats}/min
LV sys vol: 54 mL
LVDIAVOL: 113 mL (ref 62–150)
NUC STRESS TID: 1.09
Rest HR: 56 {beats}/min
SDS: 4
SRS: 10

## 2016-06-07 MED ORDER — TECHNETIUM TC 99M TETROFOSMIN IV KIT
31.8000 | PACK | Freq: Once | INTRAVENOUS | Status: AC | PRN
  Administered 2016-06-07: 31.8 via INTRAVENOUS
  Filled 2016-06-07: qty 32

## 2016-06-07 MED ORDER — TECHNETIUM TC 99M TETROFOSMIN IV KIT
10.6000 | PACK | Freq: Once | INTRAVENOUS | Status: AC | PRN
Start: 2016-06-07 — End: 2016-06-07
  Administered 2016-06-07: 10.6 via INTRAVENOUS
  Filled 2016-06-07: qty 11

## 2016-06-07 MED ORDER — AMINOPHYLLINE 25 MG/ML IV SOLN
75.0000 mg | Freq: Once | INTRAVENOUS | Status: AC
  Administered 2016-06-07: 75 mg via INTRAVENOUS

## 2016-06-07 MED ORDER — REGADENOSON 0.4 MG/5ML IV SOLN
0.4000 mg | Freq: Once | INTRAVENOUS | Status: AC
  Administered 2016-06-07: 0.4 mg via INTRAVENOUS

## 2016-06-14 ENCOUNTER — Other Ambulatory Visit: Payer: Self-pay | Admitting: Cardiology

## 2016-06-14 NOTE — Telephone Encounter (Signed)
Rx(s) sent to pharmacy electronically.  

## 2016-06-15 ENCOUNTER — Other Ambulatory Visit: Payer: Self-pay | Admitting: Cardiology

## 2016-06-17 ENCOUNTER — Other Ambulatory Visit: Payer: Self-pay | Admitting: *Deleted

## 2016-06-17 MED ORDER — ROSUVASTATIN CALCIUM 40 MG PO TABS: 40.0000 mg | ORAL_TABLET | Freq: Every day | ORAL | 3 refills | Status: DC

## 2016-08-07 ENCOUNTER — Ambulatory Visit (INDEPENDENT_AMBULATORY_CARE_PROVIDER_SITE_OTHER): Payer: Medicare Other | Admitting: Internal Medicine

## 2016-08-07 ENCOUNTER — Encounter: Payer: Self-pay | Admitting: Internal Medicine

## 2016-08-07 VITALS — BP 118/74 | HR 66 | Temp 97.6°F | Resp 14 | Ht 65.0 in | Wt 202.2 lb

## 2016-08-07 DIAGNOSIS — I10 Essential (primary) hypertension: Secondary | ICD-10-CM | POA: Diagnosis not present

## 2016-08-07 DIAGNOSIS — E785 Hyperlipidemia, unspecified: Secondary | ICD-10-CM | POA: Diagnosis not present

## 2016-08-07 DIAGNOSIS — R079 Chest pain, unspecified: Secondary | ICD-10-CM | POA: Diagnosis not present

## 2016-08-07 DIAGNOSIS — Z23 Encounter for immunization: Secondary | ICD-10-CM | POA: Diagnosis not present

## 2016-08-07 NOTE — Progress Notes (Signed)
Pre visit review using our clinic review tool, if applicable. No additional management support is needed unless otherwise documented below in the visit note. 
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
## 2016-08-07 NOTE — Patient Instructions (Signed)
Please come back for a physical exam by June 2018, call sooner if needed

## 2016-08-08 NOTE — Assessment & Plan Note (Signed)
HTN: BP normal, on diltiazem. Hyperlipidemia: Controlled on Crestor CAD: Saw cardiology, chart reviewed, had chest tightness, Imdur increased, stress test was low risk, currently asx. Obesity: Doing better with lifestyle, unable to lose weight, pt will not consider medication. Declined a nutritionist referral. Primary care: Flu shot today. RTC June 2013, CPX

## 2016-09-24 ENCOUNTER — Ambulatory Visit (INDEPENDENT_AMBULATORY_CARE_PROVIDER_SITE_OTHER): Payer: PPO | Admitting: *Deleted

## 2016-09-24 ENCOUNTER — Encounter: Payer: Self-pay | Admitting: *Deleted

## 2016-09-24 ENCOUNTER — Ambulatory Visit: Payer: Medicare Other | Admitting: *Deleted

## 2016-09-24 VITALS — BP 130/76 | HR 71 | Resp 18 | Ht 65.0 in | Wt 196.4 lb

## 2016-09-24 DIAGNOSIS — G4733 Obstructive sleep apnea (adult) (pediatric): Secondary | ICD-10-CM

## 2016-09-24 DIAGNOSIS — Z0000 Encounter for general adult medical examination without abnormal findings: Secondary | ICD-10-CM

## 2016-09-24 NOTE — Progress Notes (Signed)
Pre visit review using our clinic review tool, if applicable. No additional management support is needed unless otherwise documented below in the visit note. 
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
## 2016-09-24 NOTE — Assessment & Plan Note (Signed)
Wearing CPAP every night. No issues reported. Previously followed by Dr. Gwenette Greet, who has since retired. Pt declines referral to pulm for follow-up at this time.

## 2016-09-24 NOTE — Patient Instructions (Addendum)
Devin Martin , Thank you for taking time to come for your Medicare Wellness Visit. I appreciate your ongoing commitment to your health goals. Please review the following plan we discussed and let me know if I can assist you in the future.   Bring a copy of your advance directives to your next office visit.  Consider keeping a log of your food intake (time you eat, what you eat, amount, and calories) and your physical activity.  These are the goals we discussed: Goals    . Weight (lb) < 190 lb (86.2 kg)       This is a list of the screening recommended for you and due dates:  Health Maintenance  Topic Date Due  .  Hepatitis C: One time screening is recommended by Center for Disease Control  (CDC) for  adults born from [birth year] through 1963.   02/04/2017*  . Tetanus Vaccine  03/24/2017*  . Colon Cancer Screening  09/08/2024  . Flu Shot  Completed  . Shingles Vaccine  Completed  . Pneumonia vaccines  Completed  *Topic was postponed. The date shown is not the original due date.

## 2016-09-25 ENCOUNTER — Telehealth: Payer: Self-pay | Admitting: Pulmonary Disease

## 2016-09-25 ENCOUNTER — Telehealth: Payer: Self-pay | Admitting: Internal Medicine

## 2016-09-25 DIAGNOSIS — Z9989 Dependence on other enabling machines and devices: Principal | ICD-10-CM

## 2016-09-25 DIAGNOSIS — G4733 Obstructive sleep apnea (adult) (pediatric): Secondary | ICD-10-CM

## 2016-09-25 NOTE — Telephone Encounter (Signed)
Spoke with pt. And informed him that since it has been over a year and the last provider he saw was [facility] that an office visit is required and then he can discuss with the provider switching dme, and establishing with a new sleep provider. An appointment was made. He is aware to bring his cpap machine with the cord if he is unable to get SD card

## 2016-09-25 NOTE — Telephone Encounter (Signed)
Patient has informed pulmonologist as well.

## 2016-09-25 NOTE — Telephone Encounter (Signed)
Patient has changed health insurance and is now on [employer]. He is also changing the company he gets his CPAP through to [place]. He would like to get an order sent over to them for his CPAP. Please advise  Huey Romans Fax: [phone]  Patient phone: [phone]

## 2016-09-25 NOTE — Telephone Encounter (Signed)
CPAP supplies and OSA managed by pulmonary. Last OV 2015. Will place referral.

## 2016-09-30 ENCOUNTER — Ambulatory Visit: Payer: PPO | Admitting: Adult Health

## 2016-09-30 ENCOUNTER — Ambulatory Visit (INDEPENDENT_AMBULATORY_CARE_PROVIDER_SITE_OTHER): Payer: PPO | Admitting: Acute Care

## 2016-09-30 ENCOUNTER — Encounter: Payer: Self-pay | Admitting: Acute Care

## 2016-09-30 VITALS — BP 112/80 | HR 60 | Ht 65.5 in | Wt 195.0 lb

## 2016-09-30 DIAGNOSIS — G4733 Obstructive sleep apnea (adult) (pediatric): Secondary | ICD-10-CM | POA: Diagnosis not present

## 2016-09-30 NOTE — Progress Notes (Signed)
History of Present Illness Devin Martin is a 71 y.o. male with OSA on CPAP treatment. He was last seen in the office 11/30/2013 by Dr. Gwenette Greet.    09/30/2016 Follow Up CPAP: Pt. Presents to the office today for CPAP follow up. He was last seen in the office 11/30/2013. He is compliant with his CPAP use.He uses his machine every night for 7-8 hours each night.He wears a [employer] 10 machine. He is on AutoSet 11-20 cm of H2O. Average pressure is 11.8. AHI is 1.7.He has minimal leak with his mask. He awakens refreshed and has no day time sleepiness. He is requesting a new MDE company. He is requesting [employer].We will send in the order  at his request.Pt. Denies fever,chest pain, orthopnea, or hemoptysis.He is benefiting from his CPAP treatment.  Tests Down Load:Usage days: 30/30 ( 100%) > 4 hours ( 100%) Average Use 7 hours 54 minutes Autoset 11-20 cm H2O Average pressure 11.8 cm H2O Minimal Leaks AHI>> 1.7  Past medical hx Past Medical History:  Diagnosis Date  . Arthritis 1968   pt wears a lt bk prosthesis  . Bronchitis, chronic ([place])   . CAD (coronary artery disease)    Non-ST segment elevation myocardial infarction in 2005 with drug-eluting stent to the left anterior descending. 08-2009 - non-ST segment elevation myocardial infarction, felt secondary to vasospasm.  . Calculus, kidney    hx  . Complication of anesthesia    itching after preop med  ?name - 2013, no SOB  . Glaucoma   . Hyperlipidemia   . Hypertension   . Myocardial infarction 2005   x2, 2005, 2010  . OSA (obstructive sleep apnea)    AHI 68/hr in 2002-uses a cpap  . Pneumonia    hx of   . Sleep apnea    wears CPAP  . Wears glasses      Past surgical hx, Family hx, Social hx all reviewed.  Current Outpatient Prescriptions on File Prior to Visit  Medication Sig  . Aspirin (ADULT ASPIRIN LOW STRENGTH) 81 MG EC tablet Take 81 mg by mouth every evening.   . diltiazem (DILACOR XR) 240 MG 24 hr capsule Take  240 mg by mouth every evening.   . Glucosamine HCl (GLUCOSAMINE PO) Take 1 tablet by mouth every evening.   . isosorbide mononitrate (IMDUR) 60 MG 24 hr tablet Take 1.5 tablets (90 mg total) by mouth daily. (Patient taking differently: Take 60 mg by mouth daily. )  . magnesium citrate SOLN Take 0.5 Bottles by mouth once.  . nitroGLYCERIN (NITROSTAT) 0.4 MG SL tablet PLACE 1 TABLET (0.4 MG TOTAL) UNDER THE TONGUE EVERY 5 (FIVE) MINUTES AS NEEDED FOR CHEST PAIN.  Marland Kitchen omeprazole (PRILOSEC) 20 MG capsule Take 1 capsule (20 mg total) by mouth daily.  . potassium citrate (UROCIT-K) 10 MEQ (1080 MG) SR tablet TAKE 2 TABLETS BY MOUTH EVERY DAY  . rosuvastatin (CRESTOR) 40 MG tablet Take 1 tablet (40 mg total) by mouth daily.   No current facility-administered medications on file prior to visit.      Allergies  Allergen Reactions  . Penicillins Hives and Other (See Comments)    syncope  . Tylox [Oxycodone-Acetaminophen] Other (See Comments)    "messes me up"  . Codeine Nausea Only    Review Of Systems:  Constitutional:   No  weight loss, night sweats,  Fevers, chills, fatigue, or  lassitude.  HEENT:   No headaches,  Difficulty swallowing,  Tooth/dental problems,  or  Sore throat,                No sneezing, itching, ear ache, nasal congestion, post nasal drip,   CV:  No chest pain,  Orthopnea, PND, swelling in lower extremities, anasarca, dizziness, palpitations, syncope.   GI  No heartburn, indigestion, abdominal pain, nausea, vomiting, diarrhea, change in bowel habits, loss of appetite, bloody stools.   Resp: No shortness of breath with exertion or at rest.  No excess mucus, no productive cough,  No non-productive cough,  No coughing up of blood.  No change in color of mucus.  No wheezing.  No chest wall deformity  Skin: no rash or lesions.  GU: no dysuria, change in color of urine, no urgency or frequency.  No flank pain, no hematuria   MS:  No joint pain or swelling.  No decreased range  of motion.  No back pain.  Psych:  No change in mood or affect. No depression or anxiety.  No memory loss.   Vital Signs BP 112/80 (BP Location: Left Arm, Cuff Size: Normal)   Pulse 60   Ht 5' 5.5" (1.664 m)   Wt 195 lb (88.5 kg)   SpO2 95%   BMI 31.96 kg/m    Physical Exam:  General- No distress,  A&Ox3, obese ENT: No sinus tenderness, TM clear, pale nasal mucosa, no oral exudate,no post nasal drip, no LAN Cardiac: S1, S2, regular rate and rhythm, no murmur Chest: No wheeze/ rales/ dullness; no accessory muscle use, no nasal flaring, no sternal retractions Abd.: Soft Non-tender Ext: No clubbing cyanosis, edema Neuro:  normal strength Skin: No rashes, warm and dry Psych: normal mood and behavior   Assessment/Plan  Obstructive sleep apnea OSA diagnosed in 2002 AHI 68/hr. Compliant with CPAP 30 of 30 nights Average use 7 hours 54 minutes Plan: We will request a new DME with [employer] at your request. We will provide them with your machine settings. Continue on CPAP at bedtime. You appear to be benefiting from the treatment Goal is to wear for at least 6 hours each night for maximal clinical benefit. Continue to work on weight loss, as the link between excess weight  and sleep apnea is well established.  Do not drive if sleepy. Wash mask, tubing, reservoir with soap and water every week. Follow up with Dr. Corrie Dandy  In 12 months or before as needed.  Please contact office for sooner follow up if symptoms do not improve or worsen or seek emergency care      Magdalen Spatz, NP 09/30/2016  1:10 PM

## 2016-09-30 NOTE — Patient Instructions (Addendum)
We will request a new DME with [employer] at your request. We will provide them with your machine settings. Continue on CPAP at bedtime. You appear to be benefiting from the treatment Goal is to wear for at least 6 hours each night for maximal clinical benefit. Continue to work on weight loss, as the link between excess weight  and sleep apnea is well established.  Do not drive if sleepy. Wash mask, tubing, reservoir with soap and water every week. Follow up with Dr. Corrie Dandy  In 12 months or before as needed.  Please contact office for sooner follow up if symptoms do not improve or worsen or seek emergency care

## 2016-09-30 NOTE — Assessment & Plan Note (Signed)
OSA diagnosed in 2002 AHI 68/hr. Compliant with CPAP 30 of 30 nights Average use 7 hours 54 minutes Plan: We will request a new DME with [employer] at your request. We will provide them with your machine settings. Continue on CPAP at bedtime. You appear to be benefiting from the treatment Goal is to wear for at least 6 hours each night for maximal clinical benefit. Continue to work on weight loss, as the link between excess weight  and sleep apnea is well established.  Do not drive if sleepy. Wash mask, tubing, reservoir with soap and water every week. Follow up with Dr. Corrie Dandy  In 12 months or before as needed.  Please contact office for sooner follow up if symptoms do not improve or worsen or seek emergency care

## 2016-10-02 ENCOUNTER — Telehealth: Payer: Self-pay | Admitting: Acute Care

## 2016-10-02 NOTE — Telephone Encounter (Signed)
Attempted to contact pt. No answer, no option to leave a message. Will try back.  

## 2016-10-03 NOTE — Telephone Encounter (Signed)
Spoke with pt, wants to check status of order for cpap mask.  This was sent to [facility].  Pt reached to [place], who told pt that we have never sent an order to [facility] for this patient.  I advised pt that we sent this order on 09/30/16 and confirmation of order was received. Called [facility] to follow up on status of order, states that they need a copy of sleep study, detailed info regarding what cpap he has, if he owns or is renting cpap all faxed to [place] with the supplies order.  PCC's please advise if this information was sent with his order.  Thanks!

## 2016-10-03 NOTE — Telephone Encounter (Signed)
Looks like order was placed and sent and received by [facility] on 2/5.  I have attempted to call [facility] to find out what is going on with this order, but they are not in the office yet.  Huey Romans will need to be called back later today.

## 2016-10-03 NOTE — Telephone Encounter (Signed)
Patient called checking on cpap order. Advised order was sent to [place] and will check on it and give him a call back. He can be reached at [phone]. -pr

## 2016-10-04 ENCOUNTER — Other Ambulatory Visit: Payer: Self-pay | Admitting: Cardiology

## 2016-10-04 NOTE — Telephone Encounter (Signed)
Spoke with [email or web address] [facility] they did receive this order and records she will fax a form here for Eric Form to sign for his mask and supplies Joellen [place]

## 2016-10-08 ENCOUNTER — Telehealth: Payer: Self-pay | Admitting: Adult Health

## 2016-10-08 NOTE — Telephone Encounter (Signed)
Orders for pt's CPAP supplies was placed on 10/03/16. I called and left a message with [facility] to check the status of this. Called and spoke with the pt and let him know that we are checking on this for him. Will await [employer]'s call.

## 2016-10-08 NOTE — Telephone Encounter (Signed)
Spoke with [place] - she will check with [facility] to ensure we have all the necessary forms sent to them in order to get patient's supplies. She will let me know after she contact [facility] and I will contact patient with follow up info-pr

## 2016-10-08 NOTE — Telephone Encounter (Signed)
Libby spoke with [facility]ue[facility] Romans and they will contact patient - sending message to Devin Martin to follow up -pr

## 2016-10-11 DIAGNOSIS — G4733 Obstructive sleep apnea (adult) (pediatric): Secondary | ICD-10-CM | POA: Diagnosis not present

## 2016-10-30 ENCOUNTER — Telehealth: Payer: Self-pay | Admitting: Internal Medicine

## 2016-10-30 ENCOUNTER — Emergency Department (HOSPITAL_BASED_OUTPATIENT_CLINIC_OR_DEPARTMENT_OTHER): Payer: PPO

## 2016-10-30 ENCOUNTER — Emergency Department (HOSPITAL_BASED_OUTPATIENT_CLINIC_OR_DEPARTMENT_OTHER)
Admission: EM | Admit: 2016-10-30 | Discharge: 2016-10-30 | Disposition: A | Discharge status: Home / Self Care | Payer: PPO | Attending: Physician Assistant | Admitting: Physician Assistant

## 2016-10-30 DIAGNOSIS — R109 Unspecified abdominal pain: Secondary | ICD-10-CM

## 2016-10-30 DIAGNOSIS — K5900 Constipation, unspecified: Secondary | ICD-10-CM | POA: Insufficient documentation

## 2016-10-30 DIAGNOSIS — Z79899 Other long term (current) drug therapy: Secondary | ICD-10-CM | POA: Insufficient documentation

## 2016-10-30 DIAGNOSIS — R1084 Generalized abdominal pain: Secondary | ICD-10-CM | POA: Insufficient documentation

## 2016-10-30 DIAGNOSIS — I2510 Atherosclerotic heart disease of native coronary artery without angina pectoris: Secondary | ICD-10-CM | POA: Diagnosis not present

## 2016-10-30 DIAGNOSIS — Z7982 Long term (current) use of aspirin: Secondary | ICD-10-CM | POA: Insufficient documentation

## 2016-10-30 DIAGNOSIS — I10 Essential (primary) hypertension: Secondary | ICD-10-CM | POA: Insufficient documentation

## 2016-10-30 DIAGNOSIS — R1110 Vomiting, unspecified: Secondary | ICD-10-CM | POA: Diagnosis not present

## 2016-10-30 DIAGNOSIS — Z87891 Personal history of nicotine dependence: Secondary | ICD-10-CM | POA: Diagnosis not present

## 2016-10-30 DIAGNOSIS — R112 Nausea with vomiting, unspecified: Secondary | ICD-10-CM | POA: Insufficient documentation

## 2016-10-30 LAB — I-STAT CG4 LACTIC ACID, ED: LACTIC ACID, VENOUS: 1.41 mmol/L (ref 0.5–1.9)

## 2016-10-30 LAB — CBC WITH DIFFERENTIAL/PLATELET
BASOS ABS: 0 10*3/uL (ref 0.0–0.1)
BASOS PCT: 0 %
EOS ABS: 0 10*3/uL (ref 0.0–0.7)
EOS PCT: 1 %
HCT: 45.8 % (ref 39.0–52.0)
Hemoglobin: 16.1 g/dL (ref 13.0–17.0)
Lymphocytes Relative: 22 %
Lymphs Abs: 1.8 10*3/uL (ref 0.7–4.0)
MCH: 31.4 pg (ref 26.0–34.0)
MCHC: 35.2 g/dL (ref 30.0–36.0)
MCV: 89.3 fL (ref 78.0–100.0)
MONO ABS: 0.6 10*3/uL (ref 0.1–1.0)
Monocytes Relative: 7 %
Neutro Abs: 5.8 10*3/uL (ref 1.7–7.7)
Neutrophils Relative %: 70 %
PLATELETS: 222 10*3/uL (ref 150–400)
RBC: 5.13 MIL/uL (ref 4.22–5.81)
RDW: 12.3 % (ref 11.5–15.5)
WBC: 8.2 10*3/uL (ref 4.0–10.5)

## 2016-10-30 LAB — COMPREHENSIVE METABOLIC PANEL
ALBUMIN: 4.5 g/dL (ref 3.5–5.0)
ALK PHOS: 71 U/L (ref 38–126)
ALT: 26 U/L (ref 17–63)
AST: 23 U/L (ref 15–41)
Anion gap: 8 (ref 5–15)
BILIRUBIN TOTAL: 0.9 mg/dL (ref 0.3–1.2)
BUN: 16 mg/dL (ref 6–20)
CALCIUM: 9.1 mg/dL (ref 8.9–10.3)
CO2: 24 mmol/L (ref 22–32)
Chloride: 106 mmol/L (ref 101–111)
Creatinine, Ser: 0.78 mg/dL (ref 0.61–1.24)
GFR calc Af Amer: >60 mL/min (ref >60)
GFR calc non Af Amer: >60 mL/min (ref >60)
GLUCOSE: 105 mg/dL — AB (ref 65–99)
POTASSIUM: 3.7 mmol/L (ref 3.5–5.1)
Sodium: 138 mmol/L (ref 135–145)
TOTAL PROTEIN: 7.2 g/dL (ref 6.5–8.1)

## 2016-10-30 LAB — LIPASE, BLOOD: LIPASE: 28 U/L (ref 11–51)

## 2016-10-30 LAB — TROPONIN I

## 2016-10-30 LAB — OCCULT BLOOD X 1 CARD TO LAB, STOOL: FECAL OCCULT BLD: NEGATIVE

## 2016-10-30 MED ORDER — ONDANSETRON HCL 4 MG/2ML IJ SOLN
4.0000 mg | Freq: Once | INTRAMUSCULAR | Status: AC
  Administered 2016-10-30: 4 mg via INTRAVENOUS
  Filled 2016-10-30: qty 2

## 2016-10-30 MED ORDER — OMEPRAZOLE 20 MG PO CPDR: 20.0000 mg | DELAYED_RELEASE_CAPSULE | Freq: Two times a day (BID) | ORAL | 0 refills | Status: DC

## 2016-10-30 MED ORDER — IOPAMIDOL (ISOVUE-300) INJECTION 61%
100.0000 mL | Freq: Once | INTRAVENOUS | Status: AC | PRN
  Administered 2016-10-30: 100 mL via INTRAVENOUS

## 2016-10-30 MED ORDER — SUCRALFATE 1 GM/10ML PO SUSP: 1.0000 g | Freq: Three times a day (TID) | ORAL | 0 refills | Status: DC

## 2016-10-30 MED ORDER — FENTANYL CITRATE (PF) 100 MCG/2ML IJ SOLN
50.0000 ug | Freq: Once | INTRAMUSCULAR | Status: AC
  Administered 2016-10-30: 50 ug via INTRAVENOUS
  Filled 2016-10-30: qty 2

## 2016-10-30 MED ORDER — GI COCKTAIL ~~LOC~~
30.0000 mL | Freq: Once | ORAL | Status: AC
  Administered 2016-10-30: 30 mL via ORAL
  Filled 2016-10-30: qty 30

## 2016-10-30 NOTE — ED Notes (Signed)
DC instructions reviewed with pt along with the two (2) Rx written by the ED attending. Encouraged pt to have rx filled and take as recommended and written by EDP. Also encouraged pt contact his attending MD French Ana, MD) to assist him with obtaining a GI consult as recommended by EDP as well.

## 2016-10-30 NOTE — Telephone Encounter (Signed)
Follow up call made to patient. States he is on his way to ED regarding his stomach pain.

## 2016-10-30 NOTE — Telephone Encounter (Signed)
Patient called stating he thinks he is having a severe gallbladder attack. Transferred to [facility].

## 2016-10-30 NOTE — ED Notes (Signed)
Patient transported to CT 

## 2016-10-30 NOTE — Telephone Encounter (Signed)
[facility]  Patient Name: Devin Martin  DOB: Oct 03, 1945    Initial Comment Caller states he has had issues with his gallbladder. He is having black bile in bowel movements. He is having severe abdominal pain.    Nurse Assessment  Nurse: Harlow Mares, RN, Suanne Marker Date/Time (Eastern Time): 10/30/2016 2:26:13 PM  Confirm and document reason for call. If symptomatic, describe symptoms. ---Caller states he has had issues with his gallbladder. He is having black bile in bowel movements. He is having severe abdominal pain.  Does the patient have any new or worsening symptoms? ---Yes  Will a triage be completed? ---Yes  Related visit to physician within the last 2 weeks? ---No  Does the PT have any chronic conditions? (i.e. diabetes, asthma, etc.) ---Unknown  Is this a behavioral health or substance abuse call? ---No     Guidelines    Guideline Title Affirmed Question Affirmed Notes  Abdominal Pain - Upper Black or tarry bowel movements (Exception: chronic-unchanged black-grey bowel movements AND is taking iron pills or Pepto-bismol)    Final Disposition User   Go to ED Now Harlow Mares, RN, Rhonda    Referrals  GO TO FACILITY UNDECIDED   Disagree/Comply: Leta Baptist

## 2016-10-30 NOTE — ED Notes (Signed)
Pt. On monitor. 

## 2016-10-30 NOTE — ED Notes (Signed)
Denies chest pain or sob

## 2016-10-30 NOTE — ED Notes (Signed)
Pt made NPO, explained to pt to not eat or drink until further notice

## 2016-10-30 NOTE — ED Notes (Signed)
ED Provider at bedside. 

## 2016-10-30 NOTE — ED Notes (Signed)
Having abdominal pain, onset three days ago. Points to area of epigastric pain, but radiates to back. States having nausea, no vomiting, having a lot of black stools. Has poor appetite. States did eat breakfast this am.

## 2016-10-30 NOTE — Discharge Instructions (Signed)
You were seen today with abdominal pain. We were unable to find out anything that required acute intervention at this time. Please increase your omeprazole to help with the acid in your stomach and use Carafate help with her symptoms. Please follow-up with GI if youwould like further assistance in looking into your abdominal pain.
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
## 2016-10-30 NOTE — ED Notes (Signed)
12 lead ECG done, to EDP for review

## 2016-10-30 NOTE — ED Notes (Signed)
Pt states he has increased abd pain, and that nausea has also returned. Spoke with EDP

## 2016-10-30 NOTE — ED Triage Notes (Signed)
Pt states 2-3 days ago started having abd pain witrh n/v and having tarry stool

## 2016-10-30 NOTE — ED Notes (Signed)
Order rec from [employer]

## 2016-11-01 ENCOUNTER — Ambulatory Visit (INDEPENDENT_AMBULATORY_CARE_PROVIDER_SITE_OTHER): Payer: PPO | Admitting: Gastroenterology

## 2016-11-01 ENCOUNTER — Encounter: Payer: Self-pay | Admitting: Gastroenterology

## 2016-11-01 VITALS — BP 116/70 | HR 60 | Ht 64.0 in | Wt 193.4 lb

## 2016-11-01 DIAGNOSIS — R1084 Generalized abdominal pain: Secondary | ICD-10-CM

## 2016-11-01 DIAGNOSIS — R1013 Epigastric pain: Secondary | ICD-10-CM

## 2016-11-01 MED ORDER — OMEPRAZOLE 40 MG PO CPDR: 40.0000 mg | DELAYED_RELEASE_CAPSULE | Freq: Two times a day (BID) | ORAL | 2 refills | Status: DC

## 2016-11-01 NOTE — Progress Notes (Signed)
I suspect that his epigastric pain, despite PPI, is secondary to symptomatic cholelithiasis (noted on CT scan). I agree with EGD to rule out other causes. If negative, refer to general surgery for consideration of laparoscopic cholecystectomy.
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
## 2016-11-01 NOTE — Patient Instructions (Signed)
You have been scheduled for an endoscopy. Please follow written instructions given to you at your visit today. If you use inhalers (even only as needed), please bring them with you on the day of your procedure. Your physician has requested that you go to www.startemmi.com and enter the access code given to you at your visit today. This web site gives a general overview about your procedure. However, you should still follow specific instructions given to you by our office regarding your preparation for the procedure.  We have sent the following medications to your pharmacy for you to pick up at your convenience: Omeprazole 40 mg twice a day 

## 2016-11-07 ENCOUNTER — Ambulatory Visit: Payer: PPO | Admitting: Gastroenterology

## 2016-11-12 ENCOUNTER — Encounter: Payer: Self-pay | Admitting: Internal Medicine

## 2016-11-25 ENCOUNTER — Encounter: Payer: Self-pay | Admitting: Internal Medicine

## 2016-11-25 ENCOUNTER — Ambulatory Visit (AMBULATORY_SURGERY_CENTER): Payer: PPO | Admitting: Internal Medicine

## 2016-11-25 VITALS — BP 118/64 | HR 67 | Temp 98.6°F | Resp 12 | Ht 64.0 in | Wt 193.0 lb

## 2016-11-25 DIAGNOSIS — R1013 Epigastric pain: Secondary | ICD-10-CM

## 2016-11-25 DIAGNOSIS — R933 Abnormal findings on diagnostic imaging of other parts of digestive tract: Secondary | ICD-10-CM | POA: Diagnosis not present

## 2016-11-25 DIAGNOSIS — R109 Unspecified abdominal pain: Secondary | ICD-10-CM | POA: Diagnosis not present

## 2016-11-25 DIAGNOSIS — I2510 Atherosclerotic heart disease of native coronary artery without angina pectoris: Secondary | ICD-10-CM | POA: Diagnosis not present

## 2016-11-25 DIAGNOSIS — K219 Gastro-esophageal reflux disease without esophagitis: Secondary | ICD-10-CM | POA: Diagnosis not present

## 2016-11-25 MED ORDER — SODIUM CHLORIDE 0.9 % IV SOLN: 500.0000 mL | INTRAVENOUS | Status: DC

## 2016-11-25 NOTE — Patient Instructions (Signed)
Discharge instructions given. Normal exam. Resume previous medications. YOU HAD AN ENDOSCOPIC PROCEDURE TODAY AT THE [facility]:   Refer to the procedure report that was given to you for any specific questions about what was found during the examination.  If the procedure report does not answer your questions, please call your gastroenterologist to clarify.  If you requested that your care partner not be given the details of your procedure findings, then the procedure report has been included in a sealed envelope for you to review at your convenience later.  YOU SHOULD EXPECT: Some feelings of bloating in the abdomen. Passage of more gas than usual.  Walking can help get rid of the air that was put into your GI tract during the procedure and reduce the bloating. If you had a lower endoscopy (such as a colonoscopy or flexible sigmoidoscopy) you may notice spotting of blood in your stool or on the toilet paper. If you underwent a bowel prep for your procedure, you may not have a normal bowel movement for a few days.  Please Note:  You might notice some irritation and congestion in your nose or some drainage.  This is from the oxygen used during your procedure.  There is no need for concern and it should clear up in a day or so.  SYMPTOMS TO REPORT IMMEDIATELY:   Following upper endoscopy (EGD)  Vomiting of blood or coffee ground material  New chest pain or pain under the shoulder blades  Painful or persistently difficult swallowing  New shortness of breath  Fever of 100F or higher  Black, tarry-looking stools  For urgent or emergent issues, a gastroenterologist can be reached at any hour by calling [phone].   DIET:  We do recommend a small meal at first, but then you may proceed to your regular diet.  Drink plenty of fluids but you should avoid alcoholic beverages for 24 hours.  ACTIVITY:  You should plan to take it easy for the rest of today and you should NOT DRIVE or  use heavy machinery until tomorrow (because of the sedation medicines used during the test).    FOLLOW UP: Our staff will call the number listed on your records the next business day following your procedure to check on you and address any questions or concerns that you may have regarding the information given to you following your procedure. If we do not reach you, we will leave a message.  However, if you are feeling well and you are not experiencing any problems, there is no need to return our call.  We will assume that you have returned to your regular daily activities without incident.  If any biopsies were taken you will be contacted by phone or by letter within the next 1-3 weeks.  Please call us at [phone] if you have not heard about the biopsies in 3 weeks.    SIGNATURES/CONFIDENTIALITY: You and/or your care partner have signed paperwork which will be entered into your electronic medical record.  These signatures attest to the fact that that the information above on your After Visit Summary has been reviewed and is understood.  Full responsibility of the confidentiality of this discharge information lies with you and/or your care-partner. 

## 2016-11-25 NOTE — Op Note (Signed)
[facility] Patient Name: Devin Martin Procedure Date: 11/25/2016 1:47 PM MRN: [id number] Endoscopist: Docia Chuck. Henrene Pastor , MD Age: 71 Referring MD:  Date of Birth: 06/28/1946 Gender: Male Account #: [id number] Procedure:                Upper GI endoscopy Indications:              Epigastric abdominal pain... cholelithiasis on                            imaging Medicines:                Monitored Anesthesia Care Procedure:                Pre-Anesthesia Assessment:                           - Prior to the procedure, a History and Physical                            was performed, and patient medications and                            allergies were reviewed. The patient's tolerance of                            previous anesthesia was also reviewed. The risks                            and benefits of the procedure and the sedation                            options and risks were discussed with the patient.                            All questions were answered, and informed consent                            was obtained. Prior Anticoagulants: The patient has                            taken no previous anticoagulant or antiplatelet                            agents. ASA Grade Assessment: II - A patient with                            mild systemic disease. After reviewing the risks                            and benefits, the patient was deemed in                            satisfactory condition to undergo the procedure.  After obtaining informed consent, the endoscope was                            passed under direct vision. Throughout the                            procedure, the patient's blood pressure, pulse, and                            oxygen saturations were monitored continuously. The                            Endoscope was introduced through the mouth, and                            advanced to the second part of duodenum. The upper                         GI endoscopy was accomplished without difficulty.                            The patient tolerated the procedure well. Scope In: Scope Out: Findings:                 The esophagus was normal.                           The stomach was normal.                           The examined duodenum was normal.                           The cardia and gastric fundus were normal on                            retroflexion. Complications:            No immediate complications. Estimated Blood Loss:     Estimated blood loss: none. Impression:               - Normal esophagus.                           - Normal stomach.                           - Normal examined duodenum.                           - No specimens collected. Recommendation:           - Referred to general surgery "symptomatic                            cholelithiasis".                           - Resume previous diet.                           -  Continue present medications. Docia Chuck. Henrene Pastor, MD 11/25/2016 2:07:48 PM This report has been signed electronically.

## 2016-11-25 NOTE — Progress Notes (Signed)
Pt's states no medical or surgical changes since previsit or office visit. 

## 2016-11-25 NOTE — Progress Notes (Signed)
Report given to PACU, vss 

## 2016-11-26 ENCOUNTER — Telehealth: Payer: Self-pay | Admitting: *Deleted

## 2016-11-26 ENCOUNTER — Other Ambulatory Visit: Payer: Self-pay

## 2016-11-26 NOTE — Telephone Encounter (Signed)
  Follow up Call-  Call back number 11/25/2016 09/08/2014  Post procedure Call Back phone  # [phone]  Permission to leave phone message Yes Yes  Some recent data might be hidden     Patient questions:  Do you have a fever, pain , or abdominal swelling? No. Pain Score  0 *  Have you tolerated food without any problems? Yes.    Have you been able to return to your normal activities? Yes.    Do you have any questions about your discharge instructions: Diet   No. Medications  No. Follow up visit  No.  Do you have questions or concerns about your Care? No.  Actions: * If pain score is 4 or above: No action needed, pain <4.

## 2016-12-12 ENCOUNTER — Other Ambulatory Visit: Payer: Self-pay | Admitting: General Surgery

## 2016-12-12 DIAGNOSIS — I2510 Atherosclerotic heart disease of native coronary artery without angina pectoris: Secondary | ICD-10-CM | POA: Diagnosis not present

## 2016-12-12 DIAGNOSIS — K8000 Calculus of gallbladder with acute cholecystitis without obstruction: Secondary | ICD-10-CM | POA: Diagnosis not present

## 2016-12-12 DIAGNOSIS — G4730 Sleep apnea, unspecified: Secondary | ICD-10-CM | POA: Diagnosis not present

## 2016-12-12 DIAGNOSIS — S88112A Complete traumatic amputation at level between knee and ankle, left lower leg, initial encounter: Secondary | ICD-10-CM | POA: Diagnosis not present

## 2016-12-12 DIAGNOSIS — K219 Gastro-esophageal reflux disease without esophagitis: Secondary | ICD-10-CM | POA: Diagnosis not present

## 2016-12-12 DIAGNOSIS — Z6832 Body mass index (BMI) 32.0-32.9, adult: Secondary | ICD-10-CM | POA: Diagnosis not present

## 2016-12-13 ENCOUNTER — Telehealth: Payer: Self-pay | Admitting: *Deleted

## 2016-12-13 NOTE — Telephone Encounter (Signed)
Patient needs clearance for elective cholecystectomy sooner than later. Will forward for dr [name] review

## 2016-12-13 NOTE — Telephone Encounter (Signed)
Last nuclear study 10/17 low risk; ok for surgery [facility]

## 2016-12-13 NOTE — Telephone Encounter (Signed)
Will fax this note to the number provided. 

## 2016-12-16 ENCOUNTER — Other Ambulatory Visit: Payer: Self-pay | Admitting: General Surgery

## 2016-12-23 DIAGNOSIS — J309 Allergic rhinitis, unspecified: Secondary | ICD-10-CM | POA: Diagnosis not present

## 2016-12-23 DIAGNOSIS — J069 Acute upper respiratory infection, unspecified: Secondary | ICD-10-CM | POA: Diagnosis not present

## 2017-01-16 ENCOUNTER — Ambulatory Visit (INDEPENDENT_AMBULATORY_CARE_PROVIDER_SITE_OTHER): Payer: PPO | Admitting: Internal Medicine

## 2017-01-16 ENCOUNTER — Encounter: Payer: Self-pay | Admitting: Internal Medicine

## 2017-01-16 VITALS — BP 132/76 | HR 66 | Temp 98.4°F | Resp 14 | Ht 64.0 in | Wt 193.4 lb

## 2017-01-16 DIAGNOSIS — K8080 Other cholelithiasis without obstruction: Secondary | ICD-10-CM | POA: Diagnosis not present

## 2017-01-16 DIAGNOSIS — Z89512 Acquired absence of left leg below knee: Secondary | ICD-10-CM | POA: Diagnosis not present

## 2017-01-16 DIAGNOSIS — K819 Cholecystitis, unspecified: Secondary | ICD-10-CM | POA: Insufficient documentation

## 2017-01-16 DIAGNOSIS — R739 Hyperglycemia, unspecified: Secondary | ICD-10-CM | POA: Diagnosis not present

## 2017-01-16 DIAGNOSIS — Q8901 Asplenia (congenital): Secondary | ICD-10-CM

## 2017-01-16 DIAGNOSIS — E785 Hyperlipidemia, unspecified: Secondary | ICD-10-CM

## 2017-01-16 DIAGNOSIS — S88112D Complete traumatic amputation at level between knee and ankle, left lower leg, subsequent encounter: Secondary | ICD-10-CM

## 2017-01-16 DIAGNOSIS — R072 Precordial pain: Secondary | ICD-10-CM
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
## 2017-01-16 NOTE — Progress Notes (Signed)
Pre visit review using our clinic review tool, if applicable. No additional management support is needed unless otherwise documented below in the visit note. 

## 2017-01-16 NOTE — Patient Instructions (Addendum)
  GO TO THE FRONT DESK  Schedule fasting labs to be done within the next few days  Schedule your next appointment for a  physical exam in 6 months from today.  Come back in 2 months to get the [facility]  booster. Set up a nurse appointment

## 2017-01-17 ENCOUNTER — Other Ambulatory Visit (INDEPENDENT_AMBULATORY_CARE_PROVIDER_SITE_OTHER): Payer: PPO

## 2017-01-17 DIAGNOSIS — R739 Hyperglycemia, unspecified: Secondary | ICD-10-CM

## 2017-01-17 DIAGNOSIS — E785 Hyperlipidemia, unspecified: Secondary | ICD-10-CM | POA: Diagnosis not present

## 2017-01-17 LAB — HEMOGLOBIN A1C: Hgb A1c MFr Bld: 5.8 % (ref 4.6–6.5)

## 2017-01-17 LAB — LIPID PANEL
CHOL/HDL RATIO: 2
Cholesterol: 109 mg/dL (ref 0–200)
HDL: 52.5 mg/dL (ref >39.00)
LDL Cholesterol: 34 mg/dL (ref 0–99)
NonHDL: 56.5
TRIGLYCERIDES: 111 mg/dL (ref 0.0–149.0)
VLDL: 22.2 mg/dL (ref 0.0–40.0)

## 2017-01-17 NOTE — Assessment & Plan Note (Signed)
PLAN: Mild hyperglycemia: Check A1c HTN: Well controlled on diltiazem Hyperlipidemia: On Crestor, recent LFTs normal, check a FLP Symptomatic Cholelithiasis: Since the last visit, went to the ER, had a CT, abdominal ultrasound, GI eval, EGD, saw surgery: Records reviewed, he has symptomatic cholecystitis, somewhat hesitant about surgery. I agree with Dr. Dalbert Batman evaluation, he needs surgery, risk of pancreatitis or other severe problems discussed. He would like a second opinion will try to arrange through Dr. Rosendo Gros. Recommend ER if pain resurface. States he won't go to the ER again. Asplenia: records review  BEXERO #1 today, repeat in 2 months. CAD: Taking  only 60 mg  Of imdur, no chest pain. Amputee: Multiple prescription provided RTC 6 months, CPX

## 2017-01-20 ENCOUNTER — Encounter: Payer: Self-pay | Admitting: Internal Medicine

## 2017-01-21 ENCOUNTER — Telehealth: Payer: Self-pay | Admitting: Internal Medicine

## 2017-01-21 NOTE — Telephone Encounter (Signed)
error 

## 2017-02-03 ENCOUNTER — Ambulatory Visit: Payer: PPO | Admitting: Internal Medicine

## 2017-02-06 ENCOUNTER — Ambulatory Visit: Payer: Medicare Other | Admitting: Internal Medicine

## 2017-02-06 ENCOUNTER — Telehealth: Payer: Self-pay | Admitting: Internal Medicine

## 2017-02-06 NOTE — Telephone Encounter (Signed)
Caller name: Relationship to patient: Self Can be reached: [phone]  Pharmacy:  Reason for call: Request call back to discuss liners for artifical leg

## 2017-02-06 NOTE — Telephone Encounter (Signed)
Patient states he would like to speak with you regarding message below

## 2017-02-06 NOTE — Telephone Encounter (Signed)
Spoke w/ Pt, he is needing me to call Mechele Claude at [facility] tomorrow [phone] do.

## 2017-02-06 NOTE — Telephone Encounter (Signed)
Spoke w/ Pt, informed that we had received form from [facility] which has been completed by PCP and faxed to [phone]. Pt verbalized understanding.

## 2017-02-07 NOTE — Telephone Encounter (Signed)
Called back and spoke w/ Hilda Blades at [facility] informed of Pt's situation- they will allow for another provider to sign Rx and have another OV note in PCP absence. Spoke w/ Pt, appt scheduled w/ Dr. Nani Ravens on 02/10/2017 at 10.

## 2017-02-07 NOTE — Telephone Encounter (Addendum)
Spoke w/ Mechele Claude at [facility] needed for prosthetic liners and socks. OV note from 01/16/2017 needs to be addended by PCP and needs to state that the liners and socks are worn out and have holes in them and Pt needs new ones. Informed that PCP would be out of office from 6/15 through 6/25, questioned if this was something I could addend or call into Medicare about; she is unsure if Medicare would accept my addendum and PA's for prosthetic supplies can't be completed cover the telephone. Informed Mechele Claude I would try to reach out to PCP to see if he has left town yet but if so Pt would either have to wait for PCP return or Pt could pay OOP (out of pocket) and contact Medicare regarding reimbursement. Asked Mechele Claude what the "lifetime expectancy" on these liners typically are- informed they typically last ~6 months.

## 2017-02-08 ENCOUNTER — Encounter: Payer: Self-pay | Admitting: Internal Medicine

## 2017-02-10 ENCOUNTER — Ambulatory Visit (INDEPENDENT_AMBULATORY_CARE_PROVIDER_SITE_OTHER): Payer: PPO | Admitting: Family Medicine

## 2017-02-10 ENCOUNTER — Encounter: Payer: Self-pay | Admitting: Family Medicine

## 2017-02-10 VITALS — BP 120/70 | HR 63 | Temp 97.9°F | Ht 64.0 in | Wt 196.6 lb

## 2017-02-10 DIAGNOSIS — Z89512 Acquired absence of left leg below knee: Secondary | ICD-10-CM | POA: Diagnosis not present

## 2017-02-10 NOTE — Patient Instructions (Signed)
We will reach out to you after we send it my office note.   Let us know if you need anything.

## 2017-02-10 NOTE — Progress Notes (Addendum)
Chief Complaint  Patient presents with  . Rx for prosthetic liners    Subjective: Patient is a 71 y.o. male here for rx for prosthetic liners.  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 His liners have worn out and he needs our office visit to state specific instructions and declaration of need. He is very frustrated. He would like to be called after we sent over the OV to be notified and see if anything else needs to be done.   His liner has a hole in it. It will rub against his skin and cause skin breakdown if not replaced. He will be unable to use his prosthesis in this case. He is currently wearing a replacement at this time and not having issues with breakdown.   ROS: Skin: No current skin breakdown  Family History  Problem Relation Age of Onset  . Heart attack Father   . Heart attack Paternal Grandfather   . Cancer Mother        "male ca"  . Colon cancer Neg Hx   . Prostate cancer Neg Hx   . Diabetes Neg Hx   . Esophageal cancer Neg Hx   . Rectal cancer Neg Hx   . Stomach cancer Neg Hx    Past Medical History:  Diagnosis Date  . Arthritis 1968   pt wears a lt bk prosthesis  . Bronchitis, chronic ([place])   . CAD (coronary artery disease)    Non-ST segment elevation myocardial infarction in 2005 with drug-eluting stent to the left anterior descending. 08-2009 - non-ST segment elevation myocardial infarction, felt secondary to vasospasm.  . Calculus, kidney    hx  . Complication of anesthesia    itching after preop med  ?name - 2013, no SOB  . Glaucoma   . Hyperlipidemia   . Hypertension   . Myocardial infarction [facility]) 2005   x2, 2005, 2010  . OSA (obstructive sleep apnea)    AHI 68/hr in 2002-uses a cpap  . Pneumonia    hx of   . Sleep apnea    wears CPAP  . Wears glasses    Allergies  Allergen Reactions  . Penicillins Hives and Other (See Comments)    syncope  . Tylox [Oxycodone-Acetaminophen] Other (See Comments)    "messes me up"  . Codeine  Nausea Only    Current Outpatient Prescriptions:  .  Aspirin (ADULT ASPIRIN LOW STRENGTH) 81 MG EC tablet, Take 81 mg by mouth every evening. , Disp: , Rfl:  .  diltiazem (DILACOR XR) 240 MG 24 hr capsule, Take 240 mg by mouth every evening. , Disp: , Rfl:  .  Glucosamine HCl (GLUCOSAMINE PO), Take 1 tablet by mouth every evening. , Disp: , Rfl:  .  isosorbide mononitrate (IMDUR) 60 MG 24 hr tablet, Take 1 tablet (60 mg total) by mouth daily., Disp: , Rfl:  .  magnesium citrate SOLN, Take 0.5 Bottles by mouth once., Disp: , Rfl:  .  nitroGLYCERIN (NITROSTAT) 0.4 MG SL tablet, PLACE 1 TABLET (0.4 MG TOTAL) UNDER THE TONGUE EVERY 5 (FIVE) MINUTES AS NEEDED FOR CHEST PAIN., Disp: 25 tablet, Rfl: 11 .  omeprazole (PRILOSEC) 40 MG capsule, Take 1 capsule (40 mg total) by mouth 2 (two) times daily., Disp: 60 capsule, Rfl: 2 .  potassium citrate (UROCIT-K) 10 MEQ (1080 MG) SR tablet, TAKE 2 TABLETS BY MOUTH EVERY DAY, Disp: 180 tablet, Rfl: 2 .  rosuvastatin (CRESTOR) 40 MG tablet, Take 1  tablet (40 mg total) by mouth daily., Disp: 90 tablet, Rfl: 3  Objective: BP 120/70 (BP Location: Left Arm, Patient Position: Sitting, Cuff Size: Normal)   Pulse 63   Temp 97.9 F (36.6 C) (Oral)   Ht 5\' 4"  (1.626 m)   Wt 196 lb 9.6 oz (89.2 kg)   SpO2 98%   BMI 33.75 kg/m  General: Awake, appears stated age Lungs: No accessory muscle use MSK: BKA on L noted, stump with intact skin, clean and dry, no open lesions.  Psych: Age appropriate judgment and insight, normal affect and mood  Assessment and Plan: Hx of BKA, left ([place]) - Plan: DME Other see comment  The patient's liner and socks are worn out with holes in them. He needs new ones. The life expectancy of his liners have expired.  He is frustrated that he has to return for this. Requesting call back after we fax over [place]. Also requesting a recall letter and that I place in my note what future notes need to say "The patient's liner and socks are worn out  with holes in them. He needs new ones." For the rx, they stated it needs to state: "Please provide below knee amputee socks and liners; dispense 2 single ply socks" for the "initial rx" per their office. Medicare form to follow. Follow up as originally scheduled with Dr. Larose Kells. The patient voiced understanding and agreement to the plan.  [place], DO 02/10/17  11:19 AM

## 2017-02-10 NOTE — Addendum Note (Signed)
Addended by: Ames Coupe on: 02/10/2017 11:20 AM   Modules accepted: Orders

## 2017-02-11 NOTE — Telephone Encounter (Signed)
Received prosthetic liner, sock Rx from [facility] for Dr. Nani Ravens to sign. Form signed and faxed to [phone]. Pt informed via MyChart, instructed to let us know if he continues having issues receiving his supplies. Rx sent for scanning.

## 2017-02-13 DIAGNOSIS — K8020 Calculus of gallbladder without cholecystitis without obstruction: Secondary | ICD-10-CM | POA: Diagnosis not present

## 2017-02-21 DIAGNOSIS — Z89512 Acquired absence of left leg below knee: Secondary | ICD-10-CM | POA: Diagnosis not present

## 2017-03-05 ENCOUNTER — Other Ambulatory Visit: Payer: Self-pay | Admitting: Internal Medicine

## 2017-03-05 NOTE — Telephone Encounter (Signed)
Devin Paganini do you know anything about this pt.  Devin Martin got a refill request from another office for omeprazole but Devin Martin said it is a different strength and Dr Henrene Pastor had asked the pt to see a Psychologist, sport and exercise.  He saw Henrene Pastor for EGD on 11/25/2016

## 2017-03-05 NOTE — Telephone Encounter (Signed)
I received this routed prescription from a CMA not in our office.  Can you please check into this as it appears to be a different dose than he was on previously.  Please confirm exactly what he is taking, etc.  Did he ever see the surgeons as recommended by Dr. Henrene Pastor after his EGD?  Thank you,  Jess

## 2017-03-20 ENCOUNTER — Ambulatory Visit: Payer: PPO

## 2017-03-21 ENCOUNTER — Ambulatory Visit (INDEPENDENT_AMBULATORY_CARE_PROVIDER_SITE_OTHER): Payer: PPO

## 2017-03-21 DIAGNOSIS — Z23 Encounter for immunization: Secondary | ICD-10-CM

## 2017-03-21 NOTE — Progress Notes (Signed)
.    Pre visit review using our clinic tool,if applicable. No additional management support is needed unless otherwise documented below in the visit note.   Patient in for Bexsero injection per verbal order from Dr.Paz.  Patient would like to know why he is having to get Bexsero immunization. After consulting with Dr. Barnie Alderman' nurse patient does not have a spleen per his report. Informed patient-  Patient states he has recently learned after having MRI that he does have spleen and was shown on MRI report.  Dr. Larose Kells notifed. States patient can still have immunization if he would like. Spoke with patient who agreed to have Bexsero IMM today. Given 0.5 ml IM left deltoid

## 2017-05-27 ENCOUNTER — Other Ambulatory Visit: Payer: Self-pay | Admitting: Cardiology
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
## 2017-06-16 ENCOUNTER — Ambulatory Visit: Payer: PPO | Admitting: Cardiology

## 2017-06-20 DIAGNOSIS — I10 Essential (primary) hypertension: Secondary | ICD-10-CM | POA: Diagnosis not present

## 2017-06-20 DIAGNOSIS — H524 Presbyopia: Secondary | ICD-10-CM | POA: Diagnosis not present

## 2017-06-20 DIAGNOSIS — H35033 Hypertensive retinopathy, bilateral: Secondary | ICD-10-CM | POA: Diagnosis not present

## 2017-06-21 ENCOUNTER — Other Ambulatory Visit: Payer: Self-pay | Admitting: Cardiology

## 2017-06-23 ENCOUNTER — Other Ambulatory Visit: Payer: Self-pay | Admitting: *Deleted

## 2017-06-23 MED ORDER — ROSUVASTATIN CALCIUM 40 MG PO TABS: 40.0000 mg | ORAL_TABLET | Freq: Every day | ORAL | 0 refills | Status: DC

## 2017-06-25 ENCOUNTER — Encounter: Payer: Self-pay | Admitting: Physician Assistant

## 2017-06-25 ENCOUNTER — Ambulatory Visit (INDEPENDENT_AMBULATORY_CARE_PROVIDER_SITE_OTHER): Payer: PPO | Admitting: Physician Assistant

## 2017-06-25 VITALS — BP 108/62 | HR 64 | Resp 16 | Ht 65.0 in | Wt 196.6 lb

## 2017-06-25 DIAGNOSIS — I10 Essential (primary) hypertension: Secondary | ICD-10-CM | POA: Diagnosis not present

## 2017-06-25 DIAGNOSIS — Z9989 Dependence on other enabling machines and devices: Secondary | ICD-10-CM | POA: Diagnosis not present

## 2017-06-25 DIAGNOSIS — G4733 Obstructive sleep apnea (adult) (pediatric): Secondary | ICD-10-CM

## 2017-06-25 DIAGNOSIS — E785 Hyperlipidemia, unspecified: Secondary | ICD-10-CM | POA: Diagnosis not present

## 2017-06-25 DIAGNOSIS — I2510 Atherosclerotic heart disease of native coronary artery without angina pectoris: Secondary | ICD-10-CM | POA: Diagnosis not present

## 2017-06-25 NOTE — Patient Instructions (Signed)
Almyra Deforest, PA-C recommends that you schedule a follow-up appointment in 6-9 months with Dr [name]. You will receive a reminder letter in the mail two months in advance. If you don't receive a letter, please call our office to schedule the follow-up appointment.  If you need a refill on your cardiac medications before your next appointment, please call your pharmacy.
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
## 2017-06-27 ENCOUNTER — Encounter: Payer: Self-pay | Admitting: Physician Assistant

## 2017-06-27 DIAGNOSIS — H2513 Age-related nuclear cataract, bilateral: Secondary | ICD-10-CM | POA: Diagnosis not present

## 2017-06-27 DIAGNOSIS — H2512 Age-related nuclear cataract, left eye: Secondary | ICD-10-CM | POA: Diagnosis not present

## 2017-06-28 DIAGNOSIS — Z0279 Encounter for issue of other medical certificate: Secondary | ICD-10-CM

## 2017-07-08 ENCOUNTER — Telehealth: Payer: Self-pay | Admitting: Internal Medicine

## 2017-07-08 ENCOUNTER — Ambulatory Visit: Payer: PPO | Admitting: Internal Medicine

## 2017-07-08 NOTE — Telephone Encounter (Signed)
appt for today canceled. Mechele Claude says pt needed to be seen. Please return call.

## 2017-07-08 NOTE — Telephone Encounter (Signed)
Pt informed, original forms given back to Pt and copies for chart sent for scanning.

## 2017-07-08 NOTE — Telephone Encounter (Signed)
Pt called states he needs a letter that says he was seen today and he needs rubber liners for knee, current one is worn out. Also provider needs to say in letter pt also needs an outer surface covering.  MADE APPT for this Thursday 07/10/17 with [place]. Please advise if Larose Kells will write the letter without an appoirtment. If not he will come this Thursday at 9:20 am. Please advise. [facility] fax [phone].

## 2017-07-08 NOTE — Telephone Encounter (Signed)
Patient here for a follow-up but states he does not need to be seen, just needs his forms completed. Forms filled out, recommend to schedule a CPX as his convenience

## 2017-07-08 NOTE — Telephone Encounter (Signed)
Spoke w/ Mechele Claude, she informed that Pt needed to be seen or notes written for his orthotic supplies. Informed Mechele Claude that Pt had an appt scheduled this morning but only wanted forms completed and not to be seen. Informed that for notes he will need to be seen.

## 2017-07-08 NOTE — Telephone Encounter (Signed)
No, Pt needs appointment.

## 2017-07-10 ENCOUNTER — Ambulatory Visit: Payer: PPO | Admitting: Internal Medicine

## 2017-08-01 DIAGNOSIS — Z89512 Acquired absence of left leg below knee: Secondary | ICD-10-CM | POA: Diagnosis not present

## 2017-08-07 DIAGNOSIS — G4733 Obstructive sleep apnea (adult) (pediatric): Secondary | ICD-10-CM | POA: Diagnosis not present

## 2017-08-24 ENCOUNTER — Other Ambulatory Visit: Payer: Self-pay | Admitting: Cardiology

## 2017-08-24 DIAGNOSIS — R072 Precordial pain: Secondary | ICD-10-CM

## 2017-09-04 DIAGNOSIS — H2512 Age-related nuclear cataract, left eye: Secondary | ICD-10-CM | POA: Diagnosis not present

## 2017-09-04 DIAGNOSIS — Z961 Presence of intraocular lens: Secondary | ICD-10-CM | POA: Diagnosis not present

## 2017-09-22 ENCOUNTER — Encounter: Payer: Self-pay | Admitting: Internal Medicine

## 2017-09-22 NOTE — Progress Notes (Addendum)
Subjective:   Devin Martin is a 72 y.o. male who presents for Medicare Annual/Subsequent preventive examination. The Patient was informed that the wellness visit is to identify future health risk and educate and initiate measures that can reduce risk for increased disease through the lifespan.   Describes health as fair, good or great? great   Review of Systems: No ROS.  Medicare Wellness Visit. Additional risk factors are reflected in the social history. Cardiac Risk Factors include: advanced age (>49men, >29 women);male gender;hypertension Sleep patterns: Wears CPAP. Sleeps 6 hrs.  Home Safety/Smoke Alarms: Feels safe in home. Smoke alarms in place.  Living environment; residence and Firearm Safety: Lives with wife. No issues with stairs.  Seat Belt Safety/Bike Helmet: Wears seat belt.  Male:   CCS- last 09/08/14: recall 10 yrs     PSA-  Lab Results  Component Value Date   PSA 0.90 02/06/2016   PSA 0.75 01/29/2013   PSA 0.71 08/13/2011       Objective:    Vitals: BP (!) 110/54 (BP Location: Left Arm, Patient Position: Sitting, Cuff Size: Normal)   Pulse 64   Ht 5\' 5"  (1.651 m)   Wt 196 lb 12.8 oz (89.3 kg)   SpO2 97%   BMI 32.75 kg/m   Body mass index is 32.75 kg/m.  Advanced Directives 11/25/2016 09/24/2016 02/21/2016 09/22/2015 08/30/2014 07/31/2013 07/31/2013  Does Patient Have a Medical Advance Directive? Yes Yes No Yes Yes Patient has advance directive, copy not in chart Patient has advance directive, copy in chart  Type of Advance Directive - [place];Living will - [place];Living will [place] will  Does patient want to make changes to medical advance directive? - - - No - Patient declined - - -  Copy of [place] in Chart? - No - copy requested - No - copy requested - Copy requested from family -  Pre-existing out of facility DNR order (yellow form or pink MOST form) - - - - - No No     Tobacco Social History   Tobacco Use  Smoking Status Former Smoker  . Packs/day: 0.75  . Years: 20.00  . Pack years: 15.00  . Types: Cigarettes  Smokeless Tobacco Never Used  Tobacco Comment   currently doing some e-cigarrets (started at age 72. less than 1 ppd.)      Counseling given: Not Answered Comment: currently doing some e-cigarrets (started at age 17. less than 1 ppd.)    Clinical Intake: Pain : No/denies pain   Past Medical History:  Diagnosis Date  . Arthritis 1968   pt wears a lt bk prosthesis  . Bronchitis, chronic ([place])   . CAD (coronary artery disease)    Non-ST segment elevation myocardial infarction in 2005 with drug-eluting stent to the left anterior descending. 08-2009 - non-ST segment elevation myocardial infarction, felt secondary to vasospasm.  . Calculus, kidney    hx  . Complication of anesthesia    itching after preop med  ?name - 2013, no SOB  . Glaucoma   . Hyperlipidemia   . Hypertension   . Myocardial infarction [facility]) 2005   x2, 2005, 2010  . OSA (obstructive sleep apnea)    AHI 68/hr in 2002-uses a cpap  . Pneumonia    hx of   . Sleep apnea    wears CPAP  . Wears glasses    Past Surgical History:  Procedure Laterality Date  .  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 CARDIAC CATHETERIZATION    . CYST REMOVAL TRUNK  09/04/2012   Procedure: CYST REMOVAL TRUNK;  Surgeon: Ralene Ok, MD;  Location: [facility];  Service: General;  Laterality: N/A;  EXCISION OF MID BACK MASS  . EYE SURGERY Left DR.[place]   CATARACT SX1/2019  . INCISION AND DRAINAGE ABSCESS Left 07/31/2013   Procedure: INCISIONAL/NON-INCISIONAL DEBRIDEMENT OF ABSCESS LEFT KNEE;  Surgeon: Mauri Pole, MD;  Location[facility];  Service: Orthopedics;  Laterality: Left;  . KIDNEY STONE SURGERY     multiple episodes   . Right Knee Arthroscopy  2010   [place] [facility]  . TONSILLECTOMY     Family History  Problem Relation Age of Onset  . Heart  attack Father   . Heart attack Paternal Grandfather   . Cancer Mother        "male ca"  . Colon cancer Neg Hx   . Prostate cancer Neg Hx   . Diabetes Neg Hx   . Esophageal cancer Neg Hx   . Rectal cancer Neg Hx   . Stomach cancer Neg Hx    Social History   Socioeconomic History  . Marital status: Married    Spouse name: None  . Number of children: 2  . Years of education: None  . Highest education level: None  Social Needs  . Financial resource strain: None  . Food insecurity - worry: None  . Food insecurity - inability: None  . Transportation needs - medical: None  . Transportation needs - non-medical: None  Occupational History  . Occupation: Therapist, sports-- RETIRED 2012  Tobacco Use  . Smoking status: Former Smoker    Packs/day: 0.75    Years: 20.00    Pack years: 15.00    Types: Cigarettes  . Smokeless tobacco: Never Used  . Tobacco comment: currently doing some e-cigarrets (started at age 34. less than 1 ppd.)   Substance and Sexual Activity  . Alcohol use: Yes    Comment: bourbon every other night.  . Drug use: No  . Sexual activity: No  Other Topics Concern  . None  Social History Narrative   Lives w/ wife     Outpatient Encounter Medications as of 09/26/2017  Medication Sig  . Aspirin (ADULT ASPIRIN LOW STRENGTH) 81 MG EC tablet Take 81 mg by mouth every evening.   . diltiazem (TIAZAC) 240 MG 24 hr capsule TAKE 1 CAPSULE BY MOUTH EVERY DAY  . Glucosamine HCl (GLUCOSAMINE PO) Take 1 tablet by mouth every evening.   . isosorbide mononitrate (IMDUR) 60 MG 24 hr tablet Take 1 tablet (60 mg total) by mouth daily.  . nitroGLYCERIN (NITROSTAT) 0.4 MG SL tablet PLACE 1 TABLET (0.4 MG TOTAL) UNDER THE TONGUE EVERY 5 (FIVE) MINUTES AS NEEDED FOR CHEST PAIN.  Marland Kitchen omeprazole (PRILOSEC) 40 MG capsule Take 1 capsule (40 mg total) by mouth 2 (two) times daily. (Patient taking differently: Take 40 mg by mouth daily. )  . potassium citrate (UROCIT-K) 10 MEQ (1080 MG) SR  tablet TAKE 2 TABLETS BY MOUTH EVERY DAY  . rosuvastatin (CRESTOR) 40 MG tablet TAKE 1 TABLET BY MOUTH EVERY DAY  . [DISCONTINUED] isosorbide mononitrate (IMDUR) 60 MG 24 hr tablet TAKE 1 AND 1/2 TABLETS(90 MG) BY MOUTH DAILY  . [DISCONTINUED] magnesium citrate SOLN Take 0.5 Bottles by mouth once.   Facility-Administered Encounter Medications as of 09/26/2017  Medication  . 0.9 %  sodium chloride  infusion    Activities of Daily Living In your present state of health, do you have any difficulty performing the following activities: 09/26/2017  Hearing? N  Vision? N  Comment Just had cataract sx.  Difficulty concentrating or making decisions? N  Walking or climbing stairs? N  Dressing or bathing? N  Doing errands, shopping? N  Preparing Food and eating ? N  Using the Toilet? N  In the past six months, have you accidently leaked urine? N  Do you have problems with loss of bowel control? N  Managing your Medications? N  Managing your Finances? N  Housekeeping or managing your Housekeeping? N  Some recent data might be hidden    Patient Care Team: Colon Branch, MD as PCP - General (Internal Medicine) [name], Denice Bors, MD as Consulting Physician (Cardiology) Iona Beard, [place] as Referring Physician (Optometry) Orie Rout as Referring Physician (Dentistry) Paralee Cancel, MD as Consulting Physician (Orthopedic Surgery) Fanny Skates, MD as Consulting Physician (General Surgery)   Assessment:   This is a routine wellness examination for Devin Martin. Physical assessment deferred to PCP.   Exercise Activities and Dietary recommendations Current Exercise Habits: Home exercise routine, Type of exercise: strength training/weights;treadmill, Time (Minutes): > 60, Frequency (Times/Week): 6, Weekly Exercise (Minutes/Week): 0 Diet (meal preparation, eat out, water intake, caffeinated beverages, dairy products, fruits and vegetables): in general, a "healthy" diet  . Eats lots of fruits, vegetables, and  seafood.    Goals    . Maintain healthy active lifestyle. (pt-stated)    . Weight (lb) < 190 lb (86.2 kg)       Fall Risk Fall Risk  09/26/2017 09/24/2016 08/07/2016 05/06/2016 09/22/2015  Falls in the past year? No No No No No  Risk for fall due to : - - - - -  Risk for fall due to: Comment - - - - -    Depression Screen PHQ 2/9 Scores 09/26/2017 09/24/2016 08/07/2016 05/06/2016  PHQ - 2 Score 0 0 0 0    Cognitive Function MMSE - Mini Mental State Exam 09/26/2017 09/22/2015  Orientation to time 5 5  Orientation to Place 5 5  Registration 3 3  Attention/ Calculation 5 5  Recall 3 3  Language- name 2 objects 2 2  Language- repeat 1 1  Language- follow 3 step command 3 3  Language- read & follow direction 1 1  Write a sentence 1 1  Copy design 1 1  Total score 30 30        Immunization History  Administered Date(s) Administered  . HiB (PRP-OMP) 06/23/2007  . Influenza Split 08/15/2011  . Influenza, High Dose Seasonal PF 08/07/2016  . Influenza,inj,Quad PF,6+ Mos 06/06/2014, 09/22/2015  . Meningococcal B, OMV 01/16/2017, 03/21/2017  . Meningococcal Conjugate 02/06/2016  . Meningococcal Polysaccharide 06/23/2007  . Pneumococcal Conjugate-13 09/22/2015  . Pneumococcal Polysaccharide-23 01/29/2013  . Td 08/26/2006  . Zoster 02/06/2016   Screening Tests Health Maintenance  Topic Date Due  . Hepatitis C Screening  12-02-1945  . TETANUS/TDAP  08/26/2016  . INFLUENZA VACCINE  03/26/2017  . COLONOSCOPY  09/08/2024  . PNA vac Low Risk Adult  Completed    Plan:   Follow up with PCP today as scheduled  Continue to eat heart healthy diet (full of fruits, vegetables, whole grains, lean protein, water--limit salt, fat, and sugar intake) and increase physical activity as tolerated.  Continue doing brain stimulating activities (puzzles, reading, adult coloring books, staying active) to keep memory sharp.   Bring  a copy of your living will and/or healthcare power of attorney to  your next office visit.   I have personally reviewed and noted the following in the patient's chart:   . Medical and social history . Use of alcohol, tobacco or illicit drugs  . Current medications and supplements . Functional ability and status . Nutritional status . Physical activity . Advanced directives . List of other physicians . Hospitalizations, surgeries, and ER visits in previous 12 months . Vitals . Screenings to include cognitive, depression, and falls . Referrals and appointments  In addition, I have reviewed and discussed with patient certain preventive protocols, quality metrics, and best practice recommendations. A written personalized care plan for preventive services as well as general preventive health recommendations were provided to patient.     Devin Martin [place], [place]  09/26/2017  Kathlene November, MD

## 2017-09-26 ENCOUNTER — Ambulatory Visit (INDEPENDENT_AMBULATORY_CARE_PROVIDER_SITE_OTHER): Payer: PPO | Admitting: *Deleted

## 2017-09-26 ENCOUNTER — Ambulatory Visit (INDEPENDENT_AMBULATORY_CARE_PROVIDER_SITE_OTHER): Payer: PPO | Admitting: Internal Medicine

## 2017-09-26 ENCOUNTER — Encounter: Payer: Self-pay | Admitting: *Deleted

## 2017-09-26 ENCOUNTER — Encounter: Payer: Self-pay | Admitting: Internal Medicine

## 2017-09-26 VITALS — BP 110/54 | HR 64 | Ht 65.0 in | Wt 196.0 lb

## 2017-09-26 VITALS — BP 110/54 | HR 64 | Ht 65.0 in | Wt 196.8 lb

## 2017-09-26 DIAGNOSIS — E785 Hyperlipidemia, unspecified: Secondary | ICD-10-CM

## 2017-09-26 DIAGNOSIS — Z0000 Encounter for general adult medical examination without abnormal findings: Secondary | ICD-10-CM | POA: Diagnosis not present

## 2017-09-26 DIAGNOSIS — Z23 Encounter for immunization: Secondary | ICD-10-CM

## 2017-09-26 DIAGNOSIS — Z1159 Encounter for screening for other viral diseases: Secondary | ICD-10-CM

## 2017-09-26 DIAGNOSIS — I10 Essential (primary) hypertension: Secondary | ICD-10-CM

## 2017-09-26 DIAGNOSIS — R739 Hyperglycemia, unspecified: Secondary | ICD-10-CM | POA: Diagnosis not present

## 2017-09-26 LAB — COMPREHENSIVE METABOLIC PANEL
ALBUMIN: 4.6 g/dL (ref 3.5–5.2)
ALT: 38 U/L (ref 0–53)
AST: 30 U/L (ref 0–37)
Alkaline Phosphatase: 72 U/L (ref 39–117)
BUN: 20 mg/dL (ref 6–23)
CO2: 31 mEq/L (ref 19–32)
Calcium: 10 mg/dL (ref 8.4–10.5)
Chloride: 107 mEq/L (ref 96–112)
Creatinine, Ser: 0.99 mg/dL (ref 0.40–1.50)
GFR: 79.46 mL/min (ref >60.00)
Glucose, Bld: 114 mg/dL — ABNORMAL HIGH (ref 70–99)
POTASSIUM: 4.5 meq/L (ref 3.5–5.1)
Sodium: 148 mEq/L — ABNORMAL HIGH (ref 135–145)
TOTAL PROTEIN: 7.2 g/dL (ref 6.0–8.3)
Total Bilirubin: 0.9 mg/dL (ref 0.2–1.2)

## 2017-09-26 LAB — CBC WITH DIFFERENTIAL/PLATELET
Basophils Absolute: 0 10*3/uL (ref 0.0–0.1)
Basophils Relative: 0.7 % (ref 0.0–3.0)
Eosinophils Absolute: 0.1 10*3/uL (ref 0.0–0.7)
Eosinophils Relative: 1.6 % (ref 0.0–5.0)
HCT: 45.6 % (ref 39.0–52.0)
HEMOGLOBIN: 15.4 g/dL (ref 13.0–17.0)
Lymphocytes Relative: 27.8 % (ref 12.0–46.0)
Lymphs Abs: 1.6 10*3/uL (ref 0.7–4.0)
MCHC: 33.7 g/dL (ref 30.0–36.0)
MCV: 94.1 fl (ref 78.0–100.0)
MONO ABS: 0.5 10*3/uL (ref 0.1–1.0)
Monocytes Relative: 9 % (ref 3.0–12.0)
Neutro Abs: 3.6 10*3/uL (ref 1.4–7.7)
Neutrophils Relative %: 60.9 % (ref 43.0–77.0)
Platelets: 250 10*3/uL (ref 150.0–400.0)
RBC: 4.85 Mil/uL (ref 4.22–5.81)
RDW: 12.9 % (ref 11.5–15.5)
WBC: 5.9 10*3/uL (ref 4.0–10.5)

## 2017-09-26 LAB — LIPID PANEL
CHOLESTEROL: 107 mg/dL (ref 0–200)
HDL: 67.4 mg/dL (ref >39.00)
LDL Cholesterol: 27 mg/dL (ref 0–99)
NonHDL: 40
Total CHOL/HDL Ratio: 2
Triglycerides: 67 mg/dL (ref 0.0–149.0)
VLDL: 13.4 mg/dL (ref 0.0–40.0)

## 2017-09-26 LAB — HEMOGLOBIN A1C: HEMOGLOBIN A1C: 5.8 % (ref 4.6–6.5)

## 2017-09-26 NOTE — Progress Notes (Signed)
Pre visit review using our clinic review tool, if applicable. No additional management support is needed unless otherwise documented below in the visit note. 

## 2017-09-26 NOTE — Progress Notes (Signed)
Subjective:    Patient ID: Devin Martin, male    DOB: 1946/04/09, 72 y.o.   MRN: [id number]  DOS:  09/26/2017 Type of visit - description : cpx Interval history: No major concerns   Review of Systems Reports good compliance w/  medication, good ambulatory BPs.  Good compliance with CPAP. Had left cataract surgery, was recommended not to do heavy lifting or exercise.  He continues to eat healthy. Denies chest pain, abdominal pain.  Other than above, a 14 point review of systems is negative     Past Medical History:  Diagnosis Date  . Arthritis 1968   pt wears a lt bk prosthesis  . Bronchitis, chronic ([place])   . CAD (coronary artery disease)    Non-ST segment elevation myocardial infarction in 2005 with drug-eluting stent to the left anterior descending. 08-2009 - non-ST segment elevation myocardial infarction, felt secondary to vasospasm.  . Calculus, kidney    hx  . Complication of anesthesia    itching after preop med  ?name - 2013, no SOB  . Glaucoma   . Hyperlipidemia   . Hypertension   . Myocardial infarction [facility]) 2005   x2, 2005, 2010  . OSA (obstructive sleep apnea)    AHI 68/hr in 2002-uses a cpap  . Pneumonia    hx of   . Sleep apnea    wears CPAP  . Wears glasses     Past Surgical History:  Procedure Laterality Date  . 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 CARDIAC CATHETERIZATION    . CYST REMOVAL TRUNK  09/04/2012   Procedure: CYST REMOVAL TRUNK;  Surgeon: Ralene Ok, MD;  Location: [facility];  Service: General;  Laterality: N/A;  EXCISION OF MID BACK MASS  . EYE SURGERY Left DR.[place]   CATARACT SX1/2019  . INCISION AND DRAINAGE ABSCESS Left 07/31/2013   Procedure: INCISIONAL/NON-INCISIONAL DEBRIDEMENT OF ABSCESS LEFT KNEE;  Surgeon: Mauri Pole, MD;  [facility];  Service: Orthopedics;  Laterality: Left;  . KIDNEY STONE SURGERY     multiple episodes   . Right Knee Arthroscopy  2010   [place] [facility]  .  TONSILLECTOMY     Family History  Problem Relation Age of Onset  . Heart attack Father   . Heart attack Paternal Grandfather   . Cancer Mother        "male ca"  . Colon cancer Neg Hx   . Prostate cancer Neg Hx   . Diabetes Neg Hx   . Esophageal cancer Neg Hx   . Rectal cancer Neg Hx   . Stomach cancer Neg Hx     Social History   Socioeconomic History  . Marital status: Married    Spouse name: Not on file  . Number of children: 2  . Years of education: Not on file  . Highest education level: Not on file  Social Needs  . Financial resource strain: Not on file  . Food insecurity - worry: Not on file  . Food insecurity - inability: Not on file  . Transportation needs - medical: Not on file  . Transportation needs - non-medical: Not on file  Occupational History  . Occupation: Therapist, sports-- RETIRED 2012  Tobacco Use  . Smoking status: Former Smoker    Packs/day: 0.75    Years: 20.00    Pack years: 15.00    Types: Cigarettes  . Smokeless tobacco: Never Used  . Tobacco comment: currently doing some  e-cigarrets (started at age 6. less than 1 ppd.)   Substance and Sexual Activity  . Alcohol use: Yes    Comment: bourbon 3 x/week.  . Drug use: No  . Sexual activity: No  Other Topics Concern  . Not on file  Social History Narrative   Lives w/ wife       Allergies as of 09/26/2017      Reactions   Penicillins Hives, Other (See Comments)   syncope   Tylox [oxycodone-acetaminophen] Other (See Comments)   "messes me up"   Codeine Nausea Only      Medication List        Accurate as of 09/26/17  4:56 PM. Always use your most recent med list.          ADULT ASPIRIN LOW STRENGTH 81 MG EC tablet Generic drug:  Aspirin Take 81 mg by mouth every evening.   diltiazem 240 MG 24 hr capsule Commonly known as:  TIAZAC TAKE 1 CAPSULE BY MOUTH EVERY DAY   GLUCOSAMINE PO Take 1 tablet by mouth every evening.   isosorbide mononitrate 60 MG 24 hr tablet Commonly  known as:  IMDUR Take 1 tablet (60 mg total) by mouth daily.   nitroGLYCERIN 0.4 MG SL tablet Commonly known as:  NITROSTAT PLACE 1 TABLET (0.4 MG TOTAL) UNDER THE TONGUE EVERY 5 (FIVE) MINUTES AS NEEDED FOR CHEST PAIN.   omeprazole 40 MG capsule Commonly known as:  PRILOSEC Take 1 capsule (40 mg total) by mouth 2 (two) times daily.   potassium citrate 10 MEQ (1080 MG) SR tablet Commonly known as:  UROCIT-K TAKE 2 TABLETS BY MOUTH EVERY DAY   rosuvastatin 40 MG tablet Commonly known as:  CRESTOR TAKE 1 TABLET BY MOUTH EVERY DAY          Objective:   Physical Exam BP (!) 110/54 (BP Location: Left Arm, Patient Position: Sitting, Cuff Size: Normal)   Pulse 64   Ht 5\' 5"  (1.651 m)   Wt 196 lb (88.9 kg)   SpO2 97%   BMI 32.62 kg/m  General:   Well developed, well nourished . NAD.  Neck: No  thyromegaly  HEENT:  Normocephalic . Face symmetric, atraumatic Lungs:  CTA B Normal respiratory effort, no intercostal retractions, no accessory muscle use. Heart: RRR,  no murmur.  No pretibial edema right leg. Abdomen:  Not distended, soft, non-tender. No rebound or rigidity.   Skin: Exposed areas without rash. Not pale. Not jaundice Neurologic:  alert & oriented X3.  Speech normal, gait assisted with BKA left. Strength symmetric and appropriate for age.  Psych: Cognition and judgment appear intact.  Cooperative with normal attention span and concentration.  Behavior appropriate. No anxious or depressed appearing.     Assessment & Plan:  Assessment Hyperglycemia: A1c 5.01 February 2016 HTN Hyperlipidemia GERD , sporadic sx  Obesity  OSA, + CPAP CAD: NSTEMI 2005, stent. 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 2008 --meningococcal  2008, menactra booster 2017 L LE amputation d/t  trauma Cholelithiasis per ultrasound 09-2015 H/o  Urolithiasis  PLAN: Hyperglycemia: Doing well w/ lifestyle, labs  High cholesterol: On Crestor, check a FLP Cholelithiasis:  see last visit, got a second opinion from Dr. Rosendo Gros (no documentation) patient reports was told the warning symptoms that should prompt an ER or office  visit.  No elective surgeries  planned. 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 CAD: Continue CV RF control.  Asymptomatic. rtc 6 months

## 2017-09-26 NOTE — Assessment & Plan Note (Signed)
Hyperglycemia: Doing well w/ lifestyle, labs  High cholesterol: On Crestor, check a FLP Cholelithiasis:  see last visit, got a second opinion from Dr. Rosendo Gros (no documentation) patient reports was told the warning symptoms that should prompt an ER or office  visit.  No elective surgeries planned. 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 CAD: Continue CV RF control.  Asymptomatic. rtc 6 months

## 2017-09-26 NOTE — Patient Instructions (Signed)
GO TO THE LAB : Get the blood work     GO TO THE FRONT DESK Schedule your next appointment for a  routine checkup in 6 months  

## 2017-09-26 NOTE — Assessment & Plan Note (Addendum)
Td 0 09-2017; ;zostavax 2017; pneumonia shot 23 2014, prevnar 2017;    flu shot today -CCS: colonoscopy 12/10, colonoscopy again 2016, next per GI -Prostate  cancer screening: DRE, PSA wnl 01-2016 -Labs: CMP, FLP, CBC, A1c, hep C. No fasting  -Doing well with diet, unable to exercise lately due to recent cataract surgery but plans to go back

## 2017-09-26 NOTE — Patient Instructions (Signed)
Continue to eat heart healthy diet (full of fruits, vegetables, whole grains, lean protein, water--limit salt, fat, and sugar intake) and increase physical activity as tolerated.  Continue doing brain stimulating activities (puzzles, reading, adult coloring books, staying active) to keep memory sharp.   Bring a copy of your living will and/or healthcare power of attorney to your next office visit.   Devin Martin , Thank you for taking time to come for your Medicare Wellness Visit. I appreciate your ongoing commitment to your health goals. Please review the following plan we discussed and let me know if I can assist you in the future.   These are the goals we discussed: Goals    . Maintain healthy active lifestyle. (pt-stated)    . Weight (lb) < 190 lb (86.2 kg)       This is a list of the screening recommended for you and due dates:  Health Maintenance  Topic Date Due  .  Hepatitis C: One time screening is recommended by Center for Disease Control  (CDC) for  adults born from [birth year] through 1963.   [birth year]  . Tetanus Vaccine  08/26/2016  . Flu Shot  03/26/2017  . Colon Cancer Screening  09/08/2024  . Pneumonia vaccines  Completed   Health Maintenance, Male A healthy lifestyle and preventive care is important for your health and wellness. Ask your health care provider about what schedule of regular examinations is right for you. What should I know about weight and diet? Eat a Healthy Diet  Eat plenty of vegetables, fruits, whole grains, low-fat dairy products, and lean protein.  Do not eat a lot of foods high in solid fats, added sugars, or salt.  Maintain a Healthy Weight Regular exercise can help you achieve or maintain a healthy weight. You should:  Do at least 150 minutes of exercise each week. The exercise should increase your heart rate and make you sweat (moderate-intensity exercise).  Do strength-training exercises at least twice a week.  Watch Your Levels of  Cholesterol and Blood Lipids  Have your blood tested for lipids and cholesterol every 5 years starting at 70 years of age. If you are at high risk for heart disease, you should start having your blood tested when you are 70 years old. You may need to have your cholesterol levels checked more often if: ? Your lipid or cholesterol levels are high. ? You are older than 70 years of age. ? You are at high risk for heart disease.  What should I know about cancer screening? Many types of cancers can be detected early and may often be prevented. Lung Cancer  You should be screened every year for lung cancer if: ? You are a current smoker who has smoked for at least 30 years. ? You are a former smoker who has quit within the past 15 years.  Talk to your health care provider about your screening options, when you should start screening, and how often you should be screened.  Colorectal Cancer  Routine colorectal cancer screening usually begins at 70 years of age and should be repeated every 5-10 years until you are 70 years old. You may need to be screened more often if early forms of precancerous polyps or small growths are found. Your health care provider may recommend screening at an earlier age if you have risk factors for colon cancer.  Your health care provider may recommend using home test kits to check for hidden blood in the stool.  A  small camera at the end of a tube can be used to examine your colon (sigmoidoscopy or colonoscopy). This checks for the earliest forms of colorectal cancer.  Prostate and Testicular Cancer  Depending on your age and overall health, your health care provider may do certain tests to screen for prostate and testicular cancer.  Talk to your health care provider about any symptoms or concerns you have about testicular or prostate cancer.  Skin Cancer  Check your skin from head to toe regularly.  Tell your health care provider about any new moles or changes  in moles, especially if: ? There is a change in a mole's size, shape, or color. ? You have a mole that is larger than a pencil eraser.  Always use sunscreen. Apply sunscreen liberally and repeat throughout the day.  Protect yourself by wearing long sleeves, pants, a wide-brimmed hat, and sunglasses when outside.  What should I know about heart disease, diabetes, and high blood pressure?  If you are 52-22 years of age, have your blood pressure checked every 3-5 years. If you are 90 years of age or older, have your blood pressure checked every year. You should have your blood pressure measured twice-once when you are at a hospital or clinic, and once when you are not at a hospital or clinic. Record the average of the two measurements. To check your blood pressure when you are not at a hospital or clinic, you can use: ? An automated blood pressure machine at a pharmacy. ? A home blood pressure monitor.  Talk to your health care provider about your target blood pressure.  If you are between 55-20 years old, ask your health care provider if you should take aspirin to prevent heart disease.  Have regular diabetes screenings by checking your fasting blood sugar level. ? If you are at a normal weight and have a low risk for diabetes, have this test once every three years after the age of 43. ? If you are overweight and have a high risk for diabetes, consider being tested at a younger age or more often.  A one-time screening for abdominal aortic aneurysm (AAA) by ultrasound is recommended for men aged 48-75 years who are current or former smokers. What should I know about preventing infection? Hepatitis B If you have a higher risk for hepatitis B, you should be screened for this virus. Talk with your health care provider to find out if you are at risk for hepatitis B infection. Hepatitis C Blood testing is recommended for:  Everyone born from [birth year] through 1963.  Anyone with known risk factors  for hepatitis C.  Sexually Transmitted Diseases (STDs)  You should be screened each year for STDs including gonorrhea and chlamydia if: ? You are sexually active and are younger than 70 years of age. ? You are older than 70 years of age and your health care provider tells you that you are at risk for this type of infection. ? Your sexual activity has changed since you were last screened and you are at an increased risk for chlamydia or gonorrhea. Ask your health care provider if you are at risk.  Talk with your health care provider about whether you are at high risk of being infected with HIV. Your health care provider may recommend a prescription medicine to help prevent HIV infection.  What else can I do?  Schedule regular health, dental, and eye exams.  Stay current with your vaccines (immunizations).  Do not use any  tobacco products, such as cigarettes, chewing tobacco, and e-cigarettes. If you need help quitting, ask your health care provider.  Limit alcohol intake to no more than 2 drinks per day. One drink equals 12 ounces of beer, 5 ounces of wine, or 1 ounces of hard liquor.  Do not use street drugs.  Do not share needles.  Ask your health care provider for help if you need support or information about quitting drugs.  Tell your health care provider if you often feel depressed.  Tell your health care provider if you have ever been abused or do not feel safe at home. This information is not intended to replace advice given to you by your health care provider. Make sure you discuss any questions you have with your health care provider. Document Released: 02/08/2008 Document Revised: 04/10/2016 Document Reviewed: 05/16/2015 Elsevier Interactive Patient Education  [employer].

## 2017-09-27 LAB — HEPATITIS C ANTIBODY
Hepatitis C Ab: NONREACTIVE
SIGNAL TO CUT-OFF: 0.01 (ref <1.00)

## 2017-09-30 ENCOUNTER — Ambulatory Visit: Payer: PPO | Admitting: Pulmonary Disease

## 2017-09-30 ENCOUNTER — Encounter: Payer: Self-pay | Admitting: Internal Medicine

## 2017-10-13 DIAGNOSIS — L820 Inflamed seborrheic keratosis: Secondary | ICD-10-CM | POA: Diagnosis not present

## 2017-11-03 ENCOUNTER — Ambulatory Visit: Payer: PPO | Admitting: Pulmonary Disease

## 2017-11-25 ENCOUNTER — Other Ambulatory Visit: Payer: Self-pay | Admitting: Cardiology

## 2017-11-25 ENCOUNTER — Other Ambulatory Visit: Payer: Self-pay | Admitting: Internal Medicine

## 2017-11-25 DIAGNOSIS — R072 Precordial pain: Secondary | ICD-10-CM

## 2017-11-25 NOTE — Telephone Encounter (Signed)
Rx has been sent to the pharmacy electronically. ° °

## 2017-12-11 DIAGNOSIS — H6121 Impacted cerumen, right ear: Secondary | ICD-10-CM | POA: Diagnosis not present

## 2017-12-19 ENCOUNTER — Ambulatory Visit: Payer: PPO | Admitting: Pulmonary Disease

## 2017-12-19 ENCOUNTER — Encounter: Payer: Self-pay | Admitting: Pulmonary Disease

## 2017-12-19 VITALS — BP 128/68 | HR 66 | Ht 65.0 in | Wt 195.8 lb

## 2017-12-19 DIAGNOSIS — G4733 Obstructive sleep apnea (adult) (pediatric): Secondary | ICD-10-CM

## 2017-12-19 NOTE — Patient Instructions (Signed)
Follow up in 1 year.

## 2017-12-19 NOTE — Progress Notes (Signed)
[facility]  Chief Complaint  Patient presents with  . Follow-up    Pt is doing well overall with cpap machine    Vital signs: BP 128/68 (BP Location: Left Arm, Cuff Size: Normal)   Pulse 66   Ht 5\' 5"  (1.651 m)   Wt 195 lb 12.8 oz (88.8 kg)   SpO2 94%   BMI 32.58 kg/m   History of Present Illness: Devin Martin is a 72 y.o. male with obstructive sleep apnea.  Previously seen by Dr. Gwenette Greet.  Sleep study in 2002 showed severe OSA.  Using CPAP nightly.  No issues with mask.  Sleeps well.  Feels rested.     Physical Exam:  General - pleasant Eyes - pupils reactive ENT - no sinus tenderness, no oral exudate, no LAN, s/p UPPP Cardiac - regular, no murmur Chest - no wheeze, rales Abd - soft, non tender Ext - prosthetic left leg Skin - no rashes Neuro - normal strength Psych - normal mood   Assessment/Plan:  Obstructive sleep apnea. - he is compliant with CPAP and reports benefit - continue auto CPAP   Patient Instructions  Follow up in 1 year    Chesley Mires, MD [place] 12/19/2017, 2:31 PM  Flow Sheet  Sleep tests: PSG 03/31/01 >> AHI 68 Auto CPAP 11/19/17 to 12/18/17 >> used on 30 of 30 nights with average 7 hrs 11 min.  Average AHI 1.5 with median CPAP 12 and 95 th percentile CPAP 13 cm H2O  Past Medical History: He  has a past medical history of Arthritis (1968), Bronchitis, chronic ([place]), CAD (coronary artery disease), Calculus, kidney, Complication of anesthesia, Glaucoma, Hyperlipidemia, Hypertension, Myocardial infarction ([place]) (2005), OSA (obstructive sleep apnea), Pneumonia, Sleep apnea, and Wears glasses.  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  Family History: His family history includes Cancer in his mother; Heart attack in his father and paternal grandfather.  Social History: He  reports that he has quit smoking. His smoking use included cigarettes. He has a 15.00 pack-year smoking history. He has never used smokeless tobacco. He reports that he drinks alcohol. He reports that he does not use drugs.  Medications: Allergies as of 12/19/2017      Reactions   Penicillins Hives, Other (See Comments)   syncope   Tylox [oxycodone-acetaminophen] Other (See Comments)   "messes me up"   Codeine Nausea Only      Medication List        Accurate as of 12/19/17  2:31 PM. Always use your most recent med list.          ADULT ASPIRIN LOW STRENGTH 81 MG EC tablet Generic drug:  Aspirin Take 81 mg by mouth every evening.   diltiazem 240 MG 24 hr capsule Commonly known as:  TIAZAC TAKE 1 CAPSULE BY MOUTH EVERY DAY   GLUCOSAMINE PO Take 1 tablet by mouth every evening.   isosorbide mononitrate 60 MG 24 hr tablet Commonly known as:  IMDUR Take 1 tablet (60 mg total) by mouth daily.   isosorbide mononitrate 60 MG 24 hr tablet Commonly known as:  IMDUR TAKE 1 AND 1/2 TABLETS(90 MG) BY MOUTH DAILY   nitroGLYCERIN 0.4 MG SL tablet Commonly known as:  NITROSTAT  PLACE 1 TABLET (0.4 MG TOTAL) UNDER THE TONGUE EVERY 5 (FIVE) MINUTES AS NEEDED FOR CHEST PAIN.   omeprazole 20 MG capsule Commonly known as:  PRILOSEC TAKE 1 CAPSULE BY MOUTH TWICE DAILY   potassium citrate 10 MEQ (1080 MG) SR tablet Commonly known as:  UROCIT-K TAKE 2 TABLETS BY MOUTH EVERY DAY   rosuvastatin 40 MG tablet Commonly known as:  CRESTOR TAKE 1 TABLET BY MOUTH EVERY DAY

## 2018-02-22 ENCOUNTER — Other Ambulatory Visit: Payer: Self-pay | Admitting: Cardiology

## 2018-02-23 NOTE — Telephone Encounter (Signed)
Rx(s) sent to pharmacy electronically.  

## 2018-03-01 ENCOUNTER — Other Ambulatory Visit: Payer: Self-pay | Admitting: Cardiology

## 2018-03-01 DIAGNOSIS — R072 Precordial pain: Secondary | ICD-10-CM

## 2018-03-02 NOTE — Telephone Encounter (Signed)
Rx sent to pharmacy   

## 2018-03-20 NOTE — Progress Notes (Signed)
HPI: FU coronary artery disease. The patient underwent cardiac catheterization in December 2005 secondary to a non-ST elevation myocardial infarction. At that time, his ejection fraction was 60%. He had successful PCI of his mid LAD with a drug-eluting stent. Note, there was jailing of 2 small diagonals by the LAD stent. He underwent cardiac catheterization in January of 2011 following MI. This revealed an ejection fraction of 55-60%. The stent in the LAD was patent. The stent jails 2 diagonal branches, both diagonals have 90% ostial stenoses. There was a 25-30% circumflex. The right coronary artery had a 25-30% lesion. There was intense vasospasm and it was felt that his infarct was related to spasm. Abdominal ultrasound 2/17 showed no aneurysm. Nuclear study 10/17 showed EF 52 with septal infarct and mild apical ischemia; treated medically. Since I last saw him, the patient denies any dyspnea on exertion, orthopnea, PND, pedal edema, palpitations, syncope or chest pain.   Current Outpatient Medications  Medication Sig Dispense Refill  . Aspirin (ADULT ASPIRIN LOW STRENGTH) 81 MG EC tablet Take 81 mg by mouth every evening.     . diltiazem (TIAZAC) 240 MG 24 hr capsule TAKE 1 CAPSULE BY MOUTH EVERY DAY 90 capsule 0  . Glucosamine HCl (GLUCOSAMINE PO) Take 1 tablet by mouth every evening.     . isosorbide mononitrate (IMDUR) 60 MG 24 hr tablet Take 1 tablet (60 mg total) by mouth daily.    . isosorbide mononitrate (IMDUR) 60 MG 24 hr tablet TAKE 1 AND 1/2 TABLETS(90 MG) BY MOUTH DAILY 135 tablet 0  . nitroGLYCERIN (NITROSTAT) 0.4 MG SL tablet PLACE 1 TABLET (0.4 MG TOTAL) UNDER THE TONGUE EVERY 5 (FIVE) MINUTES AS NEEDED FOR CHEST PAIN. 25 tablet 11  . omeprazole (PRILOSEC) 20 MG capsule TAKE 1 CAPSULE BY MOUTH TWICE DAILY 180 capsule 1  . potassium citrate (UROCIT-K) 10 MEQ (1080 MG) SR tablet Take 2 tablets (20 mEq total) by mouth daily. TAKE 2 TABLETS BY MOUTH EVERY DAY 180 tablet 0  .  rosuvastatin (CRESTOR) 40 MG tablet Take 1 tablet (40 mg total) by mouth daily. 90 tablet 0   Current Facility-Administered Medications  Medication Dose Route Frequency Provider Last Rate Last Dose  . 0.9 %  sodium chloride infusion  500 mL Intravenous Continuous Irene Shipper, MD         Past Medical History:  Diagnosis Date  . Arthritis 1968   pt wears a lt bk prosthesis  . Bronchitis, chronic ([place])   . CAD (coronary artery disease)    Non-ST segment elevation myocardial infarction in 2005 with drug-eluting stent to the left anterior descending. 08-2009 - non-ST segment elevation myocardial infarction, felt secondary to vasospasm.  . Calculus, kidney    hx  . Complication of anesthesia    itching after preop med  ?name - 2013, no SOB  . Glaucoma   . Hyperlipidemia   . Hypertension   . Myocardial infarction [facility]) 2005   x2, 2005, 2010  . OSA (obstructive sleep apnea)    AHI 68/hr in 2002-uses a cpap  . Pneumonia    hx of   . Sleep apnea    wears CPAP  . Wears glasses     Past Surgical History:  Procedure Laterality Date  . 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 CARDIAC CATHETERIZATION    . CYST REMOVAL TRUNK  09/04/2012   Procedure: CYST REMOVAL TRUNK;  Surgeon: Ralene Ok, MD;  Location: [employer] [facility];  Service: General;  Laterality: N/A;  EXCISION OF MID BACK MASS  . EYE SURGERY Left DR.DeRidder   CATARACT SX1/2019  . INCISION AND DRAINAGE ABSCESS Left 07/31/2013   Procedure: INCISIONAL/NON-INCISIONAL DEBRIDEMENT OF ABSCESS LEFT KNEE;  Surgeon: Mauri Pole, MD;  Location: [facility];  Service: Orthopedics;  Laterality: Left;  . KIDNEY STONE SURGERY     multiple episodes   . Right Knee Arthroscopy  2010   [facility]  . TONSILLECTOMY      Social History   Socioeconomic History  . Marital status: Married    Spouse name: Not on file  . Number of children: 2  . Years of education: Not on file  . Highest education level: Not on file    Occupational History  . Occupation: Therapist, sports-- [employer]  Social Needs  . Financial resource strain: Not on file  . Food insecurity:    Worry: Not on file    Inability: Not on file  . Transportation needs:    Medical: Not on file    Non-medical: Not on file  Tobacco Use  . Smoking status: Former Smoker    Packs/day: 0.75    Years: 20.00    Pack years: 15.00    Types: Cigarettes  . Smokeless tobacco: Never Used  . Tobacco comment: currently doing some e-cigarrets (started at age 40. less than 1 ppd.)   Substance and Sexual Activity  . Alcohol use: Yes    Comment: bourbon 3 x/week.  . Drug use: No  . Sexual activity: Never  Lifestyle  . Physical activity:    Days per week: Not on file    Minutes per session: Not on file  . Stress: Not on file  Relationships  . Social connections:    Talks on phone: Not on file    Gets together: Not on file    Attends religious service: Not on file    Active member of club or organization: Not on file    Attends meetings of clubs or organizations: Not on file    Relationship status: Not on file  . Intimate partner violence:    Fear of current or ex partner: Not on file    Emotionally abused: Not on file    Physically abused: Not on file    Forced sexual activity: Not on file  Other Topics Concern  . Not on file  Social History Narrative   Lives w/ wife     Family History  Problem Relation Age of Onset  . Heart attack Father   . Heart attack Paternal Grandfather   . Cancer Mother        "male ca"  . Colon cancer Neg Hx   . Prostate cancer Neg Hx   . Diabetes Neg Hx   . Esophageal cancer Neg Hx   . Rectal cancer Neg Hx   . Stomach cancer Neg Hx     ROS: no fevers or chills, productive cough, hemoptysis, dysphasia, odynophagia, melena, hematochezia, dysuria, hematuria, rash, seizure activity, orthopnea, PND, pedal edema, claudication. Remaining systems are negative.  Physical Exam: Well-developed well-nourished  in no acute distress.  Skin is warm and dry.  HEENT is normal.  Neck is supple.  Chest is clear to auscultation with normal expansion.  Cardiovascular exam is regular rate and rhythm.  Abdominal exam nontender or distended. No masses palpated. Extremities show no edema. L AKA neuro grossly intact  A/P  1 coronary artery disease-patient doing  well with no significant chest pain.  Plan to continue medical therapy with aspirin, statin and nitrates.  2 hypertension-patient's blood pressure is controlled.  Continue present medications.  3 hyperlipidemia-continue statin. Lipids and liver monitored by primary care.  Kirk Ruths, MD

## 2018-03-24 ENCOUNTER — Ambulatory Visit: Payer: PPO | Admitting: Cardiology

## 2018-03-24 ENCOUNTER — Encounter: Payer: Self-pay | Admitting: Cardiology

## 2018-03-24 VITALS — BP 140/66 | HR 58 | Ht 65.0 in | Wt 199.2 lb

## 2018-03-24 DIAGNOSIS — I10 Essential (primary) hypertension: Secondary | ICD-10-CM | POA: Diagnosis not present

## 2018-03-24 DIAGNOSIS — E7800 Pure hypercholesterolemia, unspecified: Secondary | ICD-10-CM | POA: Diagnosis not present

## 2018-03-24 DIAGNOSIS — I2510 Atherosclerotic heart disease of native coronary artery without angina pectoris: Secondary | ICD-10-CM | POA: Diagnosis not present

## 2018-03-24 NOTE — Patient Instructions (Signed)
Your physician wants you to follow-up in: ONE YEAR WITH DR CRENSHAW You will receive a reminder letter in the mail two months in advance. If you don't receive a letter, please call our office to schedule the follow-up appointment.   If you need a refill on your cardiac medications before your next appointment, please call your pharmacy.  

## 2018-03-26 ENCOUNTER — Encounter: Payer: Self-pay | Admitting: Internal Medicine

## 2018-03-26 ENCOUNTER — Ambulatory Visit (INDEPENDENT_AMBULATORY_CARE_PROVIDER_SITE_OTHER): Payer: PPO | Admitting: Internal Medicine

## 2018-03-26 VITALS — BP 126/54 | HR 58 | Temp 98.0°F | Resp 16 | Ht 65.0 in | Wt 199.4 lb

## 2018-03-26 DIAGNOSIS — E785 Hyperlipidemia, unspecified: Secondary | ICD-10-CM | POA: Diagnosis not present

## 2018-03-26 DIAGNOSIS — Z09 Encounter for follow-up examination after completed treatment for conditions other than malignant neoplasm: Secondary | ICD-10-CM | POA: Diagnosis not present

## 2018-03-26 DIAGNOSIS — I10 Essential (primary) hypertension: Secondary | ICD-10-CM | POA: Diagnosis not present

## 2018-03-26 LAB — BASIC METABOLIC PANEL
BUN: 16 mg/dL (ref 6–23)
CHLORIDE: 104 meq/L (ref 96–112)
CO2: 30 meq/L (ref 19–32)
CREATININE: 0.83 mg/dL (ref 0.40–1.50)
Calcium: 9 mg/dL (ref 8.4–10.5)
GFR: 97.25 mL/min (ref >60.00)
Glucose, Bld: 115 mg/dL — ABNORMAL HIGH (ref 70–99)
POTASSIUM: 4.2 meq/L (ref 3.5–5.1)
Sodium: 139 mEq/L (ref 135–145)

## 2018-03-26 NOTE — Progress Notes (Addendum)
Subjective:    Patient ID: Devin Martin, male    DOB: 09-Apr-1946, 72 y.o.   MRN: [id number]  DOS:  03/26/2018 Type of visit - description : f/u Interval history:  No major concerns, good compliance with medicines. Does need prescription for a new liner.  Review of Systems No difficulty breathing No nausea, vomiting, diarrhea  Past Medical History:  Diagnosis Date  . Arthritis 1968   pt wears a lt bk prosthesis  . Bronchitis, chronic ([place])   . CAD (coronary artery disease)    Non-ST segment elevation myocardial infarction in 2005 with drug-eluting stent to the left anterior descending. 08-2009 - non-ST segment elevation myocardial infarction, felt secondary to vasospasm.  . Calculus, kidney    hx  . Complication of anesthesia    itching after preop med  ?name - 2013, no SOB  . Glaucoma   . Hyperlipidemia   . Hypertension   . Myocardial infarction [facility]) 2005   x2, 2005, 2010  . OSA (obstructive sleep apnea)    AHI 68/hr in 2002-uses a cpap  . Pneumonia    hx of   . Sleep apnea    wears CPAP  . Wears glasses     Past Surgical History:  Procedure Laterality Date  . 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 CARDIAC CATHETERIZATION    . CYST REMOVAL TRUNK  09/04/2012   Procedure: CYST REMOVAL TRUNK;  Surgeon: Ralene Ok, MD;  Location: [place];  Service: General;  Laterality: N/A;  EXCISION OF MID BACK MASS  . EYE SURGERY Left DR.Warner   CATARACT SX1/2019  . INCISION AND DRAINAGE ABSCESS Left 07/31/2013   Procedure: INCISIONAL/NON-INCISIONAL DEBRIDEMENT OF ABSCESS LEFT KNEE;  Surgeon: Mauri Pole, MD;  Location: [facility];  Service: Orthopedics;  Laterality: Left;  . KIDNEY STONE SURGERY     multiple episodes   . Right Knee Arthroscopy  2010   [facility]  . TONSILLECTOMY      Social History   Socioeconomic History  . Marital status: Married    Spouse name: Not on file  . Number of children: 2  . Years of education: Not on file  .  Highest education level: Not on file  Occupational History  . Occupation: Therapist, sports-- [employer]  Social Needs  . Financial resource strain: Not on file  . Food insecurity:    Worry: Not on file    Inability: Not on file  . Transportation needs:    Medical: Not on file    Non-medical: Not on file  Tobacco Use  . Smoking status: Former Smoker    Packs/day: 0.75    Years: 20.00    Pack years: 15.00    Types: Cigarettes  . Smokeless tobacco: Never Used  . Tobacco comment: currently doing some e-cigarrets (started at age 56. less than 1 ppd.)   Substance and Sexual Activity  . Alcohol use: Yes    Comment: bourbon 3 x/week.  . Drug use: No  . Sexual activity: Not Currently  Lifestyle  . Physical activity:    Days per week: Not on file    Minutes per session: Not on file  . Stress: Not on file  Relationships  . Social connections:    Talks on phone: Not on file    Gets together: Not on file    Attends religious service: Not on file    Active member of club or organization: Not on file  Attends meetings of clubs or organizations: Not on file    Relationship status: Not on file  . Intimate partner violence:    Fear of current or ex partner: Not on file    Emotionally abused: Not on file    Physically abused: Not on file    Forced sexual activity: Not on file  Other Topics Concern  . Not on file  Social History Narrative   Lives w/ wife       Allergies as of 03/26/2018      Reactions   Penicillins Hives, Other (See Comments)   syncope   Tylox [oxycodone-acetaminophen] Other (See Comments)   "messes me up"   Codeine Nausea Only      Medication List        Accurate as of 03/26/18 11:59 PM. Always use your most recent med list.          ADULT ASPIRIN LOW STRENGTH 81 MG EC tablet Generic drug:  Aspirin Take 81 mg by mouth every evening.   diltiazem 240 MG 24 hr capsule Commonly known as:  TIAZAC TAKE 1 CAPSULE BY MOUTH EVERY DAY   GLUCOSAMINE  PO Take 1 tablet by mouth every evening.   isosorbide mononitrate 60 MG 24 hr tablet Commonly known as:  IMDUR Take 1 tablet (60 mg total) by mouth daily.   isosorbide mononitrate 60 MG 24 hr tablet Commonly known as:  IMDUR TAKE 1 AND 1/2 TABLETS(90 MG) BY MOUTH DAILY   nitroGLYCERIN 0.4 MG SL tablet Commonly known as:  NITROSTAT PLACE 1 TABLET (0.4 MG TOTAL) UNDER THE TONGUE EVERY 5 (FIVE) MINUTES AS NEEDED FOR CHEST PAIN.   omeprazole 20 MG capsule Commonly known as:  PRILOSEC TAKE 1 CAPSULE BY MOUTH TWICE DAILY   potassium citrate 10 MEQ (1080 MG) SR tablet Commonly known as:  UROCIT-K Take 2 tablets (20 mEq total) by mouth daily. TAKE 2 TABLETS BY MOUTH EVERY DAY   rosuvastatin 40 MG tablet Commonly known as:  CRESTOR Take 1 tablet (40 mg total) by mouth daily.          Objective:   Physical Exam BP (!) 126/54 (BP Location: Right Arm, Patient Position: Sitting, Cuff Size: Small)   Pulse (!) 58   Temp 98 F (36.7 C) (Oral)   Resp 16   Ht 5\' 5"  (1.651 m)   Wt 199 lb 6.4 oz (90.4 kg)   SpO2 98%   BMI 33.18 kg/m  General:   Well developed, NAD, see BMI.  HEENT:  Normocephalic . Face symmetric, atraumatic Lungs:  CTA B Normal respiratory effort, no intercostal retractions, no accessory muscle use. Heart: RRR,  no murmur.  No pretibial edema R leg Skin: Not pale. Not jaundice Neurologic:  alert & oriented X3.  Speech normal, gait appropriate for a L leg amputee Psych--  Cognition and judgment appear intact.  Cooperative with normal attention span and concentration.  Behavior appropriate. No anxious or depressed appearing.      Assessment & Plan:   Assessment Hyperglycemia: A1c 5.01 February 2016 HTN Hyperlipidemia GERD , sporadic sx  Obesity  OSA, + CPAP CAD: NSTEMI 2005, stent. 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  Patient frustrated because the lack of weight loss despite eating healthy although admits to issues with portion control.  Recommend calorie counting, information about the weight management clinic provided. RTC 6 months CPX

## 2018-03-26 NOTE — Patient Instructions (Signed)
GO TO THE LAB : Get the blood work     GO TO THE FRONT DESK Schedule your next appointment for a  Physical exam in 6 months, fasting    Check the  blood pressure 2 or 3 times a month   Be sure your blood pressure is between 110/65 and  135/85. If it is consistently higher or lower, let me know

## 2018-03-27 NOTE — Assessment & Plan Note (Addendum)
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  Patient frustrated because the lack of weight loss despite eating healthy although admits to issues with portion control.  Recommend calorie counting, information about the weight management clinic provided. RTC 6 months CPX

## 2018-04-02 ENCOUNTER — Encounter: Payer: Self-pay | Admitting: Internal Medicine

## 2018-04-02 NOTE — Telephone Encounter (Signed)
Already answered this question via another MyChart message.

## 2018-04-06 ENCOUNTER — Telehealth: Payer: Self-pay | Admitting: *Deleted

## 2018-04-06 NOTE — Telephone Encounter (Signed)
Received Physician Orders from [facility]; will forward to provider upon RTO Mon, 04/15/18//SLS 08/12

## 2018-04-14 NOTE — Telephone Encounter (Signed)
Form signed on PCP return to office and faxed to [facility] at [phone]. Form sent for scanning.

## 2018-04-16 NOTE — Telephone Encounter (Signed)
Previous office visit note does not have information they are needing regarding documentation that he needs new prosthetic leg.

## 2018-04-16 NOTE — Telephone Encounter (Signed)
OV notes have been refaxed.

## 2018-04-16 NOTE — Telephone Encounter (Signed)
His visit note has been addended, please fax it again, that should be enough.

## 2018-04-16 NOTE — Telephone Encounter (Signed)
[facility] w/[facility] [phone] said they received the form but they didn't receive the medical records that was requested.  She wanted the records that expressed need for the patient's new liner for his prostatic leg. Please fax to [phone].

## 2018-05-01 DIAGNOSIS — Z89512 Acquired absence of left leg below knee: Secondary | ICD-10-CM | POA: Diagnosis not present

## 2018-05-01 DIAGNOSIS — G4733 Obstructive sleep apnea (adult) (pediatric): Secondary | ICD-10-CM | POA: Diagnosis not present

## 2018-05-07 ENCOUNTER — Other Ambulatory Visit: Payer: Self-pay | Admitting: Cardiology

## 2018-05-21 DIAGNOSIS — J209 Acute bronchitis, unspecified: Secondary | ICD-10-CM | POA: Diagnosis not present

## 2018-05-28 DIAGNOSIS — J209 Acute bronchitis, unspecified: Secondary | ICD-10-CM | POA: Diagnosis not present

## 2018-06-02 ENCOUNTER — Other Ambulatory Visit: Payer: Self-pay | Admitting: Cardiology

## 2018-06-02 DIAGNOSIS — R072 Precordial pain: Secondary | ICD-10-CM

## 2018-07-03 DIAGNOSIS — I10 Essential (primary) hypertension: Secondary | ICD-10-CM | POA: Diagnosis not present

## 2018-07-03 DIAGNOSIS — Z961 Presence of intraocular lens: Secondary | ICD-10-CM | POA: Diagnosis not present

## 2018-07-03 DIAGNOSIS — H25011 Cortical age-related cataract, right eye: Secondary | ICD-10-CM | POA: Diagnosis not present

## 2018-07-03 DIAGNOSIS — H2511 Age-related nuclear cataract, right eye: Secondary | ICD-10-CM | POA: Diagnosis not present

## 2018-07-19 ENCOUNTER — Encounter (HOSPITAL_BASED_OUTPATIENT_CLINIC_OR_DEPARTMENT_OTHER): Payer: Self-pay | Admitting: Emergency Medicine

## 2018-07-19 ENCOUNTER — Other Ambulatory Visit: Payer: Self-pay

## 2018-07-19 ENCOUNTER — Emergency Department (HOSPITAL_BASED_OUTPATIENT_CLINIC_OR_DEPARTMENT_OTHER)
Admission: EM | Admit: 2018-07-19 | Discharge: 2018-07-19 | Disposition: A | Discharge status: Home / Self Care | Payer: PPO | Attending: Emergency Medicine | Admitting: Emergency Medicine

## 2018-07-19 DIAGNOSIS — Y999 Unspecified external cause status: Secondary | ICD-10-CM | POA: Insufficient documentation

## 2018-07-19 DIAGNOSIS — S199XXA Unspecified injury of neck, initial encounter: Secondary | ICD-10-CM | POA: Diagnosis present

## 2018-07-19 DIAGNOSIS — Z79899 Other long term (current) drug therapy: Secondary | ICD-10-CM | POA: Diagnosis not present

## 2018-07-19 DIAGNOSIS — Z89512 Acquired absence of left leg below knee: Secondary | ICD-10-CM | POA: Insufficient documentation

## 2018-07-19 DIAGNOSIS — Z955 Presence of coronary angioplasty implant and graft: Secondary | ICD-10-CM | POA: Diagnosis not present

## 2018-07-19 DIAGNOSIS — Z7982 Long term (current) use of aspirin: Secondary | ICD-10-CM | POA: Diagnosis not present

## 2018-07-19 DIAGNOSIS — F17290 Nicotine dependence, other tobacco product, uncomplicated: Secondary | ICD-10-CM | POA: Diagnosis not present

## 2018-07-19 DIAGNOSIS — I10 Essential (primary) hypertension: Secondary | ICD-10-CM | POA: Insufficient documentation

## 2018-07-19 DIAGNOSIS — Y929 Unspecified place or not applicable: Secondary | ICD-10-CM | POA: Insufficient documentation

## 2018-07-19 DIAGNOSIS — Y939 Activity, unspecified: Secondary | ICD-10-CM | POA: Diagnosis not present

## 2018-07-19 DIAGNOSIS — X58XXXA Exposure to other specified factors, initial encounter: Secondary | ICD-10-CM | POA: Diagnosis not present

## 2018-07-19 DIAGNOSIS — I2510 Atherosclerotic heart disease of native coronary artery without angina pectoris: Secondary | ICD-10-CM | POA: Diagnosis not present

## 2018-07-19 DIAGNOSIS — S161XXA Strain of muscle, fascia and tendon at neck level, initial encounter: Secondary | ICD-10-CM | POA: Diagnosis not present

## 2018-07-19 MED ORDER — DIAZEPAM 5 MG/ML IJ SOLN
5.0000 mg | Freq: Once | INTRAMUSCULAR | Status: AC
  Administered 2018-07-19: 5 mg via INTRAMUSCULAR
  Filled 2018-07-19: qty 2

## 2018-07-19 MED ORDER — LIDOCAINE 1.8 % EX PTCH: 1.0000 | MEDICATED_PATCH | Freq: Every day | CUTANEOUS | 0 refills | Status: DC

## 2018-07-19 MED ORDER — LIDOCAINE 5 % EX PTCH
1.0000 | MEDICATED_PATCH | CUTANEOUS | Status: DC
  Filled 2018-07-19: qty 1

## 2018-07-19 MED ORDER — CYCLOBENZAPRINE HCL 10 MG PO TABS: 10.0000 mg | ORAL_TABLET | Freq: Two times a day (BID) | ORAL | 0 refills | Status: DC | PRN

## 2018-07-19 MED ORDER — KETOROLAC TROMETHAMINE 60 MG/2ML IM SOLN
60.0000 mg | Freq: Once | INTRAMUSCULAR | Status: AC
  Administered 2018-07-19: 60 mg via INTRAMUSCULAR
  Filled 2018-07-19: qty 2

## 2018-07-19 NOTE — ED Provider Notes (Signed)
[place] EMERGENCY DEPARTMENT Provider Note   CSN: [id number] Arrival date & time: 07/19/18  0813     History   Chief Complaint Chief Complaint  Patient presents with  . Neck pain    HPI Devin Martin is a 72 y.o. male.  HPI   Thursday played golf, Friday AM developed crick in neck and ever since then it has been worsening every day. Severe neck pain, stiffness. Work out every morning but hasn't since this happened. Progressively getting worse and leaving tomorrow to go to beach.  No trauma or falls. No fevers. No chest or dyspnea, no abdominal pain.  Feels like cramping sharp pain, right side of neck, afraid to move neck, no radiation of pain.   Not sure how he could have injured it.  No numbness or weakness.  No hx of similar.   Has not taken any medications so far.   Past Medical History:  Diagnosis Date  . Arthritis 1968   pt wears a lt bk prosthesis  . Bronchitis, chronic ([place])   . CAD (coronary artery disease)    Non-ST segment elevation myocardial infarction in 2005 with drug-eluting stent to the left anterior descending. 08-2009 - non-ST segment elevation myocardial infarction, felt secondary to vasospasm.  . Calculus, kidney    hx  . Complication of anesthesia    itching after preop med  ?name - 2013, no SOB  . Glaucoma   . Hyperlipidemia   . Hypertension   . Myocardial infarction [facility]) 2005   x2, 2005, 2010  . OSA (obstructive sleep apnea)    AHI 68/hr in 2002-uses a cpap  . Pneumonia    hx of   . Sleep apnea    wears CPAP  . Wears glasses     Patient Active Problem List   Diagnosis Date Noted  . Cholecystitis 01/16/2017  . Abdominal pain, epigastric 11/01/2016  . S/P BKA (below knee amputation) ([place]) 05/06/2016  . PCP NOTES >>>>>>>>>>>>>>>>>>> 02/07/2016  . DJD (degenerative joint disease) 12/31/2013  . Annual physical exam 08/15/2011  . Hypogonadism male 08/12/2011  . GERD 04/05/2010  . Chest pain 03/07/2010  . ANXIETY  10/13/2009  . DYSPNEA 10/13/2009  . ABDOMINAL BRUIT 01/27/2009  . Essential hypertension 01/26/2009  . Hyperlipidemia 06/23/2007  . Obstructive sleep apnea 06/23/2007  . Coronary atherosclerosis 06/23/2007  . CALCULUS, KIDNEY 06/17/2007    Past Surgical History:  Procedure Laterality Date  . 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 CARDIAC CATHETERIZATION    . CYST REMOVAL TRUNK  09/04/2012   Procedure: CYST REMOVAL TRUNK;  Surgeon: Ralene Ok, MD;  Location: [place];  Service: General;  Laterality: N/A;  EXCISION OF MID BACK MASS  . EYE SURGERY Left DR.[place]   CATARACT SX1/2019  . INCISION AND DRAINAGE ABSCESS Left 07/31/2013   Procedure: INCISIONAL/NON-INCISIONAL DEBRIDEMENT OF ABSCESS LEFT KNEE;  Surgeon: Mauri Pole, MD;  Location: [facility];  Service: Orthopedics;  Laterality: Left;  . KIDNEY STONE SURGERY     multiple episodes   . Right Knee Arthroscopy  2010   [facility]  . TONSILLECTOMY          Home Medications    Prior to Admission medications   Medication Sig Start Date End Date Taking? Authorizing Provider  Aspirin (ADULT ASPIRIN LOW STRENGTH) 81 MG EC tablet Take 81 mg by mouth every evening.    Yes [provider]  diltiazem (TIAZAC) 240 MG 24 hr  capsule TAKE 1 CAPSULE BY MOUTH EVERY DAY 06/03/18  Yes Crenshaw, Denice Bors, MD  Glucosamine HCl (GLUCOSAMINE PO) Take 1 tablet by mouth every evening.    Yes [provider]  isosorbide mononitrate (IMDUR) 60 MG 24 hr tablet Take 1 tablet (60 mg total) by mouth daily. 01/16/17  Yes Paz, [name], MD  omeprazole (PRILOSEC) 20 MG capsule TAKE 1 CAPSULE BY MOUTH TWICE DAILY 11/25/17  Yes Irene Shipper, MD  potassium citrate (UROCIT-K) 10 MEQ (1080 MG) SR tablet TAKE 2 TABLETS BY MOUTH EVERY DAY 05/08/18  Yes Lelon Perla, MD  rosuvastatin (CRESTOR) 40 MG tablet TAKE 1 TABLET(40 MG) BY MOUTH DAILY 06/03/18  Yes Crenshaw, Denice Bors, MD  cyclobenzaprine (FLEXERIL) 10 MG tablet Take 1  tablet (10 mg total) by mouth 2 (two) times daily as needed for muscle spasms. 07/19/18   Gareth Morgan, MD  isosorbide mononitrate (IMDUR) 60 MG 24 hr tablet TAKE 1 AND 1/2 TABLETS(90 MG) BY MOUTH DAILY 06/03/18   Lelon Perla, MD  Lidocaine (ZTLIDO) 1.8 % PTCH Apply 1 patch topically daily. 07/19/18   Gareth Morgan, MD  nitroGLYCERIN (NITROSTAT) 0.4 MG SL tablet PLACE 1 TABLET (0.4 MG TOTAL) UNDER THE TONGUE EVERY 5 (FIVE) MINUTES AS NEEDED FOR CHEST PAIN. 06/09/15   Lelon Perla, MD    Family History Family History  Problem Relation Age of Onset  . Heart attack Father   . Heart attack Paternal Grandfather   . Cancer Mother        "male ca"  . Colon cancer Neg Hx   . Prostate cancer Neg Hx   . Diabetes Neg Hx   . Esophageal cancer Neg Hx   . Rectal cancer Neg Hx   . Stomach cancer Neg Hx     Social History Social History   Tobacco Use  . Smoking status: Former Smoker    Packs/day: 0.75    Years: 20.00    Pack years: 15.00    Types: Cigarettes  . Smokeless tobacco: Never Used  . Tobacco comment: currently doing some e-cigarrets (started at age 66. less than 1 ppd.)   Substance Use Topics  . Alcohol use: Yes    Comment: bourbon 3 x/week.  . Drug use: No     Allergies   Penicillins; Tylox [oxycodone-acetaminophen]; and Codeine   Review of Systems Review of Systems  Constitutional: Negative for fever.  HENT: Negative for sore throat.   Eyes: Negative for visual disturbance.  Respiratory: Negative for shortness of breath.   Cardiovascular: Negative for chest pain.  Gastrointestinal: Negative for abdominal pain, nausea and vomiting.  Genitourinary: Negative for difficulty urinating.  Musculoskeletal: Positive for neck pain and neck stiffness. Negative for back pain.  Skin: Negative for rash.  Neurological: Negative for syncope, weakness, numbness and headaches.     Physical Exam Updated Vital Signs BP (!) 141/63 (BP Location: Right Arm)    Pulse 62   Temp 98.3 F (36.8 C) (Oral)   Resp 18   Ht 5\' 5"  (1.651 m)   Wt 86.2 kg   SpO2 99%   BMI 31.62 kg/m   Physical Exam  Constitutional: He is oriented to person, place, and time. He appears well-developed and well-nourished. No distress.  HENT:  Head: Normocephalic and atraumatic.  Eyes: Conjunctivae and EOM are normal.  Neck: Normal range of motion. Muscular tenderness present.  Cardiovascular: Normal rate, regular rhythm, normal heart sounds and intact distal pulses. Exam reveals no gallop and no  friction rub.  No murmur heard. Pulmonary/Chest: Effort normal and breath sounds normal. No respiratory distress. He has no wheezes. He has no rales.  Abdominal: Soft. He exhibits no distension. There is no tenderness. There is no guarding.  Musculoskeletal: He exhibits no edema.  Neurological: He is alert and oriented to person, place, and time. He has normal strength. No sensory deficit.  Skin: Skin is warm and dry. He is not diaphoretic.  Nursing note and vitals reviewed.    ED Treatments / Results  Labs (all labs ordered are listed, but only abnormal results are displayed) Labs Reviewed - No data to display  EKG None  Radiology No results found.  Procedures Procedures (including critical care time)  Medications Ordered in ED Medications  ketorolac (TORADOL) injection 60 mg (60 mg Intramuscular Given 07/19/18 0944)  diazepam (VALIUM) injection 5 mg (5 mg Intramuscular Given 07/19/18 0944)     Initial Impression / Assessment and Plan / ED Course  I have reviewed the triage vital signs and the nursing notes.  Pertinent labs & imaging results that were available during my care of the patient were reviewed by me and considered in my medical decision making (see chart for details).     72yo male presents with concern for right sided neck pain. No trauma or falls, doubt fracture. No weakness or numbness on exam, doubt acute cord compression.  No fever, doubt  epidural abscess or septic arthritis.  No sign of thoracic or intraabdominal etiology of pain.  No sign of acute arterial or venous occclusion.  Suspect most likely etiology of severe neck pain worse with movements, spasm on exam, is cervical strain with spasm.  Recommend following up with PCP.  Given rx for flexeril and lidocaine, rec tylenol and ibuprofen. Patient discharged in stable condition with understanding of reasons to return.   Final Clinical Impressions(s) / ED Diagnoses   Final diagnoses:  Strain of neck muscle, initial encounter    ED Discharge Orders         Ordered    cyclobenzaprine (FLEXERIL) 10 MG tablet  2 times daily PRN     07/19/18 0935    Lidocaine (ZTLIDO) 1.8 % PTCH  Daily     07/19/18 6433           Gareth Morgan, MD 07/19/18 [phone]

## 2018-07-19 NOTE — ED Notes (Signed)
Aromatherapy trial pain trial initiated.

## 2018-07-19 NOTE — ED Triage Notes (Signed)
Neck pain x 2 days, denies injury. Hurts worse with movement, especially toward the right.

## 2018-07-20 ENCOUNTER — Telehealth: Payer: Self-pay

## 2018-07-20 NOTE — Telephone Encounter (Signed)
Needs ED f/u- please schedule.

## 2018-07-20 NOTE — Telephone Encounter (Signed)
Called and spoke to pt. Pt stated that he has a severe crick in his neck. Pt stated that he is out of town until Monday and would call and schedule an appt if he is not feeling any better.

## 2018-07-20 NOTE — Telephone Encounter (Signed)
Noted! Thank you

## 2018-08-27 DIAGNOSIS — H2511 Age-related nuclear cataract, right eye: Secondary | ICD-10-CM | POA: Diagnosis not present

## 2018-09-28 ENCOUNTER — Ambulatory Visit: Payer: PPO | Admitting: *Deleted

## 2018-10-01 ENCOUNTER — Ambulatory Visit: Payer: PPO | Admitting: *Deleted

## 2018-10-02 ENCOUNTER — Ambulatory Visit: Payer: PPO | Admitting: *Deleted

## 2018-10-02 ENCOUNTER — Ambulatory Visit: Payer: PPO | Admitting: Internal Medicine

## 2018-10-08 NOTE — Progress Notes (Addendum)
Subjective:   CAROLS CLEMENCE is a 73 y.o. male who presents for Medicare Annual/Subsequent preventive examination.  Review of Systems: No ROS.  Medicare Wellness Visit. Additional risk factors are reflected in the social history.   Sleep patterns: only sleeps 5-6 hrs as needed. Wears CPAP. Home Safety/Smoke Alarms: Feels safe in home. Smoke alarms in place.  Lives with wife. Easily navigates stairs.  Male:   CCS- last 09/08/14. Recall 10 yrs    PSA-  Lab Results  Component Value Date   PSA 0.90 02/06/2016   PSA 0.75 01/29/2013   PSA 0.71 08/13/2011       Objective:    Vitals: BP 124/66 (BP Location: Left Arm, Patient Position: Sitting, Cuff Size: Large)   Pulse 60   Ht 5\' 5"  (1.651 m)   Wt 203 lb 12.8 oz (92.4 kg)   SpO2 97%   BMI 33.91 kg/m   Body mass index is 33.91 kg/m.  Advanced Directives 10/09/2018 07/19/2018 11/25/2016 09/24/2016 02/21/2016 09/22/2015 08/30/2014  Does Patient Have a Medical Advance Directive? Yes No Yes Yes No Yes Yes  Type of Paramedic of [place];Living will - - [place];Living will - [place];Living will [place]  Does patient want to make changes to medical advance directive? No - Patient declined - - - - No - Patient declined -  Copy of [place] in Chart? Yes - validated most recent copy scanned in chart (See row information) - - No - copy requested - No - copy requested -  Pre-existing out of facility DNR order (yellow form or pink MOST form) - - - - - - -    Tobacco Social History   Tobacco Use  Smoking Status Former Smoker  . Packs/day: 0.75  . Years: 20.00  . Pack years: 15.00  . Types: Cigarettes  Smokeless Tobacco Never Used  Tobacco Comment   currently doing some e-cigarrets (started at age 57. less than 1 ppd.)      Counseling given: Not Answered Comment: currently doing some e-cigarrets (started at age 34. less than 1 ppd.)      Clinical Intake: Pain : No/denies pain     Past Medical History:  Diagnosis Date  . Arthritis 1968   pt wears a lt bk prosthesis  . Bronchitis, chronic ([place])   . CAD (coronary artery disease)    Non-ST segment elevation myocardial infarction in 2005 with drug-eluting stent to the left anterior descending. 08-2009 - non-ST segment elevation myocardial infarction, felt secondary to vasospasm.  . Calculus, kidney    hx  . Complication of anesthesia    itching after preop med  ?name - 2013, no SOB  . Glaucoma   . Hyperlipidemia   . Hypertension   . Myocardial infarction [facility]) 2005   x2, 2005, 2010  . OSA (obstructive sleep apnea)    AHI 68/hr in 2002-uses a cpap  . Pneumonia    hx of   . Sleep apnea    wears CPAP  . Wears glasses    Past Surgical History:  Procedure Laterality Date  . 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 CARDIAC CATHETERIZATION    . CYST REMOVAL TRUNK  09/04/2012   Procedure: CYST REMOVAL TRUNK;  Surgeon: Ralene Ok, MD;  Location: [place];  Service: General;  Laterality: N/A;  EXCISION OF MID BACK MASS  . EYE SURGERY Left DR.[place]  CATARACT SX1/2019  . INCISION AND DRAINAGE ABSCESS Left 07/31/2013   Procedure: INCISIONAL/NON-INCISIONAL DEBRIDEMENT OF ABSCESS LEFT KNEE;  Surgeon: Mauri Pole, MD;  [facility];  Service: Orthopedics;  Laterality: Left;  . KIDNEY STONE SURGERY     multiple episodes   . Right Knee Arthroscopy  2010   [place] [facility]  . TONSILLECTOMY     Family History  Problem Relation Age of Onset  . Heart attack Father   . Heart attack Paternal Grandfather   . Cancer Mother        "male ca"  . Colon cancer Neg Hx   . Prostate cancer Neg Hx   . Diabetes Neg Hx   . Esophageal cancer Neg Hx   . Rectal cancer Neg Hx   . Stomach cancer Neg Hx    Social History   Socioeconomic History  . Marital status: Married    Spouse name: Not on file  . Number of children: 2  . Years of education:  Not on file  . Highest education level: Not on file  Occupational History  . 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 Financial resource strain: Not on file  . Food insecurity:    Worry: Not on file    Inability: Not on file  . Transportation needs:    Medical: Not on file    Non-medical: Not on file  Tobacco Use  . Smoking status: Former Smoker    Packs/day: 0.75    Years: 20.00    Pack years: 15.00    Types: Cigarettes  . Smokeless tobacco: Never Used  . Tobacco comment: currently doing some e-cigarrets (started at age 70. less than 1 ppd.)   Substance and Sexual Activity  . Alcohol use: Yes    Comment: bourbon 3 fingers 3 x/week.  . Drug use: No  . Sexual activity: Not Currently  Lifestyle  . Physical activity:    Days per week: Not on file    Minutes per session: Not on file  . Stress: Not on file  Relationships  . Social connections:    Talks on phone: Not on file    Gets together: Not on file    Attends religious service: Not on file    Active member of club or organization: Not on file    Attends meetings of clubs or organizations: Not on file    Relationship status: Not on file  Other Topics Concern  . Not on file  Social History Narrative   Lives w/ wife     Outpatient Encounter Medications as of 10/09/2018  Medication Sig  . Aspirin (ADULT ASPIRIN LOW STRENGTH) 81 MG EC tablet Take 81 mg by mouth every evening.   . diltiazem (TIAZAC) 240 MG 24 hr capsule TAKE 1 CAPSULE BY MOUTH EVERY DAY  . Glucosamine HCl (GLUCOSAMINE PO) Take 1 tablet by mouth every evening.   . isosorbide mononitrate (IMDUR) 60 MG 24 hr tablet Take 1 tablet (60 mg total) by mouth daily.  Marland Kitchen omeprazole (PRILOSEC) 20 MG capsule TAKE 1 CAPSULE BY MOUTH TWICE DAILY  . potassium citrate (UROCIT-K) 10 MEQ (1080 MG) SR tablet TAKE 2 TABLETS BY MOUTH EVERY DAY  . rosuvastatin (CRESTOR) 40 MG tablet TAKE 1 TABLET(40 MG) BY MOUTH DAILY  . nitroGLYCERIN (NITROSTAT) 0.4 MG SL  tablet PLACE 1 TABLET (0.4 MG TOTAL) UNDER THE TONGUE EVERY 5 (FIVE) MINUTES AS NEEDED FOR CHEST PAIN. (Patient not taking: Reported on 10/09/2018)  . [DISCONTINUED] cyclobenzaprine (  FLEXERIL) 10 MG tablet Take 1 tablet (10 mg total) by mouth 2 (two) times daily as needed for muscle spasms.  . [DISCONTINUED] isosorbide mononitrate (IMDUR) 60 MG 24 hr tablet TAKE 1 AND 1/2 TABLETS(90 MG) BY MOUTH DAILY  . [DISCONTINUED] Lidocaine (ZTLIDO) 1.8 % PTCH Apply 1 patch topically daily.   Facility-Administered Encounter Medications as of 10/09/2018  Medication  . 0.9 %  sodium chloride infusion    Activities of Daily Living In your present state of health, do you have any difficulty performing the following activities: 10/09/2018  Hearing? N  Vision? N  Difficulty concentrating or making decisions? N  Walking or climbing stairs? N  Dressing or bathing? N  Doing errands, shopping? N  Preparing Food and eating ? N  Using the Toilet? N  In the past six months, have you accidently leaked urine? N  Do you have problems with loss of bowel control? N  Managing your Medications? N  Managing your Finances? N  Housekeeping or managing your Housekeeping? N  Some recent data might be hidden    Patient Care Team: Colon Branch, MD as PCP - General (Internal Medicine) [name], Denice Bors, MD as Consulting Physician (Cardiology) Iona Beard, [place] as Referring Physician (Optometry) Orie Rout as Referring Physician (Dentistry) Paralee Cancel, MD as Consulting Physician (Orthopedic Surgery) Fanny Skates, MD as Consulting Physician (General Surgery)   Assessment:   This is a routine wellness examination for Derak. Physical assessment deferred to PCP.  Exercise Activities and Dietary recommendations Current Exercise Habits: Home exercise routine, Type of exercise: stretching;strength training/weights(elliptical and core), Time (Minutes): 40, Frequency (Times/Week): 7, Weekly Exercise (Minutes/Week): 280,  Intensity: Mild, Exercise limited by: None identified   Diet (meal preparation, eat out, water intake, caffeinated beverages, dairy products, fruits and vegetables): in general, a "healthy" diet  , well balanced, on average, 3 meals per day Breakfast: smoked salmon, cream cheese, crackers, coffee, juice Lunch: cheese crackers and fruit Dinner:  Seafood or chicken and vegetables  Goals    . Maintain healthy active lifestyle. (pt-stated)    . Weight (lb) < 190 lb (86.2 kg)       Fall Risk Fall Risk  10/09/2018 09/26/2017 09/24/2016 08/07/2016 05/06/2016  Falls in the past year? 0 No No No No  Risk for fall due to : - - - - -  Risk for fall due to: Comment - - - - -   Depression Screen PHQ 2/9 Scores 10/09/2018 09/26/2017 09/24/2016 08/07/2016  PHQ - 2 Score 0 0 0 0    Cognitive Function Ad8 score reviewed for issues:  Issues making decisions:no  Less interest in hobbies / activities:no  Repeats questions, stories (family complaining):no  Trouble using ordinary gadgets (microwave, computer, phone):no  Forgets the month or year: no  Mismanaging finances: no  Remembering appts:no  Daily problems with thinking and/or memory:no Ad8 score is=0     MMSE - Mini Mental State Exam 09/26/2017 09/22/2015  Orientation to time 5 5  Orientation to Place 5 5  Registration 3 3  Attention/ Calculation 5 5  Recall 3 3  Language- name 2 objects 2 2  Language- repeat 1 1  Language- follow 3 step command 3 3  Language- read & follow direction 1 1  Write a sentence 1 1  Copy design 1 1  Total score 30 30        Immunization History  Administered Date(s) Administered  . HiB (PRP-OMP) 06/23/2007  . Influenza Split 08/15/2011  .  Influenza, High Dose Seasonal PF 08/07/2016, 09/26/2017  . Influenza,inj,Quad PF,6+ Mos 06/06/2014, 09/22/2015  . Influenza-Unspecified 05/30/2018  . Meningococcal B, OMV 01/16/2017, 03/21/2017  . Meningococcal Conjugate 02/06/2016  . Meningococcal  Polysaccharide 06/23/2007  . Pneumococcal Conjugate-13 09/22/2015  . Pneumococcal Polysaccharide-23 01/29/2013  . Td 08/26/2006, 09/26/2017  . Zoster 02/06/2016   Screening Tests Health Maintenance  Topic Date Due  . COLONOSCOPY  09/08/2024  . TETANUS/TDAP  09/27/2027  . INFLUENZA VACCINE  Completed  . Hepatitis C Screening  Completed  . PNA vac Low Risk Adult  Completed     Plan:    Please schedule your next medicare wellness visit with me in 1 yr.  Continue to eat heart healthy diet (full of fruits, vegetables, whole grains, lean protein, water--limit salt, fat, and sugar intake)   Continue exercising  Bring a copy of your living will and/or healthcare power of attorney to your next office visit.   I have personally reviewed and noted the following in the patient's chart:   . Medical and social history . Use of alcohol, tobacco or illicit drugs  . Current medications and supplements . Functional ability and status . Nutritional status . Physical activity . Advanced directives . List of other physicians . Hospitalizations, surgeries, and ER visits in previous 12 months . Vitals . Screenings to include cognitive, depression, and falls . Referrals and appointments  In addition, I have reviewed and discussed with patient certain preventive protocols, quality metrics, and best practice recommendations. A written personalized care plan for preventive services as well as general preventive health recommendations were provided to patient.     Naaman Plummer [place], [place]  10/09/2018  Kathlene November, MD

## 2018-10-09 ENCOUNTER — Ambulatory Visit (INDEPENDENT_AMBULATORY_CARE_PROVIDER_SITE_OTHER): Payer: PPO | Admitting: *Deleted

## 2018-10-09 ENCOUNTER — Encounter: Payer: Self-pay | Admitting: Internal Medicine

## 2018-10-09 ENCOUNTER — Ambulatory Visit (INDEPENDENT_AMBULATORY_CARE_PROVIDER_SITE_OTHER): Payer: PPO | Admitting: Internal Medicine

## 2018-10-09 ENCOUNTER — Encounter: Payer: Self-pay | Admitting: *Deleted

## 2018-10-09 VITALS — BP 124/66 | HR 60 | Ht 65.0 in | Wt 203.8 lb

## 2018-10-09 DIAGNOSIS — R5383 Other fatigue: Secondary | ICD-10-CM | POA: Diagnosis not present

## 2018-10-09 DIAGNOSIS — Z0000 Encounter for general adult medical examination without abnormal findings: Secondary | ICD-10-CM

## 2018-10-09 DIAGNOSIS — E669 Obesity, unspecified: Secondary | ICD-10-CM

## 2018-10-09 DIAGNOSIS — I10 Essential (primary) hypertension: Secondary | ICD-10-CM

## 2018-10-09 DIAGNOSIS — E7800 Pure hypercholesterolemia, unspecified: Secondary | ICD-10-CM | POA: Diagnosis not present

## 2018-10-09 DIAGNOSIS — Z89512 Acquired absence of left leg below knee: Secondary | ICD-10-CM | POA: Diagnosis not present

## 2018-10-09 DIAGNOSIS — R739 Hyperglycemia, unspecified: Secondary | ICD-10-CM | POA: Diagnosis not present

## 2018-10-09 LAB — LIPID PANEL
CHOL/HDL RATIO: 2
Cholesterol: 118 mg/dL (ref 0–200)
HDL: 67.5 mg/dL (ref >39.00)
LDL CALC: 40 mg/dL (ref 0–99)
NONHDL: 50.02
TRIGLYCERIDES: 50 mg/dL (ref 0.0–149.0)
VLDL: 10 mg/dL (ref 0.0–40.0)

## 2018-10-09 LAB — VITAMIN D 25 HYDROXY (VIT D DEFICIENCY, FRACTURES): VITD: 19.2 ng/mL — ABNORMAL LOW (ref 30.00–100.00)

## 2018-10-09 LAB — COMPREHENSIVE METABOLIC PANEL
ALT: 24 U/L (ref 0–53)
AST: 17 U/L (ref 0–37)
Albumin: 4.2 g/dL (ref 3.5–5.2)
Alkaline Phosphatase: 69 U/L (ref 39–117)
BUN: 15 mg/dL (ref 6–23)
CHLORIDE: 104 meq/L (ref 96–112)
CO2: 28 meq/L (ref 19–32)
Calcium: 9 mg/dL (ref 8.4–10.5)
Creatinine, Ser: 0.78 mg/dL (ref 0.40–1.50)
GFR: 98.15 mL/min (ref >60.00)
GLUCOSE: 95 mg/dL (ref 70–99)
Potassium: 4.1 mEq/L (ref 3.5–5.1)
SODIUM: 140 meq/L (ref 135–145)
Total Bilirubin: 1.3 mg/dL — ABNORMAL HIGH (ref 0.2–1.2)
Total Protein: 6.1 g/dL (ref 6.0–8.3)

## 2018-10-09 LAB — CBC WITH DIFFERENTIAL/PLATELET
Basophils Absolute: 0 10*3/uL (ref 0.0–0.1)
Basophils Relative: 0.6 % (ref 0.0–3.0)
Eosinophils Absolute: 0 10*3/uL (ref 0.0–0.7)
Eosinophils Relative: 0.8 % (ref 0.0–5.0)
HCT: 45 % (ref 39.0–52.0)
Hemoglobin: 15.1 g/dL (ref 13.0–17.0)
Lymphocytes Relative: 30 % (ref 12.0–46.0)
Lymphs Abs: 1.7 10*3/uL (ref 0.7–4.0)
MCHC: 33.6 g/dL (ref 30.0–36.0)
MCV: 95.7 fl (ref 78.0–100.0)
Monocytes Absolute: 0.5 10*3/uL (ref 0.1–1.0)
Monocytes Relative: 8.6 % (ref 3.0–12.0)
NEUTROS ABS: 3.5 10*3/uL (ref 1.4–7.7)
NEUTROS PCT: 60 % (ref 43.0–77.0)
PLATELETS: 244 10*3/uL (ref 150.0–400.0)
RBC: 4.7 Mil/uL (ref 4.22–5.81)
RDW: 12.9 % (ref 11.5–15.5)
WBC: 5.8 10*3/uL (ref 4.0–10.5)

## 2018-10-09 LAB — TSH: TSH: 1.47 u[IU]/mL (ref 0.35–4.50)

## 2018-10-09 LAB — HEMOGLOBIN A1C: Hgb A1c MFr Bld: 5.6 % (ref 4.6–6.5)

## 2018-10-09 LAB — B12 AND FOLATE PANEL
Folate: 8.2 ng/mL (ref >5.9)
Vitamin B-12: 166 pg/mL — ABNORMAL LOW (ref 211–911)

## 2018-10-09 MED ORDER — AMBULATORY NON FORMULARY MEDICATION: 0 refills | Status: DC

## 2018-10-09 NOTE — Progress Notes (Signed)
Pre visit review using our clinic review tool, if applicable. No additional management support is needed unless otherwise documented below in the visit note. 

## 2018-10-09 NOTE — Progress Notes (Signed)
Subjective:    Patient ID: Devin Martin, male    DOB: 06/16/1946, 73 y.o.   MRN: [id number]  DOS:  10/09/2018 Type of visit - description: Routine visit HTN: Good compliance to medication, no ambulatory BPs History of amputation, needs a new prescription for a liner Lack of energy: That is actually his main concern, he simply is not energetic.  Denies any cardiorespiratory symptoms. CPAP: Compliance is actually 100%.  Wt Readings from Last 3 Encounters:  10/09/18 203 lb 12 oz (92.4 kg)  10/09/18 203 lb 12.8 oz (92.4 kg)  07/19/18 190 lb (86.2 kg)     Review of Systems No fever chills No chest pain no difficulty breathing No lower extremity edema No DOE No anxiety or depression  Past Medical History:  Diagnosis Date  . Arthritis 1968   pt wears a lt bk prosthesis  . Bronchitis, chronic ([place])   . CAD (coronary artery disease)    Non-ST segment elevation myocardial infarction in 2005 with drug-eluting stent to the left anterior descending. 08-2009 - non-ST segment elevation myocardial infarction, felt secondary to vasospasm.  . Calculus, kidney    hx  . Complication of anesthesia    itching after preop med  ?name - 2013, no SOB  . Glaucoma   . Hyperlipidemia   . Hypertension   . Myocardial infarction [facility]) 2005   x2, 2005, 2010  . OSA (obstructive sleep apnea)    AHI 68/hr in 2002-uses a cpap  . Pneumonia    hx of   . Sleep apnea    wears CPAP  . Wears glasses     Past Surgical History:  Procedure Laterality Date  . 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 CARDIAC CATHETERIZATION    . CYST REMOVAL TRUNK  09/04/2012   Procedure: CYST REMOVAL TRUNK;  Surgeon: Ralene Ok, MD;  Location: [place];  Service: General;  Laterality: N/A;  EXCISION OF MID BACK MASS  . EYE SURGERY Left DR[place]   CATARACT SX1/2019  . INCISION AND DRAINAGE ABSCESS Left 07/31/2013   Procedure: INCISIONAL/NON-INCISIONAL DEBRIDEMENT OF ABSCESS LEFT KNEE;   Surgeon: Mauri Pole, MD;  [facility];  Service: Orthopedics;  Laterality: Left;  . KIDNEY STONE SURGERY     multiple episodes   . Right Knee Arthroscopy  2010   [place] [facility]  . TONSILLECTOMY      Social History   Socioeconomic History  . Marital status: Married    Spouse name: Not on file  . Number of children: 2  . Years of education: Not on file  . Highest education level: Not on file  Occupational History  . 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 Financial resource strain: Not on file  . Food insecurity:    Worry: Not on file    Inability: Not on file  . Transportation needs:    Medical: Not on file    Non-medical: Not on file  Tobacco Use  . Smoking status: Former Smoker    Packs/day: 0.75    Years: 20.00    Pack years: 15.00    Types: Cigarettes  . Smokeless tobacco: Never Used  . Tobacco comment: currently doing some e-cigarrets (started at age 35. less than 1 ppd.)   Substance and Sexual Activity  . Alcohol use: Yes    Comment: bourbon 3 fingers 3 x/week.  . Drug use: No  . Sexual activity: Not Currently  Lifestyle  .  Physical activity:    Days per week: Not on file    Minutes per session: Not on file  . Stress: Not on file  Relationships  . Social connections:    Talks on phone: Not on file    Gets together: Not on file    Attends religious service: Not on file    Active member of club or organization: Not on file    Attends meetings of clubs or organizations: Not on file    Relationship status: Not on file  . Intimate partner violence:    Fear of current or ex partner: Not on file    Emotionally abused: Not on file    Physically abused: Not on file    Forced sexual activity: Not on file  Other Topics Concern  . Not on file  Social History Narrative   Lives w/ wife       Allergies as of 10/09/2018      Reactions   Penicillins Hives, Other (See Comments)   syncope   Tylox [oxycodone-acetaminophen] Other (See  Comments)   "messes me up"   Codeine Nausea Only      Medication List       Accurate as of October 09, 2018 11:59 PM. Always use your most recent med list.        ADULT ASPIRIN LOW STRENGTH 81 MG EC tablet Generic drug:  Aspirin Take 81 mg by mouth every evening.   AMBULATORY NON FORMULARY MEDICATION Prosthetic Leg Liners   diltiazem 240 MG 24 hr capsule Commonly known as:  TIAZAC TAKE 1 CAPSULE BY MOUTH EVERY DAY   GLUCOSAMINE PO Take 1 tablet by mouth every evening.   isosorbide mononitrate 60 MG 24 hr tablet Commonly known as:  IMDUR Take 1 tablet (60 mg total) by mouth daily.   nitroGLYCERIN 0.4 MG SL tablet Commonly known as:  NITROSTAT PLACE 1 TABLET (0.4 MG TOTAL) UNDER THE TONGUE EVERY 5 (FIVE) MINUTES AS NEEDED FOR CHEST PAIN.   omeprazole 20 MG capsule Commonly known as:  PRILOSEC TAKE 1 CAPSULE BY MOUTH TWICE DAILY   potassium citrate 10 MEQ (1080 MG) SR tablet Commonly known as:  UROCIT-K TAKE 2 TABLETS BY MOUTH EVERY DAY   rosuvastatin 40 MG tablet Commonly known as:  CRESTOR TAKE 1 TABLET(40 MG) BY MOUTH DAILY           Objective:   Physical Exam BP 124/66 (BP Location: Left Arm, Patient Position: Sitting, Cuff Size: Normal)   Pulse 60   Ht 5\' 5"  (1.651 m)   Wt 203 lb 12 oz (92.4 kg)   SpO2 97%   BMI 33.91 kg/m  General:   Well developed, NAD, BMI noted. HEENT:  Normocephalic . Face symmetric, atraumatic Lungs:  CTA B Normal respiratory effort, no intercostal retractions, no accessory muscle use. Heart: RRR,  no murmur.  No pretibial edema bilaterally  Skin: Not pale. Not jaundice Neurologic:  alert & oriented X3.  Speech normal, gait limited by history of left leg amputation. Psych--  Cognition and judgment appear intact.  Cooperative with normal attention span and concentration.  Behavior appropriate. No anxious or depressed appearing.      Assessment     Assessment Hyperglycemia: A1c 5.01 February 2016 HTN Hyperlipidemia GERD , sporadic sx  Obesity  OSA, + CPAP CAD: NSTEMI 2005, stent. 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 2008 --meningococcal  2008, menactra booster 2017 L LE amputation d/t  trauma Cholelithiasis per ultrasound 09-2015 H/o Urolithiasis  PLAN: Hyperglycemia: Check A1c and a TSH HTN: Currently on diltiazem.  Seems controlled, check a CMP, CBC. Obesity, weight gain: We again talked about diet and exercise, he continues to have issues with portion control.  He is trying to be active.  Strongly encouraged him to be seen at the wellness clinic or talk with a dietitian. OSA: Reports essentially 100% compliance with CPAP Fatigue: Likely multifactorial, probably exacerbated by recent weight gain.  No chest pain, DOE or shortness of breath.  Check vitamin D, folic acid and J28. 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  Prescription for a new liner provided RTC 6 months CPX

## 2018-10-09 NOTE — Patient Instructions (Signed)
Please schedule your next medicare wellness visit with me in 1 yr.  Continue to eat heart healthy diet (full of fruits, vegetables, whole grains, lean protein, water--limit salt, fat, and sugar intake)   Continue exercising  Bring a copy of your living will and/or healthcare power of attorney to your next office visit.   Devin Martin , Thank you for taking time to come for your Medicare Wellness Visit. I appreciate your ongoing commitment to your health goals. Please review the following plan we discussed and let me know if I can assist you in the future.   These are the goals we discussed: Goals    . Maintain healthy active lifestyle. (pt-stated)    . Weight (lb) < 190 lb (86.2 kg)       This is a list of the screening recommended for you and due dates:  Health Maintenance  Topic Date Due  . Colon Cancer Screening  09/08/2024  . Tetanus Vaccine  09/27/2027  . Flu Shot  Completed  .  Hepatitis C: One time screening is recommended by Center for Disease Control  (CDC) for  adults born from [birth year] through 1963.   Completed  . Pneumonia vaccines  Completed    Health Maintenance After Age 43 After age 32, you are at a higher risk for certain long-term diseases and infections as well as injuries from falls. Falls are a major cause of broken bones and head injuries in people who are older than age 70. Getting regular preventive care can help to keep you healthy and well. Preventive care includes getting regular testing and making lifestyle changes as recommended by your health care provider. Talk with your health care provider about:  Which screenings and tests you should have. A screening is a test that checks for a disease when you have no symptoms.  A diet and exercise plan that is right for you. What should I know about screenings and tests to prevent falls? Screening and testing are the best ways to find a health problem early. Early diagnosis and treatment give you the best chance of  managing medical conditions that are common after age 20. Certain conditions and lifestyle choices may make you more likely to have a fall. Your health care provider may recommend:  Regular vision checks. Poor vision and conditions such as cataracts can make you more likely to have a fall. If you wear glasses, make sure to get your prescription updated if your vision changes.  Medicine review. Work with your health care provider to regularly review all of the medicines you are taking, including over-the-counter medicines. Ask your health care provider about any side effects that may make you more likely to have a fall. Tell your health care provider if any medicines that you take make you feel dizzy or sleepy.  Osteoporosis screening. Osteoporosis is a condition that causes the bones to get weaker. This can make the bones weak and cause them to break more easily.  Blood pressure screening. Blood pressure changes and medicines to control blood pressure can make you feel dizzy.  Strength and balance checks. Your health care provider may recommend certain tests to check your strength and balance while standing, walking, or changing positions.  Foot health exam. Foot pain and numbness, as well as not wearing proper footwear, can make you more likely to have a fall.  Depression screening. You may be more likely to have a fall if you have a fear of falling, feel emotionally low, or  feel unable to do activities that you used to do.  Alcohol use screening. Using too much alcohol can affect your balance and may make you more likely to have a fall. What actions can I take to lower my risk of falls? General instructions  Talk with your health care provider about your risks for falling. Tell your health care provider if: ? You fall. Be sure to tell your health care provider about all falls, even ones that seem minor. ? You feel dizzy, sleepy, or off-balance.  Take over-the-counter and prescription  medicines only as told by your health care provider. These include any supplements.  Eat a healthy diet and maintain a healthy weight. A healthy diet includes low-fat dairy products, low-fat (lean) meats, and fiber from whole grains, beans, and lots of fruits and vegetables. Home safety  Remove any tripping hazards, such as rugs, cords, and clutter.  Install safety equipment such as grab bars in bathrooms and safety rails on stairs.  Keep rooms and walkways well-lit. Activity   Follow a regular exercise program to stay fit. This will help you maintain your balance. Ask your health care provider what types of exercise are appropriate for you.  If you need a cane or walker, use it as recommended by your health care provider.  Wear supportive shoes that have nonskid soles. Lifestyle  Do not drink alcohol if your health care provider tells you not to drink.  If you drink alcohol, limit how much you have: ? 0-1 drink a day for women. ? 0-2 drinks a day for men.  Be aware of how much alcohol is in your drink. In the [place] one drink equals one typical bottle of beer (12 oz), one-half glass of wine (5 oz), or one shot of hard liquor (1 oz).  Do not use any products that contain nicotine or tobacco, such as cigarettes and e-cigarettes. If you need help quitting, ask your health care provider. Summary  Having a healthy lifestyle and getting preventive care can help to protect your health and wellness after age 64.  Screening and testing are the best way to find a health problem early and help you avoid having a fall. Early diagnosis and treatment give you the best chance for managing medical conditions that are more common for people who are older than age 31.  Falls are a major cause of broken bones and head injuries in people who are older than age 72. Take precautions to prevent a fall at home.  Work with your health care provider to learn what changes you can make to improve your  health and wellness and to prevent falls. This information is not intended to replace advice given to you by your health care provider. Make sure you discuss any questions you have with your health care provider. Document Released: 06/25/2017 Document Revised: 06/25/2017 Document Reviewed: 06/25/2017 Elsevier Interactive Patient Education  2019 [employer].

## 2018-10-09 NOTE — Patient Instructions (Signed)
GO TO THE LAB : Get the blood work     GO TO THE FRONT DESK Schedule your next appointment   a physical exam in 6 months.  Please consider see the wellness clinic or a dietitian.

## 2018-10-10 NOTE — Assessment & Plan Note (Signed)
Hyperglycemia: Check A1c and a TSH HTN: Currently on diltiazem.  Seems controlled, check a CMP, CBC. Obesity, weight gain: We again talked about diet and exercise, he continues to have issues with portion control.  He is trying to be active.  Strongly encouraged him to be seen at the wellness clinic or talk with a dietitian. OSA: Reports essentially 100% compliance with CPAP Fatigue: Likely multifactorial, probably exacerbated by recent weight gain.  No chest pain, DOE or shortness of breath.  Check vitamin D, folic acid and D57. 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  Prescription for a new liner provided RTC 6 months CPX

## 2018-10-12 ENCOUNTER — Encounter: Payer: Self-pay | Admitting: Internal Medicine

## 2018-10-12 ENCOUNTER — Telehealth: Payer: Self-pay | Admitting: Internal Medicine

## 2018-10-12 MED ORDER — AMBULATORY NON FORMULARY MEDICATION: 0 refills | Status: AC

## 2018-10-12 NOTE — Telephone Encounter (Signed)
Copied from [place] [phone] [phone]. Topic: Quick Communication - See Telephone Encounter >> Oct 12, 2018  4:54 PM Blase Mess A wrote: CRM for notification. See Telephone encounter for: 10/12/18.  Mechele Claude with [facility] is calling regarding AMBULATORY NON FORMULARY MEDICATION [[id number]] the quanity is 1 The patient is needing two. So a new script needs to be sent. Please advise [phone]

## 2018-10-12 NOTE — Telephone Encounter (Signed)
Script updated to 2 and faxed to [facility] at [phone].

## 2018-10-13 MED ORDER — VITAMIN D (ERGOCALCIFEROL) 1.25 MG (50000 UNIT) PO CAPS: 50000.0000 [IU] | ORAL_CAPSULE | ORAL | 0 refills | Status: AC

## 2018-10-13 NOTE — Addendum Note (Signed)
Addended byDamita Dunnings D on: 10/13/2018 07:47 AM   Modules accepted: Orders

## 2018-10-27 ENCOUNTER — Telehealth: Payer: Self-pay

## 2018-10-27 NOTE — Telephone Encounter (Signed)
Order signed and faxed to [facility] w/ last OV note at [phone]. Form sent for scanning.

## 2018-11-04 ENCOUNTER — Encounter: Payer: Self-pay | Admitting: Pulmonary Disease

## 2018-11-04 ENCOUNTER — Ambulatory Visit: Payer: PPO | Admitting: Pulmonary Disease

## 2018-11-04 ENCOUNTER — Other Ambulatory Visit: Payer: Self-pay

## 2018-11-04 VITALS — BP 114/64 | HR 66 | Ht 65.0 in | Wt 197.6 lb

## 2018-11-04 DIAGNOSIS — G4733 Obstructive sleep apnea (adult) (pediatric): Secondary | ICD-10-CM | POA: Diagnosis not present

## 2018-11-04 DIAGNOSIS — Z89512 Acquired absence of left leg below knee: Secondary | ICD-10-CM | POA: Diagnosis not present

## 2018-11-04 NOTE — Progress Notes (Signed)
[facility]  Chief Complaint  Patient presents with  . Follow-up    Overall doing well with cpap machine.    Constitutional:  BP 114/64 (BP Location: Left Arm, Cuff Size: Normal)   Pulse 66   Ht 5\' 5"  (1.651 m)   Wt 197 lb 9.6 oz (89.6 kg)   SpO2 96%   BMI 32.88 kg/m   Past Medical History:  PNA, CAD, HTN, HLD, Glaucoma, Nephrolithiasis, OA, s/p UPPP  Brief Summary:  Devin Martin is a 73 y.o. male  with obstructive sleep apnea.  He uses CPAP nightly.  Has travel machine also.  No issues with mask fit.  Sleeps well and feels rested.  Physical Exam:   Appearance - well kempt   ENMT - clear nasal mucosa, midline nasal  septum, no oral exudates, no LAN, trachea midline, s/p UPPP  Respiratory - normal chest wall, normal respiratory effort, no accessory muscle use, no wheeze/rales  CV - s1s2 regular rate and rhythm, no murmurs, no peripheral edema, radial pulses symmetric  GI - soft, non tender, no masses  Lymph - no adenopathy noted in neck and axillary areas  MSK - normal gait  Ext - no cyanosis, clubbing, or joint inflammation noted  Skin - no rashes, lesions, or ulcers  Neuro - normal strength, oriented x 3  Psych - normal mood and affect   Assessment/Plan:   Obstructive sleep apnea. - he is compliant with CPAP and reports benefit - continue auto CPAP 11 to 20 cm H2O - he will set his travel machine to 12 cm H2O - he will look online about getting a new travel machine   Patient Instructions  Can check CPAP.com or similar web site to find travel CPAP machines  Follow up in 1 year    Chesley Mires, MD [place] Pager: [phone] 11/04/2018, 11:08 AM  Flow Sheet      Sleep tests:  PSG 03/31/01 >> AHI 68 Auto CPAP 10/03/18 to 11/01/18 >> used on 17 of 30 nights with average 7 hrs 29 min.  Average AHI 1.9 with median CPAP 12 and 95 th percentile CPAP 13 cm H2O  Medications:    Allergies as of 11/04/2018      Reactions   Penicillins Hives, Other (See Comments)   syncope   Tylox [oxycodone-acetaminophen] Other (See Comments)   "messes me up"   Codeine Nausea Only      Medication List       Accurate as of November 04, 2018 11:08 AM. Always use your most recent med list.        Adult Aspirin Low Strength 81 MG EC tablet Generic drug:  Aspirin Take 81 mg by mouth every evening.   AMBULATORY NON FORMULARY MEDICATION Prosthetic Leg Liners   diltiazem 240 MG 24 hr capsule Commonly known as:  TIAZAC TAKE 1 CAPSULE BY MOUTH EVERY DAY   GLUCOSAMINE PO Take 1 tablet by mouth every evening.   isosorbide mononitrate 60 MG 24 hr tablet Commonly known as:  IMDUR Take 1 tablet (60 mg total) by mouth daily.   nitroGLYCERIN 0.4 MG SL tablet Commonly known as:  Nitrostat PLACE 1 TABLET (0.4 MG TOTAL) UNDER THE TONGUE EVERY 5 (FIVE) MINUTES AS NEEDED FOR CHEST PAIN.   omeprazole 20 MG capsule Commonly known as:  PRILOSEC TAKE 1 CAPSULE BY MOUTH TWICE DAILY   potassium citrate 10 MEQ (1080 MG) SR tablet Commonly known as:  UROCIT-K TAKE 2 TABLETS  BY MOUTH EVERY DAY   rosuvastatin 40 MG tablet Commonly known as:  CRESTOR TAKE 1 TABLET(40 MG) BY MOUTH DAILY   Vitamin D (Ergocalciferol) 1.25 MG (50000 UT) Caps capsule Commonly known as:  DRISDOL Take 1 capsule (50,000 Units total) by mouth every 7 (seven) days. For 12 weeks       Past Surgical History:  He  has a past surgical history that includes AMPUT TRAUM LEG BELOW KNEE UNILT W/O COMPL (Left); Right Knee Arthroscopy (2010); Tonsillectomy; Cyst removal trunk (09/04/2012); Cardiac catheterization; Kidney stone surgery; Incision and drainage abscess (Left, 07/31/2013); and Eye surgery (Left, DR.[place]).  Family History:  His family history includes Cancer in his mother; Heart attack in his father and paternal grandfather.  Social History:  He  reports that he has quit smoking. His smoking use included  cigarettes. He has a 15.00 pack-year smoking history. He has never used smokeless tobacco. He reports current alcohol use. He reports that he does not use drugs.

## 2018-11-04 NOTE — Patient Instructions (Signed)
Can check CPAP.com or similar web site to find travel CPAP machines  Follow up in 1 year

## 2018-12-25 ENCOUNTER — Other Ambulatory Visit: Payer: Self-pay | Admitting: Internal Medicine

## 2018-12-31 ENCOUNTER — Other Ambulatory Visit: Payer: Self-pay | Admitting: Internal Medicine

## 2019-01-08 ENCOUNTER — Telehealth: Payer: Self-pay | Admitting: Cardiology

## 2019-01-08 NOTE — Telephone Encounter (Signed)
Spoke with pt, he has noticed for the last couple months increased bruising. He reports they look like old man bruising. The only change is the patient has started vit B12. Explained it maybe his skin is getting thinner and he can see the bruising more but the only medication he is taking is aspirin that can thin the blood. Patient voiced understanding and a follow up was scheduled.

## 2019-01-08 NOTE — Telephone Encounter (Signed)
New Message:    Pt says he have started bruising really bad. The bruise will last sometimes 3 to 4 weeks,the sskin wiometimes

## 2019-01-09 ENCOUNTER — Other Ambulatory Visit: Payer: Self-pay | Admitting: Cardiology

## 2019-03-02 ENCOUNTER — Other Ambulatory Visit: Payer: Self-pay | Admitting: Cardiology

## 2019-03-02 DIAGNOSIS — R072 Precordial pain: Secondary | ICD-10-CM

## 2019-03-18 ENCOUNTER — Telehealth: Payer: Self-pay | Admitting: Cardiology

## 2019-03-18 NOTE — Telephone Encounter (Signed)
Spoke with pt, no labs needed.

## 2019-03-18 NOTE — Telephone Encounter (Signed)
Pt calling inquiring if he need to have labs drawn prior to appointment.

## 2019-03-18 NOTE — Telephone Encounter (Signed)
  Patient would like to speak with Hilda Blades before he has his visit

## 2019-03-23 NOTE — Progress Notes (Signed)
Virtual Visit via Video Note   This visit type was conducted due to national recommendations for restrictions regarding the COVID-19 Pandemic (e.g. social distancing) in an effort to limit this patient's exposure and mitigate transmission in our community.  Due to his co-morbid illnesses, this patient is at least at moderate risk for complications without adequate follow up.  This format is felt to be most appropriate for this patient at this time.  All issues noted in this document were discussed and addressed.  A limited physical exam was performed with this format.  Please refer to the patient's chart for his consent to telehealth for [facility].   Date:  03/25/2019   ID:  Devin Martin, DOB 09/02/1945, MRN [id number]  Patient Location:Home Provider Location: Home  PCP:  Colon Branch, MD  Cardiologist:  Dr [name]  Evaluation Performed:  Follow-Up Visit  Chief Complaint:  FU CAD  History of Present Illness:    FU coronary artery disease. The patient underwent cardiac catheterization in December 2005 secondary to a non-ST elevation myocardial infarction. At that time, his ejection fraction was 60%. He had successful PCI of his mid LAD with a drug-eluting stent. Note, there was jailing of 2 small diagonals by the LAD stent. He underwent cardiac catheterization in January of 2011 following MI. This revealed an ejection fraction of 55-60%. The stent in the LAD was patent. The stent jails 2 diagonal branches, both diagonals have 90% ostial stenoses. There was a 25-30% circumflex. The right coronary artery had a 25-30% lesion. There was intense vasospasm and it was felt that his infarct was related to spasm. Abdominal ultrasound 2/17showed no aneurysm. Nuclear study 10/17 showed EF 52 with septal infarct and mild apical ischemia; treated medically. Since I last saw him, the patient denies any dyspnea on exertion, orthopnea, PND, pedal edema, palpitations, syncope or chest pain.   The  patient does not have symptoms concerning for COVID-19 infection (fever, chills, cough, or new shortness of breath).    Past Medical History:  Diagnosis Date  . Arthritis 1968   pt wears a lt bk prosthesis  . Bronchitis, chronic ([place])   . CAD (coronary artery disease)    Non-ST segment elevation myocardial infarction in 2005 with drug-eluting stent to the left anterior descending. 08-2009 - non-ST segment elevation myocardial infarction, felt secondary to vasospasm.  . Calculus, kidney    hx  . Complication of anesthesia    itching after preop med  ?name - 2013, no SOB  . Glaucoma   . Hyperlipidemia   . Hypertension   . Myocardial infarction [facility]) 2005   x2, 2005, 2010  . OSA (obstructive sleep apnea)    AHI 68/hr in 2002-uses a cpap  . Pneumonia    hx of   . Sleep apnea    wears CPAP  . Wears glasses    Past Surgical History:  Procedure Laterality Date  . 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 CARDIAC CATHETERIZATION    . CYST REMOVAL TRUNK  09/04/2012   Procedure: CYST REMOVAL TRUNK;  Surgeon: Ralene Ok, MD;  Location: [facility];  Service: General;  Laterality: N/A;  EXCISION OF MID BACK MASS  . EYE SURGERY Left DR[place]   CATARACT SX1/2019  . INCISION AND DRAINAGE ABSCESS Left 07/31/2013   Procedure: INCISIONAL/NON-INCISIONAL DEBRIDEMENT OF ABSCESS LEFT KNEE;  Surgeon: Mauri Pole, MD;  Location: [facility];  Service: Orthopedics;  Laterality: Left;  . KIDNEY  STONE SURGERY     multiple episodes   . Right Knee Arthroscopy  2010   [facility]  . TONSILLECTOMY       Current Meds  Medication Sig  . AMBULATORY NON FORMULARY MEDICATION Prosthetic Leg Liners  . Aspirin (ADULT ASPIRIN LOW STRENGTH) 81 MG EC tablet Take 81 mg by mouth every evening.   . diltiazem (TIAZAC) 240 MG 24 hr capsule TAKE 1 CAPSULE BY MOUTH EVERY DAY  . Glucosamine HCl (GLUCOSAMINE PO) Take 1 tablet by mouth every evening.   . isosorbide mononitrate (IMDUR) 60 MG 24 hr  tablet TAKE 1 AND 1/2 TABLETS(90 MG) BY MOUTH DAILY  . nitroGLYCERIN (NITROSTAT) 0.4 MG SL tablet PLACE 1 TABLET (0.4 MG TOTAL) UNDER THE TONGUE EVERY 5 (FIVE) MINUTES AS NEEDED FOR CHEST PAIN.  Marland Kitchen omeprazole (PRILOSEC) 20 MG capsule TAKE 1 CAPSULE BY MOUTH TWICE DAILY (Patient taking differently: Take 20 mg by mouth daily. )  . potassium citrate (UROCIT-K) 10 MEQ (1080 MG) SR tablet TAKE 2 TABLETS BY MOUTH EVERY DAY  . rosuvastatin (CRESTOR) 40 MG tablet TAKE 1 TABLET(40 MG) BY MOUTH DAILY     Allergies:   Penicillins, Tylox [oxycodone-acetaminophen], and Codeine   Social History   Tobacco Use  . Smoking status: Former Smoker    Packs/day: 0.75    Years: 20.00    Pack years: 15.00    Types: Cigarettes  . Smokeless tobacco: Never Used  . Tobacco comment: currently doing some e-cigarrets (started at age 34. less than 1 ppd.)   Substance Use Topics  . Alcohol use: Yes    Comment: bourbon 3 fingers 3 x/week.  . Drug use: No     Family Hx: The patient's family history includes Cancer in his mother; Heart attack in his father and paternal grandfather. There is no history of Colon cancer, Prostate cancer, Diabetes, Esophageal cancer, Rectal cancer, or Stomach cancer.  ROS:   Please see the history of present illness.    No Fever, chills  or productive cough All other systems reviewed and are negative.  Recent Labs: 10/09/2018: ALT 24; BUN 15; Creatinine, Ser 0.78; Hemoglobin 15.1; Platelets 244.0; Potassium 4.1; Sodium 140; TSH 1.47   Recent Lipid Panel Lab Results  Component Value Date/Time   CHOL 118 10/09/2018 09:38 AM   TRIG 50.0 10/09/2018 09:38 AM   HDL 67.50 10/09/2018 09:38 AM   CHOLHDL 2 10/09/2018 09:38 AM   LDLCALC 40 10/09/2018 09:38 AM   LDLDIRECT 150.1 06/24/2007 09:00 AM    Wt Readings from Last 3 Encounters:  03/25/19 191 lb (86.6 kg)  11/04/18 197 lb 9.6 oz (89.6 kg)  10/09/18 203 lb 12 oz (92.4 kg)     Objective:    Vital Signs:  Ht 5' 4.5" (1.638 m)    Wt 191 lb (86.6 kg)   BMI 32.28 kg/m    VITAL SIGNS:  reviewed NAD Answers questions appropriately Normal affect Remainder of physical examination not performed (telehealth visit; coronavirus pandemic)  ASSESSMENT & PLAN:    1. Coronary artery disease-patient doing well with no chest pain.  Continue medical therapy with aspirin and statin. 2. Hyperlipidemia-continue statin. 3. Hypertension-blood pressure is controlled.  Continue present medications and follow.  COVID-19 Education: The importance of social distancing was discussed today.  Time:   Today, I have spent 18 minutes with the patient with telehealth technology discussing the above problems.     Medication Adjustments/Labs and Tests Ordered: Current medicines are reviewed at length with the patient  today.  Concerns regarding medicines are outlined above.   Tests Ordered: No orders of the defined types were placed in this encounter.   Medication Changes: No orders of the defined types were placed in this encounter.   Follow Up:  Virtual Visit or In Person in 1 year(s)  Signed, Kirk Ruths, MD  03/25/2019 1:08 PM    [place]

## 2019-03-25 ENCOUNTER — Telehealth (INDEPENDENT_AMBULATORY_CARE_PROVIDER_SITE_OTHER): Payer: PPO | Admitting: Cardiology

## 2019-03-25 ENCOUNTER — Telehealth: Payer: Self-pay | Admitting: Cardiology

## 2019-03-25 ENCOUNTER — Encounter: Payer: Self-pay | Admitting: Cardiology

## 2019-03-25 VITALS — Ht 64.5 in | Wt 191.0 lb

## 2019-03-25 DIAGNOSIS — I2510 Atherosclerotic heart disease of native coronary artery without angina pectoris: Secondary | ICD-10-CM

## 2019-03-25 DIAGNOSIS — E7800 Pure hypercholesterolemia, unspecified: Secondary | ICD-10-CM | POA: Diagnosis not present

## 2019-03-25 DIAGNOSIS — I10 Essential (primary) hypertension: Secondary | ICD-10-CM | POA: Diagnosis not present

## 2019-03-25 NOTE — Patient Instructions (Signed)

## 2019-03-25 NOTE — Telephone Encounter (Signed)
I called pt to get his consent  For Virtual Visit on 03-25-19, he was not available.ve

## 2019-04-01 ENCOUNTER — Other Ambulatory Visit: Payer: Self-pay | Admitting: Cardiology

## 2019-04-05 ENCOUNTER — Telehealth: Payer: Self-pay

## 2019-04-05 DIAGNOSIS — E7800 Pure hypercholesterolemia, unspecified: Secondary | ICD-10-CM

## 2019-04-05 DIAGNOSIS — E538 Deficiency of other specified B group vitamins: Secondary | ICD-10-CM

## 2019-04-05 DIAGNOSIS — Z0000 Encounter for general adult medical examination without abnormal findings: Secondary | ICD-10-CM

## 2019-04-05 DIAGNOSIS — E559 Vitamin D deficiency, unspecified: Secondary | ICD-10-CM

## 2019-04-05 DIAGNOSIS — I10 Essential (primary) hypertension: Secondary | ICD-10-CM

## 2019-04-05 NOTE — Telephone Encounter (Signed)
Fasting labs: B12 DX B12 deficiency Vitamin D, DX vitamin D deficiency, PSA BMP: CPX or hypertension FLP: High cholesterol

## 2019-04-05 NOTE — Telephone Encounter (Signed)
Orders placed- can you contact Pt to schedule fasting lab appt at his convenience please?

## 2019-04-05 NOTE — Telephone Encounter (Signed)
Copied from [place] [phone]. Topic: General - Inquiry >> Apr 05, 2019  9:01 AM Devin Martin wrote: Reason for CRM: Pt would like to request an order for labs prior to his CPE on 04/09/19. Please contact pt to schedule once orders have been entered.

## 2019-04-06 NOTE — Telephone Encounter (Signed)
Noted  

## 2019-04-06 NOTE — Telephone Encounter (Signed)
No lab appts available before 8/14 except for same day pts

## 2019-04-08 ENCOUNTER — Encounter: Payer: Self-pay | Admitting: Internal Medicine

## 2019-04-09 ENCOUNTER — Ambulatory Visit (INDEPENDENT_AMBULATORY_CARE_PROVIDER_SITE_OTHER): Payer: PPO | Admitting: Internal Medicine

## 2019-04-09 ENCOUNTER — Encounter: Payer: Self-pay | Admitting: Internal Medicine

## 2019-04-09 ENCOUNTER — Other Ambulatory Visit: Payer: Self-pay

## 2019-04-09 VITALS — BP 110/60 | HR 51 | Temp 95.7°F | Resp 16 | Ht 64.5 in | Wt 193.0 lb

## 2019-04-09 DIAGNOSIS — Z0000 Encounter for general adult medical examination without abnormal findings: Secondary | ICD-10-CM

## 2019-04-09 DIAGNOSIS — E7800 Pure hypercholesterolemia, unspecified: Secondary | ICD-10-CM | POA: Diagnosis not present

## 2019-04-09 DIAGNOSIS — E538 Deficiency of other specified B group vitamins: Secondary | ICD-10-CM

## 2019-04-09 DIAGNOSIS — Z125 Encounter for screening for malignant neoplasm of prostate: Secondary | ICD-10-CM

## 2019-04-09 DIAGNOSIS — E559 Vitamin D deficiency, unspecified: Secondary | ICD-10-CM | POA: Diagnosis not present

## 2019-04-09 LAB — LIPID PANEL
Cholesterol: 114 mg/dL (ref 0–200)
HDL: 59.2 mg/dL (ref >39.00)
LDL Cholesterol: 43 mg/dL (ref 0–99)
NonHDL: 54.9
Total CHOL/HDL Ratio: 2
Triglycerides: 59 mg/dL (ref 0.0–149.0)
VLDL: 11.8 mg/dL (ref 0.0–40.0)

## 2019-04-09 LAB — VITAMIN B12: Vitamin B-12: 539 pg/mL (ref 211–911)

## 2019-04-09 LAB — VITAMIN D 25 HYDROXY (VIT D DEFICIENCY, FRACTURES): VITD: 29.72 ng/mL — ABNORMAL LOW (ref 30.00–100.00)

## 2019-04-09 LAB — PSA: PSA: 0.79 ng/mL (ref 0.10–4.00)

## 2019-04-09 MED ORDER — SHINGRIX 50 MCG/0.5ML IM SUSR: 0.5000 mL | Freq: Once | INTRAMUSCULAR | 1 refills | Status: AC

## 2019-04-09 NOTE — Patient Instructions (Addendum)
GO TO THE LAB : Get the blood work     GO TO THE FRONT DESK Schedule your next appointment   For a check up in 6 months fasting

## 2019-04-09 NOTE — Assessment & Plan Note (Addendum)
-   Td 09/2017 - zostavax 2017 - PNM 23: 2014 - prevnar 2017 - shingrix discussed , rx printed  - flu shot today recommend -[facility]: colonoscopy 12/10, colonoscopy again 2016, next 10 years -Prostate  cancer screening: declined DRE, check a PSA -Labs:   B12, vitamin D, PSA, FLP -Doing well with diet,, is his only scales has lost several pounds.

## 2019-04-09 NOTE — Telephone Encounter (Signed)
Patient upset no one called him to let him know there were no available appointments for lab. He was prepared to have labs drawn ahead of physical. I explained to him the lack of open slots and he understood-just wished someone would have informed him of no slots.

## 2019-04-09 NOTE — Progress Notes (Signed)
Subjective:    Patient ID: Devin Martin, male    DOB: 05/16/1946, 73 y.o.   MRN: [id number]  DOS:  04/09/2019 Type of visit - description: cpx In general feeling well.  Has lost weight in his scales  Wt Readings from Last 3 Encounters:  04/09/19 193 lb (87.5 kg)  03/25/19 191 lb (86.6 kg)  11/04/18 197 lb 9.6 oz (89.6 kg)    Review of Systems  Other than above, a 14 point review of systems is negative    Past Medical History:  Diagnosis Date  . Arthritis 1968   pt wears a lt bk prosthesis  . Bronchitis, chronic ([place])   . CAD (coronary artery disease)    Non-ST segment elevation myocardial infarction in 2005 with drug-eluting stent to the left anterior descending. 08-2009 - non-ST segment elevation myocardial infarction, felt secondary to vasospasm.  . Calculus, kidney    hx  . Complication of anesthesia    itching after preop med  ?name - 2013, no SOB  . Glaucoma   . Hyperlipidemia   . Hypertension   . Myocardial infarction [facility]) 2005   x2, 2005, 2010  . OSA (obstructive sleep apnea)    AHI 68/hr in 2002-uses a cpap  . Pneumonia    hx of   . Sleep apnea    wears CPAP  . Wears glasses     Past Surgical History:  Procedure Laterality Date  . 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 CARDIAC CATHETERIZATION    . CYST REMOVAL TRUNK  09/04/2012   Procedure: CYST REMOVAL TRUNK;  Surgeon: Ralene Ok, MD;  Location: [place];  Service: General;  Laterality: N/A;  EXCISION OF MID BACK MASS  . EYE SURGERY Left DR.Henderson   CATARACT SX1/2019  . INCISION AND DRAINAGE ABSCESS Left 07/31/2013   Procedure: INCISIONAL/NON-INCISIONAL DEBRIDEMENT OF ABSCESS LEFT KNEE;  Surgeon: Mauri Pole, MD;  Location: [facility];  Service: Orthopedics;  Laterality: Left;  . KIDNEY STONE SURGERY     multiple episodes   . Right Knee Arthroscopy  2010   [facility]  . TONSILLECTOMY      Social History   Socioeconomic History  . Marital status: Married   Spouse name: Not on file  . Number of children: 2  . Years of education: Not on file  . Highest education level: Not on file  Occupational History  . Occupation: Therapist, sports-- [employer]  Social Needs  . Financial resource strain: Not on file  . Food insecurity    Worry: Not on file    Inability: Not on file  . Transportation needs    Medical: Not on file    Non-medical: Not on file  Tobacco Use  . Smoking status: Former Smoker    Packs/day: 0.75    Years: 20.00    Pack years: 15.00    Types: Cigarettes, E-cigarettes  . Smokeless tobacco: Never Used  . Tobacco comment: currently doing some e-cigarrets    Substance and Sexual Activity  . Alcohol use: Yes    Comment: bourbon 3 fingers 3 x/week.  . Drug use: No  . Sexual activity: Not Currently  Lifestyle  . Physical activity    Days per week: Not on file    Minutes per session: Not on file  . Stress: Not on file  Relationships  . Social Herbalist on phone: Not on file    Gets together: Not  on file    Attends religious service: Not on file    Active member of club or organization: Not on file    Attends meetings of clubs or organizations: Not on file    Relationship status: Not on file  . Intimate partner violence    Fear of current or ex partner: Not on file    Emotionally abused: Not on file    Physically abused: Not on file    Forced sexual activity: Not on file  Other Topics Concern  . Not on file  Social History Narrative   Lives w/ wife      Family History  Problem Relation Age of Onset  . Heart attack Father 18  . Heart attack Paternal Grandfather   . Cancer Mother        "male ca"  . Colon cancer Neg Hx   . Prostate cancer Neg Hx   . Diabetes Neg Hx   . Esophageal cancer Neg Hx   . Rectal cancer Neg Hx   . Stomach cancer Neg Hx      Allergies as of 04/09/2019      Reactions   Penicillins Hives, Other (See Comments)   syncope   Tylox [oxycodone-acetaminophen] Other (See  Comments)   "messes me up"   Codeine Nausea Only      Medication List       Accurate as of April 09, 2019 11:59 PM. If you have any questions, ask your nurse or doctor.        Adult Aspirin Low Strength 81 MG EC tablet Generic drug: Aspirin Take 81 mg by mouth every evening.   AMBULATORY NON FORMULARY MEDICATION Prosthetic Leg Liners   diltiazem 240 MG 24 hr capsule Commonly known as: TIAZAC TAKE 1 CAPSULE BY MOUTH EVERY DAY   GLUCOSAMINE PO Take 1 tablet by mouth every evening.   isosorbide mononitrate 60 MG 24 hr tablet Commonly known as: IMDUR TAKE 1 AND 1/2 TABLETS(90 MG) BY MOUTH DAILY   nitroGLYCERIN 0.4 MG SL tablet Commonly known as: Nitrostat PLACE 1 TABLET (0.4 MG TOTAL) UNDER THE TONGUE EVERY 5 (FIVE) MINUTES AS NEEDED FOR CHEST PAIN.   omeprazole 20 MG capsule Commonly known as: PRILOSEC TAKE 1 CAPSULE BY MOUTH TWICE DAILY What changed: when to take this   potassium citrate 10 MEQ (1080 MG) SR tablet Commonly known as: UROCIT-K TAKE 2 TABLETS BY MOUTH EVERY DAY   rosuvastatin 40 MG tablet Commonly known as: CRESTOR TAKE 1 TABLET(40 MG) BY MOUTH DAILY   Shingrix injection Generic drug: Zoster Vaccine Adjuvanted Inject 0.5 mLs into the muscle once for 1 dose. Started by: Kathlene November, MD           Objective:   Physical Exam BP 110/60 (BP Location: Left Arm, Patient Position: Sitting, Cuff Size: Normal)   Pulse (!) 51   Temp (!) 95.7 F (35.4 C) (Temporal)   Resp 16   Ht 5' 4.5" (1.638 m)   Wt 193 lb (87.5 kg)   SpO2 96%   BMI 32.62 kg/m  General: Well developed, NAD, BMI noted Neck: No  thyromegaly  HEENT:  Normocephalic . Face symmetric, atraumatic Lungs:  CTA B Normal respiratory effort, no intercostal retractions, no accessory muscle use. Heart: RRR,  no murmur.  Abdomen:  Not distended, soft, non-tender. No rebound or rigidity.   Skin: Exposed areas without rash. Not pale. Not jaundice DRE: Declined Neurologic:  alert &  oriented X3.  Speech normal, gait consistent with history  of previous BKA Psych: Cognition and judgment appear intact.  Cooperative with normal attention span and concentration.  Behavior appropriate. No anxious or depressed appearing.     Assessment     Assessment Hyperglycemia: A1c 5.01 February 2016 HTN Hyperlipidemia GERD , sporadic sx  Obesity  OSA, + CPAP CAD: NSTEMI 2005, stent. 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 2008 --meningococcal  2008, menactra booster 2017 L LE amputation d/t  trauma Cholelithiasis per ultrasound 09-2015 H/o Urolithiasis Vit D def Vit B 12 def   PLAN: Here for CPX Chronic medical problems controlled, good med compliance Hyperlipidemia: Check FLP Vitamin D deficiency: S/P ergocalciferol, not taking OTCs, recheck.  Consider daily vitamin D B12 deficiency: On oral supplements, 1000 milligrams daily.  Checking labs.  Consider injections Fatigue : Much improved since the last visit, patient is not sure if it is because he has lost weight or vitamin supplements. RTC 6 months

## 2019-04-10 NOTE — Assessment & Plan Note (Signed)
Here for CPX Chronic medical problems controlled, good med compliance Hyperlipidemia: Check FLP Vitamin D deficiency: S/P ergocalciferol, not taking OTCs, recheck.  Consider daily vitamin D B12 deficiency: On oral supplements, 1000 milligrams daily.  Checking labs.  Consider injections Fatigue : Much improved since the last visit, patient is not sure if it is because he has lost weight or vitamin supplements. RTC 6 months

## 2019-04-16 ENCOUNTER — Encounter: Payer: Self-pay | Admitting: Internal Medicine

## 2019-04-30 ENCOUNTER — Encounter: Payer: Self-pay | Admitting: Internal Medicine

## 2019-04-30 ENCOUNTER — Telehealth: Payer: Self-pay

## 2019-04-30 DIAGNOSIS — H5347 Heteronymous bilateral field defects: Secondary | ICD-10-CM

## 2019-04-30 NOTE — Telephone Encounter (Signed)
Devin Martin with [facility] called back to check status of request for notes from Dr Larose Kells to Dr Daryel Gerald. Asking for notes to be faxed to fax# [phone] ststes that this is urgent per Dr Jerline Pain

## 2019-04-30 NOTE — Telephone Encounter (Signed)
Spoke with Dr. Jerline Pain, the patient has left inferior hemianopsia, right occipital lobe lesion needs to be ruled out. This was actually picked up on a screening, he is asymptomatic. Plan: Please arrange for a brain  MRI, DX left inferior hemianopsia Will forward results to Dr. Jerline Pain

## 2019-04-30 NOTE — Telephone Encounter (Signed)
Last OV note faxed. Note sent to PCP about calling back.

## 2019-04-30 NOTE — Telephone Encounter (Addendum)
Spoke w/ Pt- he is in agreement, MRI ordered. Per Pt- he does not have issues w/ tight spaces.

## 2019-04-30 NOTE — Telephone Encounter (Signed)
Copied from [place] [id number]. Topic: Medical Record Request - Provider/Facility Request >> Apr 30, 2019 12:15 PM Erick Blinks wrote: Lanelle Bal from [facility], called on behalf of Dr. Daryel Gerald requesting notes from last visit and also call back from PCP/clinical staff with questions. Please advise. Call back preferred between 1 pm - 3 pm if possible. Fax: [phone]

## 2019-05-05 ENCOUNTER — Encounter: Payer: Self-pay | Admitting: Internal Medicine

## 2019-05-05 ENCOUNTER — Other Ambulatory Visit: Payer: Self-pay

## 2019-05-05 DIAGNOSIS — H5347 Heteronymous bilateral field defects: Secondary | ICD-10-CM

## 2019-05-10 ENCOUNTER — Telehealth: Payer: Self-pay | Admitting: Internal Medicine

## 2019-05-10 NOTE — Telephone Encounter (Signed)
Gwen- can you check on MRI that was ordered please? da

## 2019-05-10 NOTE — Telephone Encounter (Signed)
Sonia Baller calling from Dr. Jerline Pain OD calling to ask has Dr. Larose Kells ever ordered a MRI on the patient? And what was date the last MRI ordered? Please advise [phone]

## 2019-05-11 ENCOUNTER — Other Ambulatory Visit: Payer: Self-pay | Admitting: Internal Medicine

## 2019-05-11 ENCOUNTER — Encounter: Payer: Self-pay | Admitting: Internal Medicine

## 2019-05-11 DIAGNOSIS — H26493 Other secondary cataract, bilateral: Secondary | ICD-10-CM | POA: Diagnosis not present

## 2019-05-11 DIAGNOSIS — H5347 Heteronymous bilateral field defects: Secondary | ICD-10-CM

## 2019-05-11 DIAGNOSIS — Z961 Presence of intraocular lens: Secondary | ICD-10-CM | POA: Diagnosis not present

## 2019-05-11 NOTE — Telephone Encounter (Signed)
Please Call [facility] office yesterday Sonia Baller re to this pt, states no call back yesterday re MRI  [phone]

## 2019-05-11 NOTE — Telephone Encounter (Signed)
MRI scheduled 05/28/2019.

## 2019-05-11 NOTE — Telephone Encounter (Signed)
Per GI pt would be contacted today to schedule

## 2019-05-11 NOTE — Telephone Encounter (Signed)
Gwen- can you check status of MRI order?

## 2019-05-21 ENCOUNTER — Other Ambulatory Visit: Payer: Self-pay | Admitting: Internal Medicine

## 2019-05-27 ENCOUNTER — Telehealth: Payer: Self-pay

## 2019-05-27 ENCOUNTER — Other Ambulatory Visit: Payer: Self-pay | Admitting: Internal Medicine

## 2019-05-27 NOTE — Telephone Encounter (Signed)
Copied from [place] [phone]. Topic: General - Inquiry >> May 27, 2019  3:22 PM Richardo Priest, NT wrote: Reason for CRM: Patient called in stating he is needing a script for his artificial leg. Patient is needing script to state this: replacement of prosthetic foot shell for left transtibial prosthesis. 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 Fax to [phone]: Attention Joann Toller, to [facility] in [place]. Please advise.

## 2019-05-27 NOTE — Telephone Encounter (Signed)
Mychart message sent to Pt informing him to have [facility] send [place] a prescription for Dr. Larose Kells to sign.

## 2019-05-28 ENCOUNTER — Ambulatory Visit
Admission: RE | Admit: 2019-05-28 | Discharge: 2019-05-28 | Disposition: A | Discharge status: Home / Self Care | Payer: PPO | Source: Ambulatory Visit | Attending: Internal Medicine | Admitting: Internal Medicine

## 2019-05-28 DIAGNOSIS — H5347 Heteronymous bilateral field defects: Secondary | ICD-10-CM

## 2019-05-28 DIAGNOSIS — H53469 Homonymous bilateral field defects, unspecified side: Secondary | ICD-10-CM | POA: Diagnosis not present

## 2019-05-28 MED ORDER — GADOBENATE DIMEGLUMINE 529 MG/ML IV SOLN
17.0000 mL | Freq: Once | INTRAVENOUS | Status: AC | PRN
  Administered 2019-05-28: 17 mL via INTRAVENOUS

## 2019-05-28 NOTE — Telephone Encounter (Addendum)
Prescription received from [facility]- PCP signature stamped and faxed back to [phone]. Form sent for scanning. Fax confirmation received.

## 2019-05-31 ENCOUNTER — Encounter: Payer: Self-pay | Admitting: Internal Medicine

## 2019-06-01 ENCOUNTER — Encounter: Payer: Self-pay | Admitting: Internal Medicine

## 2019-06-01 ENCOUNTER — Ambulatory Visit (INDEPENDENT_AMBULATORY_CARE_PROVIDER_SITE_OTHER): Payer: PPO | Admitting: Internal Medicine

## 2019-06-01 DIAGNOSIS — Z8673 Personal history of transient ischemic attack (TIA), and cerebral infarction without residual deficits: Secondary | ICD-10-CM
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
## 2019-06-03 DIAGNOSIS — Z8673 Personal history of transient ischemic attack (TIA), and cerebral infarction without residual deficits: Secondary | ICD-10-CM | POA: Insufficient documentation

## 2019-06-03 NOTE — Assessment & Plan Note (Signed)
Remote stroke: Last month, the patient went to the ophthalmologist, was DX with hemianopsia after we discussed the case with the ophthalmologist, a MRI was done, it showed remote infarct. I spoke with ophthalmology again today, the MRI finding explain his sxs. The patient had a number of questions about the remote  stroke, specifically he likes to know when that happened. I explained patient that is hard/impossible to determine. At this point the best treatment is to control his CV RFs which he is doing. I am not sure further evaluation (echocardiogram, carotid ultrasound) is necessary since he is doing clinically very well.  He agreed with me but asked me to notify the cardiologist about this new finding to see what he thinks.  We will send a message. Addendum: Cardiology will arrange for echo and carotid ultrasound All questions will answer to his satisfaction. Next follow-up with me should be in few months

## 2019-06-11 ENCOUNTER — Encounter: Payer: Self-pay | Admitting: Internal Medicine

## 2019-06-14 DIAGNOSIS — Z89512 Acquired absence of left leg below knee: Secondary | ICD-10-CM | POA: Diagnosis not present

## 2019-08-09 DIAGNOSIS — G4733 Obstructive sleep apnea (adult) (pediatric): Secondary | ICD-10-CM | POA: Diagnosis not present

## 2019-08-11 ENCOUNTER — Encounter: Payer: Self-pay | Admitting: Internal Medicine

## 2019-08-30 ENCOUNTER — Other Ambulatory Visit: Payer: Self-pay | Admitting: *Deleted

## 2019-08-30 MED ORDER — DILTIAZEM HCL ER BEADS 240 MG PO CP24: ORAL_CAPSULE | ORAL | 3 refills | Status: DC

## 2019-09-24 ENCOUNTER — Other Ambulatory Visit: Payer: Self-pay | Admitting: *Deleted

## 2019-09-24 MED ORDER — ROSUVASTATIN CALCIUM 40 MG PO TABS: ORAL_TABLET | ORAL | 3 refills | Status: DC

## 2019-10-10 ENCOUNTER — Encounter: Payer: Self-pay | Admitting: Internal Medicine

## 2019-10-10 NOTE — Progress Notes (Signed)
Virtual Visit via Audio Note  I connected with patient on 10/12/19 at 11:45 AM EST by audio enabled telemedicine application and verified that I am speaking with the correct person using two identifiers.   THIS ENCOUNTER IS A VIRTUAL VISIT DUE TO COVID-19 - PATIENT WAS NOT SEEN IN THE OFFICE. PATIENT HAS CONSENTED TO VIRTUAL VISIT / TELEMEDICINE VISIT   Location of patient: home  Location of provider: office  I discussed the limitations of evaluation and management by telemedicine and the availability of in person appointments. The patient expressed understanding and agreed to proceed.   Subjective:   Devin Martin is a 74 y.o. male who presents for Medicare Annual/Subsequent preventive examination.  Review of Systems:  Home Safety/Smoke Alarms: Feels safe in home. Smoke alarms in place.  Lives w/ wife in ranch style home.  Male:   CCS- next due 09/08/2024     PSA-  Lab Results  Component Value Date   PSA 0.79 04/09/2019   PSA 0.90 02/06/2016   PSA 0.75 01/29/2013       Objective:    Vitals: Unable to assess. This visit is enabled though telemedicine due to Covid 19.   Advanced Directives 10/12/2019 10/09/2018 07/19/2018 11/25/2016 09/24/2016 02/21/2016 09/22/2015  Does Patient Have a Medical Advance Directive? Yes Yes No Yes Yes No Yes  Type of Paramedic of [place];Living will [place];Living will - - [place];Living will - [place];Living will  Does patient want to make changes to medical advance directive? No - Patient declined No - Patient declined - - - - No - Patient declined  Copy of [place] in Chart? No - copy requested Yes - validated most recent copy scanned in chart (See row information) - - No - copy requested - No - copy requested  Pre-existing out of facility DNR order (yellow form or pink MOST form) - - - - - - -    Tobacco Social History   Tobacco Use   Smoking Status Former Smoker  . Packs/day: 0.75  . Years: 20.00  . Pack years: 15.00  . Types: Cigarettes, E-cigarettes  Smokeless Tobacco Never Used  Tobacco Comment   currently doing some e-cigarrets       Counseling given: Not Answered Comment: currently doing some e-cigarrets     Clinical Intake: Pain : No/denies pain   Past Medical History:  Diagnosis Date  . Arthritis 1968   pt wears a lt bk prosthesis  . Bronchitis, chronic ([place])   . CAD (coronary artery disease)    Non-ST segment elevation myocardial infarction in 2005 with drug-eluting stent to the left anterior descending. 08-2009 - non-ST segment elevation myocardial infarction, felt secondary to vasospasm.  . Calculus, kidney    hx  . Complication of anesthesia    itching after preop med  ?name - 2013, no SOB  . Glaucoma   . Hyperlipidemia   . Hypertension   . Myocardial infarction [facility]) 2005   x2, 2005, 2010  . OSA (obstructive sleep apnea)    AHI 68/hr in 2002-uses a cpap  . Pneumonia    hx of   . Sleep apnea    wears CPAP  . Wears glasses    Past Surgical History:  Procedure Laterality Date  . 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 CARDIAC CATHETERIZATION    . CYST REMOVAL TRUNK  09/04/2012   Procedure: CYST REMOVAL TRUNK;  Surgeon: Ralene Ok, MD;  Location: [place];  Service: General;  Laterality: N/A;  EXCISION OF MID BACK MASS  . EYE SURGERY Left DR.[place]   CATARACT SX1/2019  . INCISION AND DRAINAGE ABSCESS Left 07/31/2013   Procedure: INCISIONAL/NON-INCISIONAL DEBRIDEMENT OF ABSCESS LEFT KNEE;  Surgeon: Mauri Pole, MD;  [facility];  Service: Orthopedics;  Laterality: Left;  . KIDNEY STONE SURGERY     multiple episodes   . Right Knee Arthroscopy  2010   [facility]  . TONSILLECTOMY     Family History  Problem Relation Age of Onset  . Heart attack Father 16  . Heart attack Paternal Grandfather   . Cancer Mother        "male ca"  . Colon cancer  Neg Hx   . Prostate cancer Neg Hx   . Diabetes Neg Hx   . Esophageal cancer Neg Hx   . Rectal cancer Neg Hx   . Stomach cancer Neg Hx    Social History   Socioeconomic History  . Marital status: Married    Spouse name: Not on file  . Number of children: 2  . Years of education: Not on file  . Highest education level: Not on file  Occupational History  . Occupation: Therapist, sports-- RETIRED 2012  Tobacco Use  . Smoking status: Former Smoker    Packs/day: 0.75    Years: 20.00    Pack years: 15.00    Types: Cigarettes, E-cigarettes  . Smokeless tobacco: Never Used  . Tobacco comment: currently doing some e-cigarrets    Substance and Sexual Activity  . Alcohol use: Yes    Comment: bourbon 3 fingers 3 x/week.  . Drug use: No  . Sexual activity: Not Currently  Other Topics Concern  . Not on file  Social History Narrative   Lives w/ wife    Social Determinants of Health   Financial Resource Strain:   . Difficulty of Paying Living Expenses: Not on file  Food Insecurity:   . Worried About Charity fundraiser in the Last Year: Not on file  . Ran Out of Food in the Last Year: Not on file  Transportation Needs:   . Lack of Transportation (Medical): Not on file  . Lack of Transportation (Non-Medical): Not on file  Physical Activity:   . Days of Exercise per Week: Not on file  . Minutes of Exercise per Session: Not on file  Stress:   . Feeling of Stress : Not on file  Social Connections:   . Frequency of Communication with Friends and Family: Not on file  . Frequency of Social Gatherings with Friends and Family: Not on file  . Attends Religious Services: Not on file  . Active Member of Clubs or Organizations: Not on file  . Attends Archivist Meetings: Not on file  . Marital Status: Not on file    Outpatient Encounter Medications as of 10/12/2019  Medication Sig  . AMBULATORY NON FORMULARY MEDICATION Prosthetic Leg Liners  . Aspirin (ADULT ASPIRIN LOW  STRENGTH) 81 MG EC tablet Take 81 mg by mouth every evening.   . Cholecalciferol (D3 ADULT PO) Take by mouth.  . Cyanocobalamin (B-12 PO) Take by mouth.  . diltiazem (TIAZAC) 240 MG 24 hr capsule TAKE 1 CAPSULE BY MOUTH EVERY DAY  . Glucosamine HCl (GLUCOSAMINE PO) Take 1 tablet by mouth every evening.   . isosorbide mononitrate (IMDUR) 60 MG 24 hr tablet TAKE 1 AND 1/2 TABLETS(90  MG) BY MOUTH DAILY  . omeprazole (PRILOSEC) 20 MG capsule Take 1 capsule (20 mg total) by mouth 2 (two) times daily. Patient needs office visit for further refills  . potassium citrate (UROCIT-K) 10 MEQ (1080 MG) SR tablet TAKE 2 TABLETS BY MOUTH EVERY DAY  . rosuvastatin (CRESTOR) 40 MG tablet TAKE 1 TABLET(40 MG) BY MOUTH DAILY  . nitroGLYCERIN (NITROSTAT) 0.4 MG SL tablet PLACE 1 TABLET (0.4 MG TOTAL) UNDER THE TONGUE EVERY 5 (FIVE) MINUTES AS NEEDED FOR CHEST PAIN. (Patient not taking: Reported on 10/12/2019)   No facility-administered encounter medications on file as of 10/12/2019.    Activities of Daily Living In your present state of health, do you have any difficulty performing the following activities: 10/12/2019 10/12/2019  Hearing? N N  Vision? N N  Difficulty concentrating or making decisions? N N  Walking or climbing stairs? N N  Dressing or bathing? N N  Doing errands, shopping? N N  Preparing Food and eating ? N -  Using the Toilet? N -  In the past six months, have you accidently leaked urine? N -  Do you have problems with loss of bowel control? N -  Managing your Medications? N -  Managing your Finances? N -  Housekeeping or managing your Housekeeping? N -  Some recent data might be hidden    Patient Care Team: Colon Branch, MD as PCP - General (Internal Medicine) [name], Denice Bors, MD as Consulting Physician (Cardiology) Iona Beard, [place] as Referring Physician (Optometry) Orie Rout as Referring Physician (Dentistry) Paralee Cancel, MD as Consulting Physician (Orthopedic Surgery) Fanny Skates, MD as Consulting Physician (General Surgery) Shawnie Dapper, DO as Consulting Physician (Ophthalmology)   Assessment:   This is a routine wellness examination for [place]. Physical assessment deferred to PCP.  Exercise Activities and Dietary recommendations Current Exercise Habits: Home exercise routine, Type of exercise: strength training/weights;stretching;walking, Time (Minutes): > 60, Frequency (Times/Week): 7, Weekly Exercise (Minutes/Week): 0, Intensity: Mild, Exercise limited by: None identified   Diet (meal preparation, eat out, water intake, caffeinated beverages, dairy products, fruits and vegetables): in general, a "healthy" diet  , well balanced    Goals    . Maintain healthy active lifestyle. (pt-stated)    . Weight (lb) < 190 lb (86.2 kg)       Fall Risk Fall Risk  10/12/2019 10/09/2018 09/26/2017 09/24/2016 08/07/2016  Falls in the past year? 0 0 No No No  Number falls in past yr: 0 - - - -  Injury with Fall? 0 - - - -  Risk for fall due to : - - - - -  Risk for fall due to: Comment - - - - -  Follow up Falls evaluation completed - - - -   Depression Screen PHQ 2/9 Scores 10/12/2019 10/09/2018 09/26/2017 09/24/2016  PHQ - 2 Score 0 0 0 0    Cognitive Function Ad8 score reviewed for issues:  Issues making decisions:no  Less interest in hobbies / activities:no  Repeats questions, stories (family complaining):no  Trouble using ordinary gadgets (microwave, computer, phone):no  Forgets the month or year: no  Mismanaging finances: no  Remembering appts:no  Daily problems with thinking and/or memory:no Ad8 score is=0   MMSE - Mini Mental State Exam 09/26/2017 09/22/2015  Orientation to time 5 5  Orientation to Place 5 5  Registration 3 3  Attention/ Calculation 5 5  Recall 3 3  Language- name 2 objects 2 2  Language- repeat 1  1  Language- follow 3 step command 3 3  Language- read & follow direction 1 1  Write a sentence 1 1  Copy design 1 1  Total  score 30 30        Immunization History  Administered Date(s) Administered  . Fluad Quad(high Dose 65+) 06/04/2019  . HiB (PRP-OMP) 06/23/2007  . Influenza Split 08/15/2011  . Influenza, High Dose Seasonal PF 08/07/2016, 09/26/2017  . Influenza,inj,Quad PF,6+ Mos 06/06/2014, 09/22/2015  . Influenza-Unspecified 05/30/2018  . Meningococcal B, OMV 01/16/2017, 03/21/2017  . Meningococcal Conjugate 02/06/2016  . Meningococcal Polysaccharide 06/23/2007  . Pneumococcal Conjugate-13 09/22/2015  . Pneumococcal Polysaccharide-23 01/29/2013  . Td 08/26/2006, 09/26/2017  . Zoster 02/06/2016   Screening Tests Health Maintenance  Topic Date Due  . COLONOSCOPY  09/08/2024  . TETANUS/TDAP  09/27/2027  . INFLUENZA VACCINE  Completed  . Hepatitis C Screening  Completed  . PNA vac Low Risk Adult  Completed  . Meningococcal B Vaccine  Discontinued     Plan:   See you next year!  Continue to eat heart healthy diet (full of fruits, vegetables, whole grains, lean protein, water--limit salt, fat, and sugar intake) and increase physical activity as tolerated.  Continue doing brain stimulating activities (puzzles, reading, adult coloring books, staying active) to keep memory sharp.   Bring a copy of your living will and/or healthcare power of attorney to your next office visit.   I have personally reviewed and noted the following in the patient's chart:   . Medical and social history . Use of alcohol, tobacco or illicit drugs  . Current medications and supplements . Functional ability and status . Nutritional status . Physical activity . Advanced directives . List of other physicians . Hospitalizations, surgeries, and ER visits in previous 12 months . Vitals . Screenings to include cognitive, depression, and falls . Referrals and appointments  In addition, I have reviewed and discussed with patient certain preventive protocols, quality metrics, and best practice recommendations. A  written personalized care plan for preventive services as well as general preventive health recommendations were provided to patient.     Shela Nevin, [place]  10/12/2019

## 2019-10-11 ENCOUNTER — Encounter: Payer: Self-pay | Admitting: Internal Medicine

## 2019-10-11 ENCOUNTER — Telehealth: Payer: Self-pay

## 2019-10-11 NOTE — Telephone Encounter (Signed)
Rx for Prosthetic supplies received- signed and faxed back to [facility] at [phone]. Form sent for scanning. Received fax confirmation.

## 2019-10-12 ENCOUNTER — Other Ambulatory Visit: Payer: Self-pay

## 2019-10-12 ENCOUNTER — Ambulatory Visit (INDEPENDENT_AMBULATORY_CARE_PROVIDER_SITE_OTHER): Payer: PPO | Admitting: *Deleted

## 2019-10-12 ENCOUNTER — Telehealth: Payer: Self-pay

## 2019-10-12 ENCOUNTER — Encounter: Payer: Self-pay | Admitting: *Deleted

## 2019-10-12 ENCOUNTER — Encounter: Payer: Self-pay | Admitting: Internal Medicine

## 2019-10-12 ENCOUNTER — Ambulatory Visit (INDEPENDENT_AMBULATORY_CARE_PROVIDER_SITE_OTHER): Payer: PPO | Admitting: Internal Medicine

## 2019-10-12 VITALS — BP 122/53 | HR 50 | Temp 97.4°F | Resp 16 | Ht 65.0 in | Wt 191.1 lb

## 2019-10-12 DIAGNOSIS — I10 Essential (primary) hypertension: Secondary | ICD-10-CM | POA: Diagnosis not present

## 2019-10-12 DIAGNOSIS — Z0000 Encounter for general adult medical examination without abnormal findings: Secondary | ICD-10-CM

## 2019-10-12 DIAGNOSIS — E7800 Pure hypercholesterolemia, unspecified: Secondary | ICD-10-CM

## 2019-10-12 DIAGNOSIS — R739 Hyperglycemia, unspecified: Secondary | ICD-10-CM | POA: Diagnosis not present

## 2019-10-12 DIAGNOSIS — E559 Vitamin D deficiency, unspecified: Secondary | ICD-10-CM

## 2019-10-12 LAB — COMPREHENSIVE METABOLIC PANEL
ALT: 23 U/L (ref 0–53)
AST: 17 U/L (ref 0–37)
Albumin: 4.1 g/dL (ref 3.5–5.2)
Alkaline Phosphatase: 71 U/L (ref 39–117)
BUN: 17 mg/dL (ref 6–23)
CO2: 31 mEq/L (ref 19–32)
Calcium: 9.2 mg/dL (ref 8.4–10.5)
Chloride: 103 mEq/L (ref 96–112)
Creatinine, Ser: 0.84 mg/dL (ref 0.40–1.50)
GFR: 89.85 mL/min (ref >60.00)
Glucose, Bld: 105 mg/dL — ABNORMAL HIGH (ref 70–99)
Potassium: 4.8 mEq/L (ref 3.5–5.1)
Sodium: 138 mEq/L (ref 135–145)
Total Bilirubin: 0.6 mg/dL (ref 0.2–1.2)
Total Protein: 6 g/dL (ref 6.0–8.3)

## 2019-10-12 LAB — LIPID PANEL
Cholesterol: 121 mg/dL (ref 0–200)
HDL: 58.3 mg/dL (ref >39.00)
LDL Cholesterol: 51 mg/dL (ref 0–99)
NonHDL: 62.74
Total CHOL/HDL Ratio: 2
Triglycerides: 60 mg/dL (ref 0.0–149.0)
VLDL: 12 mg/dL (ref 0.0–40.0)

## 2019-10-12 LAB — VITAMIN D 25 HYDROXY (VIT D DEFICIENCY, FRACTURES): VITD: 30.95 ng/mL (ref 30.00–100.00)

## 2019-10-12 LAB — HEMOGLOBIN A1C: Hgb A1c MFr Bld: 6.2 % (ref 4.6–6.5)

## 2019-10-12 NOTE — Patient Instructions (Signed)
GO TO THE LAB : Get the blood work     GO TO [place] back for a a physical exam is 6 to 7 months, please make an appointment

## 2019-10-12 NOTE — Telephone Encounter (Signed)
Form received and faxed back to [facility] w/ OV notes from today 10/12/2019 to [phone]. Form sent for scanning. Received fax confirmation.

## 2019-10-12 NOTE — Progress Notes (Signed)
Subjective:    Patient ID: Devin Martin, male    DOB: 10-13-1945, 74 y.o.   MRN: [id number]  DOS:  10/12/2019 Type of visit - description: Routine visit Since the last office visit, he changed his lifestyle and is feeling great, in his own scales has lost 13 pounds. Increased energy. We also addressed his blood pressure, cholesterol, hyperglycemia & history of remote stroke.  Wt Readings from Last 3 Encounters:  10/12/19 191 lb 2 oz (86.7 kg)  04/09/19 193 lb (87.5 kg)  03/25/19 191 lb (86.6 kg)     Review of Systems No chest pain no difficulty breathing  Past Medical History:  Diagnosis Date  . Arthritis 1968   pt wears a lt bk prosthesis  . Bronchitis, chronic ([place])   . CAD (coronary artery disease)    Non-ST segment elevation myocardial infarction in 2005 with drug-eluting stent to the left anterior descending. 08-2009 - non-ST segment elevation myocardial infarction, felt secondary to vasospasm.  . Calculus, kidney    hx  . Complication of anesthesia    itching after preop med  ?name - 2013, no SOB  . Glaucoma   . Hyperlipidemia   . Hypertension   . Myocardial infarction [facility]) 2005   x2, 2005, 2010  . OSA (obstructive sleep apnea)    AHI 68/hr in 2002-uses a cpap  . Pneumonia    hx of   . Sleep apnea    wears CPAP  . Wears glasses     Past Surgical History:  Procedure Laterality Date  . 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 CARDIAC CATHETERIZATION    . CYST REMOVAL TRUNK  09/04/2012   Procedure: CYST REMOVAL TRUNK;  Surgeon: Ralene Ok, MD;  Location: [place];  Service: General;  Laterality: N/A;  EXCISION OF MID BACK MASS  . EYE SURGERY Left DR.[place]   CATARACT SX1/2019  . INCISION AND DRAINAGE ABSCESS Left 07/31/2013   Procedure: INCISIONAL/NON-INCISIONAL DEBRIDEMENT OF ABSCESS LEFT KNEE;  Surgeon: Mauri Pole, MD;  [facility];  Service: Orthopedics;  Laterality: Left;  . KIDNEY STONE SURGERY     multiple  episodes   . Right Knee Arthroscopy  2010   [facility]  . TONSILLECTOMY      Allergies as of 10/12/2019      Reactions   Penicillins Hives, Other (See Comments)   syncope   Tylox [oxycodone-acetaminophen] Other (See Comments)   "messes me up"   Codeine Nausea Only      Medication List       Accurate as of October 12, 2019  9:51 AM. If you have any questions, ask your nurse or doctor.        Adult Aspirin Low Strength 81 MG EC tablet Generic drug: Aspirin Take 81 mg by mouth every evening.   AMBULATORY NON FORMULARY MEDICATION Prosthetic Leg Liners   B-12 PO Take by mouth.   D3 ADULT PO Take by mouth.   diltiazem 240 MG 24 hr capsule Commonly known as: TIAZAC TAKE 1 CAPSULE BY MOUTH EVERY DAY   GLUCOSAMINE PO Take 1 tablet by mouth every evening.   isosorbide mononitrate 60 MG 24 hr tablet Commonly known as: IMDUR TAKE 1 AND 1/2 TABLETS(90 MG) BY MOUTH DAILY   nitroGLYCERIN 0.4 MG SL tablet Commonly known as: Nitrostat PLACE 1 TABLET (0.4 MG TOTAL) UNDER THE TONGUE EVERY 5 (FIVE) MINUTES AS NEEDED FOR CHEST PAIN.   omeprazole 20 MG capsule  Commonly known as: PRILOSEC Take 1 capsule (20 mg total) by mouth 2 (two) times daily. Patient needs office visit for further refills   potassium citrate 10 MEQ (1080 MG) SR tablet Commonly known as: UROCIT-K TAKE 2 TABLETS BY MOUTH EVERY DAY   rosuvastatin 40 MG tablet Commonly known as: CRESTOR TAKE 1 TABLET(40 MG) BY MOUTH DAILY             Objective:   Physical Exam BP (!) 122/53 (BP Location: Left Arm, Patient Position: Sitting, Cuff Size: Normal)   Pulse (!) 50   Temp (!) 97.4 F (36.3 C) (Temporal)   Resp 16   Ht 5\' 5"  (1.651 m)   Wt 191 lb 2 oz (86.7 kg)   SpO2 97%   BMI 31.80 kg/m  General:   Well developed, NAD, BMI noted. HEENT:  Normocephalic . Face symmetric, atraumatic Neck: No carotid bruit, normal carotid pulses Lungs:  CTA B Normal respiratory effort, no intercostal retractions,  no accessory muscle use. Heart: RRR,  no murmur.  Lower extremities: no pretibial edema bilaterally  Skin: Not pale. Not jaundice Neurologic:  alert & oriented X3.  Speech normal, gait is c/w previous amputation Psych--  Cognition and judgment appear intact.  Cooperative with normal attention span and concentration.  Behavior appropriate. No anxious or depressed appearing.      Assessment     Assessment Hyperglycemia: A1c 5.01 February 2016 HTN Hyperlipidemia GERD , sporadic sx  Obesity  OSA, + CPAP CAD: NSTEMI 2005, stent. 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  Check a CMP Hyperlipidemia: On Crestor, check FLP Obesity: Doing great with diet, exercise, has lost on his own scales 13 pounds.  Praised. CAD: Asymptomatic, plans to call cardiology Remote stroke per MRI: At this point, he feels very well, much less apprehensive about the diagnosis, we agree on focus on CV RF control , declined a  echo or a carotid ultrasound at this point. Vitamin B & D deficiency: On supplements, check a vitamin D level. 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  Prescription for a new liner provided  Preventive care: Had 2 Covid shots RTC 6 to 7 months CPX   This visit occurred during the SARS-CoV-2 public health emergency.  Safety protocols were in place, including screening questions prior to the  visit, additional usage of staff PPE, and extensive cleaning of exam room while observing appropriate contact time as indicated for disinfecting solutions.

## 2019-10-12 NOTE — Progress Notes (Signed)
Pre visit review using our clinic review tool, if applicable. No additional management support is needed unless otherwise documented below in the visit note. 

## 2019-10-12 NOTE — Patient Instructions (Signed)
See you next year!  Continue to eat heart healthy diet (full of fruits, vegetables, whole grains, lean protein, water--limit salt, fat, and sugar intake) and increase physical activity as tolerated.  Continue doing brain stimulating activities (puzzles, reading, adult coloring books, staying active) to keep memory sharp.   Bring a copy of your living will and/or healthcare power of attorney to your next office visit.   Devin Martin , Thank you for taking time to come for your Medicare Wellness Visit. I appreciate your ongoing commitment to your health goals. Please review the following plan we discussed and let me know if I can assist you in the future.   These are the goals we discussed: Goals    . Maintain healthy active lifestyle. (pt-stated)    . Weight (lb) < 190 lb (86.2 kg)       This is a list of the screening recommended for you and due dates:  Health Maintenance  Topic Date Due  . Colon Cancer Screening  09/08/2024  . Tetanus Vaccine  09/27/2027  . Flu Shot  Completed  .  Hepatitis C: One time screening is recommended by Center for Disease Control  (CDC) for  adults born from [birth year] through 1963.   Completed  . Pneumonia vaccines  Completed  . Meningococcal B Vaccine  Discontinued    Preventive Care 44 Years and Older, Male Preventive care refers to lifestyle choices and visits with your health care provider that can promote health and wellness. This includes:  A yearly physical exam. This is also called an annual well check.  Regular dental and eye exams.  Immunizations.  Screening for certain conditions.  Healthy lifestyle choices, such as diet and exercise. What can I expect for my preventive care visit? Physical exam Your health care provider will check:  Height and weight. These may be used to calculate body mass index (BMI), which is a measurement that tells if you are at a healthy weight.  Heart rate and blood pressure.  Your skin for abnormal  spots. Counseling Your health care provider may ask you questions about:  Alcohol, tobacco, and drug use.  Emotional well-being.  Home and relationship well-being.  Sexual activity.  Eating habits.  History of falls.  Memory and ability to understand (cognition).  Work and work Statistician. What immunizations do I need?  Influenza (flu) vaccine  This is recommended every year. Tetanus, diphtheria, and pertussis (Tdap) vaccine  You may need a Td booster every 10 years. Varicella (chickenpox) vaccine  You may need this vaccine if you have not already been vaccinated. Zoster (shingles) vaccine  You may need this after age 57. Pneumococcal conjugate (PCV13) vaccine  One dose is recommended after age 42. Pneumococcal polysaccharide (PPSV23) vaccine  One dose is recommended after age 49. Measles, mumps, and rubella (MMR) vaccine  You may need at least one dose of MMR if you were born in 1957 or later. You may also need a second dose. Meningococcal conjugate (MenACWY) vaccine  You may need this if you have certain conditions. Hepatitis A vaccine  You may need this if you have certain conditions or if you travel or work in places where you may be exposed to hepatitis A. Hepatitis B vaccine  You may need this if you have certain conditions or if you travel or work in places where you may be exposed to hepatitis B. Haemophilus influenzae type b (Hib) vaccine  You may need this if you have certain conditions. You may receive  vaccines as individual doses or as more than one vaccine together in one shot (combination vaccines). Talk with your health care provider about the risks and benefits of combination vaccines. What tests do I need? Blood tests  Lipid and cholesterol levels. These may be checked every 5 years, or more frequently depending on your overall health.  Hepatitis C test.  Hepatitis B test. Screening  Lung cancer screening. You may have this screening  every year starting at age 14 if you have a 30-pack-year history of smoking and currently smoke or have quit within the past 15 years.  Colorectal cancer screening. All adults should have this screening starting at age 82 and continuing until age 70. Your health care provider may recommend screening at age 1 if you are at increased risk. You will have tests every 1-10 years, depending on your results and the type of screening test.  Prostate cancer screening. Recommendations will vary depending on your family history and other risks.  Diabetes screening. This is done by checking your blood sugar (glucose) after you have not eaten for a while (fasting). You may have this done every 1-3 years.  Abdominal aortic aneurysm (AAA) screening. You may need this if you are a current or former smoker.  Sexually transmitted disease (STD) testing. Follow these instructions at home: Eating and drinking  Eat a diet that includes fresh fruits and vegetables, whole grains, lean protein, and low-fat dairy products. Limit your intake of foods with high amounts of sugar, saturated fats, and salt.  Take vitamin and mineral supplements as recommended by your health care provider.  Do not drink alcohol if your health care provider tells you not to drink.  If you drink alcohol: ? Limit how much you have to 0-2 drinks a day. ? Be aware of how much alcohol is in your drink. In the [place] one drink equals one 12 oz bottle of beer (355 mL), one 5 oz glass of wine (148 mL), or one 1 oz glass of hard liquor (44 mL). Lifestyle  Take daily care of your teeth and gums.  Stay active. Exercise for at least 30 minutes on 5 or more days each week.  Do not use any products that contain nicotine or tobacco, such as cigarettes, e-cigarettes, and chewing tobacco. If you need help quitting, ask your health care provider.  If you are sexually active, practice safe sex. Use a condom or other form of protection to prevent STIs  (sexually transmitted infections).  Talk with your health care provider about taking a low-dose aspirin or statin. What's next?  Visit your health care provider once a year for a well check visit.  Ask your health care provider how often you should have your eyes and teeth checked.  Stay up to date on all vaccines. This information is not intended to replace advice given to you by your health care provider. Make sure you discuss any questions you have with your health care provider. Document Revised: 08/06/2018 Document Reviewed: 08/06/2018 Elsevier Patient Education  2020 [employer].

## 2019-10-13 NOTE — Assessment & Plan Note (Signed)
Hyperglycemia: Doing great with lifestyle, check A1c HTN: Well-controlled on diltiazem and Imdur.  Check a CMP Hyperlipidemia: On Crestor, check FLP Obesity: Doing great with diet, exercise, has lost on his own scales 13 pounds.  Praised. CAD: Asymptomatic, plans to call cardiology Remote stroke per MRI: At this point, he feels very well, much less apprehensive about the diagnosis, we agree on focus on CV RF control , declined a  echo or a carotid ultrasound at this point. Vitamin B & D deficiency: On supplements, check a vitamin D level. 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  Prescription for a new liner provided  Preventive care: Had 2 Covid shots RTC 6 to 7 months CPX

## 2019-10-14 ENCOUNTER — Telehealth: Payer: Self-pay | Admitting: Internal Medicine

## 2019-10-14 NOTE — Progress Notes (Signed)
  Chronic Care Management   Outreach Note  10/14/2019 Name: Devin Martin MRN: [id number] DOB: 1946/07/08  Referred by: Colon Branch, MD Reason for referral : No chief complaint on file.   An unsuccessful telephone outreach was attempted today. The patient was referred to the pharmacist for assistance with care management and care coordination.   Follow Up Plan:   Raynicia Dukes [employer] Scheduler

## 2019-10-14 NOTE — Chronic Care Management (AMB) (Signed)
  Chronic Care Management   Note  10/14/2019 Name: Devin Martin MRN: [id number] DOB: [birth year]  Devin Martin is a 74 y.o. year old male who is a primary care patient of Colon Branch, MD. I reached out to Devin Martin by phone today in response to a referral sent by [name] PCP, Colon Branch, MD.   [name] was given information about Chronic Care Management services today including:  1. CCM service includes personalized support from designated clinical staff supervised by his physician, including individualized plan of care and coordination with other care providers 2. 24/7 contact phone numbers for assistance for urgent and routine care needs. 3. Service will only be billed when office clinical staff spend 20 minutes or more in a month to coordinate care. 4. Only one practitioner may furnish and bill the service in a calendar month. 5. The patient may stop CCM services at any time (effective at the end of the month) by phone call to the office staff. 6. The patient will be responsible for cost sharing (co-pay) of up to 20% of the service fee (after annual deductible is met).  Patient agreed to services and verbal consent obtained.   Follow up plan:   Raynicia Dukes [employer] Scheduler

## 2019-10-18 ENCOUNTER — Telehealth: Payer: Self-pay | Admitting: Internal Medicine

## 2019-10-18 ENCOUNTER — Encounter: Payer: Self-pay | Admitting: Internal Medicine

## 2019-10-18 NOTE — Telephone Encounter (Signed)
Form in PCP red folder for completion.

## 2019-10-18 NOTE — Telephone Encounter (Signed)
Pt dropped off document to be filled out by provider (disability Parking Placard 1 page) Please call pt when document ready at [facility] [phone]. Document put at front office tray under providers name.

## 2019-10-19 NOTE — Telephone Encounter (Signed)
Pt aware form placed at front desk for pick up. Copy of form sent for scanning.

## 2019-10-22 ENCOUNTER — Other Ambulatory Visit: Payer: Self-pay

## 2019-10-22 DIAGNOSIS — Z89512 Acquired absence of left leg below knee: Secondary | ICD-10-CM

## 2019-10-22 DIAGNOSIS — I10 Essential (primary) hypertension: Secondary | ICD-10-CM

## 2019-10-22 DIAGNOSIS — E7800 Pure hypercholesterolemia, unspecified: Secondary | ICD-10-CM

## 2019-10-22 DIAGNOSIS — G4733 Obstructive sleep apnea (adult) (pediatric): Secondary | ICD-10-CM

## 2019-10-27 ENCOUNTER — Other Ambulatory Visit: Payer: Self-pay

## 2019-10-27 ENCOUNTER — Ambulatory Visit: Payer: PPO | Admitting: Pharmacist

## 2019-10-27 DIAGNOSIS — H00024 Hordeolum internum left upper eyelid: Secondary | ICD-10-CM | POA: Diagnosis not present

## 2019-10-27 DIAGNOSIS — E559 Vitamin D deficiency, unspecified: Secondary | ICD-10-CM

## 2019-10-27 DIAGNOSIS — I2510 Atherosclerotic heart disease of native coronary artery without angina pectoris: Secondary | ICD-10-CM

## 2019-10-27 DIAGNOSIS — K219 Gastro-esophageal reflux disease without esophagitis: Secondary | ICD-10-CM

## 2019-10-27 DIAGNOSIS — R739 Hyperglycemia, unspecified: Secondary | ICD-10-CM

## 2019-10-27 DIAGNOSIS — I10 Essential (primary) hypertension: Secondary | ICD-10-CM

## 2019-10-27 DIAGNOSIS — G4733 Obstructive sleep apnea (adult) (pediatric): Secondary | ICD-10-CM

## 2019-10-27 DIAGNOSIS — E785 Hyperlipidemia, unspecified: Secondary | ICD-10-CM

## 2019-10-27 DIAGNOSIS — E538 Deficiency of other specified B group vitamins: Secondary | ICD-10-CM

## 2019-10-27 NOTE — Chronic Care Management (AMB) (Signed)
Chronic Care Management Pharmacy  Name: Devin Martin  MRN: [id number] DOB: [birth year]  Chief Complaint/ HPI  Devin Martin,  74 y.o. , male presents for their Initial CCM visit with the clinical pharmacist via telephone due to COVID-19 Pandemic.  PCP : Colon Branch, MD  Their chronic conditions include: Pre-DM, HTN, HLD, CAD, GERD, Vitamin D Deficiency, Vitamin B12 Deficiency, OSA  Office Visits: 10/12/19: Visit w/ Dr. Larose Kells - Pt reports 13lb weight loss and has increased energy. Labs ordered (cmp, a1c, lipids, vitaminD). Pt feels well and less apprehensive about dx of remote stroke. Declined echo and carotid US.   06/01/19: Visit w/ Dr. Larose Kells - MRI showed remote stroke, no major current concerns other than controlling for cardiovascular risk factors. Pt would like to discuss with cardio. Cardio to arrange for echo and carotid US.    Medications: Outpatient Encounter Medications as of 10/27/2019  Medication Sig Note  . AMBULATORY NON FORMULARY MEDICATION Prosthetic Leg Liners   . AMBULATORY NON FORMULARY MEDICATION ResMed CPAP Machine   . Aspirin (ADULT ASPIRIN LOW STRENGTH) 81 MG EC tablet Take 81 mg by mouth every evening.    . Cholecalciferol (D3 ADULT PO) Take 1,000 Units by mouth.    . Cyanocobalamin (B-12 PO) Take 1,000 mcg by mouth.    . diltiazem (TIAZAC) 240 MG 24 hr capsule TAKE 1 CAPSULE BY MOUTH EVERY [name]   . Glucosamine HCl (GLUCOSAMINE PO) Take 1 tablet by mouth every evening. 1500mg    . isosorbide mononitrate (IMDUR) 60 MG 24 hr tablet TAKE 1 AND 1/2 TABLETS(90 MG) BY MOUTH DAILY   . nitroGLYCERIN (NITROSTAT) 0.4 MG SL tablet PLACE 1 TABLET (0.4 MG TOTAL) UNDER THE TONGUE EVERY 5 (FIVE) MINUTES AS NEEDED FOR CHEST PAIN. 05/06/2016: PRN  . omeprazole (PRILOSEC) 20 MG capsule Take 1 capsule (20 mg total) by mouth 2 (two) times daily. Patient needs office visit for further refills (Patient taking differently: Take 20 mg by mouth daily. Patient needs office visit for further  refills) 10/27/2019: Taking once daily  . potassium citrate (UROCIT-K) 10 MEQ (1080 MG) SR tablet TAKE 2 TABLETS BY MOUTH EVERY [name]   . rosuvastatin (CRESTOR) 40 MG tablet TAKE 1 TABLET(40 MG) BY MOUTH DAILY    No facility-administered encounter medications on file as of 10/27/2019.   Immunization History  Administered Date(s) Administered  . Fluad Quad(high Dose 65+) 06/04/2019  . HiB (PRP-OMP) 06/23/2007  . Influenza Split 08/15/2011  . Influenza, High Dose Seasonal PF 08/07/2016, 09/26/2017  . Influenza,inj,Quad PF,6+ Mos 06/06/2014, 09/22/2015  . Influenza-Unspecified 05/30/2018  . Meningococcal B, OMV 01/16/2017, 03/21/2017  . Meningococcal Conjugate 02/06/2016  . Meningococcal Polysaccharide 06/23/2007  . Pneumococcal Conjugate-13 09/22/2015  . Pneumococcal Polysaccharide-23 01/29/2013  . Td 08/26/2006, 09/26/2017  . Zoster 02/06/2016   Received 2nd covid vaccine Levan Hurst)  Current Diagnosis/Assessment:  Goals Addressed            This Visit's Progress   . A1c goal <6.5%       -Pre-Diabetes a1c range is 5.7% to 6.4%. Your most recent a1c was 6.2% on 10/12/19.  -Usually the full diabetes diagnosis is given when a1c reaches 6.5% or higher.     . Blood pressure goal less than 140/90 and greater than 90/60      . Increase vitamin D to 2000 units daily      . LDL goal <70      . Pharmacy Care Plan       CARE PLAN  ENTRY  Current Barriers:  . Chronic Disease Management support, education, and care coordination needs related to Pre-DM, HTN, HLD, CAD, GERD, Vitamin D Deficiency, Vitamin B12 Deficiency, OSA  Pharmacist Clinical Goal(s):  Marland Kitchen Over the next 90 days, polypharmacy will be reduced as evidenced by tapering off of omeprazole pending patient toleration. . A1c goal <6.5% . BP goal <140/90 and >90/60 . LDL goal <70  Interventions: . Comprehensive medication review performed. . Start taking omeprazole every other [name] for 2 weeks . Replace apple juice with reduced  carb orange juice . Increase vitamin D to 2000 units daily  Patient Self Care Activities:  . Patient verbalizes understanding of plan to follow as described above, Self administers medications as prescribed, Calls pharmacy for medication refills, and Calls provider office for new concerns or questions  Initial goal documentation     . Replace apple juice with reduced carb orange juice      . Start taking omeprazole every other [name] with goal to taper off of medication        Social Hx: Married since 1974. 2 kids, 2 grandkids Uses pill box 2 weeks  Keeps pills in a big bag  Pre-Diabetes   Recent Relevant Labs: Lab Results  Component Value Date/Time   HGBA1C 6.2 10/12/2019 10:33 AM   HGBA1C 5.6 10/09/2018 09:38 AM     Patient is currently controlled on the following medications: None  Avoids carbohydrates Eats  1/2 glass of apple daily (27g for 8 oz) Reduced Carb Orange juice (13g for 8 oz) Exercises 1.5 hrs per [name] Plans to eat grilled blackened salmon on a salad tonight for dinner.   We discussed: Importance of limiting carbohydrates  Plan -Replace apple juice with reduced carb orange juice -Continue control with diet and exercise  Hypertension   CMP Latest Ref Rng & Units 10/12/2019 10/09/2018 03/26/2018  Glucose 70 - 99 mg/dL 105(H) 95 115(H)  BUN 6 - 23 mg/dL 17 15 16   Creatinine 0.40 - 1.50 mg/dL 0.84 0.78 0.83  Sodium 135 - 145 mEq/L 138 140 139  Potassium 3.5 - 5.1 mEq/L 4.8 4.1 4.2  Chloride 96 - 112 mEq/L 103 104 104  CO2 19 - 32 mEq/L 31 28 30   Calcium 8.4 - 10.5 mg/dL 9.2 9.0 9.0  Total Protein 6.0 - 8.3 g/dL 6.0 6.1 -  Total Bilirubin 0.2 - 1.2 mg/dL 0.6 1.3(H) -  Alkaline Phos 39 - 117 U/L 71 69 -  AST 0 - 37 U/L 17 17 -  ALT 0 - 53 U/L 23 24 -  GFR      89.95   98.15   97.25        BP today is:  Unable to assess due to phone visit   Office blood pressures are  BP Readings from Last 3 Encounters:  10/12/19 (!) 122/53  04/09/19 110/60  11/04/18  114/64    Patient has failed these meds in the past: None noted   Patient is currently controlled on the following medications: diltiazem 240mg  daily  Patient checks BP at home Never. Does not have BP cuff  Plan -Continue current medications   Hyperlipidemia   Lipid Panel     Component Value Date/Time   CHOL 121 10/12/2019 1033   TRIG 60.0 10/12/2019 1033   HDL 58.30 10/12/2019 1033   CHOLHDL 2 10/12/2019 1033   VLDL 12.0 10/12/2019 1033   LDLCALC 51 10/12/2019 1033   LDLDIRECT 150.1 06/24/2007 0900     ASCVD 10-year  risk: Hx of ASCVD   Patient has failed these meds in past: None noted Patient is currently controlled on the following medications: rosuvastatin 40mg  once daily  Plan -Continue current medications   CAD    Patient has failed these meds in past: None noted Patient is currently controlled on the following medications: aspirin 81mg  daily, isosorbide mononitrate 60mg  1.5 tabs daily, diltiazem 240mg  once daily  Has had multiple heart attacks  Denies current chest pain  Plan -Continue current medications      GERD    Patient has failed these meds in past: None noted Patient is currently controlled on the following medications: omeprazole 20mg  daily  Breakthrough Sx: None  Patient is interested in tapering off of omeprazole. Will follow patient for tapering plan.  We discussed:  risk/benefit of continuation and cessation of PPI drug therapy  Plan -Start taking omeprazole every other Elchonon Maxson x 2 weeks    Vitamin D Deficiency    10/12/19: Vitamin D = 30.95  Patient has failed these meds in past: None noted  Patient is currently borderline low on the following medications: vitamin D3 1000 units daily  Started taking about 6 months ago  Plan -Increase Vitamin D3 to 2000 units daily   Vitamin B12    04/09/19: Vitamin B12 = 539  Patient has failed these meds in past: None noted Patient is currently controlled on the following medications:   B12 1047mcg daily  Plan -Continue current medications   OSA    Patient has failed these meds in past: None noted Patient is currently controlled on the following medications: CPAP Machine  Uses CPAP  Plan -Continue current medications    Miscellanous Meds  Glucosamine Potassium citrate for kidney stones

## 2019-11-02 DIAGNOSIS — Z89512 Acquired absence of left leg below knee: Secondary | ICD-10-CM | POA: Diagnosis not present

## 2019-11-02 NOTE — Patient Instructions (Signed)
Visit Information  Goals Addressed            This Visit's Progress   . A1c goal <6.5%       -Pre-Diabetes a1c range is 5.7% to 6.4%. Your most recent a1c was 6.2% on 10/12/19.  -Usually the full diabetes diagnosis is given when a1c reaches 6.5% or higher.     . Blood pressure goal less than 140/90 and greater than 90/60      . Increase vitamin D to 2000 units daily      . LDL goal <70      . Pharmacy Care Plan       CARE PLAN ENTRY  Current Barriers:  . Chronic Disease Management support, education, and care coordination needs related to Pre-DM, HTN, HLD, CAD, GERD, Vitamin D Deficiency, Vitamin B12 Deficiency, OSA  Pharmacist Clinical Goal(s):  Marland Kitchen Over the next 90 days, polypharmacy will be reduced as evidenced by tapering off of omeprazole pending patient toleration. . A1c goal <6.5% . BP goal <140/90 and >90/60 . LDL goal <70  Interventions: . Comprehensive medication review performed. . Start taking omeprazole every other Tritia Endo for 2 weeks . Replace apple juice with reduced carb orange juice . Increase vitamin D to 2000 units daily  Patient Self Care Activities:  . Patient verbalizes understanding of plan to follow as described above, Self administers medications as prescribed, Calls pharmacy for medication refills, and Calls provider office for new concerns or questions  Initial goal documentation     . Replace apple juice with reduced carb orange juice      . Start taking omeprazole every other Adeleine Pask with goal to taper off of medication         Mr. Haroldson was given information about Chronic Care Management services today including:  1. CCM service includes personalized support from designated clinical staff supervised by his physician, including individualized plan of care and coordination with other care providers 2. 24/7 contact phone numbers for assistance for urgent and routine care needs. 3. Standard insurance, coinsurance, copays and deductibles apply for  chronic care management only during months in which we provide at least 20 minutes of these services. Most insurances cover these services at 100%, however patients may be responsible for any copay, coinsurance and/or deductible if applicable. This service may help you avoid the need for more expensive face-to-face services. 4. Only one practitioner may furnish and bill the service in a calendar month. 5. The patient may stop CCM services at any time (effective at the end of the month) by phone call to the office staff.  Patient agreed to services and verbal consent obtained.   The patient verbalized understanding of instructions provided today and agreed to receive a mailed copy of patient instruction and/or educational materials. The pharmacy team will reach out to the patient again over the next 14 days.   De Blanch, PharmD Clinical Pharmacist [facility] at [facility] [phone]   Prediabetes Eating Plan Prediabetes is a condition that causes blood sugar (glucose) levels to be higher than normal. This increases the risk for developing diabetes. In order to prevent diabetes from developing, your health care provider may recommend a diet and other lifestyle changes to help you:  Control your blood glucose levels.  Improve your cholesterol levels.  Manage your blood pressure. Your health care provider may recommend working with a diet and nutrition specialist (dietitian) to make a meal plan that is best for you. What are tips for  following this plan? Lifestyle  Set weight loss goals with the help of your health care team. It is recommended that most people with prediabetes lose 7% of their current body weight.  Exercise for at least 30 minutes at least 5 days a week.  Attend a support group or seek ongoing support from a mental health counselor.  Take over-the-counter and prescription medicines only as told by your health care provider. Reading food  labels  Read food labels to check the amount of fat, salt (sodium), and sugar in prepackaged foods. Avoid foods that have: ? Saturated fats. ? Trans fats. ? Added sugars.  Avoid foods that have more than 300 milligrams (mg) of sodium per serving. Limit your daily sodium intake to less than 2,300 mg each Devin Martin. Shopping  Avoid buying pre-made and processed foods. Cooking  Cook with olive oil. Do not use butter, lard, or ghee.  Bake, broil, grill, or boil foods. Avoid frying. Meal planning   Work with your dietitian to develop an eating plan that is right for you. This may include: ? Tracking how many calories you take in. Use a food diary, notebook, or mobile application to track what you eat at each meal. ? Using the glycemic index (GI) to plan your meals. The index tells you how quickly a food will raise your blood glucose. Choose low-GI foods. These foods take a longer time to raise blood glucose.  Consider following a Mediterranean diet. This diet includes: ? Several servings each Xavier Fournier of fresh fruits and vegetables. ? Eating fish at least twice a week. ? Several servings each Rahel Carlton of whole grains, beans, nuts, and seeds. ? Using olive oil instead of other fats. ? Moderate alcohol consumption. ? Eating small amounts of red meat and whole-fat dairy.  If you have high blood pressure, you may need to limit your sodium intake or follow a diet such as the DASH eating plan. DASH is an eating plan that aims to lower high blood pressure. What foods are recommended? The items listed below may not be a complete list. Talk with your dietitian about what dietary choices are best for you. Grains Whole grains, such as whole-wheat or whole-grain breads, crackers, cereals, and pasta. Unsweetened oatmeal. Bulgur. Barley. Quinoa. Brown rice. Corn or whole-wheat flour tortillas or taco shells. Vegetables Lettuce. Spinach. Peas. Beets. Cauliflower. Cabbage. Broccoli. Carrots. Tomatoes. Squash.  Eggplant. Herbs. Peppers. Onions. Cucumbers. Brussels sprouts. Fruits Berries. Bananas. Apples. Oranges. Grapes. Papaya. Mango. Pomegranate. Kiwi. Grapefruit. Cherries. Meats and other protein foods Seafood. Poultry without skin. Lean cuts of pork and beef. Tofu. Eggs. Nuts. Beans. Dairy Low-fat or fat-free dairy products, such as yogurt, cottage cheese, and cheese. Beverages Water. Tea. Coffee. Sugar-free or diet soda. Seltzer water. Lowfat or no-fat milk. Milk alternatives, such as soy or almond milk. Fats and oils Olive oil. Canola oil. Sunflower oil. Grapeseed oil. Avocado. Walnuts. Sweets and desserts Sugar-free or low-fat pudding. Sugar-free or low-fat ice cream and other frozen treats. Seasoning and other foods Herbs. Sodium-free spices. Mustard. Relish. Low-fat, low-sugar ketchup. Low-fat, low-sugar barbecue sauce. Low-fat or fat-free mayonnaise. What foods are not recommended? The items listed below may not be a complete list. Talk with your dietitian about what dietary choices are best for you. Grains Refined white flour and flour products, such as bread, pasta, snack foods, and cereals. Vegetables Canned vegetables. Frozen vegetables with butter or cream sauce. Fruits Fruits canned with syrup. Meats and other protein foods Fatty cuts of meat. Poultry with skin. Breaded  or fried meat. Processed meats. Dairy Full-fat yogurt, cheese, or milk. Beverages Sweetened drinks, such as sweet iced tea and soda. Fats and oils Butter. Lard. Ghee. Sweets and desserts Baked goods, such as cake, cupcakes, pastries, cookies, and cheesecake. Seasoning and other foods Spice mixes with added salt. Ketchup. Barbecue sauce. Mayonnaise. Summary  To prevent diabetes from developing, you may need to make diet and other lifestyle changes to help control blood sugar, improve cholesterol levels, and manage your blood pressure.  Set weight loss goals with the help of your health care team. It is  recommended that most people with prediabetes lose 7 percent of their current body weight.  Consider following a Mediterranean diet that includes plenty of fresh fruits and vegetables, whole grains, beans, nuts, seeds, fish, lean meat, low-fat dairy, and healthy oils. This information is not intended to replace advice given to you by your health care provider. Make sure you discuss any questions you have with your health care provider. Document Revised: 12/04/2018 Document Reviewed: 10/16/2016 Elsevier Patient Education  2020 [employer].

## 2019-11-09 ENCOUNTER — Other Ambulatory Visit: Payer: Self-pay | Admitting: Internal Medicine

## 2019-12-24 ENCOUNTER — Other Ambulatory Visit: Payer: Self-pay | Admitting: Cardiology

## 2020-04-03 NOTE — Progress Notes (Signed)
HPI: FU coronary artery disease. The patient underwent cardiac catheterization in December 2005 secondary to a non-ST elevation myocardial infarction. At that time, his ejection fraction was 60%. He had successful PCI of his mid LAD with a drug-eluting stent. Note, there was jailing of 2 small diagonals by the LAD stent. He underwent cardiac catheterization in January of 2011 following MI. This revealed an ejection fraction of 55-60%. The stent in the LAD was patent. The stent jails 2 diagonal branches, both diagonals have 90% ostial stenoses. There was a 25-30% circumflex. The right coronary artery had a 25-30% lesion. There was intense vasospasm and it was felt that his infarct was related to spasm. Abdominal ultrasound 2/17showed no aneurysm. Nuclear study 10/17 showed EF 52 with septal infarct and mild apical ischemia; treated medically.Since I last saw him,  patient denies dyspnea, chest pain, palpitations or syncope.  Current Outpatient Medications  Medication Sig Dispense Refill  . AMBULATORY NON FORMULARY MEDICATION Prosthetic Leg Liners 2 application 0  . AMBULATORY NON FORMULARY MEDICATION ResMed CPAP Machine    . Aspirin (ADULT ASPIRIN LOW STRENGTH) 81 MG EC tablet Take 81 mg by mouth every evening.     . Cholecalciferol (D3 ADULT PO) Take 1,000 Units by mouth.     . Cyanocobalamin (B-12 PO) Take 1,000 mcg by mouth.     . diltiazem (TIAZAC) 240 MG 24 hr capsule TAKE 1 CAPSULE BY MOUTH EVERY DAY 90 capsule 3  . Glucosamine HCl (GLUCOSAMINE PO) Take 1 tablet by mouth every evening. 1500mg     . isosorbide mononitrate (IMDUR) 60 MG 24 hr tablet TAKE 1 AND 1/2 TABLETS(90 MG) BY MOUTH DAILY 135 tablet 1  . nitroGLYCERIN (NITROSTAT) 0.4 MG SL tablet PLACE 1 TABLET (0.4 MG TOTAL) UNDER THE TONGUE EVERY 5 (FIVE) MINUTES AS NEEDED FOR CHEST PAIN. 25 tablet 11  . omeprazole (PRILOSEC) 20 MG capsule Take 1 capsule (20 mg total) by mouth 2 (two) times daily. Patient needs office visit for  further refills (Patient taking differently: Take 20 mg by mouth every other day. ) 180 capsule 0  . potassium citrate (UROCIT-K) 10 MEQ (1080 MG) SR tablet TAKE 2 TABLETS BY MOUTH DAILY 180 tablet 1  . rosuvastatin (CRESTOR) 40 MG tablet TAKE 1 TABLET(40 MG) BY MOUTH DAILY 90 tablet 3   No current facility-administered medications for this visit.     Past Medical History:  Diagnosis Date  . Arthritis 1968   pt wears a lt bk prosthesis  . Bronchitis, chronic ([place])   . CAD (coronary artery disease)    Non-ST segment elevation myocardial infarction in 2005 with drug-eluting stent to the left anterior descending. 08-2009 - non-ST segment elevation myocardial infarction, felt secondary to vasospasm.  . Calculus, kidney    hx  . Complication of anesthesia    itching after preop med  ?name - 2013, no SOB  . Glaucoma   . Hyperlipidemia   . Hypertension   . Myocardial infarction [facility]) 2005   x2, 2005, 2010  . OSA (obstructive sleep apnea)    AHI 68/hr in 2002-uses a cpap  . Pneumonia    hx of   . Sleep apnea    wears CPAP  . Wears glasses     Past Surgical History:  Procedure Laterality Date  . 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 CARDIAC CATHETERIZATION    . CYST REMOVAL TRUNK  09/04/2012   Procedure: CYST REMOVAL TRUNK;  Surgeon:  Ralene Ok, MD;  Location: [place];  Service: General;  Laterality: N/A;  EXCISION OF MID BACK MASS  . EYE SURGERY Left DR.[place]   CATARACT SX1/2019  . INCISION AND DRAINAGE ABSCESS Left 07/31/2013   Procedure: INCISIONAL/NON-INCISIONAL DEBRIDEMENT OF ABSCESS LEFT KNEE;  Surgeon: Mauri Pole, MD;  Location: [facility];  Service: Orthopedics;  Laterality: Left;  . KIDNEY STONE SURGERY     multiple episodes   . Right Knee Arthroscopy  2010   [facility]  . TONSILLECTOMY      Social History   Socioeconomic History  . Marital status: Married    Spouse name: Not on file  . Number of children: 2  . Years of education:  Not on file  . Highest education level: Not on file  Occupational History  . Occupation: Therapist, sports-- RETIRED 2012  Tobacco Use  . Smoking status: Former Smoker    Packs/day: 0.75    Years: 20.00    Pack years: 15.00    Types: Cigarettes, E-cigarettes  . Smokeless tobacco: Never Used  . Tobacco comment: currently doing some e-cigarrets    Vaping Use  . Vaping Use: Every day  Substance and Sexual Activity  . Alcohol use: Yes    Comment: bourbon 3 fingers 3 x/week.  . Drug use: No  . Sexual activity: Not Currently  Other Topics Concern  . Not on file  Social History Narrative   Lives w/ wife    Social Determinants of Health   Financial Resource Strain: Low Risk   . Difficulty of Paying Living Expenses: Not hard at all  Food Insecurity:   . Worried About Charity fundraiser in the Last Year: Not on file  . Ran Out of Food in the Last Year: Not on file  Transportation Needs: No Transportation Needs  . Lack of Transportation (Medical): No  . Lack of Transportation (Non-Medical): No  Physical Activity:   . Days of Exercise per Week: Not on file  . Minutes of Exercise per Session: Not on file  Stress:   . Feeling of Stress : Not on file  Social Connections:   . Frequency of Communication with Friends and Family: Not on file  . Frequency of Social Gatherings with Friends and Family: Not on file  . Attends Religious Services: Not on file  . Active Member of Clubs or Organizations: Not on file  . Attends Archivist Meetings: Not on file  . Marital Status: Not on file  Intimate Partner Violence:   . Fear of Current or Ex-Partner: Not on file  . Emotionally Abused: Not on file  . Physically Abused: Not on file  . Sexually Abused: Not on file    Family History  Problem Relation Age of Onset  . Heart attack Father 27  . Heart attack Paternal Grandfather   . Cancer Mother        "male ca"  . Colon cancer Neg Hx   . Prostate cancer Neg Hx   . Diabetes  Neg Hx   . Esophageal cancer Neg Hx   . Rectal cancer Neg Hx   . Stomach cancer Neg Hx     ROS: no fevers or chills, productive cough, hemoptysis, dysphasia, odynophagia, melena, hematochezia, dysuria, hematuria, rash, seizure activity, orthopnea, PND, pedal edema, claudication. Remaining systems are negative.  Physical Exam: Well-developed well-nourished in no acute distress.  Skin is warm and dry.  HEENT is normal.  Neck is supple.  Chest is  clear to auscultation with normal expansion.  Cardiovascular exam is regular rate and rhythm.  Abdominal exam nontender or distended. No masses palpated. Extremities show no edema.  Status post left AKA neuro grossly intact  ECG-sinus bradycardia at a rate of 55, no ST changes.  Cannot rule out prior inferior infarct.  Personally reviewed  A/P  1 coronary artery disease-patient continues to do well without complaints of chest pain.  Plan to continue aspirin and statin.  2 hypertension-patient's blood pressure is controlled today.  Continue present medical regimen.  3 hyperlipidemia-continue statin.  Kirk Ruths, MD

## 2020-04-07 ENCOUNTER — Telehealth: Payer: Self-pay | Admitting: Internal Medicine

## 2020-04-07 NOTE — Telephone Encounter (Signed)
Patient states that he would like physical labs ordered prior to appointment time on 04/12/2020.   Please advise

## 2020-04-07 NOTE — Telephone Encounter (Signed)
Pt called and made aware that our office doesn't have any lab appointments available before his appointment on 8/18

## 2020-04-12 ENCOUNTER — Encounter: Payer: Self-pay | Admitting: Internal Medicine

## 2020-04-12 ENCOUNTER — Other Ambulatory Visit: Payer: Self-pay

## 2020-04-12 ENCOUNTER — Ambulatory Visit: Payer: PPO | Admitting: Pharmacist

## 2020-04-12 ENCOUNTER — Ambulatory Visit (INDEPENDENT_AMBULATORY_CARE_PROVIDER_SITE_OTHER): Payer: PPO | Admitting: Internal Medicine

## 2020-04-12 VITALS — BP 132/62 | HR 56 | Temp 98.0°F | Resp 16 | Ht 65.0 in | Wt 187.5 lb

## 2020-04-12 DIAGNOSIS — R739 Hyperglycemia, unspecified: Secondary | ICD-10-CM

## 2020-04-12 DIAGNOSIS — E7800 Pure hypercholesterolemia, unspecified: Secondary | ICD-10-CM

## 2020-04-12 DIAGNOSIS — Z0000 Encounter for general adult medical examination without abnormal findings: Secondary | ICD-10-CM

## 2020-04-12 DIAGNOSIS — I10 Essential (primary) hypertension: Secondary | ICD-10-CM

## 2020-04-12 DIAGNOSIS — Z89512 Acquired absence of left leg below knee: Secondary | ICD-10-CM | POA: Diagnosis not present

## 2020-04-12 LAB — CBC WITH DIFFERENTIAL/PLATELET
Basophils Absolute: 0 10*3/uL (ref 0.0–0.1)
Basophils Relative: 0.6 % (ref 0.0–3.0)
Eosinophils Absolute: 0.1 10*3/uL (ref 0.0–0.7)
Eosinophils Relative: 1 % (ref 0.0–5.0)
HCT: 44.1 % (ref 39.0–52.0)
Hemoglobin: 14.9 g/dL (ref 13.0–17.0)
Lymphocytes Relative: 26.4 % (ref 12.0–46.0)
Lymphs Abs: 1.6 10*3/uL (ref 0.7–4.0)
MCHC: 33.8 g/dL (ref 30.0–36.0)
MCV: 94.4 fl (ref 78.0–100.0)
Monocytes Absolute: 0.5 10*3/uL (ref 0.1–1.0)
Monocytes Relative: 8.5 % (ref 3.0–12.0)
Neutro Abs: 3.9 10*3/uL (ref 1.4–7.7)
Neutrophils Relative %: 63.5 % (ref 43.0–77.0)
Platelets: 229 10*3/uL (ref 150.0–400.0)
RBC: 4.67 Mil/uL (ref 4.22–5.81)
RDW: 12.8 % (ref 11.5–15.5)
WBC: 6.1 10*3/uL (ref 4.0–10.5)

## 2020-04-12 LAB — BASIC METABOLIC PANEL
BUN: 17 mg/dL (ref 6–23)
CO2: 28 mEq/L (ref 19–32)
Calcium: 9.4 mg/dL (ref 8.4–10.5)
Chloride: 103 mEq/L (ref 96–112)
Creatinine, Ser: 0.99 mg/dL (ref 0.40–1.50)
GFR: 74.22 mL/min (ref >60.00)
Glucose, Bld: 105 mg/dL — ABNORMAL HIGH (ref 70–99)
Potassium: 4.6 mEq/L (ref 3.5–5.1)
Sodium: 138 mEq/L (ref 135–145)

## 2020-04-12 LAB — HEMOGLOBIN A1C: Hgb A1c MFr Bld: 5.7 % (ref 4.6–6.5)

## 2020-04-12 LAB — AST: AST: 15 U/L (ref 0–37)

## 2020-04-12 LAB — ALT: ALT: 16 U/L (ref 0–53)

## 2020-04-12 NOTE — Patient Instructions (Addendum)
Visit Information  Goals Addressed            This Visit's Progress   . Chronic Care Management Pharmacy Care Plan       CARE PLAN ENTRY (see longitudinal plan of care for additional care plan information)  Current Barriers:  . Chronic Disease Management support, education, and care coordination needs related to Pre-DM, HTN, HLD, CAD, GERD, Vitamin D Deficiency, Vitamin B12 Deficiency, OSA   Hypertension BP Readings from Last 3 Encounters:  04/12/20 132/62  10/12/19 (!) 122/53  04/09/19 110/60   . Pharmacist Clinical Goal(s): o Over the next 180 days, patient will work with PharmD and providers to maintain BP goal <140/90 . Current regimen:  o Diltiazem 240mg  daily . Patient self care activities - Over the next 180 days, patient will: o Maintain hypertension medication regimen.   Hyperlipidemia Lab Results  Component Value Date/Time   LDLCALC 51 10/12/2019 10:33 AM   LDLDIRECT 150.1 06/24/2007 09:00 AM   . Pharmacist Clinical Goal(s): o Over the next 180 days, patient will work with PharmD and providers to maintain LDL goal < 70 . Current regimen:  o Rosuvastatin 40mg  once daily . Patient self care activities - Over the next 180 days, patient will: o Maintain cholesterol medication regimen.   Pre-Diabetes Lab Results  Component Value Date/Time   HGBA1C 6.2 10/12/2019 10:33 AM   HGBA1C 5.6 10/09/2018 09:38 AM   . Pharmacist Clinical Goal(s): o Over the next 180 days, patient will work with PharmD and providers to maintain A1c goal <6.5% . Current regimen:  o Diet and exercise management   . Interventions: o Discussed to replace apple juice with reduced carb orange juice . Patient self care activities - Over the next 180 days, patient will: o Maintain a1c <6.5%  Medication management . Pharmacist Clinical Goal(s): o Over the next 180 days, patient will work with PharmD and providers to maintain optimal medication adherence . Current pharmacy:  [facility] . Interventions o Comprehensive medication review performed. o Continue current medication management strategy . Patient self care activities - Over the next 180 days, patient will: o Focus on medication adherence by filling and taking medications appropriately  o Take medications as prescribed o Report any questions or concerns to PharmD and/or provider(s)  Please see past updates related to this goal by clicking on the "Past Updates" button in the selected goal         The patient verbalized understanding of instructions provided today and agreed to receive a mailed copy of patient instruction and/or educational materials.  Telephone follow up appointment with pharmacy team member scheduled for: 10/12/2020  De Blanch, PharmD Clinical Pharmacist [facility] at [facility] [phone]

## 2020-04-12 NOTE — Patient Instructions (Signed)
Check the  blood pressure regularly.  BP GOAL is between 110/65 and  135/85. If it is consistently higher or lower, let me know    GO TO THE LAB : Get the blood work     [place], [place] back for a routine checkup in 6 months

## 2020-04-12 NOTE — Progress Notes (Signed)
Subjective:    Patient ID: Devin Martin, male    DOB: 1946/05/27, 74 y.o.   MRN: [id number]  DOS:  04/12/2020 Type of visit - description: Here for CPX Doing well. No concerns or unusual symptoms  Wt Readings from Last 3 Encounters:  04/12/20 187 lb 8 oz (85 kg)  10/12/19 191 lb 2 oz (86.7 kg)  04/09/19 193 lb (87.5 kg)    Review of Systems    A 14 point review of systems is negative    Past Medical History:  Diagnosis Date  . Arthritis 1968   pt wears a lt bk prosthesis  . Bronchitis, chronic ([place])   . CAD (coronary artery disease)    Non-ST segment elevation myocardial infarction in 2005 with drug-eluting stent to the left anterior descending. 08-2009 - non-ST segment elevation myocardial infarction, felt secondary to vasospasm.  . Calculus, kidney    hx  . Complication of anesthesia    itching after preop med  ?name - 2013, no SOB  . Glaucoma   . Hyperlipidemia   . Hypertension   . Myocardial infarction [facility]) 2005   x2, 2005, 2010  . OSA (obstructive sleep apnea)    AHI 68/hr in 2002-uses a cpap  . Pneumonia    hx of   . Sleep apnea    wears CPAP  . Wears glasses     Past Surgical History:  Procedure Laterality Date  . 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 CARDIAC CATHETERIZATION    . CYST REMOVAL TRUNK  09/04/2012   Procedure: CYST REMOVAL TRUNK;  Surgeon: Ralene Ok, MD;  Location: [place];  Service: General;  Laterality: N/A;  EXCISION OF MID BACK MASS  . EYE SURGERY Left DR.[place]   CATARACT SX1/2019  . INCISION AND DRAINAGE ABSCESS Left 07/31/2013   Procedure: INCISIONAL/NON-INCISIONAL DEBRIDEMENT OF ABSCESS LEFT KNEE;  Surgeon: Mauri Pole, MD;  [facility];  Service: Orthopedics;  Laterality: Left;  . KIDNEY STONE SURGERY     multiple episodes   . Right Knee Arthroscopy  2010   [facility]  . TONSILLECTOMY      Allergies as of 04/12/2020      Reactions   Penicillins Hives, Other (See Comments)    syncope   Tylox [oxycodone-acetaminophen] Other (See Comments)   "messes me up"   Codeine Nausea Only      Medication List       Accurate as of April 12, 2020 11:59 PM. If you have any questions, ask your nurse or doctor.        Adult Aspirin Low Strength 81 MG EC tablet Generic drug: Aspirin Take 81 mg by mouth every evening.   AMBULATORY NON FORMULARY MEDICATION ResMed CPAP Machine   AMBULATORY NON FORMULARY MEDICATION Prosthetic Leg Liners   B-12 PO Take 1,000 mcg by mouth.   D3 ADULT PO Take 1,000 Units by mouth.   diltiazem 240 MG 24 hr capsule Commonly known as: TIAZAC TAKE 1 CAPSULE BY MOUTH EVERY DAY   GLUCOSAMINE PO Take 1 tablet by mouth every evening. 1500mg    isosorbide mononitrate 60 MG 24 hr tablet Commonly known as: IMDUR TAKE 1 AND 1/2 TABLETS(90 MG) BY MOUTH DAILY   nitroGLYCERIN 0.4 MG SL tablet Commonly known as: Nitrostat PLACE 1 TABLET (0.4 MG TOTAL) UNDER THE TONGUE EVERY 5 (FIVE) MINUTES AS NEEDED FOR CHEST PAIN.   omeprazole 20 MG capsule Commonly known as: PRILOSEC Take 1 capsule (  20 mg total) by mouth 2 (two) times daily. Patient needs office visit for further refills What changed: when to take this   potassium citrate 10 MEQ (1080 MG) SR tablet Commonly known as: UROCIT-K Take 2 tablets (20 mEq total) by mouth daily.   rosuvastatin 40 MG tablet Commonly known as: CRESTOR TAKE 1 TABLET(40 MG) BY MOUTH DAILY          Objective:   Physical Exam BP 132/62 (BP Location: Left Arm, Patient Position: Sitting, Cuff Size: Normal)   Pulse (!) 56   Temp 98 F (36.7 C) (Oral)   Resp 16   Ht 5\' 5"  (1.651 m)   Wt 187 lb 8 oz (85 kg)   SpO2 97%   BMI 31.20 kg/m  General: Well developed, NAD, BMI noted Neck: No  thyromegaly  HEENT:  Normocephalic . Face symmetric, atraumatic Lungs:  CTA B Normal respiratory effort, no intercostal retractions, no accessory muscle use. Heart: RRR,  no murmur.  Abdomen:  Not distended, soft,  non-tender. No rebound or rigidity.   Lower extremities: no pretibial edema bilaterally  Skin: Exposed areas without rash. Not pale. Not jaundice Neurologic:  alert & oriented X3.  Speech normal, gait consistent with previous left leg amputation Strength symmetric and appropriate for age.  Psych: Cognition and judgment appear intact.  Cooperative with normal attention span and concentration.  Behavior appropriate. No anxious or depressed appearing.     Assessment     Assessment Hyperglycemia: A1c 5.01 February 2016 HTN Hyperlipidemia GERD , sporadic sx  Obesity  OSA, + CPAP CAD: NSTEMI 2005, stent. 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 2008 --meningococcal  2008, menactra booster 2017 L LE amputation d/t  trauma Cholelithiasis per ultrasound 09-2015 H/o Urolithiasis Vit D def Vit B 12 def  PLAN: Here for CPX Chronic medical problems: Good med compliance, BP today is very good, check A1c today.  Last FLP satisfactory. RTC 6 months   This visit occurred during the SARS-CoV-2 public health emergency.  Safety protocols were in place, including screening questions prior to the visit, additional usage of staff PPE, and extensive cleaning of exam room while observing appropriate contact time as indicated for disinfecting solutions.

## 2020-04-12 NOTE — Chronic Care Management (AMB) (Signed)
Chronic Care Management Pharmacy  Name: Devin Martin  MRN: [id number] DOB: 10-27-1945  Chief Complaint/ HPI  Devin Martin,  74 y.o. , male presents for their Follow-Up CCM visit with the clinical pharmacist In office.  PCP : Colon Branch, MD  Their chronic conditions include: Pre-DM, HTN, HLD, CAD, GERD, Vitamin D Deficiency, Vitamin B12 Deficiency, OSA  Office Visits: 04/12/20: Visit w/ Dr. Larose Kells - [facility] CPE with Dr. Larose Kells. No med changes noted.  Consult Visits:  None since last CCM visit on 10/27/19.   Medications: Outpatient Encounter Medications as of 04/12/2020  Medication Sig Note  . AMBULATORY NON FORMULARY MEDICATION Prosthetic Leg Liners   . AMBULATORY NON FORMULARY MEDICATION ResMed CPAP Machine   . Aspirin (ADULT ASPIRIN LOW STRENGTH) 81 MG EC tablet Take 81 mg by mouth every evening.    . Cholecalciferol (D3 ADULT PO) Take 1,000 Units by mouth.    . Cyanocobalamin (B-12 PO) Take 1,000 mcg by mouth.    . diltiazem (TIAZAC) 240 MG 24 hr capsule TAKE 1 CAPSULE BY MOUTH EVERY Hason Ofarrell   . Glucosamine HCl (GLUCOSAMINE PO) Take 1 tablet by mouth every evening. 1500mg    . isosorbide mononitrate (IMDUR) 60 MG 24 hr tablet TAKE 1 AND 1/2 TABLETS(90 MG) BY MOUTH DAILY   . nitroGLYCERIN (NITROSTAT) 0.4 MG SL tablet PLACE 1 TABLET (0.4 MG TOTAL) UNDER THE TONGUE EVERY 5 (FIVE) MINUTES AS NEEDED FOR CHEST PAIN. (Patient not taking: Reported on 04/12/2020) 05/06/2016: PRN  . omeprazole (PRILOSEC) 20 MG capsule Take 1 capsule (20 mg total) by mouth 2 (two) times daily. Patient needs office visit for further refills (Patient taking differently: Take 20 mg by mouth daily. Patient needs office visit for further refills) 04/12/2020: Patient taking every 3rd in effort to reduce overall intake  . potassium citrate (UROCIT-K) 10 MEQ (1080 MG) SR tablet Take 2 tablets (20 mEq total) by mouth daily.   . rosuvastatin (CRESTOR) 40 MG tablet TAKE 1 TABLET(40 MG) BY MOUTH DAILY    No  facility-administered encounter medications on file as of 04/12/2020.   Immunization History  Administered Date(s) Administered  . Fluad Quad(high Dose 65+) 06/04/2019  . HiB (PRP-OMP) 06/23/2007  . Influenza Split 08/15/2011  . Influenza, High Dose Seasonal PF 08/07/2016, 09/26/2017  . Influenza,inj,Quad PF,6+ Mos 06/06/2014, 09/22/2015  . Influenza-Unspecified 05/30/2018  . Meningococcal B, OMV 01/16/2017, 03/21/2017  . Meningococcal Conjugate 02/06/2016  . Meningococcal Polysaccharide 06/23/2007  . Moderna SARS-COVID-2 Vaccination 09/07/2019, 10/06/2019  . Pneumococcal Conjugate-13 09/22/2015  . Pneumococcal Polysaccharide-23 01/29/2013  . Td 08/26/2006, 09/26/2017  . Zoster 02/06/2016   Received 2nd covid vaccine Levan Hurst)  SDOH Screenings   Alcohol Screen:   . Last Alcohol Screening Score (AUDIT):   Depression (PHQ2-9): Low Risk   . PHQ-2 Score: 0  Financial Resource Strain: Low Risk   . Difficulty of Paying Living Expenses: Not hard at all  Food Insecurity:   . Worried About Charity fundraiser in the Last Year:   . [place] in the Last Year:   Housing:   . Last Housing Risk Score:   Physical Activity:   . Days of Exercise per Week:   . Minutes of Exercise per Session:   Social Connections:   . Frequency of Communication with Friends and Family:   . Frequency of Social Gatherings with Friends and Family:   . Attends Religious Services:   . Active Member of Clubs or Organizations:   . Attends  Club or Organization Meetings:   Marland Kitchen Marital Status:   Stress:   . Feeling of Stress :   Tobacco Use: Medium Risk  . Smoking Tobacco Use: Former Smoker  . Smokeless Tobacco Use: Never Used  Transportation Needs: No Transportation Needs  . Lack of Transportation (Medical): No  . Lack of Transportation (Non-Medical): No     Current Diagnosis/Assessment:  Goals Addressed            This Visit's Progress   . Chronic Care Management Pharmacy Care Plan       CARE  PLAN ENTRY (see longitudinal plan of care for additional care plan information)  Current Barriers:  . Chronic Disease Management support, education, and care coordination needs related to Pre-DM, HTN, HLD, CAD, GERD, Vitamin D Deficiency, Vitamin B12 Deficiency, OSA   Hypertension BP Readings from Last 3 Encounters:  04/12/20 132/62  10/12/19 (!) 122/53  04/09/19 110/60   . Pharmacist Clinical Goal(s): o Over the next 180 days, patient will work with PharmD and providers to maintain BP goal <140/90 . Current regimen:  o Diltiazem 240mg  daily . Patient self care activities - Over the next 180 days, patient will: o Maintain hypertension medication regimen.   Hyperlipidemia Lab Results  Component Value Date/Time   LDLCALC 51 10/12/2019 10:33 AM   LDLDIRECT 150.1 06/24/2007 09:00 AM   . Pharmacist Clinical Goal(s): o Over the next 180 days, patient will work with PharmD and providers to maintain LDL goal < 70 . Current regimen:  o Rosuvastatin 40mg  once daily . Patient self care activities - Over the next 180 days, patient will: o Maintain cholesterol medication regimen.   Pre-Diabetes Lab Results  Component Value Date/Time   HGBA1C 6.2 10/12/2019 10:33 AM   HGBA1C 5.6 10/09/2018 09:38 AM   . Pharmacist Clinical Goal(s): o Over the next 180 days, patient will work with PharmD and providers to maintain A1c goal <6.5% . Current regimen:  o Diet and exercise management   . Interventions: o Discussed to replace apple juice with reduced carb orange juice . Patient self care activities - Over the next 180 days, patient will: o Maintain a1c <6.5%  Medication management . Pharmacist Clinical Goal(s): o Over the next 180 days, patient will work with PharmD and providers to maintain optimal medication adherence . Current pharmacy: [facility] . Interventions o Comprehensive medication review performed. o Continue current medication management strategy . Patient self care  activities - Over the next 180 days, patient will: o Focus on medication adherence by filling and taking medications appropriately  o Take medications as prescribed o Report any questions or concerns to PharmD and/or provider(s)  Please see past updates related to this goal by clicking on the "Past Updates" button in the selected goal        Social Hx: Married since 1974. 2 kids, 2 grandkids Uses pill box 2 weeks  Keeps pills in a big bag  Pre-Diabetes   A1c goal <6.5%  Recent Relevant Labs: Lab Results  Component Value Date/Time   HGBA1C 6.2 10/12/2019 10:33 AM   HGBA1C 5.6 10/09/2018 09:38 AM     Patient is currently controlled on the following medications:   None  Avoids carbohydrates Eats  1/2 glass of apple daily (27g for 8 oz) Reduced Carb Orange juice (13g for 8 oz) Exercises 1.5 hrs per Aretha Levi Plans to eat grilled blackened salmon on a salad tonight for dinner.   We discussed: Importance of limiting carbohydrates   Update 04/12/20 Patient  did move to reduced carb orange juice and reports he has lost 5lbs since last visit.  Congratulated patient on progress.  Plan -Continue control with diet and exercise  Hypertension   BP goal: <140/90  CMP Latest Ref Rng & Units 10/12/2019 10/09/2018 03/26/2018  Glucose 70 - 99 mg/dL 105(H) 95 115(H)  BUN 6 - 23 mg/dL 17 15 16   Creatinine 0.40 - 1.50 mg/dL 0.84 0.78 0.83  Sodium 135 - 145 mEq/L 138 140 139  Potassium 3.5 - 5.1 mEq/L 4.8 4.1 4.2  Chloride 96 - 112 mEq/L 103 104 104  CO2 19 - 32 mEq/L 31 28 30   Calcium 8.4 - 10.5 mg/dL 9.2 9.0 9.0  Total Protein 6.0 - 8.3 g/dL 6.0 6.1 -  Total Bilirubin 0.2 - 1.2 mg/dL 0.6 1.3(H) -  Alkaline Phos 39 - 117 U/L 71 69 -  AST 0 - 37 U/L 17 17 -  ALT 0 - 53 U/L 23 24 -     Office blood pressures are  BP Readings from Last 3 Encounters:  04/12/20 132/62  10/12/19 (!) 122/53  04/09/19 110/60    Patient has failed these meds in the past: None noted   Patient is  currently controlled on the following medications:   Diltiazem 240mg  daily  Patient checks BP at home Never. Does not have BP cuff   Update 04/12/20 BP at goal per clinic BP reading today. Pt does not express any concerns. Medications filled at appropriate dates.  Plan -Continue current medications   Hyperlipidemia   LDL goal <70  Lipid Panel     Component Value Date/Time   CHOL 121 10/12/2019 1033   TRIG 60.0 10/12/2019 1033   HDL 58.30 10/12/2019 1033   CHOLHDL 2 10/12/2019 1033   VLDL 12.0 10/12/2019 1033   LDLCALC 51 10/12/2019 1033   LDLDIRECT 150.1 06/24/2007 0900     ASCVD 10-year risk: Hx of ASCVD   Patient has failed these meds in past: None noted Patient is currently controlled on the following medications:  Rosuvastatin 40mg  once daily  Plan -Continue current medications    GERD    Patient has failed these meds in past: None noted Patient is currently controlled on the following medications:   Omeprazole 20mg  daily  Breakthrough Sx: None  Patient is interested in tapering off of omeprazole. Will follow patient for tapering plan.  We discussed:  risk/benefit of continuation and cessation of PPI drug therapy   Update 04/12/20 Did you ever taper off of omeprazole? Yes, he is taking it every third Odel Schmid. Not having symptoms of GERD. Does not want to develop symptoms of GERD due to it feeling like he is having a heart attack. He would like to stay at his dose of taking every 3rd Taniqua Issa for this reason. Agreeable with choice. Re-discussed risk/benefit of long term PPI use balancing managing symptoms.   Plan -Continue taking omeprazole every 3rd Tylin Force.  Medication Management   Pt uses [facility] for all medications Uses pill box? Yes Pt endorses 100% compliance  We discussed: Option of [employer]. Showed patient adherence packaging and how we could sync and deliver his medications. He would like to continue with her current system.  Plan -Continue  current medication management strategy  Follow up: 6 month phone visit

## 2020-04-12 NOTE — Progress Notes (Signed)
Pre visit review using our clinic review tool, if applicable. No additional management support is needed unless otherwise documented below in the visit note. 

## 2020-04-13 ENCOUNTER — Telehealth: Payer: Self-pay | Admitting: Cardiology

## 2020-04-13 ENCOUNTER — Other Ambulatory Visit: Payer: Self-pay

## 2020-04-13 ENCOUNTER — Encounter: Payer: Self-pay | Admitting: Internal Medicine

## 2020-04-13 ENCOUNTER — Other Ambulatory Visit: Payer: Self-pay | Admitting: Cardiology

## 2020-04-13 ENCOUNTER — Other Ambulatory Visit: Payer: Self-pay | Admitting: Internal Medicine

## 2020-04-13 DIAGNOSIS — R072 Precordial pain: Secondary | ICD-10-CM

## 2020-04-13 NOTE — Assessment & Plan Note (Signed)
-   Td2/2019 - [id number] - PNM 23: 2014 - prevnar 2017 - shingrix discussed before - s/p Covid vaccine - flu shot today recommend -LOP:[id number] 12/10, colonoscopy again 2016, next 10 years -Prostate cancer screening: PSA last year 0.79 -Labs: BMP, AST, ALT, CBC, A1c -Diet exercise: Doing great, has decrease his carbohydrate intake, he remains active, cardio exercise 20 minutes daily.

## 2020-04-13 NOTE — Telephone Encounter (Signed)
   *  STAT* If patient is at the pharmacy, call can be transferred to refill team.   1. Which medications need to be refilled? (please list name of each medication and dose if known)  potassium citrate (UROCIT-K) 10 MEQ (1080 MG) SR tablet  isosorbide mononitrate (IMDUR) 60 MG 24 hr tablet omeprazole (PRILOSEC) 20 MG capsule  2. Which pharmacy/location (including street and city if local pharmacy) is medication to be sent to?: [place], [place] - [address] AT [address]  3. Do they need a 30 day or 90 day supply? 90 with refills  Pt saud his pharmacy denied these medications

## 2020-04-13 NOTE — Assessment & Plan Note (Signed)
Here for CPX Chronic medical problems: Good med compliance, BP today is very good, check A1c today.  Last FLP satisfactory. RTC 6 months

## 2020-04-14 ENCOUNTER — Ambulatory Visit: Payer: PPO | Admitting: Cardiology

## 2020-04-14 ENCOUNTER — Other Ambulatory Visit: Payer: Self-pay

## 2020-04-14 ENCOUNTER — Encounter: Payer: Self-pay | Admitting: Cardiology

## 2020-04-14 VITALS — BP 132/62 | HR 55 | Ht 65.5 in | Wt 187.0 lb

## 2020-04-14 DIAGNOSIS — I2510 Atherosclerotic heart disease of native coronary artery without angina pectoris: Secondary | ICD-10-CM | POA: Diagnosis not present

## 2020-04-14 DIAGNOSIS — R072 Precordial pain: Secondary | ICD-10-CM

## 2020-04-14 DIAGNOSIS — I10 Essential (primary) hypertension: Secondary | ICD-10-CM

## 2020-04-14 DIAGNOSIS — E7800 Pure hypercholesterolemia, unspecified: Secondary | ICD-10-CM | POA: Diagnosis not present

## 2020-04-14 MED ORDER — OMEPRAZOLE 20 MG PO CPDR: 20.0000 mg | DELAYED_RELEASE_CAPSULE | ORAL | 3 refills | Status: DC

## 2020-04-14 MED ORDER — POTASSIUM CITRATE ER 10 MEQ (1080 MG) PO TBCR: 20.0000 meq | EXTENDED_RELEASE_TABLET | Freq: Every day | ORAL | 3 refills | Status: DC

## 2020-04-14 MED ORDER — ISOSORBIDE MONONITRATE ER 60 MG PO TB24: ORAL_TABLET | ORAL | 3 refills | Status: DC

## 2020-04-14 NOTE — Patient Instructions (Signed)
Medication Instructions:  NO CHANGE *If you need a refill on your cardiac medications before your next appointment, please call your pharmacy*   Lab Work: If you have labs (blood work) drawn today and your tests are completely normal, you will receive your results only by: . MyChart Message (if you have MyChart) OR . A paper copy in the mail If you have any lab test that is abnormal or we need to change your treatment, we will call you to review the results.   Follow-Up: At [facility], you and your health needs are our priority.  As part of our continuing mission to provide you with exceptional heart care, we have created designated Provider Care Teams.  These Care Teams include your primary Cardiologist (physician) and Advanced Practice Providers (APPs -  Physician Assistants and Nurse Practitioners) who all work together to provide you with the care you need, when you need it.  We recommend signing up for the patient portal called "MyChart".  Sign up information is provided on this After Visit Summary.  MyChart is used to connect with patients for Virtual Visits (Telemedicine).  Patients are able to view lab/test results, encounter notes, upcoming appointments, etc.  Non-urgent messages can be sent to your provider as well.   To learn more about what you can do with MyChart, go to [email or web address].    Your next appointment:   12 month(s)  The format for your next appointment:   In Person  Provider:   You may see BRIAN CRENSHAW MD or one of the following Advanced Practice Providers on your designated Care Team:    Luke Kilroy, PA-C  Callie Goodrich, PA-C  Jesse Cleaver, FNP     

## 2020-05-28 ENCOUNTER — Other Ambulatory Visit: Payer: Self-pay | Admitting: Cardiology

## 2020-05-29 ENCOUNTER — Telehealth: Payer: Self-pay | Admitting: Pharmacist

## 2020-05-29 NOTE — Progress Notes (Addendum)
Chronic Care Management Pharmacy Assistant   Name: GERAMY LAMORTE  MRN: [id number] DOB: 1946-05-09  Reason for Encounter: Disease State  Patient Questions:  1.  Have you seen any other providers since your last visit? Yes  2.  Any changes in your medicines or health? No    PCP : Colon Branch, MD   Their chronic conditions include: Pre-DM, HTN, HLD, CAD, GERD, Vitamin D Deficiency, Vitamin B12 Deficiency, OSA  Consults: 04-14-20(Cardiology) Patient presented in office with Kirk Ruths for a follow up visit.  Provider changed Omeprazole 20 mg to every other day.    No Office Visits or Hospitalizations since last CCM visit on 04-12-20.  Allergies:   Allergies  Allergen Reactions  . Penicillins Hives and Other (See Comments)    syncope  . Tylox [Oxycodone-Acetaminophen] Other (See Comments)    "messes me up"  . Codeine Nausea Only    Medications: Outpatient Encounter Medications as of 05/29/2020  Medication Sig Note  . AMBULATORY NON FORMULARY MEDICATION Prosthetic Leg Liners   . AMBULATORY NON FORMULARY MEDICATION ResMed CPAP Machine   . Aspirin (ADULT ASPIRIN LOW STRENGTH) 81 MG EC tablet Take 81 mg by mouth every evening.    . Cholecalciferol (D3 ADULT PO) Take 1,000 Units by mouth.    . Cyanocobalamin (B-12 PO) Take 1,000 mcg by mouth.    . diltiazem (TIAZAC) 240 MG 24 hr capsule TAKE 1 CAPSULE BY MOUTH EVERY DAY   . Glucosamine HCl (GLUCOSAMINE PO) Take 1 tablet by mouth every evening. 1500mg    . isosorbide mononitrate (IMDUR) 60 MG 24 hr tablet TAKE 1 AND 1/2 TABLETS(90 MG) BY MOUTH DAILY   . nitroGLYCERIN (NITROSTAT) 0.4 MG SL tablet PLACE 1 TABLET (0.4 MG TOTAL) UNDER THE TONGUE EVERY 5 (FIVE) MINUTES AS NEEDED FOR CHEST PAIN. 05/06/2016: PRN  . omeprazole (PRILOSEC) 20 MG capsule Take 1 capsule (20 mg total) by mouth every other day. Patient needs office visit for further refills   . potassium citrate (UROCIT-K) 10 MEQ (1080 MG) SR tablet Take 2 tablets (20 mEq  total) by mouth daily.   . rosuvastatin (CRESTOR) 40 MG tablet TAKE 1 TABLET(40 MG) BY MOUTH DAILY    No facility-administered encounter medications on file as of 05/29/2020.    Current Diagnosis: Patient Active Problem List   Diagnosis Date Noted  . Remote history of stroke 06/03/2019  . Cholecystitis 01/16/2017  . Abdominal pain, epigastric 11/01/2016  . S/P BKA (below knee amputation) ([place]) 05/06/2016  . PCP NOTES >>>>>>>>>>>>>>>>>>> 02/07/2016  . DJD (degenerative joint disease) 12/31/2013  . Annual physical exam 08/15/2011  . Hypogonadism male 08/12/2011  . GERD 04/05/2010  . ANXIETY 10/13/2009  . DYSPNEA 10/13/2009  . ABDOMINAL BRUIT 01/27/2009  . Essential hypertension 01/26/2009  . Hyperlipidemia 06/23/2007  . Obstructive sleep apnea 06/23/2007  . Coronary atherosclerosis 06/23/2007  . CALCULUS, KIDNEY 06/17/2007    Goals Addressed   None    Reviewed chart prior to disease state call. Spoke with patient regarding BP  Recent Office Vitals: BP Readings from Last 3 Encounters:  04/14/20 132/62  04/12/20 132/62  10/12/19 (!) 122/53   Pulse Readings from Last 3 Encounters:  04/14/20 (!) 55  04/12/20 (!) 56  10/12/19 (!) 50    Wt Readings from Last 3 Encounters:  04/14/20 187 lb (84.8 kg)  04/12/20 187 lb 8 oz (85 kg)  10/12/19 191 lb 2 oz (86.7 kg)     Kidney Function Lab Results  Component Value  Date/Time   CREATININE 0.99 04/12/2020 09:46 AM   CREATININE 0.84 10/12/2019 10:33 AM   CREATININE 0.91 01/29/2013 03:46 PM   GFR 74.22 04/12/2020 09:46 AM   GFRNONAA >60 10/30/2016 03:24 PM   GFRAA >60 10/30/2016 03:24 PM    BMP Latest Ref Rng & Units 04/12/2020 10/12/2019 10/09/2018  Glucose 70 - 99 mg/dL 105(H) 105(H) 95  BUN 6 - 23 mg/dL 17 17 15   Creatinine 0.40 - 1.50 mg/dL 0.99 0.84 0.78  Sodium 135 - 145 mEq/L 138 138 140  Potassium 3.5 - 5.1 mEq/L 4.6 4.8 4.1  Chloride 96 - 112 mEq/L 103 103 104  CO2 19 - 32 mEq/L 28 31 28   Calcium 8.4 - 10.5  mg/dL 9.4 9.2 9.0    . Current antihypertensive regimen:  ? Diltiazem 240mg  daily . How often are you checking your Blood Pressure? Reports blood pressure is always good.  States he does not keep track of it. . Current home BP readings: None  . What recent interventions/DTPs have been made by any provider to improve Blood Pressure control since last CPP Visit: None . Any recent hospitalizations or ED visits since last visit with CPP? No . What diet changes have been made to improve Blood Pressure Control?  o Stay away from carb's, sugar and starches as much as he can. . What exercise is being done to improve your Blood Pressure Control?  o Patient states he has a gym in home and does an hour and half daily.  Adherence Review: Is the patient currently on ACE/ARB medication? No Does the patient have >5 day gap between last estimated fill dates? No     Follow-Up:  Pharmacist Review   [place] Shannon, [place] [facility] Pharmacist Assistant [phone]  Reviewed by: De Blanch, PharmD Clinical Pharmacist [facility] [phone]

## 2020-06-11 ENCOUNTER — Other Ambulatory Visit: Payer: Self-pay | Admitting: Cardiology

## 2020-06-23 ENCOUNTER — Telehealth: Payer: Self-pay | Admitting: Pharmacist

## 2020-06-23 NOTE — Progress Notes (Addendum)
Verified Adherence GAP Information.  Per insurance data, the patient is 100% medication adherence for cholesterol(statins).  Compliant with Colorectal Cancer Screening met.  Annual wellness visits met.  Rosuvastatin 40 mg 90-days  Last filled on 03-08-20,  HTA data through 04/25/2020.  Devin Martin, [facility] Pharmacist Assistant [phone]   Reviewed by: De Blanch, PharmD Clinical Pharmacist [facility] at [facility] [phone]

## 2020-07-25 DIAGNOSIS — G4733 Obstructive sleep apnea (adult) (pediatric): Secondary | ICD-10-CM | POA: Diagnosis not present

## 2020-08-14 ENCOUNTER — Encounter: Payer: Self-pay | Admitting: Internal Medicine

## 2020-08-21 ENCOUNTER — Encounter: Payer: Self-pay | Admitting: Internal Medicine

## 2020-08-21 ENCOUNTER — Telehealth: Payer: Self-pay

## 2020-08-21 NOTE — Telephone Encounter (Signed)
Form received, placed in PCP red folder to be signed.

## 2020-08-23 NOTE — Telephone Encounter (Signed)
Form faxed back to [facility] at [phone]. Form sent for scanning. Received fax confirmation.

## 2020-08-30 DIAGNOSIS — Z89512 Acquired absence of left leg below knee: Secondary | ICD-10-CM | POA: Diagnosis not present

## 2020-09-16 ENCOUNTER — Encounter: Payer: Self-pay | Admitting: Internal Medicine

## 2020-09-18 ENCOUNTER — Other Ambulatory Visit: Payer: Self-pay | Admitting: *Deleted

## 2020-09-18 DIAGNOSIS — R072 Precordial pain: Secondary | ICD-10-CM

## 2020-09-18 MED ORDER — ROSUVASTATIN CALCIUM 40 MG PO TABS: 40.0000 mg | ORAL_TABLET | Freq: Every day | ORAL | 3 refills | Status: DC

## 2020-09-18 MED ORDER — DILTIAZEM HCL ER BEADS 240 MG PO CP24
240.0000 mg | ORAL_CAPSULE | Freq: Every day | ORAL | 3 refills | Status: DC
Start: 2020-09-18 — End: 2021-05-18

## 2020-09-18 MED ORDER — ISOSORBIDE MONONITRATE ER 60 MG PO TB24: ORAL_TABLET | ORAL | 3 refills | Status: DC

## 2020-09-20 ENCOUNTER — Telehealth: Payer: Self-pay | Admitting: Pharmacist

## 2020-09-20 NOTE — Progress Notes (Addendum)
Chronic Care Management Pharmacy Assistant   Name: Devin Martin  MRN: [id number] DOB: March 05, 1946  Reason for Encounter: Disease State for HTN.  Patient Questions:  1.  Have you seen any other providers since your last visit? No.   2.  Any changes in your medicines or health? No.    PCP : Colon Branch, MD  Their chronic conditions include: Pre-DM, HTN, HLD, CAD, GERD, Vitamin D Deficiency, Vitamin B12 Deficiency, OSA.   Office Visits: None since 06/23/20  Consults: None since 06/23/20  Allergies:   Allergies  Allergen Reactions   Penicillins Hives and Other (See Comments)    syncope   Tylox [Oxycodone-Acetaminophen] Other (See Comments)    "messes me up"   Codeine Nausea Only    Medications: Outpatient Encounter Medications as of 09/20/2020  Medication Sig Note   AMBULATORY NON FORMULARY MEDICATION Prosthetic Leg Liners    AMBULATORY NON FORMULARY MEDICATION ResMed CPAP Machine    Aspirin (ADULT ASPIRIN LOW STRENGTH) 81 MG EC tablet Take 81 mg by mouth every evening.     Cholecalciferol (D3 ADULT PO) Take 1,000 Units by mouth.     Cyanocobalamin (B-12 PO) Take 1,000 mcg by mouth.     diltiazem (TIAZAC) 240 MG 24 hr capsule Take 1 capsule (240 mg total) by mouth daily.    Glucosamine HCl (GLUCOSAMINE PO) Take 1 tablet by mouth every evening. 1500mg     isosorbide mononitrate (IMDUR) 60 MG 24 hr tablet TAKE 1 AND 1/2 TABLETS(90 MG) BY MOUTH DAILY    nitroGLYCERIN (NITROSTAT) 0.4 MG SL tablet PLACE 1 TABLET (0.4 MG TOTAL) UNDER THE TONGUE EVERY 5 (FIVE) MINUTES AS NEEDED FOR CHEST PAIN. 05/06/2016: PRN   omeprazole (PRILOSEC) 20 MG capsule Take 1 capsule (20 mg total) by mouth every other day. Patient needs office visit for further refills    potassium citrate (UROCIT-K) 10 MEQ (1080 MG) SR tablet Take 2 tablets (20 mEq total) by mouth daily.    rosuvastatin (CRESTOR) 40 MG tablet Take 1 tablet (40 mg total) by mouth daily.    No facility-administered encounter  medications on file as of 09/20/2020.    Current Diagnosis: Patient Active Problem List   Diagnosis Date Noted   Remote history of stroke 06/03/2019   Cholecystitis 01/16/2017   Abdominal pain, epigastric 11/01/2016   S/P BKA (below knee amputation) ([place]) 05/06/2016   PCP NOTES >>>>>>>>>>>>>>>>>>> 02/07/2016   DJD (degenerative joint disease) 12/31/2013   Annual physical exam 08/15/2011   Hypogonadism male 08/12/2011   GERD 04/05/2010   ANXIETY 10/13/2009   DYSPNEA 10/13/2009   ABDOMINAL BRUIT 01/27/2009   Essential hypertension 01/26/2009   Hyperlipidemia 06/23/2007   Obstructive sleep apnea 06/23/2007   Coronary atherosclerosis 06/23/2007   CALCULUS, KIDNEY 06/17/2007    Goals Addressed   None    Reviewed chart prior to disease state call. Spoke with patient regarding BP  Recent Office Vitals: BP Readings from Last 3 Encounters:  04/14/20 132/62  04/12/20 132/62  10/12/19 (!) 122/53   Pulse Readings from Last 3 Encounters:  04/14/20 (!) 55  04/12/20 (!) 56  10/12/19 (!) 50    Wt Readings from Last 3 Encounters:  04/14/20 187 lb (84.8 kg)  04/12/20 187 lb 8 oz (85 kg)  10/12/19 191 lb 2 oz (86.7 kg)     Kidney Function Lab Results  Component Value Date/Time   CREATININE 0.99 04/12/2020 09:46 AM   CREATININE 0.84 10/12/2019 10:33 AM   CREATININE 0.91 01/29/2013 03:46  PM   GFR 74.22 04/12/2020 09:46 AM   GFRNONAA >60 10/30/2016 03:24 PM   GFRAA >60 10/30/2016 03:24 PM    BMP Latest Ref Rng & Units 04/12/2020 10/12/2019 10/09/2018  Glucose 70 - 99 mg/dL 105(H) 105(H) 95  BUN 6 - 23 mg/dL 17 17 15   Creatinine 0.40 - 1.50 mg/dL 0.99 0.84 0.78  Sodium 135 - 145 mEq/L 138 138 140  Potassium 3.5 - 5.1 mEq/L 4.6 4.8 4.1  Chloride 96 - 112 mEq/L 103 103 104  CO2 19 - 32 mEq/L 28 31 28   Calcium 8.4 - 10.5 mg/dL 9.4 9.2 9.0    Current antihypertensive regimen:  Diltiazem 240 mg daily  How often are you checking your Blood Pressure? Patient stated he doesn't  check his blood pressure because feel like he needs to do so.  Current home BP readings: N/A  What recent interventions/DTPs have been made by any provider to improve Blood Pressure control since last CPP Visit: None.  Any recent hospitalizations or ED visits since last visit with CPP? No.  What diet changes have been made to improve Blood Pressure Control?  Patient stated he stays away from bread, starches, and salt. Patient stated he does eat a good portion of vegetables, fruit and meat. He stated he doesn't drink any carbohydrate drinks and only drinks water.   What exercise is being done to improve your Blood Pressure Control?  Patient stated he work out for about 1.5 daily at home on his elliptical.   Adherence Review: Is the patient currently on ACE/ARB medication? No.   Does the patient have >5 day gap between last estimated fill dates? No  Patient stated he does not have any concerns about his medications at this time but he is concerned about getting another shingles vaccine, he was awaiting a reply from Dr. Larose Kells on rather he should get the vaccine or not but haven't heard back from the office. I informed him I would send a reminder to Dr. Larose Kells nurse to call him. Patient reminded of his appointment with the CCM and stated the time and date is perfect.    Follow-Up:  Pharmacist Review   Charlann Lange, RMA Clinical Pharmacist Assistant [phone]  7 minutes spent in review, coordination, and documentation.  Reviewed by: Beverly Milch, PharmD Clinical Pharmacist [facility] [phone]

## 2020-10-09 ENCOUNTER — Encounter: Payer: Self-pay | Admitting: Internal Medicine

## 2020-10-09 ENCOUNTER — Other Ambulatory Visit: Payer: Self-pay | Admitting: Cardiology

## 2020-10-09 NOTE — Progress Notes (Unsigned)
Chronic Care Management Pharmacy Note  10/09/2020 Name:  Devin Martin MRN:  [id number] DOB:  [birth year]  Subjective: Devin Martin is an 75 y.o. year old male who is a primary patient of Paz, [name], MD.  The CCM team was consulted for assistance with disease management and care coordination needs.    Engaged with patient by telephone for follow up visit in response to provider referral for pharmacy case management and/or care coordination services.   Consent to Services:  The patient was given the following information about Chronic Care Management services today, agreed to services, and gave verbal consent: 1. CCM service includes personalized support from designated clinical staff supervised by the primary care provider, including individualized plan of care and coordination with other care providers 2. 24/7 contact phone numbers for assistance for urgent and routine care needs. 3. Service will only be billed when office clinical staff spend 20 minutes or more in a month to coordinate care. 4. Only one practitioner may furnish and bill the service in a calendar month. 5.The patient may stop CCM services at any time (effective at the end of the month) by phone call to the office staff. 6. The patient will be responsible for cost sharing (co-pay) of up to 20% of the service fee (after annual deductible is met). Patient agreed to services and consent obtained.  Patient Care Team: Colon Branch, MD as PCP - General (Internal Medicine) [name] Denice Bors, MD as Consulting Physician (Cardiology) Iona Beard, [place] as Referring Physician (Optometry) Orie Rout as Referring Physician (Dentistry) Paralee Cancel, MD as Consulting Physician (Orthopedic Surgery) Fanny Skates, MD as Consulting Physician (General Surgery) Shawnie Dapper, DO as Consulting Physician (Ophthalmology) Day, Melvenia Beam, [facility] (Pharmacist)  Recent office visits: 04/12/20 Larose Kells) - Annual physical, no medication changes monitor BP  at home and notify provider if out of range  Recent consult visits: 04/14/20 (Crenshaw, cardio) - no medication changes, regular follow up  Hospital visits: None in previous 6 months  Objective:  Lab Results  Component Value Date   CREATININE 0.99 04/12/2020   BUN 17 04/12/2020   GFR 74.22 04/12/2020   GFRNONAA >60 10/30/2016   GFRAA >60 10/30/2016   NA 138 04/12/2020   K 4.6 04/12/2020   CALCIUM 9.4 04/12/2020   CO2 28 04/12/2020    Lab Results  Component Value Date/Time   HGBA1C 5.7 04/12/2020 09:46 AM   HGBA1C 6.2 10/12/2019 10:33 AM   GFR 74.22 04/12/2020 09:46 AM   GFR 89.85 10/12/2019 10:33 AM    Last diabetic Eye exam: No results found for: HMDIABEYEEXA  Last diabetic Foot exam: No results found for: HMDIABFOOTEX   Lab Results  Component Value Date   CHOL 121 10/12/2019   HDL 58.30 10/12/2019   LDLCALC 51 10/12/2019   LDLDIRECT 150.1 06/24/2007   TRIG 60.0 10/12/2019   CHOLHDL 2 10/12/2019    Hepatic Function Latest Ref Rng & Units 04/12/2020 10/12/2019 10/09/2018  Total Protein 6.0 - 8.3 g/dL - 6.0 6.1  Albumin 3.5 - 5.2 g/dL - 4.1 4.2  AST 0 - 37 U/L 15 17 17   ALT 0 - 53 U/L 16 23 24   Alk Phosphatase 39 - 117 U/L - 71 69  Total Bilirubin 0.2 - 1.2 mg/dL - 0.6 1.3(H)  Bilirubin, Direct 0.1 - 0.5 mg/dL - - -    Lab Results  Component Value Date/Time   TSH 1.47 10/09/2018 09:38 AM   TSH 1.895 01/29/2013 03:46 PM  CBC Latest Ref Rng & Units 04/12/2020 10/09/2018 09/26/2017  WBC 4.0 - 10.5 K/uL 6.1 5.8 5.9  Hemoglobin 13.0 - 17.0 g/dL 14.9 15.1 15.4  Hematocrit 39.0 - 52.0 % 44.1 45.0 45.6  Platelets 150.0 - 400.0 K/uL 229.0 244.0 250.0    Lab Results  Component Value Date/Time   VD25OH 30.95 10/12/2019 10:33 AM   VD25OH 29.72 (L) 04/09/2019 08:52 AM    Clinical ASCVD: {YES/NO:21197} The ASCVD Risk score Mikey Bussing DC Jr., et al., 2013) failed to calculate for the following reasons:   The valid total cholesterol range is 130 to 320 mg/dL     Depression screen [facility] 2/9 10/12/2019 10/09/2018 09/26/2017  Decreased Interest 0 0 0  Down, Depressed, Hopeless 0 0 0  PHQ - 2 Score 0 0 0  Some recent data might be hidden     ***Other: (CHADS2VASc if Afib, MMRC or CAT for COPD, ACT, DEXA)  Social History   Tobacco Use  Smoking Status Former Smoker  . Packs/day: 0.75  . Years: 20.00  . Pack years: 15.00  . Types: Cigarettes, E-cigarettes  Smokeless Tobacco Never Used  Tobacco Comment   currently doing some e-cigarrets     BP Readings from Last 3 Encounters:  04/14/20 132/62  04/12/20 132/62  10/12/19 (!) 122/53   Pulse Readings from Last 3 Encounters:  04/14/20 (!) 55  04/12/20 (!) 56  10/12/19 (!) 50   Wt Readings from Last 3 Encounters:  04/14/20 187 lb (84.8 kg)  04/12/20 187 lb 8 oz (85 kg)  10/12/19 191 lb 2 oz (86.7 kg)    Assessment/Interventions: Review of patient past medical history, allergies, medications, health status, including review of consultants reports, laboratory and other test data, was performed as part of comprehensive evaluation and provision of chronic care management services.   SDOH:  (Social Determinants of Health) assessments and interventions performed: {yes/no:20286}   CCM Care Plan  Allergies  Allergen Reactions  . Penicillins Hives and Other (See Comments)    syncope  . Tylox [Oxycodone-Acetaminophen] Other (See Comments)    "messes me up"  . Codeine Nausea Only    Medications Reviewed Today    Reviewed by Lelon Perla, MD (Physician) on 04/14/20 at [phone]  Med List Status: <None>  Medication Order Taking? Sig Documenting Provider Last Dose Status Informant  AMBULATORY NON [facility] MEDICATION [id number] Yes Prosthetic Leg Liners Colon Branch, MD Taking Active   AMBULATORY Baruch Gouty MEDICATION [id number] Yes ResMed CPAP Machine [provider] Taking Active   Aspirin (ADULT ASPIRIN LOW STRENGTH) 81 MG EC tablet [id number] Yes Take 81 mg by mouth every evening.   [provider] Taking Active Self           Med Note Alinda Money, [place] May 06, 2016  9:50 AM)    Cholecalciferol (D3 ADULT PO) [id number] Yes Take 1,000 Units by mouth.  [provider] Taking Active   Cyanocobalamin (B-12 PO) [id number] Yes Take 1,000 mcg by mouth.  [provider] Taking Active   diltiazem (TIAZAC) 240 MG 24 hr capsule [id number] Yes TAKE 1 CAPSULE BY MOUTH EVERY DAY Crenshaw, Denice Bors, MD Taking Active   Glucosamine HCl (GLUCOSAMINE PO) [id number] Yes Take 1 tablet by mouth every evening. 15110m [provider] Taking Active Self  isosorbide mononitrate (IMDUR) 60 MG 24 hr tablet [id number] TAKE 1 AND 1/2 TABLETS(90 MG) BY MOUTH DAILY Crenshaw, BDenice Bors MD Taking Active   nitroGLYCERIN (NITROSTAT) 0.4 MG SL  tablet [id number] Yes PLACE 1 TABLET (0.4 MG TOTAL) UNDER THE TONGUE EVERY 5 (FIVE) MINUTES AS NEEDED FOR CHEST PAIN. Lelon Perla, MD Taking Active Self           Med Note Alinda Money, [place] May 06, 2016  9:50 AM) PRN  omeprazole (PRILOSEC) 20 MG capsule [id number] Yes Take 1 capsule (20 mg total) by mouth 2 (two) times daily. Patient needs office visit for further refills  Patient taking differently: Take 20 mg by mouth every other day.    Irene Shipper, MD Taking Active            Med Note Vivien Presto Apr 12, 2020  9:51 AM) Patient taking every 3rd in effort to reduce overall intake  potassium citrate (UROCIT-K) 10 MEQ (1080 MG) SR tablet [id number] Yes TAKE 2 TABLETS BY MOUTH DAILY Lelon Perla, MD Taking Active   rosuvastatin (CRESTOR) 40 MG tablet [id number] Yes TAKE 1 TABLET(40 MG) BY MOUTH DAILY [name] Denice Bors, MD Taking Active           Patient Active Problem List   Diagnosis Date Noted  . Remote history of stroke 06/03/2019  . Cholecystitis 01/16/2017  . Abdominal pain, epigastric 11/01/2016  . S/P BKA (below knee amputation) ([place]) 05/06/2016  . PCP NOTES >>>>>>>>>>>>>>>>>>> 02/07/2016  . DJD  (degenerative joint disease) 12/31/2013  . Annual physical exam 08/15/2011  . Hypogonadism male 08/12/2011  . GERD 04/05/2010  . ANXIETY 10/13/2009  . DYSPNEA 10/13/2009  . ABDOMINAL BRUIT 01/27/2009  . Essential hypertension 01/26/2009  . Hyperlipidemia 06/23/2007  . Obstructive sleep apnea 06/23/2007  . Coronary atherosclerosis 06/23/2007  . CALCULUS, KIDNEY 06/17/2007    Immunization History  Administered Date(s) Administered  . Fluad Quad(high Dose 65+) 06/04/2019  . HiB (PRP-OMP) 06/23/2007  . Influenza Split 08/15/2011  . Influenza, High Dose Seasonal PF 08/07/2016, 09/26/2017  . Influenza,inj,Quad PF,6+ Mos 06/06/2014, 09/22/2015  . Influenza-Unspecified 05/30/2018  . Meningococcal B, OMV 01/16/2017, 03/21/2017  . Meningococcal Conjugate 02/06/2016  . Meningococcal Polysaccharide 06/23/2007  . Moderna Sars-Covid-2 Vaccination 09/07/2019, 10/06/2019, 06/28/2020  . Pneumococcal Conjugate-13 09/22/2015  . Pneumococcal Polysaccharide-23 01/29/2013  . Td 08/26/2006, 09/26/2017  . Zoster 02/06/2016    Conditions to be addressed/monitored:  Pre-DM, HTN, HLD, CAD, GERD, Vitamin D Deficiency, Vitamin B12 Deficiency, OSA  There are no care plans that you recently modified to display for this patient.    Medication Assistance: {MEDASSISTANCEINFO:25044}  Patient's preferred pharmacy is:  [id number] [address], [address] AT [facility] OF [address] [place] [address] Phone: [phone] Fax: [phone]  Uses pill box? {Yes or If no, why not?:20788} Pt endorses ***% compliance  We discussed: {Pharmacy options:24294} Patient decided to: {US Pharmacy Plan:23885}  Care Plan and Follow Up Patient Decision:  {FOLLOWUP:24991}  Plan: {CM FOLLOW UP PLAN:25073}  ***   Current Barriers:  . {pharmacybarriers:24917} . ***  Pharmacist Clinical Goal(s):  Marland Kitchen Over the next *** days, patient will  {PHARMACYGOALCHOICES:24921} through collaboration with PharmD and provider.  . ***  Interventions: . 1:1 collaboration with Colon Branch, MD regarding development and update of comprehensive plan of care as evidenced by provider attestation and co-signature . Inter-disciplinary care team collaboration (see longitudinal plan of care) . Comprehensive medication review performed; medication list updated in electronic medical record  Hypertension (BP goal {CHL HP UPSTREAM Pharmacist BP ranges:[id number]}) -{CHL Controlled/Uncontrolled:3236870420} -Current treatment: . *** -  Medications previously tried: ***  -Current home readings: *** -Current dietary habits: *** -Current exercise habits: *** -{ACTIONS;DENIES/REPORTS:21021675::"Denies"} hypotensive/hypertensive symptoms -Educated on {CCM BP Counseling:25124} -Counseled to monitor BP at home ***, document, and provide log at future appointments -{CCMPHARMDINTERVENTION:25122}  Hyperlipidemia: (LDL goal < ***) -{CHL Controlled/[phone]} -Current treatment: . *** -Medications previously tried: ***  -Current dietary patterns: *** -Current exercise habits: *** -Educated on {CCM HLD Counseling:25126} -{CCMPHARMDINTERVENTION:25122}  Pre-Diabetes (A1c goal {A1c goals:23924}) -{CHL Controlled/[phone]} -Current medications: . *** -Medications previously tried: ***  -Current home glucose readings . fasting glucose: *** . post prandial glucose: *** -{ACTIONS;DENIES/REPORTS:21021675::"Denies"} hypoglycemic/hyperglycemic symptoms -Current meal patterns:  . breakfast: ***  . lunch: ***  . dinner: *** . snacks: *** . drinks: *** -Current exercise: *** -Educated on{CCM DM COUNSELING:25123} -Counseled to check feet daily and get yearly eye exams -{CCMPHARMDINTERVENTION:25122}   Patient Goals/Self-Care Activities . Over the next *** days, patient will:  - {pharmacypatientgoals:24919}  Follow Up Plan: {CM  FOLLOW UP NSQZ:83462}

## 2020-10-12 ENCOUNTER — Telehealth: Payer: PPO

## 2020-10-13 ENCOUNTER — Other Ambulatory Visit: Payer: Self-pay

## 2020-10-13 ENCOUNTER — Ambulatory Visit (INDEPENDENT_AMBULATORY_CARE_PROVIDER_SITE_OTHER): Payer: PPO | Admitting: Internal Medicine

## 2020-10-13 VITALS — BP 132/64 | HR 50 | Temp 98.0°F | Ht 65.5 in | Wt 192.0 lb

## 2020-10-13 DIAGNOSIS — R739 Hyperglycemia, unspecified: Secondary | ICD-10-CM

## 2020-10-13 DIAGNOSIS — Z89512 Acquired absence of left leg below knee: Secondary | ICD-10-CM

## 2020-10-13 DIAGNOSIS — Z7185 Encounter for immunization safety counseling: Secondary | ICD-10-CM | POA: Diagnosis not present

## 2020-10-13 DIAGNOSIS — E7800 Pure hypercholesterolemia, unspecified: Secondary | ICD-10-CM | POA: Diagnosis not present

## 2020-10-13 DIAGNOSIS — I2510 Atherosclerotic heart disease of native coronary artery without angina pectoris: Secondary | ICD-10-CM | POA: Diagnosis not present

## 2020-10-13 LAB — LIPID PANEL
Cholesterol: 123 mg/dL (ref 0–200)
HDL: 65.3 mg/dL (ref >39.00)
LDL Cholesterol: 42 mg/dL (ref 0–99)
NonHDL: 57.31
Total CHOL/HDL Ratio: 2
Triglycerides: 75 mg/dL (ref 0.0–149.0)
VLDL: 15 mg/dL (ref 0.0–40.0)

## 2020-10-13 LAB — BASIC METABOLIC PANEL
BUN: 21 mg/dL (ref 6–23)
CO2: 31 mEq/L (ref 19–32)
Calcium: 9.5 mg/dL (ref 8.4–10.5)
Chloride: 102 mEq/L (ref 96–112)
Creatinine, Ser: 1 mg/dL (ref 0.40–1.50)
GFR: 74.98 mL/min (ref >60.00)
Glucose, Bld: 106 mg/dL — ABNORMAL HIGH (ref 70–99)
Potassium: 5.1 mEq/L (ref 3.5–5.1)
Sodium: 138 mEq/L (ref 135–145)

## 2020-10-13 LAB — HEMOGLOBIN A1C: Hgb A1c MFr Bld: 5.7 % (ref 4.6–6.5)

## 2020-10-13 NOTE — Progress Notes (Signed)
Subjective:    Patient ID: Devin Martin, male    DOB: [birth year], 75 y.o.   MRN: [id number]  DOS:  10/13/2020 Type of visit - description: Routine checkup   Is a last office visit he is doing well except that has gained few pounds. Saw cardiology, note reviewed. No recent ambulatory BPs Has chronic issues with the skin of the BKA stump   Review of Systems No other concerns  Past Medical History:  Diagnosis Date  . Arthritis 1968   pt wears a lt bk prosthesis  . Bronchitis, chronic ([place])   . CAD (coronary artery disease)    Non-ST segment elevation myocardial infarction in 2005 with drug-eluting stent to the left anterior descending. 08-2009 - non-ST segment elevation myocardial infarction, felt secondary to vasospasm.  . Calculus, kidney    hx  . Complication of anesthesia    itching after preop med  ?name - 2013, no SOB  . Glaucoma   . Hyperlipidemia   . Hypertension   . Myocardial infarction [facility]) 2005   x2, 2005, 2010  . OSA (obstructive sleep apnea)    AHI 68/hr in 2002-uses a cpap  . Pneumonia    hx of   . Sleep apnea    wears CPAP  . Wears glasses     Past Surgical History:  Procedure Laterality Date  . 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 CARDIAC CATHETERIZATION    . CYST REMOVAL TRUNK  09/04/2012   Procedure: CYST REMOVAL TRUNK;  Surgeon: Ralene Ok, MD;  Location: [place] [place];  Service: General;  Laterality: N/A;  EXCISION OF MID BACK MASS  . EYE SURGERY Left DR[place]   CATARACT SX1/2019  . INCISION AND DRAINAGE ABSCESS Left 07/31/2013   Procedure: INCISIONAL/NON-INCISIONAL DEBRIDEMENT OF ABSCESS LEFT KNEE;  Surgeon: Mauri Pole, MD;  Location: [facility];  Service: Orthopedics;  Laterality: Left;  . KIDNEY STONE SURGERY     multiple episodes   . Right Knee Arthroscopy  2010   [place] [facility]  . TONSILLECTOMY      Allergies as of 10/13/2020      Reactions   Penicillins Hives, Other (See Comments)   syncope   Tylox  [oxycodone-acetaminophen] Other (See Comments)   "messes me up"   Codeine Nausea Only      Medication List       Accurate as of October 13, 2020  8:27 AM. If you have any questions, ask your nurse or doctor.        AMBULATORY NON FORMULARY MEDICATION ResMed CPAP Machine   AMBULATORY NON FORMULARY MEDICATION Prosthetic Leg Liners   Aspirin 81 MG EC tablet Take 81 mg by mouth every evening.   B-12 PO Take 1,000 mcg by mouth.   D3 ADULT PO Take 1,000 Units by mouth.   diltiazem 240 MG 24 hr capsule Commonly known as: TIAZAC Take 1 capsule (240 mg total) by mouth daily.   GLUCOSAMINE PO Take 1 tablet by mouth every evening. 1500mg    isosorbide mononitrate 60 MG 24 hr tablet Commonly known as: IMDUR TAKE 1 AND 1/2 TABLETS(90 MG) BY MOUTH DAILY   nitroGLYCERIN 0.4 MG SL tablet Commonly known as: Nitrostat PLACE 1 TABLET (0.4 MG TOTAL) UNDER THE TONGUE EVERY 5 (FIVE) MINUTES AS NEEDED FOR CHEST PAIN.   omeprazole 20 MG capsule Commonly known as: PRILOSEC Take 1 capsule (20 mg total) by mouth every other day. Patient needs office visit for further refills  potassium citrate 10 MEQ (1080 MG) SR tablet Commonly known as: UROCIT-K TAKE 2 TABLETS BY MOUTH DAILY   rosuvastatin 40 MG tablet Commonly known as: CRESTOR Take 1 tablet (40 mg total) by mouth daily.          Objective:   Physical Exam BP 132/64 (BP Location: Left Arm, Patient Position: Sitting, Cuff Size: Large)   Pulse (!) 50   Temp 98 F (36.7 C) (Oral)   Ht 5' 5.5" (1.664 m)   Wt 192 lb (87.1 kg)   SpO2 98%   BMI 31.46 kg/m  General:   Well developed, NAD, BMI noted. HEENT:  Normocephalic . Face symmetric, atraumatic Lungs:  CTA B Normal respiratory effort, no intercostal retractions, no accessory muscle use. Heart: RRR,  no murmur.  Lower extremities: History of a BKA, L, skin on the inner side of the left knee has mild erythema, no ulcers, hair follicles seems to be slightly  enlarged Neurologic:  alert & oriented X3.  Speech normal  Psych--  Cognition and judgment appear intact.  Cooperative with normal attention span and concentration.  Behavior appropriate. No anxious or depressed appearing.      Assessment     Assessment Hyperglycemia: A1c 5.01 February 2016 HTN Hyperlipidemia GERD , sporadic sx  Obesity  OSA, + CPAP CAD: NSTEMI 2005, stent. 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 2008 --meningococcal  2008, menactra booster 2017 L LE amputation d/t  trauma Cholelithiasis per ultrasound 09-2015 H/o Urolithiasis Vit D def Vit B 12 def  PLAN: Hyperglycemia: Has gained few pounds, he follows healthy diet, he is active.  Plans to work on portion sizes.  Check A1c HTN: Well-controlled today, recommend to check at home, continue Imdur, diltiazem. CAD: Saw cardiology 04/14/2020, felt to be stable.  Check BMP High cholesterol: Continue Crestor, FLP LLE amputation, Stump issues: Wonders if a dermatologist could remove the large hair follicle from the stump, I doubt it, that could lead to a scar even more problems, recommend to talk with his prosthesis provider. Preventive care : Extensive discussed regards Shingrix: Basically it will provide additional protection on top of previous Zostavax, there is indeed potential for s/e (rare).  It is his decision. RTC CPX 6 months  This visit occurred during the SARS-CoV-2 public health emergency.  Safety protocols were in place, including screening questions prior to the visit, additional usage of staff PPE, and extensive cleaning of exam room while observing appropriate contact time as indicated for disinfecting solutions.

## 2020-10-13 NOTE — Patient Instructions (Addendum)
Check the  blood pressure regularly. BP GOAL is between 110/65 and  135/85. If it is consistently higher or lower, let me know  GO TO THE LAB : Get the blood work    [place], [place] back for   a physical examination 6 months    Advance Directive  Advance directives are legal documents that allow you to make decisions about your health care and medical treatment in case you become unable to communicate for yourself. Advance directives let your wishes be known to family, friends, and health care providers. Discussing and writing advance directives should happen over time rather than all at once. Advance directives can be changed and updated at any time. There are different types of advance directives, such as:  Medical power of attorney.  Living will.  Do not resuscitate (DNR) order or do not attempt resuscitation (DNAR) order. Health care proxy and medical power of attorney A health care proxy is also called a health care agent. This person is appointed to make medical decisions for you when you are unable to make decisions for yourself. Generally, people ask a trusted friend or family member to act as their proxy and represent their preferences. Make sure you have an agreement with your trusted person to act as your proxy. A proxy may have to make a medical decision on your behalf if your wishes are not known. A medical power of attorney, also called a durable power of attorney for health care, is a legal document that names your health care proxy. Depending on the laws in your state, the document may need to be:  Signed.  Notarized.  Dated.  Copied.  Witnessed.  Incorporated into your medical record. You may also want to appoint a trusted person to manage your money in the event you are unable to do so. This is called a durable power of attorney for finances. It is a separate legal document from the durable power of attorney for health  care. You may choose your health care proxy or someone different to act as your agent in money matters. If you do not appoint a proxy, or there is a concern that the proxy is not acting in your best interest, a court may appoint a guardian to act on your behalf. Living will A living will is a set of instructions that state your wishes about medical care when you cannot express them yourself. Health care providers should keep a copy of your living will in your medical record. You may want to give a copy to family members or friends. To alert caregivers in case of an emergency, you can place a card in your wallet to let them know that you have a living will and where they can find it. A living will is used if you become:  Terminally ill.  Disabled.  Unable to communicate or make decisions. The following decisions should be included in your living will:  To use or not to use life support equipment, such as dialysis machines and breathing machines (ventilators).  Whether you want a DNR or DNAR order. This tells health care providers not to use cardiopulmonary resuscitation (CPR) if breathing or heartbeat stops.  To use or not to use tube feeding.  To be given or not to be given food and fluids.  Whether you want comfort (palliative) care when the goal becomes comfort rather than a cure.  Whether you want to donate your organs and  tissues. A living will does not give instructions for distributing your money and property if you should pass away. DNR or DNAR A DNR or DNAR order is a request not to have CPR in the event that your heart stops beating or you stop breathing. If a DNR or DNAR order has not been made and shared, a health care provider will try to help any patient whose heart has stopped or who has stopped breathing. If you plan to have surgery, talk with your health care provider about how your DNR or DNAR order will be followed if problems occur. What if I do not have an advance  directive? Some states assign family decision makers to act on your behalf if you do not have an advance directive. Each state has its own laws about advance directives. You may want to check with your health care provider, attorney, or state representative about the laws in your state. Summary  Advance directives are legal documents that allow you to make decisions about your health care and medical treatment in case you become unable to communicate for yourself.  The process of discussing and writing advance directives should happen over time. You can change and update advance directives at any time.  Advance directives may include a medical power of attorney, a living will, and a DNR or DNAR order. This information is not intended to replace advice given to you by your health care provider. Make sure you discuss any questions you have with your health care provider. Document Revised: 05/16/2020 Document Reviewed: 05/16/2020 Elsevier Patient Education  2021 [employer].

## 2020-10-14 ENCOUNTER — Encounter: Payer: Self-pay | Admitting: Internal Medicine

## 2020-10-15 ENCOUNTER — Encounter: Payer: Self-pay | Admitting: Internal Medicine

## 2020-10-17 DIAGNOSIS — L738 Other specified follicular disorders: Secondary | ICD-10-CM | POA: Diagnosis not present

## 2020-10-18 ENCOUNTER — Ambulatory Visit: Payer: PPO | Admitting: *Deleted

## 2020-10-18 NOTE — Progress Notes (Unsigned)
Subjective:   Devin Martin is a 75 y.o. male who presents for Medicare Annual/Subsequent preventive examination.  I connected with Renaldo today by telephone and verified that I am speaking with the correct person using two identifiers. Location patient: home Location provider: work Persons participating in the virtual visit: patient, Marine scientist.    I discussed the limitations, risks, security and privacy concerns of performing an evaluation and management service by telephone and the availability of in person appointments. I also discussed with the patient that there may be a patient responsible charge related to this service. The patient expressed understanding and verbally consented to this telephonic visit.    Interactive audio and video telecommunications were attempted between this provider and patient, however failed, due to patient having technical difficulties OR patient did not have access to video capability.  We continued and completed visit with audio only.  Some vital signs may be absent or patient reported.   Time Spent with patient on telephone encounter: 20 minutes   Review of Systems     Cardiac Risk Factors include: advanced age (>55men, >73 women);hypertension;dyslipidemia;obesity (BMI >30kg/m2)     Objective:    Today's Vitals   10/19/20 0940  Weight: 192 lb (87.1 kg)  Height: 5' 5.5" (1.664 m)   Body mass index is 31.46 kg/m.  Advanced Directives 10/19/2020 10/12/2019 10/09/2018 07/19/2018 11/25/2016 09/24/2016 02/21/2016  Does Patient Have a Medical Advance Directive? Yes Yes Yes No Yes Yes No  Type of Paramedic of [place];Living will [place];Living will [place];Living will - - [place];Living will -  Does patient want to make changes to medical advance directive? - No - Patient declined No - Patient declined - - - -  Copy of [place] in Chart? No - copy  requested No - copy requested Yes - validated most recent copy scanned in chart (See row information) - - No - copy requested -  Pre-existing out of facility DNR order (yellow form or pink MOST form) - - - - - - -    Current Medications (verified) Outpatient Encounter Medications as of 10/19/2020  Medication Sig  . AMBULATORY NON FORMULARY MEDICATION Prosthetic Leg Liners  . AMBULATORY NON FORMULARY MEDICATION ResMed CPAP Machine  . Aspirin 81 MG EC tablet Take 81 mg by mouth every evening.  . Cholecalciferol (D3 ADULT PO) Take 1,000 Units by mouth.   . Cyanocobalamin (B-12 PO) Take 1,000 mcg by mouth.   . diltiazem (TIAZAC) 240 MG 24 hr capsule Take 1 capsule (240 mg total) by mouth daily.  . Glucosamine HCl (GLUCOSAMINE PO) Take 1 tablet by mouth every evening. 1500mg   . isosorbide mononitrate (IMDUR) 60 MG 24 hr tablet TAKE 1 AND 1/2 TABLETS(90 MG) BY MOUTH DAILY  . nitroGLYCERIN (NITROSTAT) 0.4 MG SL tablet PLACE 1 TABLET (0.4 MG TOTAL) UNDER THE TONGUE EVERY 5 (FIVE) MINUTES AS NEEDED FOR CHEST PAIN.  Marland Kitchen omeprazole (PRILOSEC) 20 MG capsule Take 1 capsule (20 mg total) by mouth every other day. Patient needs office visit for further refills  . potassium citrate (UROCIT-K) 10 MEQ (1080 MG) SR tablet TAKE 2 TABLETS BY MOUTH DAILY  . rosuvastatin (CRESTOR) 40 MG tablet Take 1 tablet (40 mg total) by mouth daily.   No facility-administered encounter medications on file as of 10/19/2020.    Allergies (verified) Penicillins, Tylox [oxycodone-acetaminophen], and Codeine   History: Past Medical History:  Diagnosis Date  . 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 Bronchitis, chronic ([place])   . CAD (coronary artery disease)    Non-ST segment elevation myocardial infarction in 2005 with drug-eluting stent to the left anterior descending. 08-2009 - non-ST segment elevation myocardial infarction, felt secondary to vasospasm.  . Calculus, kidney    hx  . Complication of anesthesia     itching after preop med  ?name - 2013, no SOB  . Glaucoma   . Hyperlipidemia   . Hypertension   . Myocardial infarction [facility]) 2005   x2, 2005, 2010  . OSA (obstructive sleep apnea)    AHI 68/hr in 2002-uses a cpap  . Pneumonia    hx of   . Sleep apnea    wears CPAP  . Wears glasses    Past Surgical History:  Procedure Laterality Date  . 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 CARDIAC CATHETERIZATION    . CYST REMOVAL TRUNK  09/04/2012   Procedure: CYST REMOVAL TRUNK;  Surgeon: Ralene Ok, MD;  Location: [facility];  Service: General;  Laterality: N/A;  EXCISION OF MID BACK MASS  . EYE SURGERY Left DR.C[place]ona   CATARACT SX1/2019  . INCISION AND DRAINAGE ABSCESS Left 07/31/2013   Procedure: INCISIONAL/NON-INCISIONAL DEBRIDEMENT OF ABSCESS LEFT KNEE;  Surgeon: Mauri Pole, MD;  Location: [facility];  Service: Orthopedics;  Laterality: Left;  . KIDNEY STONE SURGERY     multiple episodes   . Right Knee Arthroscopy  2010   [facility]  . TONSILLECTOMY     Family History  Problem Relation Age of Onset  . Heart attack Father 44  . Heart attack Paternal Grandfather   . Cancer Mother        "male ca"  . Colon cancer Neg Hx   . Prostate cancer Neg Hx   . Diabetes Neg Hx   . Esophageal cancer Neg Hx   . Rectal cancer Neg Hx   . Stomach cancer Neg Hx    Social History   Socioeconomic History  . Marital status: Married    Spouse name: Not on file  . Number of children: 2  . Years of education: Not on file  . Highest education level: Not on file  Occupational History  . Occupation: Therapist, sports-- RETIRED 2012  Tobacco Use  . Smoking status: Former Smoker    Packs/day: 0.75    Years: 20.00    Pack years: 15.00    Types: Cigarettes, E-cigarettes  . Smokeless tobacco: Never Used  . Tobacco comment: currently doing some e-cigarrets    Vaping Use  . Vaping Use: Every day  Substance and Sexual Activity  . Alcohol use: Yes    Comment: bourbon 3  fingers 3 x/week.  . Drug use: No  . Sexual activity: Not Currently  Other Topics Concern  . Not on file  Social History Narrative   Lives w/ wife    Social Determinants of Health   Financial Resource Strain: Low Risk   . Difficulty of Paying Living Expenses: Not hard at all  Food Insecurity: No Food Insecurity  . Worried About Charity fundraiser in the Last Year: Never true  . Ran Out of Food in the Last Year: Never true  Transportation Needs: No Transportation Needs  . Lack of Transportation (Medical): No  . Lack of Transportation (Non-Medical): No  Physical Activity: Sufficiently Active  . Days of Exercise per Week: 7 days  . Minutes of  Exercise per Session: 60 min  Stress: No Stress Concern Present  . Feeling of Stress : Not at all  Social Connections: Socially Integrated  . Frequency of Communication with Friends and Family: More than three times a week  . Frequency of Social Gatherings with Friends and Family: More than three times a week  . Attends Religious Services: More than 4 times per year  . Active Member of Clubs or Organizations: Yes  . Attends Archivist Meetings: 1 to 4 times per year  . Marital Status: Married    Tobacco Counseling Counseling given: Not Answered Comment: currently doing some e-cigarrets     Clinical Intake:  Pre-visit preparation completed: Yes  Pain : No/denies pain     Nutritional Status: BMI > 30  Obese Nutritional Risks: None Diabetes: No  How often do you need to have someone help you when you read instructions, pamphlets, or other written materials from your doctor or pharmacy?: 1 - Never  Diabetic?No  Interpreter Needed?: No  Information entered by :: Caroleen Hamman LPN   Activities of Daily Living In your present state of health, do you have any difficulty performing the following activities: 10/19/2020 10/13/2020  Hearing? N N  Vision? N N  Difficulty concentrating or making decisions? N N  Walking or  climbing stairs? N N  Dressing or bathing? N N  Doing errands, shopping? N N  Preparing Food and eating ? N -  Using the Toilet? N -  In the past six months, have you accidently leaked urine? N -  Do you have problems with loss of bowel control? N -  Managing your Medications? N -  Managing your Finances? N -  Housekeeping or managing your Housekeeping? N -  Some recent data might be hidden    Patient Care Team: Colon Branch, MD as PCP - General (Internal Medicine) [name] Denice Bors, MD as Consulting Physician (Cardiology) Iona Beard, Dolores as Referring Physician (Optometry) Orie Rout as Referring Physician (Dentistry) Paralee Cancel, MD as Consulting Physician (Orthopedic Surgery) Fanny Skates, MD as Consulting Physician (General Surgery) Shawnie Dapper, DO as Consulting Physician (Ophthalmology) Day, Melvenia Beam, [facility] (Inactive) (Pharmacist)  Indicate any recent Medical Services you may have received from other than [facility] providers in the past year (date may be approximate).     Assessment:   This is a routine wellness examination for Nishant.  Hearing/Vision screen  Hearing Screening   125Hz  250Hz  500Hz  1000Hz  2000Hz  3000Hz  4000Hz  6000Hz  8000Hz   Right ear:           Left ear:           Comments: No issues  Vision Screening Comments: Wears glasses Last eye exam-6 months ago-Dr. Jerline Pain  Dietary issues and exercise activities discussed: Current Exercise Habits: Home exercise routine, Type of exercise: strength training/weights;Other - see comments;stretching (eliptical), Time (Minutes): 60, Frequency (Times/Week): 7, Weekly Exercise (Minutes/Week): 420, Intensity: Mild, Exercise limited by: None identified  Goals      Patient Stated   .  Maintain healthy active lifestyle. (pt-stated)      Other   .  A1c goal <6.5%      -Pre-Diabetes a1c range is 5.7% to 6.4%. Your most recent a1c was 6.2% on 10/12/19.  -Usually the full diabetes diagnosis is given when a1c reaches 6.5% or  higher.     .  Blood pressure goal less than 140/90 and greater than 90/60    .  Chronic Care Management Pharmacy Care Plan  CARE PLAN ENTRY (see longitudinal plan of care for additional care plan information)  Current Barriers:  . Chronic Disease Management support, education, and care coordination needs related to Pre-DM, HTN, HLD, CAD, GERD, Vitamin D Deficiency, Vitamin B12 Deficiency, OSA   Hypertension BP Readings from Last 3 Encounters:  04/12/20 132/62  10/12/19 (!) 122/53  04/09/19 110/60   . Pharmacist Clinical Goal(s): o Over the next 180 days, patient will work with PharmD and providers to maintain BP goal <140/90 . Current regimen:  o Diltiazem 240mg  daily . Patient self care activities - Over the next 180 days, patient will: o Maintain hypertension medication regimen.   Hyperlipidemia Lab Results  Component Value Date/Time   LDLCALC 51 10/12/2019 10:33 AM   LDLDIRECT 150.1 06/24/2007 09:00 AM   . Pharmacist Clinical Goal(s): o Over the next 180 days, patient will work with PharmD and providers to maintain LDL goal < 70 . Current regimen:  o Rosuvastatin 40mg  once daily . Patient self care activities - Over the next 180 days, patient will: o Maintain cholesterol medication regimen.   Pre-Diabetes Lab Results  Component Value Date/Time   HGBA1C 6.2 10/12/2019 10:33 AM   HGBA1C 5.6 10/09/2018 09:38 AM   . Pharmacist Clinical Goal(s): o Over the next 180 days, patient will work with PharmD and providers to maintain A1c goal <6.5% . Current regimen:  o Diet and exercise management   . Interventions: o Discussed to replace apple juice with reduced carb orange juice . Patient self care activities - Over the next 180 days, patient will: o Maintain a1c <6.5%  Medication management . Pharmacist Clinical Goal(s): o Over the next 180 days, patient will work with PharmD and providers to maintain optimal medication adherence . Current pharmacy:  [facility] . Interventions o Comprehensive medication review performed. o Continue current medication management strategy . Patient self care activities - Over the next 180 days, patient will: o Focus on medication adherence by filling and taking medications appropriately  o Take medications as prescribed o Report any questions or concerns to PharmD and/or provider(s)  Please see past updates related to this goal by clicking on the "Past Updates" button in the selected goal      .  Increase vitamin D to 2000 units daily    .  LDL goal <70    .  Replace apple juice with reduced carb orange juice    .  Start taking omeprazole every other day with goal to taper off of medication    .  Weight (lb) < 190 lb (86.2 kg)      Depression Screen PHQ 2/9 Scores 10/19/2020 10/13/2020 10/12/2019 10/09/2018 09/26/2017 09/24/2016 08/07/2016  PHQ - 2 Score 0 0 0 0 0 0 0    Fall Risk Fall Risk  10/19/2020 10/13/2020 10/12/2019 10/09/2018 09/26/2017  Falls in the past year? 0 0 0 0 No  Number falls in past yr: 0 0 0 - -  Injury with Fall? 0 0 0 - -  Risk for fall due to : History of fall(s) - - - -  Risk for fall due to: Comment - - - - -  Follow up Falls prevention discussed - Falls evaluation completed - -    FALL RISK PREVENTION PERTAINING TO THE HOME:  Any stairs in or around the home? Yes  If so, are there any without handrails? No  Home free of loose throw rugs in walkways, pet beds, electrical cords, etc? Yes  Adequate lighting in your  home to reduce risk of falls? Yes   ASSISTIVE DEVICES UTILIZED TO PREVENT FALLS:  Life alert? No  Use of a cane, walker or w/c? No  Grab bars in the bathroom? No  Shower chair or bench in shower? No  Elevated toilet seat or a handicapped toilet? No   TIMED UP AND GO:  Was the test performed? No . Phone visit   Cognitive Function:Normal cognitive status assessed by this Nurse Health Advisor. No abnormalities found.   MMSE - Mini Mental State Exam 09/26/2017  09/22/2015  Orientation to time 5 5  Orientation to Place 5 5  Registration 3 3  Attention/ Calculation 5 5  Recall 3 3  Language- name 2 objects 2 2  Language- repeat 1 1  Language- follow 3 step command 3 3  Language- read & follow direction 1 1  Write a sentence 1 1  Copy design 1 1  Total score 30 30        Immunizations Immunization History  Administered Date(s) Administered  . Fluad Quad(high Dose 65+) 06/04/2019  . HiB (PRP-OMP) 06/23/2007  . Influenza Split 08/15/2011  . Influenza, High Dose Seasonal PF 08/07/2016, 09/26/2017, 06/09/2020  . Influenza,inj,Quad PF,6+ Mos 06/06/2014, 09/22/2015  . Influenza-Unspecified 05/30/2018  . Meningococcal B, OMV 01/16/2017, 03/21/2017  . Meningococcal Conjugate 02/06/2016  . Meningococcal Polysaccharide 06/23/2007  . Moderna Sars-Covid-2 Vaccination 09/07/2019, 10/06/2019, 06/28/2020  . Pneumococcal Conjugate-13 09/22/2015  . Pneumococcal Polysaccharide-23 01/29/2013  . Td 08/26/2006, 09/26/2017  . Zoster 02/06/2016    TDAP status: Up to date  Flu Vaccine status: Up to date  Pneumococcal vaccine status: Up to date  Covid-19 vaccine status: Completed vaccines  Qualifies for Shingles Vaccine? Yes   Zostavax completed Yes   Shingrix Completed?: No.    Education has been provided regarding the importance of this vaccine. Patient has been advised to call insurance company to determine out of pocket expense if they have not yet received this vaccine. Advised may also receive vaccine at local pharmacy or Health Dept. Verbalized acceptance and understanding.  Screening Tests Health Maintenance  Topic Date Due  . COLONOSCOPY (Pts 45-29yrs Insurance coverage will need to be confirmed)  09/08/2024  . TETANUS/TDAP  09/27/2027  . INFLUENZA VACCINE  Completed  . COVID-19 Vaccine  Completed  . Hepatitis C Screening  Completed  . PNA vac Low Risk Adult  Completed  . Meningococcal B Vaccine  Discontinued    Health  Maintenance  There are no preventive care reminders to display for this patient.  Colorectal cancer screening: Type of screening: Colonoscopy. Completed 09/08/2014. Repeat every 10 years  Lung Cancer Screening: (Low Dose CT Chest recommended if Age 12-80 years, 30 pack-year currently smoking OR have quit w/in 15years.) does not qualify.     Additional Screening:  Hepatitis C Screening:Completed 09/26/2017  Vision Screening: Recommended annual ophthalmology exams for early detection of glaucoma and other disorders of the eye. Is the patient up to date with their annual eye exam?  Yes  Who is the provider or what is the name of the office in which the patient attends annual eye exams? Dr. Jerline Pain   Dental Screening: Recommended annual dental exams for proper oral hygiene  Community Resource Referral / Chronic Care Management: CRR required this visit?  No   CCM required this visit?  No      Plan:     I have personally reviewed and noted the following in the patient's chart:   . Medical and social history .  Use of alcohol, tobacco or illicit drugs  . Current medications and supplements . Functional ability and status . Nutritional status . Physical activity . Advanced directives . List of other physicians . Hospitalizations, surgeries, and ER visits in previous 12 months . Vitals . Screenings to include cognitive, depression, and falls . Referrals and appointments  In addition, I have reviewed and discussed with patient certain preventive protocols, quality metrics, and best practice recommendations. A written personalized care plan for preventive services as well as general preventive health recommendations were provided to patient.   Due to this being a telephonic visit, the after visit summary with patients personalized plan was offered to patient via mail or my-chart. Patient would like to access on my-chart.   Marta Antu, LPN   [id number]/76/1848  Nurse health  Advisor  Nurse Notes: None

## 2020-10-19 ENCOUNTER — Ambulatory Visit (INDEPENDENT_AMBULATORY_CARE_PROVIDER_SITE_OTHER): Payer: PPO

## 2020-10-19 VITALS — Ht 65.5 in | Wt 192.0 lb

## 2020-10-19 DIAGNOSIS — Z0000 Encounter for general adult medical examination without abnormal findings: Secondary | ICD-10-CM | POA: Diagnosis not present

## 2020-10-19 NOTE — Patient Instructions (Signed)
Devin Martin , Thank you for taking time to complete your Medicare Wellness Visit. I appreciate your ongoing commitment to your health goals. Please review the following plan we discussed and let me know if I can assist you in the future.   Screening recommendations/referrals: Colonoscopy: Completed 09/10/2014-Due 09/08/2024 Recommended yearly ophthalmology/optometry visit for glaucoma screening and checkup Recommended yearly dental visit for hygiene and checkup  Vaccinations: Influenza vaccine: Up to date Pneumococcal vaccine: Completed vaccines Tdap vaccine: Up to date-Due 09/27/2027 Shingles vaccine: Declined Covid-19: Completed vaccines  Advanced directives: Please bring a copy for your chart  Conditions/risks identified: See problem list  Next appointment: Follow up in one year for your annual wellness visit.   Preventive Care 13 Years and Older, Male Preventive care refers to lifestyle choices and visits with your health care provider that can promote health and wellness. What does preventive care include?  A yearly physical exam. This is also called an annual well check.  Dental exams once or twice a year.  Routine eye exams. Ask your health care provider how often you should have your eyes checked.  Personal lifestyle choices, including:  Daily care of your teeth and gums.  Regular physical activity.  Eating a healthy diet.  Avoiding tobacco and drug use.  Limiting alcohol use.  Practicing safe sex.  Taking low doses of aspirin every day.  Taking vitamin and mineral supplements as recommended by your health care provider. What happens during an annual well check? The services and screenings done by your health care provider during your annual well check will depend on your age, overall health, lifestyle risk factors, and family history of disease. Counseling  Your health care provider may ask you questions about your:  Alcohol use.  Tobacco use.  Drug  use.  Emotional well-being.  Home and relationship well-being.  Sexual activity.  Eating habits.  History of falls.  Memory and ability to understand (cognition).  Work and work Statistician. Screening  You may have the following tests or measurements:  Height, weight, and BMI.  Blood pressure.  Lipid and cholesterol levels. These may be checked every 5 years, or more frequently if you are over 71 years old.  Skin check.  Lung cancer screening. You may have this screening every year starting at age 59 if you have a 30-pack-year history of smoking and currently smoke or have quit within the past 15 years.  Fecal occult blood test (FOBT) of the stool. You may have this test every year starting at age 37.  Flexible sigmoidoscopy or colonoscopy. You may have a sigmoidoscopy every 5 years or a colonoscopy every 10 years starting at age 75.  Prostate cancer screening. Recommendations will vary depending on your family history and other risks.  Hepatitis C blood test.  Hepatitis B blood test.  Sexually transmitted disease (STD) testing.  Diabetes screening. This is done by checking your blood sugar (glucose) after you have not eaten for a while (fasting). You may have this done every 1-3 years.  Abdominal aortic aneurysm (AAA) screening. You may need this if you are a current or former smoker.  Osteoporosis. You may be screened starting at age 23 if you are at high risk. Talk with your health care provider about your test results, treatment options, and if necessary, the need for more tests. Vaccines  Your health care provider may recommend certain vaccines, such as:  Influenza vaccine. This is recommended every year.  Tetanus, diphtheria, and acellular pertussis (Tdap, Td) vaccine. You  may need a Td booster every 10 years.  Zoster vaccine. You may need this after age 33.  Pneumococcal 13-valent conjugate (PCV13) vaccine. One dose is recommended after age  42.  Pneumococcal polysaccharide (PPSV23) vaccine. One dose is recommended after age 59. Talk to your health care provider about which screenings and vaccines you need and how often you need them. This information is not intended to replace advice given to you by your health care provider. Make sure you discuss any questions you have with your health care provider. Document Released: 09/08/2015 Document Revised: 05/01/2016 Document Reviewed: 06/13/2015 Elsevier Interactive Patient Education  2017 [place] Prevention in the Home Falls can cause injuries. They can happen to people of all ages. There are many things you can do to make your home safe and to help prevent falls. What can I do on the outside of my home?  Regularly fix the edges of walkways and driveways and fix any cracks.  Remove anything that might make you trip as you walk through a door, such as a raised step or threshold.  Trim any bushes or trees on the path to your home.  Use bright outdoor lighting.  Clear any walking paths of anything that might make someone trip, such as rocks or tools.  Regularly check to see if handrails are loose or broken. Make sure that both sides of any steps have handrails.  Any raised decks and porches should have guardrails on the edges.  Have any leaves, snow, or ice cleared regularly.  Use sand or salt on walking paths during winter.  Clean up any spills in your garage right away. This includes oil or grease spills. What can I do in the bathroom?  Use night lights.  Install grab bars by the toilet and in the tub and shower. Do not use towel bars as grab bars.  Use non-skid mats or decals in the tub or shower.  If you need to sit down in the shower, use a plastic, non-slip stool.  Keep the floor dry. Clean up any water that spills on the floor as soon as it happens.  Remove soap buildup in the tub or shower regularly.  Attach bath mats securely with double-sided  non-slip rug tape.  Do not have throw rugs and other things on the floor that can make you trip. What can I do in the bedroom?  Use night lights.  Make sure that you have a light by your bed that is easy to reach.  Do not use any sheets or blankets that are too big for your bed. They should not hang down onto the floor.  Have a firm chair that has side arms. You can use this for support while you get dressed.  Do not have throw rugs and other things on the floor that can make you trip. What can I do in the kitchen?  Clean up any spills right away.  Avoid walking on wet floors.  Keep items that you use a lot in easy-to-reach places.  If you need to reach something above you, use a strong step stool that has a grab bar.  Keep electrical cords out of the way.  Do not use floor polish or wax that makes floors slippery. If you must use wax, use non-skid floor wax.  Do not have throw rugs and other things on the floor that can make you trip. What can I do with my stairs?  Do not leave any items  on the stairs.  Make sure that there are handrails on both sides of the stairs and use them. Fix handrails that are broken or loose. Make sure that handrails are as long as the stairways.  Check any carpeting to make sure that it is firmly attached to the stairs. Fix any carpet that is loose or worn.  Avoid having throw rugs at the top or bottom of the stairs. If you do have throw rugs, attach them to the floor with carpet tape.  Make sure that you have a light switch at the top of the stairs and the bottom of the stairs. If you do not have them, ask someone to add them for you. What else can I do to help prevent falls?  Wear shoes that:  Do not have high heels.  Have rubber bottoms.  Are comfortable and fit you well.  Are closed at the toe. Do not wear sandals.  If you use a stepladder:  Make sure that it is fully opened. Do not climb a closed stepladder.  Make sure that both  sides of the stepladder are locked into place.  Ask someone to hold it for you, if possible.  Clearly mark and make sure that you can see:  Any grab bars or handrails.  First and last steps.  Where the edge of each step is.  Use tools that help you move around (mobility aids) if they are needed. These include:  Canes.  Walkers.  Scooters.  Crutches.  Turn on the lights when you go into a dark area. Replace any light bulbs as soon as they burn out.  Set up your furniture so you have a clear path. Avoid moving your furniture around.  If any of your floors are uneven, fix them.  If there are any pets around you, be aware of where they are.  Review your medicines with your doctor. Some medicines can make you feel dizzy. This can increase your chance of falling. Ask your doctor what other things that you can do to help prevent falls. This information is not intended to replace advice given to you by your health care provider. Make sure you discuss any questions you have with your health care provider. Document Released: 06/08/2009 Document Revised: 01/18/2016 Document Reviewed: 09/16/2014 Elsevier Interactive Patient Education  2017 [employer].

## 2020-10-20 ENCOUNTER — Telehealth: Payer: Self-pay | Admitting: Pharmacist

## 2020-10-20 NOTE — Progress Notes (Signed)
    Chronic Care Management Pharmacy Assistant   Name: Devin Martin  MRN: [id number] DOB: August 05, 1946  Reason for Encounter: Adherence Review  PCP : Colon Branch, MD  Verified Adherence Gap Information. Per insurance data, the patient is 100% compliant with [facility] medication. Per insurance data patient has met their wellness bundle and annual wellness visit screening.The patient met their colorectal cancer screening as well.Their most recent A1C was 5.7 on 04/12/20 and their most recent blood pressure was 132/62 on 04/14/20. The patient met goal with keeping their blood pressure below 140/90.  Follow-Up:  Pharmacist Review   Charlann Lange, [place] Pharmacist Assistant [phone]

## 2020-10-23 ENCOUNTER — Telehealth: Payer: Self-pay | Admitting: Internal Medicine

## 2020-10-23 NOTE — Telephone Encounter (Signed)
Pt dropped off copy of his Power of Attorney ( 11 pages) Pt would like to have document put on his chart. Document put at front office tray.

## 2020-10-23 NOTE — Telephone Encounter (Signed)
Placed in PCP red folder for review.

## 2020-10-23 NOTE — Telephone Encounter (Signed)
Sent for scanning

## 2020-11-13 DIAGNOSIS — H53453 Other localized visual field defect, bilateral: Secondary | ICD-10-CM | POA: Diagnosis not present

## 2020-11-13 DIAGNOSIS — H524 Presbyopia: Secondary | ICD-10-CM | POA: Diagnosis not present

## 2020-11-13 DIAGNOSIS — Z961 Presence of intraocular lens: Secondary | ICD-10-CM | POA: Diagnosis not present

## 2020-12-05 DIAGNOSIS — S70352A Superficial foreign body, left thigh, initial encounter: Secondary | ICD-10-CM | POA: Diagnosis not present

## 2020-12-07 ENCOUNTER — Telehealth: Payer: Self-pay | Admitting: Internal Medicine

## 2020-12-07 NOTE — Telephone Encounter (Signed)
Okay with me, thank you 

## 2020-12-07 NOTE — Telephone Encounter (Signed)
Pt walked into office requesting TOC to Dr. Gena Fray for closer proximity to home. Please advise.

## 2021-01-09 ENCOUNTER — Telehealth: Payer: Self-pay | Admitting: Pharmacist

## 2021-01-09 NOTE — Chronic Care Management (AMB) (Signed)
Chronic Care Management Pharmacy Assistant   Name: Devin Martin  MRN: [id number] DOB: 1945-12-17   Reason for Encounter: Disease State Hypertension    Recent office visits:  10/13/2020 Kathlene November, MD (PCP) General follow up. Follow up in 6 months.  Recent consult visits:  10/17/2020 Orbie Hurst (Dermatology) No notes available.   Hospital visits:  None in previous 6 months  Medications: Outpatient Encounter Medications as of 01/09/2021  Medication Sig Note  . AMBULATORY NON FORMULARY MEDICATION Prosthetic Leg Liners   . AMBULATORY NON FORMULARY MEDICATION ResMed CPAP Machine   . Aspirin 81 MG EC tablet Take 81 mg by mouth every evening.   . Cholecalciferol (D3 ADULT PO) Take 1,000 Units by mouth.    . Cyanocobalamin (B-12 PO) Take 1,000 mcg by mouth.    . diltiazem (TIAZAC) 240 MG 24 hr capsule Take 1 capsule (240 mg total) by mouth daily.   . Glucosamine HCl (GLUCOSAMINE PO) Take 1 tablet by mouth every evening. 1500mg    . isosorbide mononitrate (IMDUR) 60 MG 24 hr tablet TAKE 1 AND 1/2 TABLETS(90 MG) BY MOUTH DAILY   . nitroGLYCERIN (NITROSTAT) 0.4 MG SL tablet PLACE 1 TABLET (0.4 MG TOTAL) UNDER THE TONGUE EVERY 5 (FIVE) MINUTES AS NEEDED FOR CHEST PAIN. 05/06/2016: PRN  . omeprazole (PRILOSEC) 20 MG capsule Take 1 capsule (20 mg total) by mouth every other day. Patient needs office visit for further refills   . potassium citrate (UROCIT-K) 10 MEQ (1080 MG) SR tablet TAKE 2 TABLETS BY MOUTH DAILY   . rosuvastatin (CRESTOR) 40 MG tablet Take 1 tablet (40 mg total) by mouth daily.    No facility-administered encounter medications on file as of 01/09/2021.   Reviewed chart prior to disease state call. Spoke with patient regarding BP  Recent Office Vitals: BP Readings from Last 3 Encounters:  10/13/20 132/64  04/14/20 132/62  04/12/20 132/62   Pulse Readings from Last 3 Encounters:  10/13/20 (!) 50  04/14/20 (!) 55  04/12/20 (!) 56    Wt Readings from Last 3  Encounters:  10/19/20 192 lb (87.1 kg)  10/13/20 192 lb (87.1 kg)  04/14/20 187 lb (84.8 kg)     Kidney Function Lab Results  Component Value Date/Time   CREATININE 1.00 10/13/2020 08:47 AM   CREATININE 0.99 04/12/2020 09:46 AM   CREATININE 0.91 01/29/2013 03:46 PM   GFR 74.98 10/13/2020 08:47 AM   GFRNONAA >60 10/30/2016 03:24 PM   GFRAA >60 10/30/2016 03:24 PM    BMP Latest Ref Rng & Units 10/13/2020 04/12/2020 10/12/2019  Glucose 70 - 99 mg/dL 106(H) 105(H) 105(H)  BUN 6 - 23 mg/dL 21 17 17   Creatinine 0.40 - 1.50 mg/dL 1.00 0.99 0.84  Sodium 135 - 145 mEq/L 138 138 138  Potassium 3.5 - 5.1 mEq/L 5.1 4.6 4.8  Chloride 96 - 112 mEq/L 102 103 103  CO2 19 - 32 mEq/L 31 28 31   Calcium 8.4 - 10.5 mg/dL 9.5 9.4 9.2    . Current antihypertensive regimen:  o Diltiazem 240 mg take 1 cap daily  . How often are you checking your Blood Pressure?   . Current home BP readings:   . What recent interventions/DTPs have been made by any provider to improve Blood Pressure control since last CPP Visit: None noted  . Any recent hospitalizations or ED visits since last visit with CPP? No   . What diet changes have been made to improve Blood Pressure Control?  o  .  What exercise is being done to improve your Blood Pressure Control?  o   Adherence Review: Is the patient currently on ACE/ARB medication? No Does the patient have >5 day gap between last estimated fill dates? No     Star Rating Drugs: Rosuvastatin 40 mg Last filled:12/08/2020 90 DS  Left voicemail x 5. Unable to reach patient.  Lizbeth Bark Clinical Pharmacist Assistant [phone]

## 2021-03-20 ENCOUNTER — Ambulatory Visit (INDEPENDENT_AMBULATORY_CARE_PROVIDER_SITE_OTHER): Payer: PPO | Admitting: Internal Medicine

## 2021-03-20 ENCOUNTER — Other Ambulatory Visit: Payer: Self-pay

## 2021-03-20 ENCOUNTER — Encounter: Payer: Self-pay | Admitting: Internal Medicine

## 2021-03-20 VITALS — BP 126/66 | HR 48 | Temp 98.4°F | Resp 16 | Ht 66.0 in | Wt 189.4 lb

## 2021-03-20 DIAGNOSIS — I10 Essential (primary) hypertension: Secondary | ICD-10-CM

## 2021-03-20 DIAGNOSIS — Z89512 Acquired absence of left leg below knee: Secondary | ICD-10-CM

## 2021-03-20 NOTE — Progress Notes (Signed)
Subjective:    Patient ID: Devin Martin, male    DOB: Feb 04, 1946, 75 y.o.   MRN: [id number]  DOS:  03/20/2021 Type of visit - description: Acute In need of a new prosthesis. Current one is broken.     Review of Systems See above   Past Medical History:  Diagnosis Date   Arthritis 1968   pt wears a lt bk prosthesis   Bronchitis, chronic (HCC)    CAD (coronary artery disease)    Non-ST segment elevation myocardial infarction in 2005 with drug-eluting stent to the left anterior descending. 08-2009 - non-ST segment elevation myocardial infarction, felt secondary to vasospasm.   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 Left    CARDIAC CATHETERIZATION     CYST REMOVAL TRUNK  09/04/2012   Procedure: CYST REMOVAL TRUNK;  Surgeon: Ralene Ok, MD;  Location: [facility];  Service: General;  Laterality: N/A;  EXCISION OF MID BACK MASS   EYE SURGERY Left DR.SON   CATARACT SX1/2019   INCISION AND DRAINAGE ABSCESS Left 07/31/2013   Procedure: INCISIONAL/NON-INCISIONAL DEBRIDEMENT OF ABSCESS LEFT KNEE;  Surgeon: Mauri Pole, MD;  Location: [facility];  Service: Orthopedics;  Laterality: Left;   KIDNEY STONE SURGERY     multiple episodes    Right Knee Arthroscopy  2010   [facility]   TONSILLECTOMY      Allergies as of 03/20/2021       Reactions   Penicillins Hives, Other (See Comments)   syncope   Tylox [oxycodone-acetaminophen] Other (See Comments)   "messes me up"   Codeine Nausea Only        Medication List        Accurate as of March 20, 2021  8:52 AM. If you  have any questions, ask your nurse or doctor.          AMBULATORY NON FORMULARY MEDICATION ResMed CPAP Machine   AMBULATORY NON FORMULARY MEDICATION Prosthetic Leg Liners   Aspirin 81 MG EC tablet Take 81 mg by mouth every evening.   B-12 PO Take 1,000 mcg by mouth.   D3 ADULT PO Take 1,000 Units by mouth.   diltiazem 240 MG 24 hr capsule Commonly known as: TIAZAC Take 1 capsule (240 mg total) by mouth daily.   GLUCOSAMINE PO Take 1 tablet by mouth every evening. '1500mg'$    isosorbide mononitrate 60 MG 24 hr tablet Commonly known as: IMDUR TAKE 1 AND 1/2 TABLETS(90 MG) BY MOUTH DAILY   nitroGLYCERIN 0.4 MG SL tablet Commonly known as: Nitrostat PLACE 1 TABLET (0.4 MG TOTAL) UNDER THE TONGUE EVERY 5 (FIVE) MINUTES AS NEEDED FOR CHEST PAIN.   omeprazole 20 MG capsule Commonly known as: PRILOSEC Take 1 capsule (20 mg total) by mouth every other day. Patient needs office visit for further refills   potassium citrate 10 MEQ (1080 MG) SR tablet Commonly known as: UROCIT-K TAKE 2 TABLETS BY MOUTH  DAILY   rosuvastatin 40 MG tablet Commonly known as: CRESTOR Take 1 tablet (40 mg total) by mouth daily.           Objective:   Physical Exam BP 126/66 (BP Location: Left Arm, Patient Position: Sitting, Cuff Size: Normal)   Pulse (!) 48   Temp 98.4 F (36.9 C) (Oral)   Resp 16   Ht '5\' 6"'$  (1.676 m)   Wt 189 lb 6 oz (85.9 kg)   SpO2 97%   BMI 30.57 kg/m  General:   Well developed, NAD, BMI noted. HEENT:  Normocephalic . Face symmetric, atraumatic Skin: Not pale. Not jaundice Neurologic:  alert & oriented X3.  Speech normal, gait consistent with L lower extremity prosthesis Psych--  Cognition and judgment appear intact.  Cooperative with normal attention span and concentration.  Behavior appropriate. No anxious or depressed appearing.      Assessment     Assessment Hyperglycemia: A1c 5.01 February 2016 HTN Hyperlipidemia GERD , sporadic sx  Obesity   OSA, + CPAP CAD: NSTEMI 2005, stent. 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 2008 --meningococcal  2008, menactra booster 2017 L LE amputation d/t  trauma Cholelithiasis per ultrasound 09-2015 H/o Urolithiasis Vit D def Vit B 12 def  PLAN: S/p L BKA: Needs a new prosthesis, the one he is currently using is broken.  Paperwork signed. HTN: BP today is very good, no change. RTC as needed, to see a new PCP soon   This visit occurred during the SARS-CoV-2 public health emergency.  Safety protocols were in place, including screening questions prior to the visit, additional usage of staff PPE, and extensive cleaning of exam room while observing appropriate contact time as indicated for disinfecting solutions.

## 2021-03-21 DIAGNOSIS — G4733 Obstructive sleep apnea (adult) (pediatric): Secondary | ICD-10-CM | POA: Diagnosis not present

## 2021-03-21 NOTE — Assessment & Plan Note (Signed)
S/p L BKA: Needs a new prosthesis, the one he is currently using is broken.  Paperwork signed. HTN: BP today is very good, no change. RTC as needed, to see a new PCP soon

## 2021-03-27 ENCOUNTER — Telehealth: Payer: Self-pay

## 2021-03-27 DIAGNOSIS — M1990 Unspecified osteoarthritis, unspecified site: Secondary | ICD-10-CM | POA: Diagnosis not present

## 2021-03-27 NOTE — Telephone Encounter (Signed)
Orders from [facility] signed and faxed back to [phone]. Form sent for scanning.

## 2021-04-04 DIAGNOSIS — M25561 Pain in right knee: Secondary | ICD-10-CM | POA: Diagnosis not present

## 2021-04-11 DIAGNOSIS — Z89512 Acquired absence of left leg below knee: Secondary | ICD-10-CM | POA: Diagnosis not present

## 2021-04-16 ENCOUNTER — Ambulatory Visit (INDEPENDENT_AMBULATORY_CARE_PROVIDER_SITE_OTHER): Payer: PPO | Admitting: Family Medicine

## 2021-04-16 ENCOUNTER — Encounter: Payer: Self-pay | Admitting: Family Medicine

## 2021-04-16 ENCOUNTER — Encounter: Payer: PPO | Admitting: Internal Medicine

## 2021-04-16 ENCOUNTER — Other Ambulatory Visit: Payer: Self-pay

## 2021-04-16 VITALS — BP 118/70 | HR 64 | Temp 98.7°F | Ht 66.0 in | Wt 190.6 lb

## 2021-04-16 DIAGNOSIS — I10 Essential (primary) hypertension: Secondary | ICD-10-CM

## 2021-04-16 DIAGNOSIS — Z89512 Acquired absence of left leg below knee: Secondary | ICD-10-CM | POA: Diagnosis not present

## 2021-04-16 DIAGNOSIS — I25119 Atherosclerotic heart disease of native coronary artery with unspecified angina pectoris: Secondary | ICD-10-CM

## 2021-04-16 DIAGNOSIS — Z87442 Personal history of urinary calculi: Secondary | ICD-10-CM

## 2021-04-16 DIAGNOSIS — E7800 Pure hypercholesterolemia, unspecified: Secondary | ICD-10-CM | POA: Diagnosis not present

## 2021-04-16 DIAGNOSIS — Z1321 Encounter for screening for nutritional disorder: Secondary | ICD-10-CM

## 2021-04-16 LAB — COMPREHENSIVE METABOLIC PANEL
ALT: 18 U/L (ref 0–53)
AST: 16 U/L (ref 0–37)
Albumin: 4.1 g/dL (ref 3.5–5.2)
Alkaline Phosphatase: 66 U/L (ref 39–117)
BUN: 13 mg/dL (ref 6–23)
CO2: 27 mEq/L (ref 19–32)
Calcium: 9.2 mg/dL (ref 8.4–10.5)
Chloride: 105 mEq/L (ref 96–112)
Creatinine, Ser: 0.82 mg/dL (ref 0.40–1.50)
GFR: 87.2 mL/min (ref >60.00)
Glucose, Bld: 102 mg/dL — ABNORMAL HIGH (ref 70–99)
Potassium: 4.3 mEq/L (ref 3.5–5.1)
Sodium: 140 mEq/L (ref 135–145)
Total Bilirubin: 1.4 mg/dL — ABNORMAL HIGH (ref 0.2–1.2)
Total Protein: 6.3 g/dL (ref 6.0–8.3)

## 2021-04-16 LAB — CBC
HCT: 43 % (ref 39.0–52.0)
Hemoglobin: 14.4 g/dL (ref 13.0–17.0)
MCHC: 33.4 g/dL (ref 30.0–36.0)
MCV: 94.3 fl (ref 78.0–100.0)
Platelets: 232 10*3/uL (ref 150.0–400.0)
RBC: 4.56 Mil/uL (ref 4.22–5.81)
RDW: 13.2 % (ref 11.5–15.5)
WBC: 5.7 10*3/uL (ref 4.0–10.5)

## 2021-04-16 LAB — LIPID PANEL
Cholesterol: 126 mg/dL (ref 0–200)
HDL: 68.7 mg/dL (ref >39.00)
LDL Cholesterol: 41 mg/dL (ref 0–99)
NonHDL: 57.17
Total CHOL/HDL Ratio: 2
Triglycerides: 79 mg/dL (ref 0.0–149.0)
VLDL: 15.8 mg/dL (ref 0.0–40.0)

## 2021-04-16 LAB — VITAMIN D 25 HYDROXY (VIT D DEFICIENCY, FRACTURES): VITD: 37.77 ng/mL (ref 30.00–100.00)
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
## 2021-05-15 NOTE — Progress Notes (Signed)
HPI: FU coronary artery disease. The patient underwent cardiac catheterization in December 2005 secondary to a non-ST elevation myocardial infarction. At that time, his ejection fraction was 60%. He had successful PCI of his mid LAD with a drug-eluting stent. Note, there was jailing of 2 small diagonals by the LAD stent. He underwent cardiac catheterization in January of 2011 following MI. This revealed an ejection fraction of 55-60%. The stent in the LAD was patent. The stent jails 2 diagonal branches, both diagonals have 90% ostial stenoses. There was a 25-30% circumflex. The right coronary artery had a 25-30% lesion. There was intense vasospasm and it was felt that his infarct was related to spasm. Abdominal ultrasound 2/17 showed no aneurysm. Nuclear study 10/17 showed EF 52 with septal infarct and mild apical ischemia; treated medically. Since I last saw him,  the patient denies any dyspnea on exertion, orthopnea, PND, pedal edema, palpitations, syncope or chest pain.   Current Outpatient Medications  Medication Sig Dispense Refill   AMBULATORY NON FORMULARY MEDICATION Prosthetic Leg Liners 2 application 0   AMBULATORY NON FORMULARY MEDICATION ResMed CPAP Machine     Aspirin 81 MG EC tablet Take 81 mg by mouth every evening.     Cholecalciferol (D3 ADULT PO) Take 1,000 Units by mouth.      Cyanocobalamin (B-12 PO) Take 1,000 mcg by mouth.      diltiazem (TIAZAC) 240 MG 24 hr capsule Take 1 capsule (240 mg total) by mouth daily. 90 capsule 3   Glucosamine HCl (GLUCOSAMINE PO) Take 1 tablet by mouth every evening. 1500mg      ibuprofen (ADVIL) 600 MG tablet Take 600 mg by mouth daily.     isosorbide mononitrate (IMDUR) 60 MG 24 hr tablet TAKE 1 AND 1/2 TABLETS(90 MG) BY MOUTH DAILY 135 tablet 3   omeprazole (PRILOSEC) 20 MG capsule Take 1 capsule (20 mg total) by mouth every other day. Patient needs office visit for further refills 45 capsule 3   potassium citrate (UROCIT-K) 10 MEQ (1080  MG) SR tablet TAKE 2 TABLETS BY MOUTH DAILY 180 tablet 3   rosuvastatin (CRESTOR) 40 MG tablet Take 1 tablet (40 mg total) by mouth daily. 90 tablet 3   meloxicam (MOBIC) 15 MG tablet Take 15 mg by mouth daily. Patient takes as needed (Patient not taking: Reported on 05/18/2021)     nitroGLYCERIN (NITROSTAT) 0.4 MG SL tablet PLACE 1 TABLET (0.4 MG TOTAL) UNDER THE TONGUE EVERY 5 (FIVE) MINUTES AS NEEDED FOR CHEST PAIN. 25 tablet 11   No current facility-administered medications for this visit.     Past Medical History:  Diagnosis Date   ANXIETY 10/13/2009   Qualifier: Diagnosis of  By: [name], MD, FACC, [place]   pt wears a lt bk prosthesis   Bronchitis, chronic ([place])    CAD (coronary artery disease)    Non-ST segment elevation myocardial infarction in 2005 with drug-eluting stent to the left anterior descending. 08-2009 - non-ST segment elevation myocardial infarction, felt secondary to vasospasm.   Calculus, kidney    hx   Complication of anesthesia    itching after preop med  ?name - 2013, no SOB   Glaucoma    Hyperlipidemia    Hypertension    Myocardial infarction [facility]) 2005   x2, 2005, 2010   OSA (obstructive sleep apnea)    AHI 68/hr in 2002-uses a cpap   Pneumonia    hx of    Sleep  apnea    wears CPAP   Wears glasses     Past Surgical History:  Procedure Laterality Date   AMPUT TRAUM LEG BELOW KNEE UNILT W/O COMPL Left    CARDIAC CATHETERIZATION     CYST REMOVAL TRUNK  09/04/2012   Procedure: CYST REMOVAL TRUNK;  Surgeon: Ralene Ok, MD;  Location: [facility];  Service: General;  Laterality: N/A;  EXCISION OF MID BACK MASS   EYE SURGERY Left DR.SON   CATARACT SX1/2019   INCISION AND DRAINAGE ABSCESS Left 07/31/2013   Procedure: INCISIONAL/NON-INCISIONAL DEBRIDEMENT OF ABSCESS LEFT KNEE;  Surgeon: Mauri Pole, MD;  [facility];  Service: Orthopedics;  Laterality: Left;   KIDNEY STONE SURGERY     multiple episodes     Right Knee Arthroscopy  2010   [facility]   TONSILLECTOMY      Social History   Socioeconomic History   Marital status: Married    Spouse name: Not on file   Number of children: 2   Years of education: Not on file   Highest education level: Not on file  Occupational History   Occupation: Programme researcher, broadcasting/film/video-- RETIRED 2012  Tobacco Use   Smoking status: Former    Packs/day: 0.75    Years: 20.00    Pack years: 15.00    Types: Cigarettes, E-cigarettes   Smokeless tobacco: Never   Tobacco comments:    currently doing some e-cigarettes  Vaping Use   Vaping Use: Every day   Substances: Nicotine  Substance and Sexual Activity   Alcohol use: Yes    Comment: bourbon 3 fingers 3 x/week.   Drug use: No   Sexual activity: Not Currently  Other Topics Concern   Not on file  Social History Narrative   Lives w/ wife    Social Determinants of Health   Financial Resource Strain: Not on file  Food Insecurity: No Food Insecurity   Worried About Charity fundraiser in the Last Year: Never true   Ran Out of Food in the Last Year: Never true  Transportation Needs: No Transportation Needs   Lack of Transportation (Medical): No   Lack of Transportation (Non-Medical): No  Physical Activity: Sufficiently Active   Days of Exercise per Week: 7 days   Minutes of Exercise per Session: 60 min  Stress: No Stress Concern Present   Feeling of Stress : Not at all  Social Connections: Socially Integrated   Frequency of Communication with Friends and Family: More than three times a week   Frequency of Social Gatherings with Friends and Family: More than three times a week   Attends Religious Services: More than 4 times per year   Active Member of [employer] or Organizations: Yes   Attends Archivist Meetings: 1 to 4 times per year   Marital Status: Married  Human resources officer Violence: Not At Risk   Fear of Current or Ex-Partner: No   Emotionally Abused: No   Physically Abused: No    Sexually Abused: No    Family History  Problem Relation Age of Onset   Stroke Mother    Cancer Mother        Ovary, Breast   Heart attack Father 51   Heart attack Paternal Grandfather    Colon cancer Neg Hx    Prostate cancer Neg Hx    Diabetes Neg Hx    Esophageal cancer Neg Hx    Rectal cancer Neg Hx    Stomach cancer Neg Hx  ROS: no fevers or chills, productive cough, hemoptysis, dysphasia, odynophagia, melena, hematochezia, dysuria, hematuria, rash, seizure activity, orthopnea, PND, pedal edema, claudication. Remaining systems are negative.  Physical Exam: Well-developed well-nourished in no acute distress.  Skin is warm and dry.  HEENT is normal.  Neck is supple.  Chest is clear to auscultation with normal expansion.  Cardiovascular exam is regular rate and rhythm.  Abdominal exam nontender or distended. No masses palpated. Extremities show no edema. S/P L AKA. neuro grossly intact  ECG-sinus bradycardia at a rate of 55, no ST changes personally reviewed  A/P  1 coronary artery disease-also with history of intense vasospasm on prior catheterization.  Patient denies chest pain.  Continue medical therapy with aspirin and statin.  Continue Cardizem and Imdur.  2 hypertension-blood pressure controlled.  Continue present medications and follow-up.  3 hyperlipidemia-continue statin.  Kirk Ruths, MD

## 2021-05-18 ENCOUNTER — Other Ambulatory Visit: Payer: Self-pay

## 2021-05-18 ENCOUNTER — Ambulatory Visit: Payer: PPO | Admitting: Cardiology

## 2021-05-18 ENCOUNTER — Encounter: Payer: Self-pay | Admitting: Cardiology

## 2021-05-18 VITALS — BP 128/64 | HR 55 | Ht 65.0 in | Wt 191.8 lb

## 2021-05-18 DIAGNOSIS — R072 Precordial pain: Secondary | ICD-10-CM | POA: Diagnosis not present

## 2021-05-18 DIAGNOSIS — I2510 Atherosclerotic heart disease of native coronary artery without angina pectoris: Secondary | ICD-10-CM | POA: Diagnosis not present

## 2021-05-18 DIAGNOSIS — E7800 Pure hypercholesterolemia, unspecified: Secondary | ICD-10-CM | POA: Diagnosis not present

## 2021-05-18 DIAGNOSIS — I10 Essential (primary) hypertension: Secondary | ICD-10-CM | POA: Diagnosis not present

## 2021-05-18 MED ORDER — NITROGLYCERIN 0.4 MG SL SUBL: SUBLINGUAL_TABLET | SUBLINGUAL | 11 refills | Status: DC

## 2021-05-18 MED ORDER — ISOSORBIDE MONONITRATE ER 60 MG PO TB24: ORAL_TABLET | ORAL | 3 refills | Status: DC

## 2021-05-18 MED ORDER — ROSUVASTATIN CALCIUM 40 MG PO TABS: 40.0000 mg | ORAL_TABLET | Freq: Every day | ORAL | 3 refills | Status: DC

## 2021-05-18 MED ORDER — POTASSIUM CITRATE ER 10 MEQ (1080 MG) PO TBCR: 20.0000 meq | EXTENDED_RELEASE_TABLET | Freq: Every day | ORAL | 3 refills | Status: DC

## 2021-05-18 MED ORDER — OMEPRAZOLE 20 MG PO CPDR: 20.0000 mg | DELAYED_RELEASE_CAPSULE | ORAL | 3 refills | Status: DC

## 2021-05-18 MED ORDER — DILTIAZEM HCL ER BEADS 240 MG PO CP24: 240.0000 mg | ORAL_CAPSULE | Freq: Every day | ORAL | 3 refills | Status: DC

## 2021-05-18 NOTE — Patient Instructions (Signed)

## 2021-06-02 DIAGNOSIS — R051 Acute cough: Secondary | ICD-10-CM | POA: Diagnosis not present

## 2021-06-02 DIAGNOSIS — Z20822 Contact with and (suspected) exposure to covid-19: Secondary | ICD-10-CM | POA: Diagnosis not present

## 2021-06-12 ENCOUNTER — Other Ambulatory Visit: Payer: Self-pay | Admitting: Cardiology

## 2021-06-25 DIAGNOSIS — Z89512 Acquired absence of left leg below knee: Secondary | ICD-10-CM | POA: Diagnosis not present

## 2021-07-18 ENCOUNTER — Other Ambulatory Visit: Payer: Self-pay

## 2021-07-18 ENCOUNTER — Ambulatory Visit (INDEPENDENT_AMBULATORY_CARE_PROVIDER_SITE_OTHER): Payer: PPO | Admitting: Family Medicine

## 2021-07-18 ENCOUNTER — Encounter: Payer: Self-pay | Admitting: Family Medicine

## 2021-07-18 VITALS — BP 118/76 | HR 51 | Temp 97.0°F | Ht 65.0 in | Wt 191.4 lb

## 2021-07-18 DIAGNOSIS — I25119 Atherosclerotic heart disease of native coronary artery with unspecified angina pectoris: Secondary | ICD-10-CM

## 2021-07-18 DIAGNOSIS — E7800 Pure hypercholesterolemia, unspecified: Secondary | ICD-10-CM

## 2021-07-18 DIAGNOSIS — I10 Essential (primary) hypertension: Secondary | ICD-10-CM | POA: Diagnosis not present
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
## 2021-09-01 ENCOUNTER — Other Ambulatory Visit: Payer: Self-pay | Admitting: Cardiology

## 2021-09-27 DIAGNOSIS — H26493 Other secondary cataract, bilateral: Secondary | ICD-10-CM | POA: Diagnosis not present

## 2021-10-06 ENCOUNTER — Other Ambulatory Visit: Payer: Self-pay | Admitting: Cardiology

## 2021-10-08 ENCOUNTER — Telehealth: Payer: Self-pay | Admitting: Family Medicine

## 2021-10-08 DIAGNOSIS — Z87442 Personal history of urinary calculi: Secondary | ICD-10-CM

## 2021-10-08 MED ORDER — POTASSIUM CITRATE ER 10 MEQ (1080 MG) PO TBCR: 20.0000 meq | EXTENDED_RELEASE_TABLET | Freq: Every day | ORAL | 3 refills | Status: DC

## 2021-10-08 NOTE — Telephone Encounter (Signed)
Pt called requesting a generic (cheaper) brand of the potassium citrate.

## 2021-10-09 NOTE — Telephone Encounter (Signed)
Patient notified VIA phone. No further questions. Dm/cma  

## 2021-10-09 NOTE — Telephone Encounter (Signed)
Noted. Dm/cma  

## 2021-10-09 NOTE — Telephone Encounter (Signed)
Pt is saying his insurance company let him know-they will make a Tier Exception for his potassium citrate (UROCIT-K) 10 MEQ (1080 MG) SR tablet [[id number]]. His insurance company is sending over a PA. He wants [place] to be aware of this.

## 2021-10-15 NOTE — Telephone Encounter (Signed)
Patient was approved for a lower teir (teir2) form 10/13/21 - 08/26/23. Dm/cma

## 2021-10-19 ENCOUNTER — Ambulatory Visit (INDEPENDENT_AMBULATORY_CARE_PROVIDER_SITE_OTHER): Payer: PPO | Admitting: Family Medicine

## 2021-10-19 ENCOUNTER — Encounter: Payer: Self-pay | Admitting: Family Medicine

## 2021-10-19 ENCOUNTER — Other Ambulatory Visit: Payer: Self-pay

## 2021-10-19 VITALS — BP 132/70 | HR 85 | Temp 97.6°F | Ht 65.0 in | Wt 190.6 lb

## 2021-10-19 DIAGNOSIS — M1711 Unilateral primary osteoarthritis, right knee: Secondary | ICD-10-CM

## 2021-10-19 DIAGNOSIS — I25119 Atherosclerotic heart disease of native coronary artery with unspecified angina pectoris: Secondary | ICD-10-CM

## 2021-10-19 DIAGNOSIS — R7303 Prediabetes: Secondary | ICD-10-CM | POA: Diagnosis not present

## 2021-10-19 DIAGNOSIS — E782 Mixed hyperlipidemia: Secondary | ICD-10-CM

## 2021-10-19 DIAGNOSIS — E669 Obesity, unspecified: Secondary | ICD-10-CM

## 2021-10-19 DIAGNOSIS — I10 Essential (primary) hypertension: Secondary | ICD-10-CM

## 2021-10-19 LAB — LIPID PANEL
Cholesterol: 115 mg/dL (ref 0–200)
HDL: 62.4 mg/dL (ref >39.00)
LDL Cholesterol: 40 mg/dL (ref 0–99)
NonHDL: 52.28
Total CHOL/HDL Ratio: 2
Triglycerides: 62 mg/dL (ref 0.0–149.0)
VLDL: 12.4 mg/dL (ref 0.0–40.0)

## 2021-10-19 LAB — HEMOGLOBIN A1C: Hgb A1c MFr Bld: 5.8 % (ref 4.6–6.5)

## 2021-10-19 NOTE — Progress Notes (Signed)
[facility] [address] [place] [address] Dept: [phone] Dept Fax: [phone]  Chronic Care Office Visit  Subjective:    Patient ID: Devin Martin, male    DOB: 09/29/1945, 76 y.o..   MRN: [id number]  Chief Complaint  Patient presents with   Follow-up    3 month f/u HTN/chol.     History of Present Illness:  Patient is in today for reassessment of chronic medical issues.  [name] has a history of a prior MI with stent placement. He is managed by Dr. [name] (cardiology). He also has hypertension and hyperlipidemia. He is currently on aspirin, diltiazem, isosorbide, and rosuvastatin. He denies any chest pain.  [name] notes concerns over his weight. He feels like he has been unable to lose the 5 lbs. he feels he needs to lose to feel his best. He engages in nearly daily workouts, including 20 min. of elliptical (cardio), stretching, weight training, and golf-specific training. he also golfs twice a week (rides a cart due to his leg amputation).  He also describes a healthy diet, though he only eats twice a day. He only has one alcohol drink a day. He asks about supplements to help him lose weight.   [name] has a history of osteoarthritis of his right knee. He was started on Mobic by an orthopedist. He occasionally takes a dose and finds this works quite well. He still has 25 of 30 tabs originally prescribed. He wonders if I will renew this when he needs it.  Past Medical History: Patient Active Problem List   Diagnosis Date Noted   Obesity (BMI 30-39.9) 10/19/2021   Remote history of stroke 06/03/2019   Cholecystitis 01/16/2017   Abdominal pain, epigastric 11/01/2016   S/P BKA (below knee amputation) ([place]) 05/06/2016   Osteoarthritis of right knee 12/31/2013   Hypogonadism male 08/12/2011   GERD 04/05/2010   Essential hypertension 01/26/2009   Hyperlipidemia 06/23/2007   Obstructive sleep apnea  06/23/2007   Coronary artery disease involving native coronary artery of native heart with angina pectoris ([place]) 06/23/2007   History of kidney stones 06/17/2007   Past Surgical History:  Procedure Laterality Date   AMPUT TRAUM LEG BELOW KNEE UNILT W/O COMPL Left    CARDIAC CATHETERIZATION     CYST REMOVAL TRUNK  09/04/2012   Procedure: CYST REMOVAL TRUNK;  Surgeon: Ralene Ok, MD;  Location: [facility];  Service: General;  Laterality: N/A;  EXCISION OF MID BACK MASS   EYE SURGERY Left DR.SON   CATARACT SX1/2019   INCISION AND DRAINAGE ABSCESS Left 07/31/2013   Procedure: INCISIONAL/NON-INCISIONAL DEBRIDEMENT OF ABSCESS LEFT KNEE;  Surgeon: Mauri Pole, MD;  Location: [facility];  Service: Orthopedics;  Laterality: Left;   KIDNEY STONE SURGERY     multiple episodes    Right Knee Arthroscopy  2010   [place] [facility]   TONSILLECTOMY     Family History  Problem Relation Age of Onset   Stroke Mother    Cancer Mother        Ovary, Breast   Heart attack Father 80   Heart attack Paternal Grandfather    Colon cancer Neg Hx    Prostate cancer Neg Hx    Diabetes Neg Hx    Esophageal cancer Neg Hx    Rectal cancer Neg Hx    Stomach cancer Neg Hx    Outpatient Medications Prior to Visit  Medication Sig Dispense Refill   AMBULATORY NON  FORMULARY MEDICATION Prosthetic Leg Liners 2 application 0   AMBULATORY NON FORMULARY MEDICATION ResMed CPAP Machine     Aspirin 81 MG EC tablet Take 81 mg by mouth every evening.     Cholecalciferol (D3 ADULT PO) Take 1,000 Units by mouth.      Cyanocobalamin (B-12 PO) Take 1,000 mcg by mouth.      diltiazem (TIAZAC) 240 MG 24 hr capsule Take 1 capsule (240 mg total) by mouth daily. 90 capsule 3   Glucosamine HCl (GLUCOSAMINE PO) Take 1 tablet by mouth every evening. 1500mg      ibuprofen (ADVIL) 600 MG tablet Take 600 mg by mouth daily.     isosorbide mononitrate (IMDUR) 60 MG 24 hr tablet TAKE 1 AND 1/2 TABLETS(90 MG) BY MOUTH DAILY 135  tablet 3   meloxicam (MOBIC) 15 MG tablet Take 15 mg by mouth daily. Patient takes as needed     nitroGLYCERIN (NITROSTAT) 0.4 MG SL tablet PLACE 1 TABLET (0.4 MG TOTAL) UNDER THE TONGUE EVERY 5 (FIVE) MINUTES AS NEEDED FOR CHEST PAIN. 25 tablet 11   omeprazole (PRILOSEC) 20 MG capsule Take 1 capsule (20 mg total) by mouth every other day. Patient needs office visit for further refills 45 capsule 3   potassium citrate (UROCIT-K) 10 MEQ (1080 MG) SR tablet Take 2 tablets (20 mEq total) by mouth daily. 180 tablet 3   rosuvastatin (CRESTOR) 40 MG tablet TAKE 1 TABLET(40 MG) BY MOUTH DAILY 90 tablet 3   No facility-administered medications prior to visit.   Allergies  Allergen Reactions   Penicillins Hives and Other (See Comments)    syncope   Tylox [Oxycodone-Acetaminophen] Other (See Comments)    "messes me up"   Codeine Nausea Only     Objective:   Today's Vitals   10/19/21 0818  BP: 132/70  Pulse: 85  Temp: 97.6 F (36.4 C)  TempSrc: Temporal  SpO2: 96%  Weight: 190 lb 9.6 oz (86.5 kg)  Height: 5\' 5"  (1.651 m)   Body mass index is 31.72 kg/m.   General: Well developed, well nourished. No acute distress. Psych: Alert and oriented x3. Normal mood and affect.  Health Maintenance Due  Topic Date Due   Zoster Vaccines- Shingrix (1 of 2) Never done     BMP Latest Ref Rng & Units 04/16/2021 10/13/2020 04/12/2020  Glucose 70 - 99 mg/dL 102(H) 106(H) 105(H)  BUN 6 - 23 mg/dL 13 21 17   Creatinine 0.40 - 1.50 mg/dL 0.82 1.00 0.99  Sodium 135 - 145 mEq/L 140 138 138  Potassium 3.5 - 5.1 mEq/L 4.3 5.1 4.6  Chloride 96 - 112 mEq/L 105 102 103  CO2 19 - 32 mEq/L 27 31 28   Calcium 8.4 - 10.5 mg/dL 9.2 9.5 9.4   Lab Results  Component Value Date   HGBA1C 5.7 10/13/2020   Lab Results  Component Value Date   CHOL 126 04/16/2021   HDL 68.70 04/16/2021   LDLCALC 41 04/16/2021   LDLDIRECT 150.1 06/24/2007   TRIG 79.0 04/16/2021   CHOLHDL 2 04/16/2021   Assessment & Plan:   1.  Mixed hyperlipidemia We will repeat fasting lipids today. He will continue rosuvastatin.  - Lipid panel  2. Coronary artery disease involving native coronary artery of native heart with angina pectoris (HCC) Stable. Continue aspirin and isosorbide.  3. Essential hypertension Blood pressure at goal. Continue diltiazem.  4. Primary osteoarthritis of right knee I am fine with him taking an occasional Mobic and will continue this when  he needs it.  5. Obesity (BMI 30-34.9) Discussed lack of efficacy of supplements for weight loss. We discussed his efforts at a healthy lifestyle. I think he should continue to focus on eating a healthy diet and getting routine physical activity.  6. Prediabetes Due for repeat A1c.  - Hemoglobin A1c   Return in about 6 months (around 04/18/2022) for Reassessment.   Haydee Salter, MD

## 2021-12-28 ENCOUNTER — Telehealth: Payer: Self-pay | Admitting: Family Medicine

## 2021-12-28 NOTE — Telephone Encounter (Signed)
Left message for patient to call back and schedule Medicare Annual Wellness Visit (AWV).  ? ?Please offer to do virtually or by telephone.  Left office number and my jabber [phone]. ? ?Last AWV:10/19/2020 ? ?Please schedule at anytime with Nurse Health Advisor. ?  ?

## 2022-01-09 ENCOUNTER — Ambulatory Visit (INDEPENDENT_AMBULATORY_CARE_PROVIDER_SITE_OTHER): Payer: PPO

## 2022-01-09 DIAGNOSIS — Z0000 Encounter for general adult medical examination without abnormal findings: Secondary | ICD-10-CM | POA: Diagnosis not present

## 2022-01-09 NOTE — Progress Notes (Signed)
? ?Subjective:  ? Devin Martin is a 76 y.o. male who presents for an Subsequent Medicare Annual Wellness Visit. ? ? ?I connected with [name]  today by telephone and verified that I am speaking with the correct person using two identifiers. ?Location patient: home ?Location provider: work ?Persons participating in the virtual visit: patient, provider. ?  ?I discussed the limitations, risks, security and privacy concerns of performing an evaluation and management service by telephone and the availability of in person appointments. I also discussed with the patient that there may be a patient responsible charge related to this service. The patient expressed understanding and verbally consented to this telephonic visit.  ?  ?Interactive audio and video telecommunications were attempted between this provider and patient, however failed, due to patient having technical difficulties OR patient did not have access to video capability.  We continued and completed visit with audio only. ? ?  ?Review of Systems    ? ?Cardiac Risk Factors include: advanced age (>58mn, >>34women);male gender ? ?   ?Objective:  ?  ?Today's Vitals  ? ?There is no height or weight on file to calculate BMI. ? ? ?  01/09/2022  ?  8:56 AM 10/19/2020  ?  9:43 AM 10/12/2019  ? 12:07 PM 10/09/2018  ?  8:21 AM 07/19/2018  ?  8:22 AM 11/25/2016  ?  1:20 PM 09/24/2016  ? 11:23 AM  ?Advanced Directives  ?Does Patient Have a Medical Advance Directive? Yes Yes Yes Yes No Yes Yes  ?Type of AParamedicof [place]Living will [place]Living will [place]Living will [place]Living will   [place]ickoryLiving will  ?Does patient want to make changes to medical advance directive?   No - Patient declined No - Patient declined     ?Copy of [place] Chart? No - copy requested No - copy requested No - copy requested Yes - validated most  recent copy scanned in chart (See row information)   No - copy requested  ? ? ?Current Medications (verified) ?Outpatient Encounter Medications as of 01/09/2022  ?Medication Sig  ? AMBULATORY NON FORMULARY MEDICATION Prosthetic Leg Liners  ? AMBULATORY NON FORMULARY MEDICATION ResMed CPAP Machine  ? Aspirin 81 MG EC tablet Take 81 mg by mouth every evening.  ? Cholecalciferol (D3 ADULT PO) Take 1,000 Units by mouth.   ? Cyanocobalamin (B-12 PO) Take 1,000 mcg by mouth.   ? diltiazem (TIAZAC) 240 MG 24 hr capsule Take 1 capsule (240 mg total) by mouth daily.  ? ibuprofen (ADVIL) 600 MG tablet Take 600 mg by mouth daily.  ? isosorbide mononitrate (IMDUR) 60 MG 24 hr tablet TAKE 1 AND 1/2 TABLETS(90 MG) BY MOUTH DAILY  ? meloxicam (MOBIC) 15 MG tablet Take 15 mg by mouth daily. Patient takes as needed  ? nitroGLYCERIN (NITROSTAT) 0.4 MG SL tablet PLACE 1 TABLET (0.4 MG TOTAL) UNDER THE TONGUE EVERY 5 (FIVE) MINUTES AS NEEDED FOR CHEST PAIN.  ? omeprazole (PRILOSEC) 20 MG capsule Take 1 capsule (20 mg total) by mouth every other day. Patient needs office visit for further refills  ? potassium citrate (UROCIT-K) 10 MEQ (1080 MG) SR tablet Take 2 tablets (20 mEq total) by mouth daily.  ? rosuvastatin (CRESTOR) 40 MG tablet TAKE 1 TABLET(40 MG) BY MOUTH DAILY  ? Turmeric (QC TUMERIC COMPLEX) 500 MG CAPS Take by mouth.  ? Glucosamine HCl (GLUCOSAMINE PO) Take 1 tablet  by mouth every evening. '1500mg'$  (Patient not taking: Reported on 01/09/2022)  ? ?No facility-administered encounter medications on file as of 01/09/2022.  ? ? ?Allergies (verified) ?Penicillins, Tylox [oxycodone-acetaminophen], and Codeine  ? ?History: ?Past Medical History:  ?Diagnosis Date  ? ANXIETY 10/13/2009  ? Qualifier: Diagnosis of  By: [name], MD, Kandyce Rud   ? Arthritis 1968  ? pt wears a lt bk prosthesis  ? Bronchitis, chronic ([place])   ? CAD (coronary artery disease)   ? Non-ST segment elevation myocardial infarction in 2005 with drug-eluting  stent to the left anterior descending. 08-2009 - non-ST segment elevation myocardial infarction, felt secondary to vasospasm.  ? Calculus, kidney   ? hx  ? Complication of anesthesia   ? itching after preop med  ?name - 2013, no SOB  ? Glaucoma   ? Hyperlipidemia   ? Hypertension   ? Myocardial infarction [facility]) 2005  ? x2, 2005, 2010  ? OSA (obstructive sleep apnea)   ? AHI 68/hr in 2002-uses a cpap  ? Pneumonia   ? hx of   ? Sleep apnea   ? wears CPAP  ? Wears glasses   ? ?Past Surgical History:  ?Procedure Laterality Date  ? AMPUT TRAUM LEG BELOW KNEE UNILT W/O COMPL Left   ? CARDIAC CATHETERIZATION    ? CYST REMOVAL TRUNK  09/04/2012  ? Procedure: CYST REMOVAL TRUNK;  Surgeon: Ralene Ok, MD;  Location: [facility];  Service: General;  Laterality: N/A;  EXCISION OF MID BACK MASS  ? EYE SURGERY Left DR.SON  ? CATARACT SX1/2019  ? INCISION AND DRAINAGE ABSCESS Left 07/31/2013  ? Procedure: INCISIONAL/NON-INCISIONAL DEBRIDEMENT OF ABSCESS LEFT KNEE;  Surgeon: Mauri Pole, MD;  [facility];  Service: Orthopedics;  Laterality: Left;  ? KIDNEY STONE SURGERY    ? multiple episodes   ? Right Knee Arthroscopy  2010  ? [place] [facility]  ? TONSILLECTOMY    ? ?Family History  ?Problem Relation Age of Onset  ? Stroke Mother   ? Cancer Mother   ?     Ovary, Breast  ? Heart attack Father 42  ? Heart attack Paternal Grandfather   ? Colon cancer Neg Hx   ? Prostate cancer Neg Hx   ? Diabetes Neg Hx   ? Esophageal cancer Neg Hx   ? Rectal cancer Neg Hx   ? Stomach cancer Neg Hx   ? ?Social History  ? ?Socioeconomic History  ? Marital status: Married  ?  Spouse name: Not on file  ? Number of children: 2  ? Years of education: Not on file  ? Highest education level: Not on file  ?Occupational History  ? Occupation: Programme researcher, broadcasting/film/video-- [employer]  ?Tobacco Use  ? Smoking status: Former  ?  Packs/day: 0.75  ?  Years: 20.00  ?  Pack years: 15.00  ?  Types: Cigarettes, E-cigarettes  ? Smokeless tobacco: Never  ?  Tobacco comments:  ?  currently doing some e-cigarettes  ?Vaping Use  ? Vaping Use: Every day  ? Substances: Nicotine  ?Substance and Sexual Activity  ? Alcohol use: Yes  ?  Comment: bourbon 3 fingers 3 x/week.  ? Drug use: No  ? Sexual activity: Not Currently  ?Other Topics Concern  ? Not on file  ?Social History Narrative  ? Lives w/ wife   ? ?Social Determinants of Health  ? ?Financial Resource Strain: Low Risk   ? Difficulty of Paying Living Expenses: Not hard  at all  ?Food Insecurity: No Food Insecurity  ? Worried About Charity fundraiser in the Last Year: Never true  ? Ran Out of Food in the Last Year: Never true  ?Transportation Needs: No Transportation Needs  ? Lack of Transportation (Medical): No  ? Lack of Transportation (Non-Medical): No  ?Physical Activity: Sufficiently Active  ? Days of Exercise per Week: 5 days  ? Minutes of Exercise per Session: 60 min  ?Stress: No Stress Concern Present  ? Feeling of Stress : Not at all  ?Social Connections: Socially Integrated  ? Frequency of Communication with Friends and Family: Three times a week  ? Frequency of Social Gatherings with Friends and Family: Three times a week  ? Attends Religious Services: More than 4 times per year  ? Active Member of Clubs or Organizations: Yes  ? Attends Archivist Meetings: More than 4 times per year  ? Marital Status: Married  ? ? ?Tobacco Counseling ?Counseling given: Not Answered ?Tobacco comments: currently doing some e-cigarettes ? ? ?Clinical Intake: ? ?Pre-visit preparation completed: Yes ? ?Pain : No/denies pain ? ?  ? ?Nutritional Risks: None ?Diabetes: No ? ?How often do you need to have someone help you when you read instructions, pamphlets, or other written materials from your doctor or pharmacy?: 1 - Never ?What is the last grade level you completed in school?: college ? ?Diabetic?no  ? ?Interpreter Needed?: No ? ?Information entered by :: [email or web address] ? ? ?Activities of Daily Living ? ?  01/09/2022  ?   9:00 AM  ?In your present state of health, do you have any difficulty performing the following activities:  ?Hearing? 0  ?Vision? 0  ?Difficulty concentrating or making decisions? 0  ?Walking or climbing stairs?

## 2022-01-09 NOTE — Patient Instructions (Signed)
Devin Martin , ?Thank you for taking time to come for your Medicare Wellness Visit. I appreciate your ongoing commitment to your health goals. Please review the following plan we discussed and let me know if I can assist you in the future.  ? ?Screening recommendations/referrals: ?Colonoscopy: 09/08/2014 ?Recommended yearly ophthalmology/optometry visit for glaucoma screening and checkup ?Recommended yearly dental visit for hygiene and checkup ? ?Vaccinations: ?Influenza vaccine: completed  ?Pneumococcal vaccine: completed  ?Tdap vaccine: 09/26/2017 ?Shingles vaccine: will consider    ? ?Advanced directives: yes ? ?Conditions/risks identified: none  ? ?Next appointment: none  ? ?Preventive Care 3 Years and Older, Male ?Preventive care refers to lifestyle choices and visits with your health care provider that can promote health and wellness. ?What does preventive care include? ?A yearly physical exam. This is also called an annual well check. ?Dental exams once or twice a year. ?Routine eye exams. Ask your health care provider how often you should have your eyes checked. ?Personal lifestyle choices, including: ?Daily care of your teeth and gums. ?Regular physical activity. ?Eating a healthy diet. ?Avoiding tobacco and drug use. ?Limiting alcohol use. ?Practicing safe sex. ?Taking low doses of aspirin every day. ?Taking vitamin and mineral supplements as recommended by your health care provider. ?What happens during an annual well check? ?The services and screenings done by your health care provider during your annual well check will depend on your age, overall health, lifestyle risk factors, and family history of disease. ?Counseling  ?Your health care provider may ask you questions about your: ?Alcohol use. ?Tobacco use. ?Drug use. ?Emotional well-being. ?Home and relationship well-being. ?Sexual activity. ?Eating habits. ?History of falls. ?Memory and ability to understand (cognition). ?Work and work  Statistician. ?Screening  ?You may have the following tests or measurements: ?Height, weight, and BMI. ?Blood pressure. ?Lipid and cholesterol levels. These may be checked every 5 years, or more frequently if you are over 47 years old. ?Skin check. ?Lung cancer screening. You may have this screening every year starting at age 65 if you have a 30-pack-year history of smoking and currently smoke or have quit within the past 15 years. ?Fecal occult blood test (FOBT) of the stool. You may have this test every year starting at age 46. ?Flexible sigmoidoscopy or colonoscopy. You may have a sigmoidoscopy every 5 years or a colonoscopy every 10 years starting at age 72. ?Prostate cancer screening. Recommendations will vary depending on your family history and other risks. ?Hepatitis C blood test. ?Hepatitis B blood test. ?Sexually transmitted disease (STD) testing. ?Diabetes screening. This is done by checking your blood sugar (glucose) after you have not eaten for a while (fasting). You may have this done every 1-3 years. ?Abdominal aortic aneurysm (AAA) screening. You may need this if you are a current or former smoker. ?Osteoporosis. You may be screened starting at age 89 if you are at high risk. ?Talk with your health care provider about your test results, treatment options, and if necessary, the need for more tests. ?Vaccines  ?Your health care provider may recommend certain vaccines, such as: ?Influenza vaccine. This is recommended every year. ?Tetanus, diphtheria, and acellular pertussis (Tdap, Td) vaccine. You may need a Td booster every 10 years. ?Zoster vaccine. You may need this after age 39. ?Pneumococcal 13-valent conjugate (PCV13) vaccine. One dose is recommended after age 2. ?Pneumococcal polysaccharide (PPSV23) vaccine. One dose is recommended after age 103. ?Talk to your health care provider about which screenings and vaccines you need and how often you need  them. ?This information is not intended to replace  advice given to you by your health care provider. Make sure you discuss any questions you have with your health care provider. ?Document Released: 09/08/2015 Document Revised: 05/01/2016 Document Reviewed: 06/13/2015 ?Elsevier Interactive Patient Education ? 2017 [place]. ? ?Fall Prevention in the Home ?Falls can cause injuries. They can happen to people of all ages. There are many things you can do to make your home safe and to help prevent falls. ?What can I do on the outside of my home? ?Regularly fix the edges of walkways and driveways and fix any cracks. ?Remove anything that might make you trip as you walk through a door, such as a raised step or threshold. ?Trim any bushes or trees on the path to your home. ?Use bright outdoor lighting. ?Clear any walking paths of anything that might make someone trip, such as rocks or tools. ?Regularly check to see if handrails are loose or broken. Make sure that both sides of any steps have handrails. ?Any raised decks and porches should have guardrails on the edges. ?Have any leaves, snow, or ice cleared regularly. ?Use sand or salt on walking paths during winter. ?Clean up any spills in your garage right away. This includes oil or grease spills. ?What can I do in the bathroom? ?Use night lights. ?Install grab bars by the toilet and in the tub and shower. Do not use towel bars as grab bars. ?Use non-skid mats or decals in the tub or shower. ?If you need to sit down in the shower, use a plastic, non-slip stool. ?Keep the floor dry. Clean up any water that spills on the floor as soon as it happens. ?Remove soap buildup in the tub or shower regularly. ?Attach bath mats securely with double-sided non-slip rug tape. ?Do not have throw rugs and other things on the floor that can make you trip. ?What can I do in the bedroom? ?Use night lights. ?Make sure that you have a light by your bed that is easy to reach. ?Do not use any sheets or blankets that are too big for your bed.  They should not hang down onto the floor. ?Have a firm chair that has side arms. You can use this for support while you get dressed. ?Do not have throw rugs and other things on the floor that can make you trip. ?What can I do in the kitchen? ?Clean up any spills right away. ?Avoid walking on wet floors. ?Keep items that you use a lot in easy-to-reach places. ?If you need to reach something above you, use a strong step stool that has a grab bar. ?Keep electrical cords out of the way. ?Do not use floor polish or wax that makes floors slippery. If you must use wax, use non-skid floor wax. ?Do not have throw rugs and other things on the floor that can make you trip. ?What can I do with my stairs? ?Do not leave any items on the stairs. ?Make sure that there are handrails on both sides of the stairs and use them. Fix handrails that are broken or loose. Make sure that handrails are as long as the stairways. ?Check any carpeting to make sure that it is firmly attached to the stairs. Fix any carpet that is loose or worn. ?Avoid having throw rugs at the top or bottom of the stairs. If you do have throw rugs, attach them to the floor with carpet tape. ?Make sure that you have a light switch at  the top of the stairs and the bottom of the stairs. If you do not have them, ask someone to add them for you. ?What else can I do to help prevent falls? ?Wear shoes that: ?Do not have high heels. ?Have rubber bottoms. ?Are comfortable and fit you well. ?Are closed at the toe. Do not wear sandals. ?If you use a stepladder: ?Make sure that it is fully opened. Do not climb a closed stepladder. ?Make sure that both sides of the stepladder are locked into place. ?Ask someone to hold it for you, if possible. ?Clearly mark and make sure that you can see: ?Any grab bars or handrails. ?First and last steps. ?Where the edge of each step is. ?Use tools that help you move around (mobility aids) if they are needed. These  include: ?Canes. ?Walkers. ?Scooters. ?Crutches. ?Turn on the lights when you go into a dark area. Replace any light bulbs as soon as they burn out. ?Set up your furniture so you have a clear path. Avoid moving your furniture around. ?If an

## 2022-02-01 DIAGNOSIS — G4733 Obstructive sleep apnea (adult) (pediatric): Secondary | ICD-10-CM | POA: Diagnosis not present

## 2022-04-05 ENCOUNTER — Ambulatory Visit (INDEPENDENT_AMBULATORY_CARE_PROVIDER_SITE_OTHER): Payer: PPO | Admitting: Family Medicine

## 2022-04-05 ENCOUNTER — Encounter: Payer: Self-pay | Admitting: Family Medicine

## 2022-04-05 VITALS — BP 146/82 | HR 92 | Temp 97.8°F | Ht 65.0 in | Wt 190.0 lb

## 2022-04-05 DIAGNOSIS — K6289 Other specified diseases of anus and rectum: Secondary | ICD-10-CM

## 2022-04-05 NOTE — Progress Notes (Signed)
[facility] [address] [place] [address] Dept: [phone] Dept Fax: [phone]  Office Visit  Subjective:    Patient ID: Devin Martin, male    DOB: [birth year], 76 y.o..   MRN: [id number]  Chief Complaint  Patient presents with   Acute Visit    Pt c/o pain while having a bowel movements, Pain in anus x 3 weeks  Pain in RT knee     History of Present Illness:  Patient is in today for evaluation of a 3-week history of pain in his anus. [name] notes that this started after he passed a large, firm stool. He used Preparation H and had some improvement, but then the pain recurred. He scheduled this appointment to evaluate, but notes today, there has been no discomfort. He is now aware of having any hemorrhoid issues. He is up to date on his colon cancer screening (next due in 2026).  Past Medical History: Patient Active Problem List   Diagnosis Date Noted   Obesity (BMI 30.0-34.9) 10/19/2021   Prediabetes 10/19/2021   Remote history of stroke 06/03/2019   Cholecystitis 01/16/2017   Abdominal pain, epigastric 11/01/2016   S/P BKA (below knee amputation) ([place]) 05/06/2016   Osteoarthritis of right knee 12/31/2013   Hypogonadism male 08/12/2011   GERD 04/05/2010   Essential hypertension 01/26/2009   Hyperlipidemia 06/23/2007   Obstructive sleep apnea 06/23/2007   Coronary artery disease involving native coronary artery of native heart with angina pectoris ([place]) 06/23/2007   History of kidney stones 06/17/2007   Past Surgical History:  Procedure Laterality Date   AMPUT TRAUM LEG BELOW KNEE UNILT W/O COMPL Left    CARDIAC CATHETERIZATION     CYST REMOVAL TRUNK  09/04/2012   Procedure: CYST REMOVAL TRUNK;  Surgeon: Ralene Ok, MD;  Location: [facility];  Service: General;  Laterality: N/A;  EXCISION OF MID BACK MASS   EYE SURGERY Left DR.SON   CATARACT SX1/2019   INCISION AND DRAINAGE  ABSCESS Left 07/31/2013   Procedure: INCISIONAL/NON-INCISIONAL DEBRIDEMENT OF ABSCESS LEFT KNEE;  Surgeon: Mauri Pole, MD;  Location: [facility];  Service: Orthopedics;  Laterality: Left;   KIDNEY STONE SURGERY     multiple episodes    Right Knee Arthroscopy  2010   [place] [facility]   TONSILLECTOMY     Family History  Problem Relation Age of Onset   Stroke Mother    Cancer Mother        Ovary, Breast   Heart attack Father 75   Heart attack Paternal Grandfather    Colon cancer Neg Hx    Prostate cancer Neg Hx    Diabetes Neg Hx    Esophageal cancer Neg Hx    Rectal cancer Neg Hx    Stomach cancer Neg Hx    Outpatient Medications Prior to Visit  Medication Sig Dispense Refill   AMBULATORY NON FORMULARY MEDICATION Prosthetic Leg Liners 2 application 0   AMBULATORY NON FORMULARY MEDICATION ResMed CPAP Machine     Aspirin 81 MG EC tablet Take 81 mg by mouth every evening.     Cholecalciferol (D3 ADULT PO) Take 1,000 Units by mouth.      Cyanocobalamin (B-12 PO) Take 1,000 mcg by mouth.      diltiazem (TIAZAC) 240 MG 24 hr capsule Take 1 capsule (240 mg total) by mouth daily. 90 capsule 3   ibuprofen (ADVIL) 600 MG tablet Take 600 mg by mouth daily.  isosorbide mononitrate (IMDUR) 60 MG 24 hr tablet TAKE 1 AND 1/2 TABLETS(90 MG) BY MOUTH DAILY 135 tablet 3   meloxicam (MOBIC) 15 MG tablet Take 15 mg by mouth daily. Patient takes as needed     nitroGLYCERIN (NITROSTAT) 0.4 MG SL tablet PLACE 1 TABLET (0.4 MG TOTAL) UNDER THE TONGUE EVERY 5 (FIVE) MINUTES AS NEEDED FOR CHEST PAIN. 25 tablet 11   omeprazole (PRILOSEC) 20 MG capsule Take 1 capsule (20 mg total) by mouth every other day. Patient needs office visit for further refills 45 capsule 3   potassium citrate (UROCIT-K) 10 MEQ (1080 MG) SR tablet Take 2 tablets (20 mEq total) by mouth daily. 180 tablet 3   rosuvastatin (CRESTOR) 40 MG tablet TAKE 1 TABLET(40 MG) BY MOUTH DAILY 90 tablet 3   Turmeric (QC TUMERIC COMPLEX) 500 MG CAPS Take  by mouth.     Glucosamine HCl (GLUCOSAMINE PO) Take 1 tablet by mouth every evening. '1500mg'$  (Patient not taking: Reported on 01/09/2022)     No facility-administered medications prior to visit.   Allergies  Allergen Reactions   Penicillins Hives and Other (See Comments)    syncope   Tylox [Oxycodone-Acetaminophen] Other (See Comments)    "messes me up"   Codeine Nausea Only     Objective:   Today's Vitals   04/05/22 1543  BP: (!) 146/82  Pulse: 92  Temp: 97.8 F (36.6 C)  TempSrc: Temporal  SpO2: 92%  Weight: 190 lb (86.2 kg)  Height: '5\' 5"'$  (1.651 m)   Body mass index is 31.62 kg/m.   General: Well developed, well nourished. No acute distress. Rectum: The anal mucosa is intact. There are no visible external hemorrhoids. Digital rectal exam is   normal. Psych: Alert and oriented. Normal mood and affect.  Health Maintenance Due  Topic Date Due   INFLUENZA VACCINE  03/26/2022     Assessment & Plan:   1. Anal pain The pain may have been due to a anal fissure which has since healed. I recommend we observe this for now. If his pain is recurrent, we would consider using nitroglycerin ointment for management.   Return if symptoms worsen or fail to improve.   Haydee Salter, MD

## 2022-04-11 ENCOUNTER — Telehealth: Payer: Self-pay | Admitting: Family Medicine

## 2022-04-11 DIAGNOSIS — K6289 Other specified diseases of anus and rectum: Secondary | ICD-10-CM

## 2022-04-11 NOTE — Telephone Encounter (Signed)
Pt has a stye and would like an antibiotic meloxicam, It hurts when he when he has a BM. His bottom is irritated. He would like a script for some salve to relieve it.

## 2022-04-12 ENCOUNTER — Ambulatory Visit (INDEPENDENT_AMBULATORY_CARE_PROVIDER_SITE_OTHER): Payer: PPO | Admitting: Nurse Practitioner

## 2022-04-12 ENCOUNTER — Encounter: Payer: Self-pay | Admitting: Nurse Practitioner

## 2022-04-12 VITALS — BP 146/74 | HR 66 | Temp 97.1°F | Ht 65.0 in | Wt 193.2 lb

## 2022-04-12 DIAGNOSIS — H00012 Hordeolum externum right lower eyelid: Secondary | ICD-10-CM

## 2022-04-12 MED ORDER — ERYTHROMYCIN 5 MG/GM OP OINT: 1.0000 | TOPICAL_OINTMENT | Freq: Every day | OPHTHALMIC | 0 refills | Status: AC

## 2022-04-12 MED ORDER — MELOXICAM 15 MG PO TABS: 15.0000 mg | ORAL_TABLET | Freq: Every day | ORAL | 1 refills | Status: DC

## 2022-04-12 MED ORDER — NITROGLYCERIN 0.4 % RE OINT: TOPICAL_OINTMENT | RECTAL | 0 refills | Status: DC

## 2022-04-12 NOTE — Progress Notes (Signed)
   Acute Office Visit  Subjective:     Patient ID: Devin Martin, male    DOB: 1946/01/16, 76 y.o.   MRN: [id number]  Chief Complaint  Patient presents with   Stye    Stye on right eye little tender to touch symptoms x 5 days.     HPI Patient is in today for stye under his right eye for the past 5 days.  He has been using warm compresses, however it is not getting better.  He denies fevers, changes in vision, eye pain.  ROS See pertinent positives and negatives per HPI.     Objective:    BP (!) 144/72 (BP Location: Right Arm, Patient Position: Sitting, Cuff Size: Normal)   Pulse 66   Temp (!) 97.1 F (36.2 C) (Temporal)   Ht '5\' 5"'$  (1.651 m)   Wt 193 lb 3.2 oz (87.6 kg)   SpO2 96%   BMI 32.15 kg/m    Physical Exam Vitals and nursing note reviewed.  Constitutional:      Appearance: Normal appearance.  HENT:     Head: Normocephalic.  Eyes:     General:        Right eye: Hordeolum present.     Conjunctiva/sclera: Conjunctivae normal.  Pulmonary:     Effort: Pulmonary effort is normal.  Musculoskeletal:     Cervical back: Normal range of motion.  Skin:    General: Skin is warm.  Neurological:     General: No focal deficit present.     Mental Status: He is alert and oriented to person, place, and time.  Psychiatric:        Mood and Affect: Mood normal.        Behavior: Behavior normal.        Thought Content: Thought content normal.        Judgment: Judgment normal.       Assessment & Plan:   Problem List Items Addressed This Visit   None Visit Diagnoses     Hordeolum externum of right lower eyelid    -  Primary   Encouraged him to use warm compresses 4 times a day. Cna use erythromycin ointment once day. F/U if symptoms worsen or don't improve.        Meds ordered this encounter  Medications   erythromycin ophthalmic ointment    Sig: Place 1 Application into the right eye at bedtime for 7 days.    Dispense:  7 g    Refill:  0    Return if  symptoms worsen or fail to improve.  Charyl Dancer, NP

## 2022-04-12 NOTE — Telephone Encounter (Signed)
Called and spoke to pt, pt stated he spoke to PCP about being prescribed Nitroglycerin salve to help with recurrent pain after BM. Also explain to pt he will need to be examined before being prescribed an antibiotic for another issue he is having, Scheduled pt with Lauren to address acute issue. Sw, cma

## 2022-04-12 NOTE — Patient Instructions (Signed)
It was great to see you!  Start erythromycin ointment once a day on your stye. Continue using warm compresses 4 times a day for 10 minutes at a day.   Let's follow-up if your symptoms don't improve or worsen.   Take care,  Vance Peper, NP

## 2022-04-17 ENCOUNTER — Ambulatory Visit: Payer: PPO | Admitting: Family Medicine

## 2022-04-29 ENCOUNTER — Encounter: Payer: Self-pay | Admitting: Family Medicine

## 2022-05-03 ENCOUNTER — Other Ambulatory Visit: Payer: Self-pay | Admitting: Cardiology

## 2022-05-06 ENCOUNTER — Encounter: Payer: Self-pay | Admitting: Family Medicine

## 2022-05-06 DIAGNOSIS — K6289 Other specified diseases of anus and rectum: Secondary | ICD-10-CM

## 2022-05-06 MED ORDER — NIFEDIPINE 0.3 % OINTMENT: 1.0000 | TOPICAL_OINTMENT | Freq: Four times a day (QID) | CUTANEOUS | 0 refills | Status: DC

## 2022-05-08 ENCOUNTER — Telehealth: Payer: Self-pay

## 2022-05-08 DIAGNOSIS — K6289 Other specified diseases of anus and rectum: Secondary | ICD-10-CM

## 2022-05-08 MED ORDER — NIFEDIPINE 0.3 % OINTMENT: 1.0000 | TOPICAL_OINTMENT | Freq: Four times a day (QID) | CUTANEOUS | 0 refills | Status: DC

## 2022-05-08 NOTE — Addendum Note (Signed)
Addended by: Haydee Salter on: 05/08/2022 05:19 PM   Modules accepted: Orders

## 2022-05-08 NOTE — Telephone Encounter (Signed)
Received a message from [facility] that they do no carry Nifedipine Cream;suggested to send to a speciality pharmacy. Please review and advise.  Thanks.  Dm/cma

## 2022-05-09 NOTE — Telephone Encounter (Signed)
Left VM to rtn call. Dm/cma       

## 2022-05-09 NOTE — Telephone Encounter (Signed)
Patient notified VIA phone. Dm/cma  

## 2022-07-03 ENCOUNTER — Ambulatory Visit (INDEPENDENT_AMBULATORY_CARE_PROVIDER_SITE_OTHER): Payer: PPO | Admitting: Pulmonary Disease

## 2022-07-03 ENCOUNTER — Encounter (HOSPITAL_BASED_OUTPATIENT_CLINIC_OR_DEPARTMENT_OTHER): Payer: Self-pay | Admitting: Pulmonary Disease

## 2022-07-03 VITALS — BP 116/68 | HR 48 | Temp 97.8°F | Ht 65.5 in | Wt 191.6 lb

## 2022-07-03 DIAGNOSIS — G4733 Obstructive sleep apnea (adult) (pediatric): Secondary | ICD-10-CM

## 2022-07-03 NOTE — Patient Instructions (Signed)
Will have [facility] arrange for a new Resmed Auto CPAP machine with Resmed Mirage FX nasal mask.  You can look up travel CPAP machine options at CPAP.com or similar website.  Follow up in 4 months.

## 2022-07-03 NOTE — Progress Notes (Signed)
[facility]  Chief Complaint  Patient presents with   Consult    Pt states he is here to get his CPAP renewed.    Past Surgical History:  He  has a past surgical history that includes AMPUT TRAUM LEG BELOW KNEE UNILT W/O COMPL (Left); Right Knee Arthroscopy (2010); Tonsillectomy; Cyst removal trunk (09/04/2012); Cardiac catheterization; Kidney stone surgery; Incision and drainage abscess (Left, 07/31/2013); and Eye surgery (Left, DR.SON).  Past Medical History:  PNA, CAD, HTN, HLD, Glaucoma, Nephrolithiasis, OA, Anxiety  Constitutional:  BP 116/68 (BP Location: Right Arm, Patient Position: Sitting, Cuff Size: Normal)   Pulse (!) 48   Temp 97.8 F (36.6 C) (Oral)   Ht 5' 5.5" (1.664 m)   Wt 191 lb 9.6 oz (86.9 kg)   SpO2 97%   BMI 31.40 kg/m   Brief Summary:  Devin Martin is a 76 y.o. male former smoker with obstructive sleep apnea.  He had prior UPPP.      Subjective:   I last saw him in March 2020.    He uses CPAP nightly.  His current CPAP had error message saying motor life exceeded.  He uses a Sports coach FX nasal mask.  He travels a lot and would like a travel machine also.  He goes to sleep at 10 pm.  He falls asleep in 5 minutes.  He wakes up some times to use the bathroom.  He gets out of bed at 5 am.  He feels rested in the morning.  He denies morning headache.  He does not use anything to help him fall sleep or stay awake.  He denies sleep walking, sleep talking, bruxism, or nightmares.  There is no history of restless legs.  He denies sleep hallucinations, sleep paralysis, or cataplexy.  The Epworth score is 3 out of 24.   Physical Exam:   Appearance - well kempt   ENMT - no sinus tenderness, no oral exudate, no LAN, Mallampati 3 airway, no stridor, s/p UPPP  Respiratory - equal breath sounds bilaterally, no wheezing or rales  CV - s1s2 regular rate and rhythm, no murmurs  Ext - no clubbing, no  edema  Skin - no rashes  Psych - normal mood and affect   Sleep Tests:  PSG 03/31/01 >> AHI 68 Auto CPAP 11/08/20 to 12/07/20 >> used on 29 of 30 nights with average 7 hrs 40 min.  Average AHI 1.4 with median CPAP 12 and 95 th percentile CPAP 13 cm H2O  Social History:  He  reports that he has quit smoking. His smoking use included cigarettes and e-cigarettes. He has a 15.00 pack-year smoking history. He has never used smokeless tobacco. He reports current alcohol use. He reports that he does not use drugs.  Family History:  His family history includes Cancer in his mother; Heart attack in his paternal grandfather; Heart attack (age of onset: 32) in his father; Stroke in his mother.     Assessment/Plan:   Obstructive sleep apnea. - he is compliant with CPAP and reports benefit from therapy - he uses [facility] for his DME - his current machine is more than 74 yrs old - will arrange for new Resmed auto CPAP 11 - 20 cm H2O with Mirage FX nasal mask - he can look up travel CPAP machine options online - discussed pros/cons of Inspire device  Time Spent Involved in Patient Care on Day of Examination:  35 minutes  Follow up:  Patient Instructions  Will have [facility] arrange for a new Resmed Auto CPAP machine with Resmed Mirage FX nasal mask.  You can look up travel CPAP machine options at CPAP.com or similar website.  Follow up in 4 months.  Medication List:   Allergies as of 07/03/2022       Reactions   Penicillins Hives, Other (See Comments)   syncope   Tylox [oxycodone-acetaminophen] Other (See Comments)   "messes me up"   Codeine Nausea Only        Medication List        Accurate as of July 03, 2022 10:28 AM. If you have any questions, ask your nurse or doctor.          STOP taking these medications    GLUCOSAMINE PO Stopped by: Chesley Mires, MD       TAKE these medications    AMBULATORY NON FORMULARY MEDICATION ResMed CPAP Machine   AMBULATORY NON  FORMULARY MEDICATION Prosthetic Leg Liners   Aspirin 81 MG EC tablet Take 81 mg by mouth every evening.   B-12 PO Take 1,000 mcg by mouth.   D3 ADULT PO Take 1,000 Units by mouth.   diltiazem 240 MG 24 hr capsule Commonly known as: TIAZAC Take 1 capsule (240 mg total) by mouth daily.   ibuprofen 600 MG tablet Commonly known as: ADVIL Take 600 mg by mouth daily.   isosorbide mononitrate 60 MG 24 hr tablet Commonly known as: IMDUR TAKE 1 AND 1/2 TABLETS(90 MG) BY MOUTH DAILY   meloxicam 15 MG tablet Commonly known as: MOBIC Take 1 tablet (15 mg total) by mouth daily. Patient takes as needed   nifedipine 0.3 % ointment Place 1 Application rectally 4 (four) times daily.   Nitroglycerin 0.4 % Oint Apply 1 inch (375 mg) ointment intra-anally every 12 hours for anal fissure   nitroGLYCERIN 0.4 MG SL tablet Commonly known as: Nitrostat PLACE 1 TABLET (0.4 MG TOTAL) UNDER THE TONGUE EVERY 5 (FIVE) MINUTES AS NEEDED FOR CHEST PAIN.   omeprazole 20 MG capsule Commonly known as: PRILOSEC TAKE 1 CAPSULE(20 MG) BY MOUTH EVERY OTHER DAY   potassium citrate 10 MEQ (1080 MG) SR tablet Commonly known as: UROCIT-K Take 2 tablets (20 mEq total) by mouth daily.   QC Tumeric Complex 500 MG Caps Generic drug: Turmeric Take by mouth.   rosuvastatin 40 MG tablet Commonly known as: CRESTOR TAKE 1 TABLET(40 MG) BY MOUTH DAILY        Signature:  Chesley Mires, MD [place] Pager - [phone] 07/03/2022, 10:28 AM

## 2022-07-08 DIAGNOSIS — G4733 Obstructive sleep apnea (adult) (pediatric): Secondary | ICD-10-CM | POA: Diagnosis not present

## 2022-07-10 ENCOUNTER — Encounter: Payer: Self-pay | Admitting: Family Medicine

## 2022-07-15 ENCOUNTER — Ambulatory Visit (INDEPENDENT_AMBULATORY_CARE_PROVIDER_SITE_OTHER): Payer: PPO | Admitting: Family Medicine

## 2022-07-15 ENCOUNTER — Encounter: Payer: Self-pay | Admitting: Family Medicine

## 2022-07-15 VITALS — BP 150/80 | HR 61 | Temp 97.6°F | Ht 65.5 in | Wt 192.0 lb

## 2022-07-15 DIAGNOSIS — I25119 Atherosclerotic heart disease of native coronary artery with unspecified angina pectoris: Secondary | ICD-10-CM | POA: Diagnosis not present

## 2022-07-15 DIAGNOSIS — E782 Mixed hyperlipidemia: Secondary | ICD-10-CM

## 2022-07-15 DIAGNOSIS — K6289 Other specified diseases of anus and rectum: Secondary | ICD-10-CM | POA: Diagnosis not present

## 2022-07-15 DIAGNOSIS — I10 Essential (primary) hypertension: Secondary | ICD-10-CM

## 2022-07-15 NOTE — Progress Notes (Signed)
[facility] [address] [place] [address] Dept: [phone] Dept Fax: [phone]  Chronic Care Office Visit  Subjective:    Patient ID: Devin Martin, male    DOB: [birth year], 76 y.o..   MRN: [id number]  Chief Complaint  Patient presents with   Follow-up    C/o having a hemorrhoid and  painful BM's. Constipation with traveling. Has been using cream with little relief.     History of Present Illness:  Patient is in today for reassessment of chronic medical issues.  Mr. Palmatier has a history of a prior MI with stent placement. He is managed by Dr. [name] (cardiology). He also has hypertension and hyperlipidemia. He is currently on aspirin 81 mg daily, diltiazem 240 mg daily, isosorbide 60 mg 1 1/2 tabs daily, and rosuvastatin 40 mg daily. He denies any chest pain. He is not tracking his blood pressure at home.   Mr. Penick continues to have issues with rectal pain. This started in late July. We had tried a trial of nitroglycerin ointment. He found this helped int he first few weeks, but no longer. He feels he may have a swollen area present.  Past Medical History: Patient Active Problem List   Diagnosis Date Noted   Obesity (BMI 30.0-34.9) 10/19/2021   Prediabetes 10/19/2021   Remote history of stroke 06/03/2019   Cholecystitis 01/16/2017   S/P BKA (below knee amputation) ([place]) 05/06/2016   Osteoarthritis of right knee 12/31/2013   Hypogonadism male 08/12/2011   GERD 04/05/2010   Essential hypertension 01/26/2009   Hyperlipidemia 06/23/2007   Obstructive sleep apnea 06/23/2007   Coronary artery disease involving native coronary artery of native heart with angina pectoris ([place]) 06/23/2007   History of kidney stones 06/17/2007   Past Surgical History:  Procedure Laterality Date   AMPUT TRAUM LEG BELOW KNEE UNILT W/O COMPL Left    CARDIAC CATHETERIZATION     CYST REMOVAL TRUNK  09/04/2012   Procedure: CYST  REMOVAL TRUNK;  Surgeon: Ralene Ok, MD;  Location: [facility];  Service: General;  Laterality: N/A;  EXCISION OF MID BACK MASS   EYE SURGERY Left DR.SON   CATARACT SX1/2019   INCISION AND DRAINAGE ABSCESS Left 07/31/2013   Procedure: INCISIONAL/NON-INCISIONAL DEBRIDEMENT OF ABSCESS LEFT KNEE;  Surgeon: Mauri Pole, MD;  Location[facility];  Service: Orthopedics;  Laterality: Left;   KIDNEY STONE SURGERY     multiple episodes    Right Knee Arthroscopy  2010   [place]   TONSILLECTOMY     Family History  Problem Relation Age of Onset   Stroke Mother    Cancer Mother        Ovary, Breast   Heart attack Father 76   Heart attack Paternal Grandfather    Colon cancer Neg Hx    Prostate cancer Neg Hx    Diabetes Neg Hx    Esophageal cancer Neg Hx    Rectal cancer Neg Hx    Stomach cancer Neg Hx    Outpatient Medications Prior to Visit  Medication Sig Dispense Refill   AMBULATORY NON FORMULARY MEDICATION Prosthetic Leg Liners 2 application 0   AMBULATORY NON FORMULARY MEDICATION ResMed CPAP Machine     Aspirin 81 MG EC tablet Take 81 mg by mouth every evening.     Cholecalciferol (D3 ADULT PO) Take 1,000 Units by mouth.      Cyanocobalamin (B-12 PO) Take 1,000 mcg by mouth.  diltiazem (TIAZAC) 240 MG 24 hr capsule Take 1 capsule (240 mg total) by mouth daily. 90 capsule 3   ibuprofen (ADVIL) 600 MG tablet Take 600 mg by mouth daily.     isosorbide mononitrate (IMDUR) 60 MG 24 hr tablet TAKE 1 AND 1/2 TABLETS(90 MG) BY MOUTH DAILY 135 tablet 3   meloxicam (MOBIC) 15 MG tablet Take 1 tablet (15 mg total) by mouth daily. Patient takes as needed 30 tablet 1   nifedipine 0.3 % ointment Place 1 Application rectally 4 (four) times daily. 30 g 0   nitroGLYCERIN (NITROSTAT) 0.4 MG SL tablet PLACE 1 TABLET (0.4 MG TOTAL) UNDER THE TONGUE EVERY 5 (FIVE) MINUTES AS NEEDED FOR CHEST PAIN. 25 tablet 11   Nitroglycerin 0.4 % OINT Apply 1 inch (375 mg) ointment intra-anally  every 12 hours for anal fissure 30 g 0   omeprazole (PRILOSEC) 20 MG capsule TAKE 1 CAPSULE(20 MG) BY MOUTH EVERY OTHER DAY 45 capsule 0   potassium citrate (UROCIT-K) 10 MEQ (1080 MG) SR tablet Take 2 tablets (20 mEq total) by mouth daily. 180 tablet 3   rosuvastatin (CRESTOR) 40 MG tablet TAKE 1 TABLET(40 MG) BY MOUTH DAILY 90 tablet 3   Turmeric (QC TUMERIC COMPLEX) 500 MG CAPS Take by mouth.     No facility-administered medications prior to visit.   Allergies  Allergen Reactions   Penicillins Hives and Other (See Comments)    syncope   Tylox [Oxycodone-Acetaminophen] Other (See Comments)    "messes me up"   Codeine Nausea Only     Objective:   Today's Vitals   07/15/22 0906  BP: (!) 144/76  Pulse: 61  Temp: 97.6 F (36.4 C)  TempSrc: Temporal  SpO2: 95%  Weight: 192 lb (87.1 kg)  Height: 5' 5.5" (1.664 m)   Body mass index is 31.46 kg/m.   General: Well developed, well nourished. No acute distress. Psych: Alert and oriented. Normal mood and affect.  There are no preventive care reminders to display for this patient.    Assessment & Plan:   1. Essential hypertension Blood pressure is elevated today. It was very normal at his last visit earlier this month. I recommend that Mr. Ferrick obtain a home blood pressure monitor and start tracking his pressures. For now we will continue his diltiazem 240 mg daily.  2. Coronary artery disease involving native coronary artery of native heart with angina pectoris ([place]) Stable. Continue aspirin 81 mg daily and isosorbide 60 mg 1 1/2 tabs daily.  3. Mixed hyperlipidemia Continue rosuvastatin 40 mg. Plan to recheck fasting lipids at his next visit.  4. 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 I will refer him to GI for evaluation to rule out other potential causes and to suggest other possible approaches to relieving this.  - Ambulatory referral to Gastroenterology   Return in about 3  months (around 10/15/2022) for Reassessment; fasting labs.   Haydee Salter, MD

## 2022-07-19 ENCOUNTER — Encounter: Payer: Self-pay | Admitting: Cardiology

## 2022-07-19 DIAGNOSIS — I10 Essential (primary) hypertension: Secondary | ICD-10-CM

## 2022-07-22 ENCOUNTER — Encounter: Payer: Self-pay | Admitting: Cardiology

## 2022-07-22 ENCOUNTER — Encounter: Payer: Self-pay | Admitting: Family Medicine

## 2022-07-22 ENCOUNTER — Encounter: Payer: Self-pay | Admitting: Internal Medicine

## 2022-07-22 DIAGNOSIS — I10 Essential (primary) hypertension: Secondary | ICD-10-CM

## 2022-07-22 MED ORDER — IRBESARTAN 150 MG PO TABS: 150.0000 mg | ORAL_TABLET | Freq: Every day | ORAL | 3 refills | Status: DC

## 2022-07-22 MED ORDER — LISINOPRIL 5 MG PO TABS: 5.0000 mg | ORAL_TABLET | Freq: Every day | ORAL | 3 refills | Status: DC

## 2022-07-23 ENCOUNTER — Ambulatory Visit: Payer: PPO | Admitting: Family Medicine

## 2022-07-30 ENCOUNTER — Ambulatory Visit (INDEPENDENT_AMBULATORY_CARE_PROVIDER_SITE_OTHER): Payer: PPO | Admitting: Family Medicine

## 2022-07-30 ENCOUNTER — Encounter: Payer: Self-pay | Admitting: Family Medicine

## 2022-07-30 VITALS — BP 138/76 | HR 63 | Temp 97.6°F | Ht 65.5 in | Wt 190.6 lb

## 2022-07-30 DIAGNOSIS — K6289 Other specified diseases of anus and rectum: Secondary | ICD-10-CM

## 2022-07-30 DIAGNOSIS — I10 Essential (primary) hypertension: Secondary | ICD-10-CM | POA: Diagnosis not present

## 2022-07-30 MED ORDER — IRBESARTAN 150 MG PO TABS: 75.0000 mg | ORAL_TABLET | Freq: Every day | ORAL | 3 refills | Status: DC

## 2022-07-30 MED ORDER — NITROGLYCERIN 0.4 % RE OINT: TOPICAL_OINTMENT | RECTAL | 0 refills | Status: DC

## 2022-07-30 NOTE — Progress Notes (Signed)
[facility] [address] [place] [address] Dept: [phone] Dept Fax: [phone]  Office Visit  Subjective:    Patient ID: Devin Martin, male    DOB: 1945/10/28, 76 y.o..   MRN: [id number]  Chief Complaint  Patient presents with   Follow-up    F/u BP.  Average BP  126/65, 149/71, 177/80, 166/100 before new med, 140/77, 155/84, 129/63 after new med    History of Present Illness:  Patient is in today for reassessment of his blood pressure. He had been managed on diltiazem 240 mg daily. At his last visit, his blood pressure was elevated at 144/76. I asked him to start tracking his blood pressures at home. He sent me a record of his pressures, which showed a 7-day average for the morning of 135/65 and in the evening of 161/86. At the same time, he had been in touch with Dr. [name] (cardiology). Dr. [name] had him start irbesartan 150 mg daily. Since starting the new medicine, Devin Martin reports feeling very wiped out with low energy. He has continue to follow his pressures.   Additionally, Devin Martin has continued to have issues with rectal pain. This started in late July. We have been managing this with nitroglycerin ointment. He has had some gradual improvement. I referred him to GI, but he notes he won't be able to get in until Feb.  Past Medical History: Patient Active Problem List   Diagnosis Date Noted   Obesity (BMI 30.0-34.9) 10/19/2021   Prediabetes 10/19/2021   Remote history of stroke 06/03/2019   Cholecystitis 01/16/2017   S/P BKA (below knee amputation) ([place]) 05/06/2016   Osteoarthritis of right knee 12/31/2013   Hypogonadism male 08/12/2011   GERD 04/05/2010   Essential hypertension 01/26/2009   Hyperlipidemia 06/23/2007   Obstructive sleep apnea 06/23/2007   Coronary artery disease involving native coronary artery of native heart with angina pectoris ([place]) 06/23/2007   History of kidney stones  06/17/2007   Past Surgical History:  Procedure Laterality Date   AMPUT TRAUM LEG BELOW KNEE UNILT W/O COMPL Left    CARDIAC CATHETERIZATION     CYST REMOVAL TRUNK  09/04/2012   Procedure: CYST REMOVAL TRUNK;  Surgeon: Ralene Ok, MD;  Location: [facility];  Service: General;  Laterality: N/A;  EXCISION OF MID BACK MASS   EYE SURGERY Left DR.SON   CATARACT SX1/2019   INCISION AND DRAINAGE ABSCESS Left 07/31/2013   Procedure: INCISIONAL/NON-INCISIONAL DEBRIDEMENT OF ABSCESS LEFT KNEE;  Surgeon: Mauri Pole, MD;  Location: [facility];  Service: Orthopedics;  Laterality: Left;   KIDNEY STONE SURGERY     multiple episodes    Right Knee Arthroscopy  2010   [facility]   TONSILLECTOMY     Family History  Problem Relation Age of Onset   Stroke Mother    Cancer Mother        Ovary, Breast   Heart attack Father 18   Heart attack Paternal Grandfather    Colon cancer Neg Hx    Prostate cancer Neg Hx    Diabetes Neg Hx    Esophageal cancer Neg Hx    Rectal cancer Neg Hx    Stomach cancer Neg Hx    Outpatient Medications Prior to Visit  Medication Sig Dispense Refill   AMBULATORY NON FORMULARY MEDICATION Prosthetic Leg Liners 2 application 0   AMBULATORY NON FORMULARY MEDICATION ResMed CPAP Machine     Aspirin 81 MG EC tablet Take  81 mg by mouth every evening.     Cholecalciferol (D3 ADULT PO) Take 1,000 Units by mouth.      Cyanocobalamin (B-12 PO) Take 1,000 mcg by mouth.      diltiazem (TIAZAC) 240 MG 24 hr capsule Take 1 capsule (240 mg total) by mouth daily. 90 capsule 3   ibuprofen (ADVIL) 600 MG tablet Take 600 mg by mouth daily.     isosorbide mononitrate (IMDUR) 60 MG 24 hr tablet TAKE 1 AND 1/2 TABLETS(90 MG) BY MOUTH DAILY 135 tablet 3   meloxicam (MOBIC) 15 MG tablet Take 1 tablet (15 mg total) by mouth daily. Patient takes as needed 30 tablet 1   nitroGLYCERIN (NITROSTAT) 0.4 MG SL tablet PLACE 1 TABLET (0.4 MG TOTAL) UNDER THE TONGUE EVERY 5 (FIVE) MINUTES AS  NEEDED FOR CHEST PAIN. 25 tablet 11   omeprazole (PRILOSEC) 20 MG capsule TAKE 1 CAPSULE(20 MG) BY MOUTH EVERY OTHER DAY 45 capsule 0   potassium citrate (UROCIT-K) 10 MEQ (1080 MG) SR tablet Take 2 tablets (20 mEq total) by mouth daily. 180 tablet 3   rosuvastatin (CRESTOR) 40 MG tablet TAKE 1 TABLET(40 MG) BY MOUTH DAILY 90 tablet 3   Turmeric (QC TUMERIC COMPLEX) 500 MG CAPS Take by mouth.     irbesartan (AVAPRO) 150 MG tablet Take 1 tablet (150 mg total) by mouth daily. 90 tablet 3   nifedipine 0.3 % ointment Place 1 Application rectally 4 (four) times daily. 30 g 0   Nitroglycerin 0.4 % OINT Apply 1 inch (375 mg) ointment intra-anally every 12 hours for anal fissure 30 g 0   No facility-administered medications prior to visit.   Allergies  Allergen Reactions   Penicillins Hives and Other (See Comments)    syncope   Tylox [Oxycodone-Acetaminophen] Other (See Comments)    "messes me up"   Codeine Nausea Only   Objective:   Today's Vitals   07/30/22 1335  BP: 138/76  Pulse: 63  Temp: 97.6 F (36.4 C)  TempSrc: Temporal  SpO2: 97%  Weight: 190 lb 9.6 oz (86.5 kg)  Height: 5' 5.5" (1.664 m)   Body mass index is 31.24 kg/m.   General: Well developed, well nourished. No acute distress. Psych: Alert and oriented. Normal mood and affect.  There are no preventive care reminders to display for this patient.     Assessment & Plan:   1. Essential hypertension Since the addition of irbesartan, Devin Martin blood pressure is now 137/69 in the morning and 143/82 in the evenings. Because of his feeling a loss of energy, I will have him reduce down to 75 mg daily. He should continue to monitor his BP at home. We may try to push the dose back up in a month, after he has gotten used to the new medicine. I will check a BMP today to make sure his renal function remains normal.  - Basic metabolic panel - irbesartan (AVAPRO) 150 MG tablet; Take 0.5 tablets (75 mg total) by mouth daily.   Dispense: 90 tablet; Refill: 3  2. Anal pain We will continue the use of the NTG ointment, pending seeing GI in Feb.  - Nitroglycerin 0.4 % OINT; Apply 1 inch (375 mg) ointment intra-anally every 12 hours for anal fissure  Dispense: 30 g; Refill: 0   Return in about 4 weeks (around 08/27/2022) for Reassessment.   Haydee Salter, MD

## 2022-07-31 ENCOUNTER — Ambulatory Visit: Payer: PPO | Admitting: Family Medicine

## 2022-07-31 LAB — BASIC METABOLIC PANEL
BUN: 16 mg/dL (ref 6–23)
CO2: 29 mEq/L (ref 19–32)
Calcium: 9.2 mg/dL (ref 8.4–10.5)
Chloride: 104 mEq/L (ref 96–112)
Creatinine, Ser: 0.88 mg/dL (ref 0.40–1.50)
GFR: 84.59 mL/min (ref >60.00)
Glucose, Bld: 89 mg/dL (ref 70–99)
Potassium: 4.3 mEq/L (ref 3.5–5.1)
Sodium: 140 mEq/L (ref 135–145)

## 2022-08-01 ENCOUNTER — Other Ambulatory Visit: Payer: Self-pay | Admitting: Cardiology

## 2022-08-02 ENCOUNTER — Telehealth: Payer: Self-pay

## 2022-08-02 ENCOUNTER — Telehealth: Payer: Self-pay | Admitting: Cardiology

## 2022-08-02 NOTE — Telephone Encounter (Signed)
Patient walked in for repeat BMET- upon chart review patient had lab work done 3 days ago at PCP (available in chart)- advised patient not to have another BMET drawn and that I would let Dr. [name] know that results are available in the chart to be reviewed. Patient verbalized understanding.

## 2022-08-02 NOTE — Telephone Encounter (Signed)
Tried to call pt VM is full. Will have to call again later

## 2022-08-02 NOTE — Telephone Encounter (Signed)
Patient received order for lab work and he would like to know if he should have labs drawn now or closer to his appointment on 09/30/22.

## 2022-08-05 NOTE — Telephone Encounter (Signed)
Returned call to patient and made him aware that results are normal.   Patient reports that he has had issues with elevated blood pressures in the 403'J systolic and states that this was a sudden change from his normal BP around 130's. Patient reports he started taking Irbesartan '75mg'$  and BP has been trending back down to the 130's-140's.   Patient states that he is concerned about sudden need for increased BP medication. Patient states that he feels like he may be overreacting and states that he is also concerned about being on so many medications so he didn't like going on another to begin with. Patient checked BP after 10 minutes being on the phone and discussing BP concerns and reports is 185/90- advised patient to relax and re check BP in a little while. Advised patient on how anxiousness/stress can increase BP. Patient is aware and will relax and check BP a little later and call with any concerns. Advised patient I would forward to MD. Patient verbalized understanding.

## 2022-08-07 ENCOUNTER — Ambulatory Visit: Payer: PPO | Admitting: Family Medicine

## 2022-08-07 ENCOUNTER — Encounter: Payer: Self-pay | Admitting: Family Medicine

## 2022-08-07 DIAGNOSIS — G4733 Obstructive sleep apnea (adult) (pediatric): Secondary | ICD-10-CM | POA: Diagnosis not present

## 2022-08-07 NOTE — Telephone Encounter (Signed)
See 08/02/22 telephone note

## 2022-08-17 ENCOUNTER — Encounter: Payer: Self-pay | Admitting: Family Medicine

## 2022-08-21 ENCOUNTER — Encounter: Payer: Self-pay | Admitting: Family Medicine

## 2022-08-22 ENCOUNTER — Telehealth: Payer: Self-pay | Admitting: Pulmonary Disease

## 2022-08-23 NOTE — Telephone Encounter (Signed)
Called Pt and left a detailed message letting him know that we don't take used CPAPs and that he could maybe reach out to a home health medical store and see if they could help him. Nothing further need at this time.

## 2022-08-26 ENCOUNTER — Encounter: Payer: Self-pay | Admitting: Cardiology

## 2022-08-26 DIAGNOSIS — G4733 Obstructive sleep apnea (adult) (pediatric): Secondary | ICD-10-CM | POA: Diagnosis not present

## 2022-08-27 ENCOUNTER — Telehealth: Payer: Self-pay | Admitting: Emergency Medicine

## 2022-08-27 NOTE — Telephone Encounter (Signed)
Called patient in regards to his MyChart message stating that he has been experiencing some chest pressure/tightening and took a Nitroglycerin yesterday 08/26/22. Patient states that over the last few weeks, when he does his exercise on the eliptical, walks up-hill, or does anything strenuous that he has pressure/tightening in chest. The pressure was intense yesterday 08/26/22 and took a nitroglycerin and pressure immediately went away. Pt denies any pain, no SOB, no sweating, no N/V, no back pain, no dizziness and no light-headedness. Also reports that his BP has been spiking lately in the evenings- 150-160's. I asked him to send his BP reading to [place]. Pt states that he will send BP log through MyChart in a few hours- it is at home. Pt is worried- he feels like something is different/"off" and he is supposed to go out of town this Friday- hunting, fishing, shooting and is not sure whether he should go; he wants to get the provider's recommendation. Went over NTG education, told patient to let us know if this happens again, and any changes. Told to continue monitoring BP and s/s. Pt knows to call 911 if he takes 3 doses of NTG and s/s continue. Pt is scheduled for a f/u visit 09/30/22. Told that this information will be sent to the provider and we will get back in touch with him.

## 2022-08-27 NOTE — Telephone Encounter (Signed)
Spoke with pt, Follow up scheduled tomorrow.

## 2022-08-28 ENCOUNTER — Ambulatory Visit: Payer: PPO | Attending: Nurse Practitioner | Admitting: Nurse Practitioner

## 2022-08-28 ENCOUNTER — Telehealth: Payer: Self-pay | Admitting: Family Medicine

## 2022-08-28 ENCOUNTER — Encounter: Payer: Self-pay | Admitting: Nurse Practitioner

## 2022-08-28 VITALS — BP 112/80 | HR 72 | Ht 65.5 in | Wt 190.0 lb

## 2022-08-28 DIAGNOSIS — I10 Essential (primary) hypertension: Secondary | ICD-10-CM | POA: Diagnosis not present

## 2022-08-28 DIAGNOSIS — E785 Hyperlipidemia, unspecified: Secondary | ICD-10-CM

## 2022-08-28 DIAGNOSIS — R079 Chest pain, unspecified: Secondary | ICD-10-CM | POA: Diagnosis not present

## 2022-08-28 DIAGNOSIS — I25118 Atherosclerotic heart disease of native coronary artery with other forms of angina pectoris: Secondary | ICD-10-CM | POA: Diagnosis not present

## 2022-08-28 LAB — CBC

## 2022-08-28 MED ORDER — METOPROLOL TARTRATE 25 MG PO TABS: 12.5000 mg | ORAL_TABLET | Freq: Two times a day (BID) | ORAL | 3 refills | Status: DC

## 2022-08-28 MED ORDER — SODIUM CHLORIDE 0.9% FLUSH: 3.0000 mL | Freq: Two times a day (BID) | INTRAVENOUS | Status: DC

## 2022-08-28 NOTE — Telephone Encounter (Signed)
Called number back and spoke to his wife, advised that we dont' need to she him tomorrow and canceled the appointment. Dm/cma

## 2022-08-28 NOTE — H&P (View-Only) (Signed)
Office Visit    Patient Name: Devin Martin Date of Encounter: 08/28/2022  Primary Care Provider:  Haydee Salter, MD Primary Cardiologist:  Kirk Ruths, MD  Chief Complaint    77 year old male with a history of CAD s/p DES-M LAD in 2005, coronary vasospasm, hypertension, and hyperlipidemia who presents for follow-up related to CAD.  Past Medical History    Past Medical History:  Diagnosis Date   ANXIETY 10/13/2009   Qualifier: Diagnosis of  By: [name], MD, FACC, [place]   pt wears a lt bk prosthesis   Bronchitis, chronic ([place])    CAD (coronary artery disease)    Non-ST segment elevation myocardial infarction in 2005 with drug-eluting stent to the left anterior descending. 08-2009 - non-ST segment elevation myocardial infarction, felt secondary to vasospasm.   Calculus, kidney    hx   Complication of anesthesia    itching after preop med  ?name - 2013, no SOB   Glaucoma    Hyperlipidemia    Hypertension    Myocardial infarction ([place]) 2005   x2, 2005, 2010   OSA (obstructive sleep apnea)    AHI 68/hr in 2002-uses a cpap   Pneumonia    hx of    Sleep apnea    wears CPAP   Wears glasses    Past Surgical History:  Procedure Laterality Date   AMPUT TRAUM LEG BELOW KNEE UNILT W/O COMPL Left    CARDIAC CATHETERIZATION     CYST REMOVAL TRUNK  09/04/2012   Procedure: CYST REMOVAL TRUNK;  Surgeon: Ralene Ok, MD;  Location: [facility];  Service: General;  Laterality: N/A;  EXCISION OF MID BACK MASS   EYE SURGERY Left DR.SON   CATARACT SX1/2019   INCISION AND DRAINAGE ABSCESS Left 07/31/2013   Procedure: INCISIONAL/NON-INCISIONAL DEBRIDEMENT OF ABSCESS LEFT KNEE;  Surgeon: Mauri Pole, MD;  Location: [facility];  Service: Orthopedics;  Laterality: Left;   KIDNEY STONE SURGERY     multiple episodes    Right Knee Arthroscopy  2010   [facility]   TONSILLECTOMY      Allergies  Allergies  Allergen Reactions   Penicillins  Hives and Other (See Comments)    syncope   Tylox [Oxycodone-Acetaminophen] Other (See Comments)    "messes me up"   Codeine Nausea Only    History of Present Illness    77 year old male with the above past medical history including CAD s/p DES-mLAD in 2005, coronary vasospasm, hypertension, and hyperlipidemia.  He was hospitalized in December 2005 in the setting of NSTEMI s/p DES-mLAD.  EF was 60% at the time.  There was notable jailing of 2 small diagonals by the LAD stent.  Repeat cardiac catheterization in January 2011 in the setting of MI revealed patent stent to LAD, jailing of the 2 diagonal branches with 90% ostial stenoses, 25 to 30% LCx, and 25 to 30% RCA lesion. There was intense vasospasm and it was felt that his infarct was likely secondary to vasospasm.  Abdominal ultrasound in 2017 showed no evidence of aneurysm. Nuclear study in 05/2016 showed EF 52%, septal infarct and mild apical ischemia, treated medically.  He was last seen in the office on 05/18/2021 and was stable overall from a cardiac standpoint.  He denied symptoms concerning for angina.  He contacted our office in December 2023 with concern for elevated BP readings.  Ongoing monitoring was advised.  He contacted our office again on 08/26/2022 with complaints of  increased chest tightness/pressure with exertion, relieved with nitroglycerin.  He presents today for follow-up.  Since his last visit he has been stable overall from a cardiac standpoint though he does note a 1 week history of progressive chest tightness/pressure with exertion, similar to prior anginal equivalent. He is concerned that he "may have another blockage."  Additionally, he notes that over the past month he has had elevated blood pressure readings in the evenings.  However, when compared to a manual BP reading in the office today, his home BP cuff shows significantly higher reading.  Other than his recent elevated home BP readings and progressive chest  tightness/pressure, he denies any additional concerns today.  Home Medications    Current Outpatient Medications  Medication Sig Dispense Refill   AMBULATORY NON FORMULARY MEDICATION Prosthetic Leg Liners 2 application 0   AMBULATORY NON FORMULARY MEDICATION ResMed CPAP Machine     Aspirin 81 MG EC tablet Take 81 mg by mouth every evening.     Cholecalciferol (D3 ADULT PO) Take 1,000 Units by mouth.      Cyanocobalamin (B-12 PO) Take 1,000 mcg by mouth.      diltiazem (TIAZAC) 240 MG 24 hr capsule Take 1 capsule (240 mg total) by mouth daily. 90 capsule 3   ibuprofen (ADVIL) 600 MG tablet Take 600 mg by mouth daily.     irbesartan (AVAPRO) 150 MG tablet Take 0.5 tablets (75 mg total) by mouth daily. 90 tablet 3   isosorbide mononitrate (IMDUR) 60 MG 24 hr tablet TAKE 1 AND 1/2 TABLETS(90 MG) BY MOUTH DAILY 135 tablet 3   meloxicam (MOBIC) 15 MG tablet Take 1 tablet (15 mg total) by mouth daily. Patient takes as needed 30 tablet 1   metoprolol tartrate (LOPRESSOR) 25 MG tablet Take 0.5 tablets (12.5 mg total) by mouth 2 (two) times daily. 90 tablet 3   nitroGLYCERIN (NITROSTAT) 0.4 MG SL tablet PLACE 1 TABLET (0.4 MG TOTAL) UNDER THE TONGUE EVERY 5 (FIVE) MINUTES AS NEEDED FOR CHEST PAIN. 25 tablet 11   Nitroglycerin 0.4 % OINT Apply 1 inch (375 mg) ointment intra-anally every 12 hours for anal fissure 30 g 0   omeprazole (PRILOSEC) 20 MG capsule TAKE 1 CAPSULE(20 MG) BY MOUTH EVERY OTHER DAY 45 capsule 5   potassium citrate (UROCIT-K) 10 MEQ (1080 MG) SR tablet Take 2 tablets (20 mEq total) by mouth daily. 180 tablet 3   rosuvastatin (CRESTOR) 40 MG tablet TAKE 1 TABLET(40 MG) BY MOUTH DAILY 90 tablet 3   Turmeric (QC TUMERIC COMPLEX) 500 MG CAPS Take by mouth.     Current Facility-Administered Medications  Medication Dose Route Frequency Provider Last Rate Last Admin   sodium chloride flush (NS) 0.9 % injection 3 mL  3 mL Intravenous Q12H Brandye Inthavong C, NP         Review of Systems     He denies palpitations, dyspnea, pnd, orthopnea, n, v, dizziness, syncope, edema, weight gain, or early satiety. All other systems reviewed and are otherwise negative except as noted above.   Physical Exam    VS:  BP 112/80 (BP Location: Left Arm, Patient Position: Sitting, Cuff Size: Normal)   Pulse 72   Ht 5' 5.5" (1.664 m)   Wt 190 lb (86.2 kg)   BMI 31.14 kg/m  GEN: Well nourished, well developed, in no acute distress. HEENT: normal. Neck: Supple, no JVD, carotid bruits, or masses. Cardiac: RRR, no murmurs, rubs, or gallops. No clubbing, cyanosis, edema.  Radials/DP/PT 2+ and  equal bilaterally.  Respiratory:  Respirations regular and unlabored, clear to auscultation bilaterally. GI: Soft, nontender, nondistended, BS + x 4. MS: no deformity or atrophy. Skin: warm and dry, no rash. Neuro:  Strength and sensation are intact. Psych: Normal affect.  Accessory Clinical Findings    ECG personally reviewed by me today -NSR, 72 bpm- no acute changes.   Lab Results  Component Value Date   WBC 5.7 04/16/2021   HGB 14.4 04/16/2021   HCT 43.0 04/16/2021   MCV 94.3 04/16/2021   PLT 232.0 04/16/2021   Lab Results  Component Value Date   CREATININE 0.88 07/30/2022   BUN 16 07/30/2022   NA 140 07/30/2022   K 4.3 07/30/2022   CL 104 07/30/2022   CO2 29 07/30/2022   Lab Results  Component Value Date   ALT 18 04/16/2021   AST 16 04/16/2021   ALKPHOS 66 04/16/2021   BILITOT 1.4 (H) 04/16/2021   Lab Results  Component Value Date   CHOL 115 10/19/2021   HDL 62.40 10/19/2021   LDLCALC 40 10/19/2021   LDLDIRECT 150.1 06/24/2007   TRIG 62.0 10/19/2021   CHOLHDL 2 10/19/2021    Lab Results  Component Value Date   HGBA1C 5.8 10/19/2021    Assessment & Plan    1. CAD/exertional chest pain: S/p DES-mLAD with notable jailing of 2 small diagonals by the LAD stent. Cath in January 2011 in the setting of MI revealed patent stent to LAD, jailing of the 2 diagonal branches with 90%  ostial stenoses, 25 to 30% LCx, and 25 to 30% RCA lesion. There was intense vasospasm and it was felt that his infarct was likely secondary to vasospasm. Nuclear study in 05/2016 showed EF 52%, septal infarct and mild apical ischemia, treated medically.  He notes a 1 week history of progressive exertional chest tightness/pressure relieved with nitroglycerin, similar to prior angina equivalent. Discussed with Dr. Sallyanne Kuster, DOD, given history and symptoms will proceed with LHC. This has been scheduled for 08/30/2021 at 9 am with Dr. Ali Lowe. Will start metoprolol tartrate 12.5 mg bid. Will check CBC, BMET today.  Continue aspirin, irbesartan, Imdur, Crestor.  Shared Decision Making/Informed Consent The risks [stroke (1 in 1000), death (1 in 1000), kidney failure [usually temporary] (1 in 500), bleeding (1 in 200), allergic reaction [possibly serious] (1 in 200)], benefits (diagnostic support and management of coronary artery disease) and alternatives of a cardiac catheterization were discussed in detail with Mr. Lawry and he is willing to proceed.  2. Hypertension: BP well controlled in office today. He has noted elevated pm blood pressure readings at home. Home BP cuff gave significantly higer reading compared to manual BP reading in office today (140/95 comapred to 112/80 manual). Advised him to purchase a new BP cuff, continue to monitor BP and report BP consistenlty > 130/80. For now,  continue current antihypertensive regimen.   3. Hyperlipidemia: LDL was 40 in 09/2021. Monitored per PCP. Continue ASA, Crestor.   4. Disposition: Follow-up as scheduled with Dr. [name].      Lenna Sciara, NP 08/28/2022, 11:12 AM

## 2022-08-28 NOTE — Progress Notes (Signed)
Office Visit    Patient Name: Devin Martin Date of Encounter: 08/28/2022  Primary Care Provider:  Haydee Salter, MD Primary Cardiologist:  Kirk Ruths, MD  Chief Complaint    77 year old male with a history of CAD s/p DES-M LAD in 2005, coronary vasospasm, hypertension, and hyperlipidemia who presents for follow-up related to CAD.  Past Medical History    Past Medical History:  Diagnosis Date   ANXIETY 10/13/2009   Qualifier: Diagnosis of  By: [name], MD, FACC, [place]   pt wears a lt bk prosthesis   Bronchitis, chronic ([place])    CAD (coronary artery disease)    Non-ST segment elevation myocardial infarction in 2005 with drug-eluting stent to the left anterior descending. 08-2009 - non-ST segment elevation myocardial infarction, felt secondary to vasospasm.   Calculus, kidney    hx   Complication of anesthesia    itching after preop med  ?name - 2013, no SOB   Glaucoma    Hyperlipidemia    Hypertension    Myocardial infarction ([place]) 2005   x2, 2005, 2010   OSA (obstructive sleep apnea)    AHI 68/hr in 2002-uses a cpap   Pneumonia    hx of    Sleep apnea    wears CPAP   Wears glasses    Past Surgical History:  Procedure Laterality Date   AMPUT TRAUM LEG BELOW KNEE UNILT W/O COMPL Left    CARDIAC CATHETERIZATION     CYST REMOVAL TRUNK  09/04/2012   Procedure: CYST REMOVAL TRUNK;  Surgeon: Ralene Ok, MD;  Location: [facility];  Service: General;  Laterality: N/A;  EXCISION OF MID BACK MASS   EYE SURGERY Left DR.SON   CATARACT SX1/2019   INCISION AND DRAINAGE ABSCESS Left 07/31/2013   Procedure: INCISIONAL/NON-INCISIONAL DEBRIDEMENT OF ABSCESS LEFT KNEE;  Surgeon: Mauri Pole, MD;  Location: [facility];  Service: Orthopedics;  Laterality: Left;   KIDNEY STONE SURGERY     multiple episodes    Right Knee Arthroscopy  2010   [facility]   TONSILLECTOMY      Allergies  Allergies  Allergen Reactions   Penicillins  Hives and Other (See Comments)    syncope   Tylox [Oxycodone-Acetaminophen] Other (See Comments)    "messes me up"   Codeine Nausea Only    History of Present Illness    77 year old male with the above past medical history including CAD s/p DES-mLAD in 2005, coronary vasospasm, hypertension, and hyperlipidemia.  He was hospitalized in December 2005 in the setting of NSTEMI s/p DES-mLAD.  EF was 60% at the time.  There was notable jailing of 2 small diagonals by the LAD stent.  Repeat cardiac catheterization in January 2011 in the setting of MI revealed patent stent to LAD, jailing of the 2 diagonal branches with 90% ostial stenoses, 25 to 30% LCx, and 25 to 30% RCA lesion. There was intense vasospasm and it was felt that his infarct was likely secondary to vasospasm.  Abdominal ultrasound in 2017 showed no evidence of aneurysm. Nuclear study in 05/2016 showed EF 52%, septal infarct and mild apical ischemia, treated medically.  He was last seen in the office on 05/18/2021 and was stable overall from a cardiac standpoint.  He denied symptoms concerning for angina.  He contacted our office in December 2023 with concern for elevated BP readings.  Ongoing monitoring was advised.  He contacted our office again on 08/26/2022 with complaints of  increased chest tightness/pressure with exertion, relieved with nitroglycerin.  He presents today for follow-up.  Since his last visit he has been stable overall from a cardiac standpoint though he does note a 1 week history of progressive chest tightness/pressure with exertion, similar to prior anginal equivalent. He is concerned that he "may have another blockage."  Additionally, he notes that over the past month he has had elevated blood pressure readings in the evenings.  However, when compared to a manual BP reading in the office today, his home BP cuff shows significantly higher reading.  Other than his recent elevated home BP readings and progressive chest  tightness/pressure, he denies any additional concerns today.  Home Medications    Current Outpatient Medications  Medication Sig Dispense Refill   AMBULATORY NON FORMULARY MEDICATION Prosthetic Leg Liners 2 application 0   AMBULATORY NON FORMULARY MEDICATION ResMed CPAP Machine     Aspirin 81 MG EC tablet Take 81 mg by mouth every evening.     Cholecalciferol (D3 ADULT PO) Take 1,000 Units by mouth.      Cyanocobalamin (B-12 PO) Take 1,000 mcg by mouth.      diltiazem (TIAZAC) 240 MG 24 hr capsule Take 1 capsule (240 mg total) by mouth daily. 90 capsule 3   ibuprofen (ADVIL) 600 MG tablet Take 600 mg by mouth daily.     irbesartan (AVAPRO) 150 MG tablet Take 0.5 tablets (75 mg total) by mouth daily. 90 tablet 3   isosorbide mononitrate (IMDUR) 60 MG 24 hr tablet TAKE 1 AND 1/2 TABLETS(90 MG) BY MOUTH DAILY 135 tablet 3   meloxicam (MOBIC) 15 MG tablet Take 1 tablet (15 mg total) by mouth daily. Patient takes as needed 30 tablet 1   metoprolol tartrate (LOPRESSOR) 25 MG tablet Take 0.5 tablets (12.5 mg total) by mouth 2 (two) times daily. 90 tablet 3   nitroGLYCERIN (NITROSTAT) 0.4 MG SL tablet PLACE 1 TABLET (0.4 MG TOTAL) UNDER THE TONGUE EVERY 5 (FIVE) MINUTES AS NEEDED FOR CHEST PAIN. 25 tablet 11   Nitroglycerin 0.4 % OINT Apply 1 inch (375 mg) ointment intra-anally every 12 hours for anal fissure 30 g 0   omeprazole (PRILOSEC) 20 MG capsule TAKE 1 CAPSULE(20 MG) BY MOUTH EVERY OTHER DAY 45 capsule 5   potassium citrate (UROCIT-K) 10 MEQ (1080 MG) SR tablet Take 2 tablets (20 mEq total) by mouth daily. 180 tablet 3   rosuvastatin (CRESTOR) 40 MG tablet TAKE 1 TABLET(40 MG) BY MOUTH DAILY 90 tablet 3   Turmeric (QC TUMERIC COMPLEX) 500 MG CAPS Take by mouth.     Current Facility-Administered Medications  Medication Dose Route Frequency Provider Last Rate Last Admin   sodium chloride flush (NS) 0.9 % injection 3 mL  3 mL Intravenous Q12H Toyia Jelinek C, NP         Review of Systems     He denies palpitations, dyspnea, pnd, orthopnea, n, v, dizziness, syncope, edema, weight gain, or early satiety. All other systems reviewed and are otherwise negative except as noted above.   Physical Exam    VS:  BP 112/80 (BP Location: Left Arm, Patient Position: Sitting, Cuff Size: Normal)   Pulse 72   Ht 5' 5.5" (1.664 m)   Wt 190 lb (86.2 kg)   BMI 31.14 kg/m  GEN: Well nourished, well developed, in no acute distress. HEENT: normal. Neck: Supple, no JVD, carotid bruits, or masses. Cardiac: RRR, no murmurs, rubs, or gallops. No clubbing, cyanosis, edema.  Radials/DP/PT 2+ and  equal bilaterally.  Respiratory:  Respirations regular and unlabored, clear to auscultation bilaterally. GI: Soft, nontender, nondistended, BS + x 4. MS: no deformity or atrophy. Skin: warm and dry, no rash. Neuro:  Strength and sensation are intact. Psych: Normal affect.  Accessory Clinical Findings    ECG personally reviewed by me today -NSR, 72 bpm- no acute changes.   Lab Results  Component Value Date   WBC 5.7 04/16/2021   HGB 14.4 04/16/2021   HCT 43.0 04/16/2021   MCV 94.3 04/16/2021   PLT 232.0 04/16/2021   Lab Results  Component Value Date   CREATININE 0.88 07/30/2022   BUN 16 07/30/2022   NA 140 07/30/2022   K 4.3 07/30/2022   CL 104 07/30/2022   CO2 29 07/30/2022   Lab Results  Component Value Date   ALT 18 04/16/2021   AST 16 04/16/2021   ALKPHOS 66 04/16/2021   BILITOT 1.4 (H) 04/16/2021   Lab Results  Component Value Date   CHOL 115 10/19/2021   HDL 62.40 10/19/2021   LDLCALC 40 10/19/2021   LDLDIRECT 150.1 06/24/2007   TRIG 62.0 10/19/2021   CHOLHDL 2 10/19/2021    Lab Results  Component Value Date   HGBA1C 5.8 10/19/2021    Assessment & Plan    1. CAD/exertional chest pain: S/p DES-mLAD with notable jailing of 2 small diagonals by the LAD stent. Cath in January 2011 in the setting of MI revealed patent stent to LAD, jailing of the 2 diagonal branches with 90%  ostial stenoses, 25 to 30% LCx, and 25 to 30% RCA lesion. There was intense vasospasm and it was felt that his infarct was likely secondary to vasospasm. Nuclear study in 05/2016 showed EF 52%, septal infarct and mild apical ischemia, treated medically.  He notes a 1 week history of progressive exertional chest tightness/pressure relieved with nitroglycerin, similar to prior angina equivalent. Discussed with Dr. Sallyanne Kuster, DOD, given history and symptoms will proceed with LHC. This has been scheduled for 08/30/2021 at 9 am with Dr. Ali Lowe. Will start metoprolol tartrate 12.5 mg bid. Will check CBC, BMET today.  Continue aspirin, irbesartan, Imdur, Crestor.  Shared Decision Making/Informed Consent The risks [stroke (1 in 1000), death (1 in 1000), kidney failure [usually temporary] (1 in 500), bleeding (1 in 200), allergic reaction [possibly serious] (1 in 200)], benefits (diagnostic support and management of coronary artery disease) and alternatives of a cardiac catheterization were discussed in detail with Mr. Deyoung and he is willing to proceed.  2. Hypertension: BP well controlled in office today. He has noted elevated pm blood pressure readings at home. Home BP cuff gave significantly higer reading compared to manual BP reading in office today (140/95 comapred to 112/80 manual). Advised him to purchase a new BP cuff, continue to monitor BP and report BP consistenlty > 130/80. For now,  continue current antihypertensive regimen.   3. Hyperlipidemia: LDL was 40 in 09/2021. Monitored per PCP. Continue ASA, Crestor.   4. Disposition: Follow-up as scheduled with Dr. [name].      Lenna Sciara, NP 08/28/2022, 11:12 AM

## 2022-08-28 NOTE — Patient Instructions (Addendum)
Medication Instructions:  Start Metoprolol Tartrate 12.65 mg twice daily  *If you need a refill on your cardiac medications before your next appointment, please call your pharmacy*   Lab Work: BMET & CBC today  If you have labs (blood work) drawn today and your tests are completely normal, you will receive your results only by: [facility] (if you have MyChart) OR A paper copy in the mail If you have any lab test that is abnormal or we need to change your treatment, we will call you to review the results.   Testing/Procedures: Your physician has requested that you have a cardiac catheterization. Cardiac catheterization is used to diagnose and/or treat various heart conditions. Doctors may recommend this procedure for a number of different reasons. The most common reason is to evaluate chest pain. Chest pain can be a symptom of coronary artery disease (CAD), and cardiac catheterization can show whether plaque is narrowing or blocking your heart's arteries. This procedure is also used to evaluate the valves, as well as measure the blood flow and oxygen levels in different parts of your heart. For further information please visit [email or web address]. Please follow instruction sheet, as given.     Follow-Up: At [facility], you and your health needs are our priority.  As part of our continuing mission to provide you with exceptional heart care, we have created designated Provider Care Teams.  These Care Teams include your primary Cardiologist (physician) and Advanced Practice Providers (APPs -  Physician Assistants and Nurse Practitioners) who all work together to provide you with the care you need, when you need it.  We recommend signing up for the patient portal called "MyChart".  Sign up information is provided on this After Visit Summary.  MyChart is used to connect with patients for Virtual Visits (Telemedicine).  Patients are able to view lab/test results, encounter notes,  upcoming appointments, etc.  Non-urgent messages can be sent to your provider as well.   To learn more about what you can do with MyChart, go to [email or web address].    Your next appointment:    Keep follow up   The format for your next appointment:   In Person  Provider:   Dr. [name]     Other Instructions  [facility] A DEPT OF [facility]. [facility] A DEPT OF [place]. [facility] [place] [id number] [place] [address] Dept: [phone] Loc: [phone]  ZEN CEDILLOS  08/28/2022  You are scheduled for a Cardiac Catheterization on Friday, January 5 with Dr. Lenna Sciara.  1. Please arrive at the [facility] (Main Entrance A) at [facility]: [address] [place], [place] [address] at 7:00 AM (This time is two hours before your procedure to ensure your preparation). Free valet parking service is available.   Special note: Every effort is made to have your procedure done on time. Please understand that emergencies sometimes delay scheduled procedures.  2. Diet: Do not eat solid foods after midnight.  The patient may have clear liquids until 5am upon the day of the procedure.  3. Labs: Completed today. CBC & BMET   4. Medication instructions in preparation for your procedure:   Contrast Allergy: No    Current Outpatient Medications (Cardiovascular):    diltiazem (TIAZAC) 240 MG 24 hr capsule, Take 1 capsule (240 mg total) by mouth daily.   irbesartan (AVAPRO) 150 MG tablet, Take 0.5 tablets (75 mg total) by  mouth daily.   isosorbide mononitrate (IMDUR) 60 MG 24 hr tablet, TAKE 1 AND 1/2 TABLETS(90 MG) BY MOUTH DAILY   nitroGLYCERIN (NITROSTAT) 0.4 MG SL tablet, PLACE 1 TABLET (0.4 MG TOTAL) UNDER THE TONGUE EVERY 5 (FIVE) MINUTES AS NEEDED FOR CHEST PAIN.   rosuvastatin (CRESTOR) 40 MG tablet, TAKE 1 TABLET(40 MG) BY MOUTH DAILY   Current Outpatient Medications  (Analgesics):    Aspirin 81 MG EC tablet, Take 81 mg by mouth every evening.   ibuprofen (ADVIL) 600 MG tablet, Take 600 mg by mouth daily.   meloxicam (MOBIC) 15 MG tablet, Take 1 tablet (15 mg total) by mouth daily. Patient takes as needed  Current Outpatient Medications (Hematological):    Cyanocobalamin (B-12 PO), Take 1,000 mcg by mouth.   Current Outpatient Medications (Other):    AMBULATORY NON FORMULARY MEDICATION, Prosthetic Leg Liners   AMBULATORY NON FORMULARY MEDICATION, ResMed CPAP Machine   Cholecalciferol (D3 ADULT PO), Take 1,000 Units by mouth.    Nitroglycerin 0.4 % OINT, Apply 1 inch (375 mg) ointment intra-anally every 12 hours for anal fissure   omeprazole (PRILOSEC) 20 MG capsule, TAKE 1 CAPSULE(20 MG) BY MOUTH EVERY OTHER DAY   potassium citrate (UROCIT-K) 10 MEQ (1080 MG) SR tablet, Take 2 tablets (20 mEq total) by mouth daily.   Turmeric (QC TUMERIC COMPLEX) 500 MG CAPS, Take by mouth. *For reference purposes while preparing patient instructions.   Delete this med list prior to printing instructions for patient.*  Not stopping any meds  On the morning of your procedure, take your Aspirin 81 mg and any morning medicines NOT listed above.  You may use sips of water.  5. Plan for one night stay--bring personal belongings. 6. Bring a current list of your medications and current insurance cards. 7. You MUST have a responsible person to drive you home. 8. Someone MUST be with you the first 24 hours after you arrive home or your discharge will be delayed. 9. Please wear clothes that are easy to get on and off and wear slip-on shoes.  Thank you for allowing [place] to care for you!   -- [facility]   Important Information About Sugar

## 2022-08-28 NOTE — Telephone Encounter (Signed)
Pt called in needing to know if he should cancel his appointment with Dr. Gena Fray on 08/29/22, because he is getting a heart catheterization on I/5/24. Please advise pt [phone] (Mobile)

## 2022-08-29 ENCOUNTER — Other Ambulatory Visit: Payer: Self-pay | Admitting: *Deleted

## 2022-08-29 ENCOUNTER — Ambulatory Visit: Payer: PPO | Admitting: Family Medicine

## 2022-08-29 LAB — BASIC METABOLIC PANEL
BUN/Creatinine Ratio: 20 (ref 10–24)
BUN: 17 mg/dL (ref 8–27)
CO2: 23 mmol/L (ref 20–29)
Calcium: 9.6 mg/dL (ref 8.6–10.2)
Chloride: 102 mmol/L (ref 96–106)
Creatinine, Ser: 0.87 mg/dL (ref 0.76–1.27)
Glucose: 99 mg/dL (ref 70–99)
Potassium: 4.5 mmol/L (ref 3.5–5.2)
Sodium: 139 mmol/L (ref 134–144)
eGFR: 91 mL/min/{1.73_m2} (ref >59)

## 2022-08-29 LAB — CBC
Hematocrit: 46.9 % (ref 37.5–51.0)
Hemoglobin: 15.6 g/dL (ref 13.0–17.7)
MCH: 31.1 pg (ref 26.6–33.0)
MCHC: 33.3 g/dL (ref 31.5–35.7)
MCV: 94 fL (ref 79–97)
Platelets: 272 10*3/uL (ref 150–450)
RBC: 5.01 x10E6/uL (ref 4.14–5.80)
RDW: 12.2 % (ref 11.6–15.4)
WBC: 6 10*3/uL (ref 3.4–10.8)

## 2022-08-29 MED ORDER — DILTIAZEM HCL ER BEADS 240 MG PO CP24: 240.0000 mg | ORAL_CAPSULE | Freq: Every day | ORAL | 3 refills | Status: DC

## 2022-08-29 MED ORDER — ROSUVASTATIN CALCIUM 40 MG PO TABS: ORAL_TABLET | ORAL | 3 refills | Status: DC

## 2022-08-30 ENCOUNTER — Other Ambulatory Visit: Payer: Self-pay

## 2022-08-30 ENCOUNTER — Ambulatory Visit (HOSPITAL_COMMUNITY)
Admission: RE | Disposition: A | Discharge status: Home / Self Care | Payer: Self-pay | Source: Home / Self Care | Attending: Internal Medicine

## 2022-08-30 ENCOUNTER — Observation Stay (HOSPITAL_COMMUNITY)
Admission: RE | Admit: 2022-08-30 | Discharge: 2022-08-30 | Disposition: A | Discharge status: Home / Self Care | Payer: PPO | Attending: Internal Medicine | Admitting: Internal Medicine

## 2022-08-30 ENCOUNTER — Other Ambulatory Visit (HOSPITAL_COMMUNITY): Payer: Self-pay

## 2022-08-30 DIAGNOSIS — I209 Angina pectoris, unspecified: Secondary | ICD-10-CM | POA: Diagnosis present

## 2022-08-30 DIAGNOSIS — Z89519 Acquired absence of unspecified leg below knee: Secondary | ICD-10-CM

## 2022-08-30 DIAGNOSIS — Z7982 Long term (current) use of aspirin: Secondary | ICD-10-CM | POA: Insufficient documentation

## 2022-08-30 DIAGNOSIS — I10 Essential (primary) hypertension: Secondary | ICD-10-CM | POA: Diagnosis not present

## 2022-08-30 DIAGNOSIS — I252 Old myocardial infarction: Secondary | ICD-10-CM | POA: Insufficient documentation

## 2022-08-30 DIAGNOSIS — G4733 Obstructive sleep apnea (adult) (pediatric): Secondary | ICD-10-CM | POA: Diagnosis not present

## 2022-08-30 DIAGNOSIS — Z79899 Other long term (current) drug therapy: Secondary | ICD-10-CM | POA: Insufficient documentation

## 2022-08-30 DIAGNOSIS — E785 Hyperlipidemia, unspecified: Secondary | ICD-10-CM | POA: Diagnosis not present

## 2022-08-30 DIAGNOSIS — Z955 Presence of coronary angioplasty implant and graft: Secondary | ICD-10-CM | POA: Diagnosis not present

## 2022-08-30 DIAGNOSIS — I25118 Atherosclerotic heart disease of native coronary artery with other forms of angina pectoris: Secondary | ICD-10-CM

## 2022-08-30 DIAGNOSIS — T82855A Stenosis of coronary artery stent, initial encounter: Secondary | ICD-10-CM | POA: Insufficient documentation

## 2022-08-30 DIAGNOSIS — I25119 Atherosclerotic heart disease of native coronary artery with unspecified angina pectoris: Secondary | ICD-10-CM | POA: Diagnosis not present

## 2022-08-30 DIAGNOSIS — K219 Gastro-esophageal reflux disease without esophagitis: Secondary | ICD-10-CM | POA: Insufficient documentation

## 2022-08-30 DIAGNOSIS — Y832 Surgical operation with anastomosis, bypass or graft as the cause of abnormal reaction of the patient, or of later complication, without mention of misadventure at the time of the procedure: Secondary | ICD-10-CM | POA: Insufficient documentation

## 2022-08-30 HISTORY — PX: CORONARY STENT INTERVENTION: CATH118234

## 2022-08-30 HISTORY — PX: LEFT HEART CATH AND CORONARY ANGIOGRAPHY: CATH118249

## 2022-08-30 HISTORY — PX: INTRAVASCULAR PRESSURE WIRE/FFR STUDY: CATH118243

## 2022-08-30 HISTORY — PX: INTRAVASCULAR IMAGING/OCT: CATH118326

## 2022-08-30 LAB — POCT ACTIVATED CLOTTING TIME
Activated Clotting Time: 374 seconds
Activated Clotting Time: 244 seconds
Activated Clotting Time: 363 seconds

## 2022-08-30 SURGERY — LEFT HEART CATH AND CORONARY ANGIOGRAPHY
Anesthesia: LOCAL

## 2022-08-30 MED ORDER — ASPIRIN 81 MG PO CHEW: 81.0000 mg | CHEWABLE_TABLET | ORAL | Status: DC

## 2022-08-30 MED ORDER — CLOPIDOGREL BISULFATE 300 MG PO TABS
ORAL_TABLET | ORAL | Status: AC
  Filled 2022-08-30: qty 1

## 2022-08-30 MED ORDER — MIDAZOLAM HCL 2 MG/2ML IJ SOLN
INTRAMUSCULAR | Status: DC | PRN
  Administered 2022-08-30 (×4): 1 mg via INTRAVENOUS

## 2022-08-30 MED ORDER — SODIUM CHLORIDE 0.9% FLUSH: 3.0000 mL | INTRAVENOUS | Status: DC | PRN

## 2022-08-30 MED ORDER — SODIUM CHLORIDE 0.9 % IV SOLN: 250.0000 mL | INTRAVENOUS | Status: DC | PRN

## 2022-08-30 MED ORDER — HYDRALAZINE HCL 20 MG/ML IJ SOLN
INTRAMUSCULAR | Status: DC | PRN
  Administered 2022-08-30: 10 mg via INTRAVENOUS

## 2022-08-30 MED ORDER — LIDOCAINE HCL (PF) 1 % IJ SOLN
INTRAMUSCULAR | Status: AC
  Filled 2022-08-30: qty 30

## 2022-08-30 MED ORDER — HEPARIN (PORCINE) IN NACL 1000-0.9 UT/500ML-% IV SOLN
INTRAVENOUS | Status: DC | PRN
  Administered 2022-08-30 (×2): 500 mL

## 2022-08-30 MED ORDER — HEPARIN SODIUM (PORCINE) 1000 UNIT/ML IJ SOLN
INTRAMUSCULAR | Status: DC | PRN
  Administered 2022-08-30: 3000 [IU] via INTRA_ARTERIAL
  Administered 2022-08-30: 5000 [IU] via INTRA_ARTERIAL
  Administered 2022-08-30: 4000 [IU] via INTRA_ARTERIAL

## 2022-08-30 MED ORDER — MIDAZOLAM HCL 2 MG/2ML IJ SOLN
INTRAMUSCULAR | Status: AC
  Filled 2022-08-30: qty 2

## 2022-08-30 MED ORDER — ASPIRIN 81 MG PO CHEW
81.0000 mg | CHEWABLE_TABLET | ORAL | Status: AC
  Administered 2022-08-30: 81 mg via ORAL
  Filled 2022-08-30: qty 1

## 2022-08-30 MED ORDER — SODIUM CHLORIDE 0.9 % WEIGHT BASED INFUSION: 1.0000 mL/kg/h | INTRAVENOUS | Status: DC

## 2022-08-30 MED ORDER — HEPARIN SODIUM (PORCINE) 1000 UNIT/ML IJ SOLN
INTRAMUSCULAR | Status: AC
  Filled 2022-08-30: qty 10

## 2022-08-30 MED ORDER — LABETALOL HCL 5 MG/ML IV SOLN: 10.0000 mg | INTRAVENOUS | Status: DC | PRN

## 2022-08-30 MED ORDER — NITROGLYCERIN 0.4 MG SL SUBL
0.4000 mg | SUBLINGUAL_TABLET | SUBLINGUAL | 2 refills | Status: AC | PRN
  Filled 2022-08-30: qty 25, 30d supply, fill #0

## 2022-08-30 MED ORDER — SODIUM CHLORIDE 0.9% FLUSH: 3.0000 mL | Freq: Two times a day (BID) | INTRAVENOUS | Status: DC

## 2022-08-30 MED ORDER — VERAPAMIL HCL 2.5 MG/ML IV SOLN
INTRAVENOUS | Status: AC
  Filled 2022-08-30: qty 2

## 2022-08-30 MED ORDER — NITROGLYCERIN 1 MG/10 ML FOR IR/CATH LAB
INTRA_ARTERIAL | Status: AC
  Filled 2022-08-30: qty 10

## 2022-08-30 MED ORDER — SODIUM CHLORIDE 0.9 % WEIGHT BASED INFUSION
3.0000 mL/kg/h | INTRAVENOUS | Status: AC
  Administered 2022-08-30: 3 mL/kg/h via INTRAVENOUS

## 2022-08-30 MED ORDER — ASPIRIN 81 MG PO TBDP: 81.0000 mg | ORAL_TABLET | Freq: Every day | ORAL | Status: AC

## 2022-08-30 MED ORDER — SODIUM CHLORIDE 0.9 % IV SOLN: INTRAVENOUS | Status: AC

## 2022-08-30 MED ORDER — HYDRALAZINE HCL 20 MG/ML IJ SOLN: 10.0000 mg | INTRAMUSCULAR | Status: DC | PRN

## 2022-08-30 MED ORDER — HEPARIN (PORCINE) IN NACL 1000-0.9 UT/500ML-% IV SOLN
INTRAVENOUS | Status: AC
  Filled 2022-08-30: qty 500

## 2022-08-30 MED ORDER — VERAPAMIL HCL 2.5 MG/ML IV SOLN
INTRAVENOUS | Status: DC | PRN
  Administered 2022-08-30: 10 mL via INTRA_ARTERIAL

## 2022-08-30 MED ORDER — ASPIRIN 81 MG PO CHEW: 81.0000 mg | CHEWABLE_TABLET | Freq: Every day | ORAL | Status: DC

## 2022-08-30 MED ORDER — FENTANYL CITRATE (PF) 100 MCG/2ML IJ SOLN
INTRAMUSCULAR | Status: AC
  Filled 2022-08-30: qty 2

## 2022-08-30 MED ORDER — CLOPIDOGREL BISULFATE 300 MG PO TABS
ORAL_TABLET | ORAL | Status: DC | PRN
  Administered 2022-08-30: 600 mg via ORAL

## 2022-08-30 MED ORDER — FENTANYL CITRATE (PF) 100 MCG/2ML IJ SOLN
INTRAMUSCULAR | Status: DC | PRN
  Administered 2022-08-30 (×4): 25 ug via INTRAVENOUS

## 2022-08-30 MED ORDER — ACETAMINOPHEN 325 MG PO TABS: 650.0000 mg | ORAL_TABLET | ORAL | Status: DC | PRN

## 2022-08-30 MED ORDER — HYDRALAZINE HCL 20 MG/ML IJ SOLN
INTRAMUSCULAR | Status: AC
  Filled 2022-08-30: qty 1

## 2022-08-30 MED ORDER — CLOPIDOGREL BISULFATE 75 MG PO TABS: 75.0000 mg | ORAL_TABLET | Freq: Every day | ORAL | Status: DC

## 2022-08-30 MED ORDER — CLOPIDOGREL BISULFATE 75 MG PO TABS
75.0000 mg | ORAL_TABLET | Freq: Every day | ORAL | 5 refills | Status: DC
  Filled 2022-08-30: qty 30, 30d supply, fill #0

## 2022-08-30 MED ORDER — ONDANSETRON HCL 4 MG/2ML IJ SOLN: 4.0000 mg | Freq: Four times a day (QID) | INTRAMUSCULAR | Status: DC | PRN

## 2022-08-30 MED ORDER — IOHEXOL 350 MG/ML SOLN
INTRAVENOUS | Status: DC | PRN
  Administered 2022-08-30: 130 mL

## 2022-08-30 MED ORDER — LIDOCAINE HCL (PF) 1 % IJ SOLN
INTRAMUSCULAR | Status: DC | PRN
  Administered 2022-08-30: 2 mL

## 2022-08-30 SURGICAL SUPPLY — 26 items
BALL SAPPHIRE NC24 3.25X18 (BALLOONS) ×1
BALLN EMERGE MR 3.0X15 (BALLOONS) ×1
BALLN SAPPHIRE 2.5X12 (BALLOONS) ×1
BALLOON EMERGE MR 3.0X15 (BALLOONS) IMPLANT
BALLOON SAPPHIRE 2.5X12 (BALLOONS) IMPLANT
BALLOON SAPPHIRE NC24 3.25X18 (BALLOONS) IMPLANT
CATH DIAG 6FR PIGTAIL ANGLED (CATHETERS) IMPLANT
CATH DRAGONFLY OPSTAR (CATHETERS) IMPLANT
CATH LAUNCHER 6FR EBU3.5 (CATHETERS) IMPLANT
CATH OPTITORQUE TIG 4.0 6F (CATHETERS) IMPLANT
DEVICE RAD COMP TR BAND LRG (VASCULAR PRODUCTS) IMPLANT
GLIDESHEATH SLEND SS 6F .021 (SHEATH) IMPLANT
GUIDEWIRE PRESSURE X 175 (WIRE) IMPLANT
GUIDEWIRE VAS SION BLUE 190 (WIRE) IMPLANT
KIT ENCORE 26 ADVANTAGE (KITS) IMPLANT
KIT HEART LEFT (KITS) ×1 IMPLANT
PACK CARDIAC CATHETERIZATION (CUSTOM PROCEDURE TRAY) ×1 IMPLANT
SHEATH PROBE COVER 6X72 (BAG) IMPLANT
STENT SYNERGY XD 3.0X24 (Permanent Stent) IMPLANT
SYNERGY XD 3.0X24 (Permanent Stent) ×1 IMPLANT
TRANSDUCER W/STOPCOCK (MISCELLANEOUS) ×1 IMPLANT
TUBING CIL FLEX 10 FLL-RA (TUBING) ×1 IMPLANT
VALVE GUARDIAN II ~~LOC~~ HEMO (MISCELLANEOUS) IMPLANT
WIRE EMERALD 3MM-J .035X260CM (WIRE) IMPLANT
WIRE EMERALD ST .035X260CM (WIRE) IMPLANT
WIRE MICROINTRODUCER 60CM (WIRE) IMPLANT

## 2022-08-30 NOTE — Progress Notes (Signed)
Dr. Ali Lowe to bedside, pt and wife present, orders to be changed for pt to discharge home later today, Dr. Ali Lowe to speak to Cath Lab front desk,  pt to transfer to short stay, safety maintained, no c/o pain presently

## 2022-08-30 NOTE — Interval H&P Note (Signed)
History and Physical Interval Note:  08/30/2022 7:35 AM  Devin Martin  has presented today for surgery, with the diagnosis of cad.  The various methods of treatment have been discussed with the patient and family. After consideration of risks, benefits and other options for treatment, the patient has consented to  Procedure(s): LEFT HEART CATH AND CORONARY ANGIOGRAPHY (N/A) as a surgical intervention.  The patient's history has been reviewed, patient examined, no change in status, stable for surgery.  I have reviewed the patient's chart and labs.  Questions were answered to the patient's satisfaction.     Cath Lab Visit (complete for each Cath Lab visit)  Clinical Evaluation Leading to the Procedure:   ACS: No.  Non-ACS:    Anginal Classification: CCS II  Anti-ischemic medical therapy: Maximal Therapy (2 or more classes of medications)  Non-Invasive Test Results: No non-invasive testing performed  Prior CABG: No previous CABG        Early [place]

## 2022-08-30 NOTE — Discharge Summary (Addendum)
Discharge Summary for Same Day PCI   Patient ID: Devin Martin MRN: [id number]; DOB: Jan 04, 1946  Admit date: 08/30/2022 Discharge date: 08/30/2022  Primary Care Provider: Haydee Salter, MD  Primary Cardiologist: Kirk Ruths, MD  Primary Electrophysiologist:  None   Discharge Diagnoses    Principal Problem:   Angina pectoris [facility]) Active Problems:   Hyperlipidemia   Obstructive sleep apnea   Essential hypertension   Coronary artery disease involving native coronary artery of native heart with angina pectoris ([place])   GERD   S/P BKA (below knee amputation) [facility])    Diagnostic Studies/Procedures    Cardiac Catheterization 08/30/2022:   Prox Cx lesion is 90% stenosed.   Prox LAD to Mid LAD lesion is 50% stenosed.   Ost LAD to Prox LAD lesion is 25% stenosed.   A stent was successfully placed.   Post intervention, there is a 0% residual stenosis.   1.  Patent proximal LAD stent with moderate in-stent restenosis with an RFR of 0.92; intervention was therefore deferred. 2.  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 3.  LVEDP of 20 mmHg   Recommendation: Due to ongoing chest discomfort likely due to arterial stretch we will monitor the patient overnight with plan discharge in the morning.  Plan on preferably 6 months of dual antiplatelet therapy and then Plavix monotherapy thereafter.  Diagnostic Dominance: Right  Intervention       _____________   History of Present Illness     Devin Martin is a 77 y.o. male with CAD with NSTEMI in 07/2004 s/p DES to LAD, coronary vasospasm, s/p left below knee amputation, hypertension, hyperlipidemia, and obstructive sleep apnea on CPAP who is followed by Dr. [name]. He was recently seen in the office by Diona Browner, NP, on 08/28/2022 at which time he reported 1 week of progressive chest tightness / pressure with exertion. He stated this was similar to his  prior anginal symptoms and was concerned he may have another blockage. He also reported elevated BP readings in the evenings for the past month. However, home BP machine was reading significantly higher when compared to manual read in the office.  Cardiac catheterization was arranged for further evaluation.  Hospital Course     Patient presented to [facility] on 08/30/2022 for planned cardiac catheterization. LHC showed 90% stenosis of proximal LCX which was successfully treated with DES. Also showed patent proximal LAD stent  moderate in-stent restenosis. RFR was 0.92 so no intervention was performed. Patient tolerated the procedure well. He was initially having some chest discomfort following cath which was felt to be due to arterial stretching so plan was to monitor patient overnight. However, symptoms then resolved and patient wanted to go home. Dr. Ali Lowe felt patient was stable for discharge home. Plan is for DAPT with Aspirin '81mg'$  daily and Plavix '75mg'$  daily for at least 6 months and then Plavix monotherapy recommended.  The patient was seen by Cardiac Rehab while in short stay.  Right radial cath site was re-evaluated prior to discharge and found to be stable without any complications. Instructions/precautions regarding cath site care were given prior to discharge.  [name] was seen by Dr. Ali Lowe and determined stable for discharge home. Follow up with our office has been arranged. Medications are listed below. Pertinent changes include addition of Plavix. Patient is on Omeprazole at home for reflux. This was stopped due to its interaction with Plavix. I  offered to prescribe Protonix instead but patient stated he would like to see if he could go without any reflux medications. Recommended avoiding NSAIDs and using over-the-counter Tylenol as needed for pain instead. Also advised patient to continue to keep a log of his BP and bring this to his follow-up visit.     _____________  Cath/PCI Registry Performance & Quality Measures: Aspirin prescribed? - Yes ADP Receptor Inhibitor (Plavix/Clopidogrel, Brilinta/Ticagrelor or Effient/Prasugrel) prescribed (includes medically managed patients)? - Yes High Intensity Statin (Lipitor 40-'80mg'$  or Crestor 20-'40mg'$ ) prescribed? - Yes For EF <40%, was ACEI/ARB prescribed? - Not Applicable (EF >/= 61%) For EF <40%, Aldosterone Antagonist (Spironolactone or Eplerenone) prescribed? - Not Applicable (EF >/= 22%) Cardiac Rehab Phase II ordered (Included Medically managed Patients)? - Yes  _____________   Discharge Vitals Blood pressure 137/66, pulse 70, temperature (!) 97.5 F (36.4 C), temperature source Temporal, resp. rate 11, height 5' 5.5" (1.664 m), weight 86.2 kg, SpO2 98 %.  Filed Weights   08/30/22 0742  Weight: 86.2 kg    Last Labs & Radiologic Studies    CBC Recent Labs    08/28/22 1104  WBC 6.0  HGB 15.6  HCT 46.9  MCV 94  PLT 449   Basic Metabolic Panel Recent Labs    08/28/22 1104  NA 139  K 4.5  CL 102  CO2 23  GLUCOSE 99  BUN 17  CREATININE 0.87  CALCIUM 9.6   Liver Function Tests No results for input(s): "AST", "ALT", "ALKPHOS", "BILITOT", "PROT", "ALBUMIN" in the last 72 hours. No results for input(s): "LIPASE", "AMYLASE" in the last 72 hours. High Sensitivity Troponin:   No results for input(s): "TROPONINIHS" in the last 720 hours.  BNP Invalid input(s): "POCBNP" D-Dimer No results for input(s): "DDIMER" in the last 72 hours. Hemoglobin A1C No results for input(s): "HGBA1C" in the last 72 hours. Fasting Lipid Panel No results for input(s): "CHOL", "HDL", "LDLCALC", "TRIG", "CHOLHDL", "LDLDIRECT" in the last 72 hours. Thyroid Function Tests No results for input(s): "TSH", "T4TOTAL", "T3FREE", "THYROIDAB" in the last 72 hours.  Invalid input(s): "FREET3" _____________  CARDIAC CATHETERIZATION  Result Date: 08/30/2022   Prox Cx lesion is 90% stenosed.   Prox LAD to  Mid LAD lesion is 50% stenosed.   Ost LAD to Prox LAD lesion is 25% stenosed.   A stent was successfully placed.   Post intervention, there is a 0% residual stenosis. 1.  Patent proximal LAD stent with moderate in-stent restenosis with an RFR of 0.92; intervention was therefore deferred. 2.  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 3.  LVEDP of 20 mmHg Recommendation: Due to ongoing chest discomfort likely due to arterial stretch we will monitor the patient overnight with plan discharge in the morning.  Plan on preferably 6 months of dual antiplatelet therapy and then Plavix monotherapy thereafter.    Disposition   Patient is being discharged home today in good condition.  Follow-up Plans & Appointments     Follow-up Information     Lelon Perla, MD Follow up.   Specialty: Cardiology Why: Please keep follow-up visit scheduled for 09/30/2022 at 10:30am. Please arrive 15 minutes early for check-in. Contact information: [place] [phone]                Discharge Instructions     Amb Referral to Cardiac Rehabilitation   Complete by: As directed    Diagnosis: Coronary  Stents   After initial evaluation and assessments completed: Virtual Based Care may be provided alone or in conjunction with Phase 2 Cardiac Rehab based on patient barriers.: Yes   Intensive Cardiac Rehabilitation (ICR) [place] location only OR Traditional Cardiac Rehabilitation (TCR) *If criteria for ICR are not met will enroll in TCR [facility] only): Yes        Discharge Medications   Allergies as of 08/30/2022       Reactions   Penicillins Hives, Other (See Comments)   syncope   Tylox [oxycodone-acetaminophen] Other (See Comments)   "messes me up"   Codeine Nausea Only        Medication List     STOP taking these medications    ibuprofen 200 MG tablet Commonly known as: ADVIL   meloxicam  15 MG tablet Commonly known as: MOBIC   omeprazole 20 MG capsule Commonly known as: PRILOSEC       TAKE these medications    AMBULATORY NON FORMULARY MEDICATION ResMed CPAP Machine   AMBULATORY NON FORMULARY MEDICATION Prosthetic Leg Liners   Aspirin 81 MG EC tablet Take 1 tablet (81 mg total) by mouth daily. What changed: when to take this   B-12 PO Take 1 tablet by mouth every evening.   clopidogrel 75 MG tablet Commonly known as: PLAVIX Take 1 tablet (75 mg total) by mouth daily with breakfast. Start taking on: August 31, 2022   D3 ADULT PO Take 1 tablet by mouth every evening.   diltiazem 240 MG 24 hr capsule Commonly known as: TIAZAC Take 1 capsule (240 mg total) by mouth daily.   irbesartan 150 MG tablet Commonly known as: Avapro Take 0.5 tablets (75 mg total) by mouth daily.   isosorbide mononitrate 60 MG 24 hr tablet Commonly known as: IMDUR TAKE 1 AND 1/2 TABLETS(90 MG) BY MOUTH DAILY What changed: See the new instructions.   metoprolol tartrate 25 MG tablet Commonly known as: LOPRESSOR Take 0.5 tablets (12.5 mg total) by mouth 2 (two) times daily.   Nitroglycerin 0.4 % Oint Apply 1 inch (375 mg) ointment intra-anally every 12 hours for anal fissure   nitroGLYCERIN 0.4 MG SL tablet Commonly known as: Nitrostat Place 1 tablet (0.4 mg total) under the tongue every 5 minutes as needed for chest pain. What changed:  how much to take how to take this when to take this reasons to take this additional instructions   potassium citrate 10 MEQ (1080 MG) SR tablet Commonly known as: UROCIT-K Take 2 tablets (20 mEq total) by mouth daily.   rosuvastatin 40 MG tablet Commonly known as: CRESTOR TAKE 1 TABLET(40 MG) BY MOUTH DAILY   TURMERIC PO Take 1 capsule by mouth daily.           Allergies Allergies  Allergen Reactions   Penicillins Hives and Other (See Comments)    syncope   Tylox [Oxycodone-Acetaminophen] Other (See Comments)     "messes me up"   Codeine Nausea Only    Outstanding Labs/Studies   N/A  Duration of Discharge Encounter   Greater than 30 minutes including physician time.  Signed, Darreld Mclean, PA-C 08/30/2022, 3:36 PM   ATTENDING ATTESTATION:  After conducting a review of all available clinical information with the care team, interviewing the patient, and performing a physical exam, I agree with the findings and plan described in this note.   GEN: No acute distress.   HEENT:  MMM, no JVD, no scleral icterus Cardiac: RRR, no murmurs, rubs, or  gallops.  Respiratory: Clear to auscultation bilaterally. GI: Soft, nontender, non-distended  MS: No edema; No deformity. Neuro:  Nonfocal  Vasc:  R radial TR band in place  Patient doing well after uncomplicated PCI of proximal Lcx lesion.  Discharge today with close hospital follow up.  DAPT for 3 but ideally 6 months then plavix monotherapy thereafter.   Lenna Sciara, MD Pager [phone]

## 2022-08-30 NOTE — Progress Notes (Signed)
TR BAND REMOVAL  LOCATION:    right radial  DEFLATED PER PROTOCOL:    Yes.    TIME BAND OFF / DRESSING APPLIED:    1515 gause dressing applied   SITE UPON ARRIVAL:    Level 0  SITE AFTER BAND REMOVAL:    Level 0  CIRCULATION SENSATION AND MOVEMENT:    Within Normal Limits   Yes.    COMMENTS:   no issues noted

## 2022-08-30 NOTE — Interval H&P Note (Signed)
History and Physical Interval Note:  08/30/2022 6:55 AM  Devin Martin  has presented today for surgery, with the diagnosis of cad.  The various methods of treatment have been discussed with the patient and family. After consideration of risks, benefits and other options for treatment, the patient has consented to  Procedure(s): LEFT HEART CATH AND CORONARY ANGIOGRAPHY (N/A) as a surgical intervention.  The patient's history has been reviewed, patient examined, no change in status, stable for surgery.  I have reviewed the patient's chart and labs.  Questions were answered to the patient's satisfaction.    Cath Lab Visit (complete for each Cath Lab visit)  Clinical Evaluation Leading to the Procedure:   ACS: No.  Non-ACS:    Anginal Classification: CCS II  Anti-ischemic medical therapy: Maximal Therapy (2 or more classes of medications)  Non-Invasive Test Results: No non-invasive testing performed  Prior CABG: No previous CABG        Early Osmond

## 2022-08-30 NOTE — Progress Notes (Signed)
CARDIAC REHAB PHASE I    Post stent education including antiplatelet therapy importance, site care, heart healthy diet, exercise guidelines, restrictions, risk factors and CRP2 reviewed. All questions and concerns addressed. Will refer to [facility] for CRP2. Plan for home later today.   [id number]   Vanessa Barbara, RN BSN 08/30/2022 1:33 PM

## 2022-08-30 NOTE — Discharge Instructions (Addendum)
Medication Changes: - START Plavix '75mg'$  daily in addition to the baby Aspirin ('81mg'$  daily) which you are already taking). These medications are very important in helping keep the new stent in your heart open. - STOP Omeprazole as this interferes with Plavix. If you need something else for reflux, you can use Pantoprazole (Protonix). This is a prescription medication so if you need this you can let your PCP or Dr. Jacalyn Lefevre office know. - STOP NSAIDs (Ibuprofen, Advil, Aleve, Meloxicam). Recommend over the counter Tylenol as needed for pain instead.  Continue all other home medications as listed elsewhere on discharge paperwork. Please keep a log of your blood pressure and bring this to your follow-up visit.   Post Cardiac Catheterization - No heavy lifting or sexual activity x7 days. - No soaking baths, hots tubs, pools, etc x7 days. - No driving for 2-3 days. - Drink plenty of fluid for 48 hours and keep wrist elevated at heart level for 24 hours  Radial Site Care   This sheet gives you information about how to care for yourself after your procedure. Your health care provider may also give you more specific instructions. If you have problems or questions, contact your health care provider. What can I expect after the procedure? After the procedure, it is common to have: Bruising and tenderness at the catheter insertion area. Follow these instructions at home: Medicines Take over-the-counter and prescription medicines only as told by your health care provider. Insertion site care Follow instructions from your health care provider about how to take care of your insertion site. Make sure you: Wash your hands with soap and water before you change your bandage (dressing). If soap and water are not available, use hand sanitizer. remove your dressing as told by your health care provider. In 24 hours Check your insertion site every day for signs of infection. Check for: Redness, swelling, or  pain. Fluid or blood. Pus or a bad smell. Warmth. Do not take baths, swim, or use a hot tub until your health care provider approves. You may shower 24-48 hours after the procedure, or as directed by your health care provider. Remove the dressing and gently wash the site with plain soap and water. Pat the area dry with a clean towel. Do not rub the site. That could cause bleeding. Do not apply powder or lotion to the site. Activity   For 24 hours after the procedure, or as directed by your health care provider: Do not flex or bend the affected arm. Do not push or pull heavy objects with the affected arm. Do not drive yourself home from the hospital or clinic. You may drive 24 hours after the procedure unless your health care provider tells you not to. Do not operate machinery or power tools. Do not lift anything that is heavier than 10 lb (4.5 kg), or the limit that you are told, until your health care provider says that it is safe. For 4 days Ask your health care provider when it is okay to: Return to work or school. Resume usual physical activities or sports. Resume sexual activity. General instructions If the catheter site starts to bleed, raise your arm and put firm pressure on the site. If the bleeding does not stop, get help right away. This is a medical emergency. If you went home on the same day as your procedure, a responsible adult should be with you for the first 24 hours after you arrive home. Keep all follow-up visits as told by  your health care provider. This is important. Contact a health care provider if: You have a fever. You have redness, swelling, or yellow drainage around your insertion site. Get help right away if: You have unusual pain at the radial site. The catheter insertion area swells very fast. The insertion area is bleeding, and the bleeding does not stop when you hold steady pressure on the area. Your arm or hand becomes pale, cool, tingly, or  numb. These symptoms may represent a serious problem that is an emergency. Do not wait to see if the symptoms will go away. Get medical help right away. Call your local emergency services (911 in the [place] Do not drive yourself to the hospital. Summary After the procedure, it is common to have bruising and tenderness at the site. Follow instructions from your health care provider about how to take care of your radial site wound. Check the wound every day for signs of infection. Do not lift anything that is heavier than 10 lb (4.5 kg), or the limit that you are told, until your health care provider says that it is safe. This information is not intended to replace advice given to you by your health care provider. Make sure you discuss any questions you have with your health care provider. Document Revised: 09/17/2017 Document Reviewed: 09/17/2017 Elsevier Patient Education  2020 [employer].

## 2022-09-02 ENCOUNTER — Encounter: Payer: Self-pay | Admitting: Cardiology

## 2022-09-02 ENCOUNTER — Encounter (HOSPITAL_COMMUNITY): Payer: Self-pay | Admitting: Internal Medicine

## 2022-09-02 ENCOUNTER — Other Ambulatory Visit (HOSPITAL_COMMUNITY): Payer: Self-pay

## 2022-09-02 MED FILL — Nitroglycerin IV Soln 100 MCG/ML in D5W: INTRA_ARTERIAL | Qty: 10 | Status: AC

## 2022-09-05 ENCOUNTER — Encounter: Payer: Self-pay | Admitting: Internal Medicine

## 2022-09-05 ENCOUNTER — Ambulatory Visit: Payer: PPO | Admitting: Internal Medicine

## 2022-09-05 VITALS — BP 146/72 | HR 59 | Ht 65.0 in | Wt 192.0 lb

## 2022-09-05 DIAGNOSIS — Z86010 Personal history of colonic polyps: Secondary | ICD-10-CM | POA: Diagnosis not present

## 2022-09-05 DIAGNOSIS — K602 Anal fissure, unspecified: Secondary | ICD-10-CM | POA: Diagnosis not present

## 2022-09-05 MED ORDER — NITROGLYCERIN 0.4 % RE OINT: TOPICAL_OINTMENT | RECTAL | 2 refills | Status: DC

## 2022-09-05 NOTE — Patient Instructions (Signed)
_______________________________________________________  If you are age 75 or older, your body mass index should be between 23-30. Your Body mass index is 31.95 kg/m. If this is out of the aforementioned range listed, please consider follow up with your Primary Care Provider.  If you are age 79 or younger, your body mass index should be between 19-25. Your Body mass index is 31.95 kg/m. If this is out of the aformentioned range listed, please consider follow up with your Primary Care Provider.   ________________________________________________________  The [facility] providers would like to encourage you to use [facility] to communicate with providers for non-urgent requests or questions.  Due to long hold times on the telephone, sending your provider a message by [facility] may be a faster and more efficient way to get a response.  Please allow 48 business hours for a response.  Please remember that this is for non-urgent requests.  _______________________________________________________  We have sent the following medications to your pharmacy for you to pick up at your convenience:  Nitroglycerin  Take 2 tablespoons of Metamucil daily  Sitz baths

## 2022-09-05 NOTE — Progress Notes (Signed)
HISTORY OF PRESENT ILLNESS:  Devin Martin is a 77 y.o. male with past medical history as listed below who is sent today by his primary care provider regarding rectal pain.  The patient has been seen previously in this office for history of adenomatous colon polyps (diminutive) for which she has undergone colonoscopy in 2002, 2005, 2010, and 2016.  He is also noted to have left-sided diverticulosis.  His last examination was without neoplasia.  Follow-up in 10 years recommended.  He was last seen in this office November 01, 2016 for epigastric pain, GERD, and dark stools.  Negative CT scan.  Subsequently underwent upper endoscopy November 27, 2016.  This was normal.  His pain was felt to be due to symptomatic cholelithiasis.  He was referred to general surgery.  He has not had cholecystectomy.  Patient tells me that approximately 3 months ago, while on a road trip, he developed constipation.  He subsequently passed a hard stool which was painful.  Thereafter, significant pain with defecation.  No bleeding.  He saw his primary care provider who diagnosed him with fissure and prescribed nitroglycerin ointment.  Patient has been applying this 2 times per day.  Overall things have improved.  He tolerates this well without headache or dizziness.  He did have pain approximately 1 week ago.  He has been concerned about possible causes for this problem.  GI consultation sought.  His GI review of systems is otherwise negative.  Review of blood work from January 2024 shows normal metabolic panel and normal CBC.  REVIEW OF SYSTEMS:  All non-GI ROS negative unless otherwise stated in the HPI except for anxiety, arthritis,  Past Medical History:  Diagnosis Date   ANXIETY 10/13/2009   Qualifier: Diagnosis of  By: [name], MD, FACC, [place]   pt wears a lt bk prosthesis   Bronchitis, chronic ([place])    CAD (coronary artery disease)    Non-ST segment elevation myocardial infarction in 2005 with  drug-eluting stent to the left anterior descending. 08-2009 - non-ST segment elevation myocardial infarction, felt secondary to vasospasm.   Calculus, kidney    hx   Complication of anesthesia    itching after preop med  ?name - 2013, no SOB   Glaucoma    Hyperlipidemia    Hypertension    Myocardial infarction ([place]) 2005   x2, 2005, 2010   OSA (obstructive sleep apnea)    AHI 68/hr in 2002-uses a cpap   Pneumonia    hx of    Sleep apnea    wears CPAP   Wears glasses     Past Surgical History:  Procedure Laterality Date   AMPUT TRAUM LEG BELOW KNEE UNILT W/O COMPL Left    CARDIAC CATHETERIZATION     CORONARY STENT INTERVENTION N/A 08/30/2022   Procedure: CORONARY STENT INTERVENTION;  Surgeon: Early Osmond, MD;  Location: [facility];  Service: Cardiovascular;  Laterality: N/A;   CYST REMOVAL TRUNK  09/04/2012   Procedure: CYST REMOVAL TRUNK;  Surgeon: Ralene Ok, MD;  Location: [facility];  Service: General;  Laterality: N/A;  EXCISION OF MID BACK MASS   EYE SURGERY Left DR.SON   CATARACT SX1/2019   INCISION AND DRAINAGE ABSCESS Left 07/31/2013   Procedure: INCISIONAL/NON-INCISIONAL DEBRIDEMENT OF ABSCESS LEFT KNEE;  Surgeon: Mauri Pole, MD;  [facility];  Service: Orthopedics;  Laterality: Left;   INTRAVASCULAR IMAGING/OCT N/A 08/30/2022   Procedure: INTRAVASCULAR IMAGING/OCT;  Surgeon:  Early Osmond, MD;  Location: [facility];  Service: Cardiovascular;  Laterality: N/A;   INTRAVASCULAR PRESSURE WIRE/FFR STUDY N/A 08/30/2022   Procedure: INTRAVASCULAR PRESSURE WIRE/FFR STUDY;  Surgeon: Early Osmond, MD;  Location: [facility];  Service: Cardiovascular;  Laterality: N/A;   KIDNEY STONE SURGERY     multiple episodes    LEFT HEART CATH AND CORONARY ANGIOGRAPHY N/A 08/30/2022   Procedure: LEFT HEART CATH AND CORONARY ANGIOGRAPHY;  Surgeon: Early Osmond, MD;  Location: [facility];  Service: Cardiovascular;  Laterality:  N/A;   Right Knee Arthroscopy  2010   [facility]   TONSILLECTOMY      Social History Devin Martin  reports that he has quit smoking. His smoking use included cigarettes and e-cigarettes. He has a 15.00 pack-year smoking history. He has never used smokeless tobacco. He reports current alcohol use. He reports that he does not use drugs.  family history includes Cancer in his mother; Heart attack in his paternal grandfather; Heart attack (age of onset: 24) in his father; Stroke in his mother.  Allergies  Allergen Reactions   Penicillins Hives and Other (See Comments)    syncope   Tylox [Oxycodone-Acetaminophen] Other (See Comments)    "messes me up"   Codeine Nausea Only       PHYSICAL EXAMINATION: Vital signs: BP (!) 146/72   Pulse (!) 59   Ht '5\' 5"'$  (1.651 m)   Wt 192 lb (87.1 kg)   SpO2 98%   BMI 31.95 kg/m   Constitutional: generally well-appearing, no acute distress Psychiatric: alert and oriented x3, cooperative Eyes: extraocular movements intact, anicteric, conjunctiva pink Mouth: oral pharynx moist, no lesions Neck: supple no lymphadenopathy Cardiovascular: heart regular rate and rhythm, no murmur Lungs: clear to auscultation bilaterally Abdomen: soft, nontender, nondistended, no obvious ascites, no peritoneal signs, normal bowel sounds, no organomegaly Rectal: No external abnormalities.  Slightly tender posterior fissure.  Brown stool Extremities: no clubbing, cyanosis, or lower extremity edema in the right leg.  Status post left BKA Skin: no lesions on visible extremities Neuro: No focal deficits. No asterixis.    ASSESSMENT:  1.  Posterior anal fissure. 2.  History of diminutive adenomatous polyps.  Last colonoscopy 2016 3.  General medical problems 4.  Cholelithiasis   PLAN:  1.  Continue with nitroglycerin ointment.  Refilled.  Medication risk reviewed.  Told to apply 5 times daily. 2.  Sitz bath 3.  Metamucil 2 tablespoons daily 4.  Due for  routine surveillance colonoscopy around January 2026 5.  Interval follow-up as needed A total time of 45 minutes was spent preparing to see the patient, reviewing data, obtaining comprehensive history, performing medically appropriate physical examination, counseling and educating the patient regarding the above listed issues, ordering medication, and documenting clinical information in the health record.

## 2022-09-06 ENCOUNTER — Telehealth: Payer: Self-pay | Admitting: Family Medicine

## 2022-09-06 ENCOUNTER — Encounter: Payer: Self-pay | Admitting: Cardiology

## 2022-09-06 NOTE — Telephone Encounter (Signed)
Called pt, he states he only feels weak. Pt denies any other symptoms. He states "It was a little alarming. I was able to do my workout but it was difficult. Pt states his blood pressure was 99/53 HR 44 at 10:12am. While on the phone pt took his blood pressure it was 120/59 HR 41. He states "I'm going to sit here and watch tv. Call me later, I'll continue to take my pressure about every half hour to see what it does. If it drops down below 100 again I'll go to my PCP, he is just right down the road."   Pt made aware of Dr. Jacalyn Lefevre recommendations.

## 2022-09-06 NOTE — Telephone Encounter (Signed)
Caller Name: Glenard Keesling Call back phone #: [phone]  Reason for Call: Please call pt. He stopped by with new bp cuff and doesn't believe it is reading correctly. Pt is wanting cma to look at however all clinical was on lunch.

## 2022-09-06 NOTE — Telephone Encounter (Signed)
Patient came in and I checked his BP with his machine and manually,  I got a BP of 142/70 and his machine 143/78.  Dm/cma

## 2022-09-07 ENCOUNTER — Encounter: Payer: Self-pay | Admitting: Internal Medicine

## 2022-09-07 DIAGNOSIS — G4733 Obstructive sleep apnea (adult) (pediatric): Secondary | ICD-10-CM | POA: Diagnosis not present

## 2022-09-08 ENCOUNTER — Other Ambulatory Visit: Payer: Self-pay | Admitting: Family Medicine

## 2022-09-08 DIAGNOSIS — K6289 Other specified diseases of anus and rectum: Secondary | ICD-10-CM

## 2022-09-09 ENCOUNTER — Telehealth (HOSPITAL_COMMUNITY): Payer: Self-pay

## 2022-09-09 ENCOUNTER — Telehealth: Payer: Self-pay

## 2022-09-09 DIAGNOSIS — K6289 Other specified diseases of anus and rectum: Secondary | ICD-10-CM

## 2022-09-09 MED ORDER — NITROGLYCERIN 0.4 % RE OINT: 1.0000 | TOPICAL_OINTMENT | Freq: Two times a day (BID) | RECTAL | 1 refills | Status: AC

## 2022-09-09 NOTE — Telephone Encounter (Signed)
Resent Nitro rx to [facility] and let patient know through [place].

## 2022-09-09 NOTE — Telephone Encounter (Signed)
Attempted to call patient in regards to Cardiac Rehab - LM on VM 

## 2022-09-16 NOTE — Progress Notes (Signed)
HPI: FU coronary artery disease. The patient underwent cardiac catheterization in December 2005 secondary to a non-ST elevation myocardial infarction. At that time, his ejection fraction was 60%. He had successful PCI of his mid LAD with a drug-eluting stent. Note, there was jailing of 2 small diagonals by the LAD stent. He underwent cardiac catheterization in January of 2011 following MI. This revealed an ejection fraction of 55-60%. The stent in the LAD was patent. The stent jails 2 diagonal branches, both diagonals have 90% ostial stenoses. There was a 25-30% circumflex. The right coronary artery had a 25-30% lesion. There was intense vasospasm and it was felt that his infarct was related to spasm. Abdominal ultrasound 2/17 showed no aneurysm. Patient had repeat catheterization January 2024 due to exertional chest pain.  The stent in his proximal LAD had moderate in-stent restenosis (50) with normal FFR, 90% proximal circumflex lesion treated with drug-eluting stent and LVEDP of 20 mmHg.  Since I last saw him, the patient denies any dyspnea on exertion, orthopnea, PND, pedal edema, palpitations, syncope or chest pain.   Current Outpatient Medications  Medication Sig Dispense Refill   AMBULATORY NON FORMULARY MEDICATION Prosthetic Leg Liners 2 application 0   AMBULATORY NON FORMULARY MEDICATION ResMed CPAP Machine     Aspirin 81 MG EC tablet Take 1 tablet (81 mg total) by mouth daily.     Cholecalciferol (D3 ADULT PO) Take 1 tablet by mouth every evening.     clopidogrel (PLAVIX) 75 MG tablet Take 1 tablet (75 mg total) by mouth daily with breakfast. 30 tablet 3   Cyanocobalamin (B-12 PO) Take 1 tablet by mouth every evening.     diltiazem (TIAZAC) 240 MG 24 hr capsule Take 1 capsule (240 mg total) by mouth daily. 90 capsule 3   irbesartan (AVAPRO) 150 MG tablet Take 0.5 tablets (75 mg total) by mouth daily. 90 tablet 3   isosorbide mononitrate (IMDUR) 60 MG 24 hr tablet TAKE 1 AND 1/2  TABLETS(90 MG) BY MOUTH DAILY (Patient taking differently: Take 90 mg by mouth every evening. TAKE 1 AND 1/2 TABLETS(90 MG) BY MOUTH DAILY) 135 tablet 3   metoprolol tartrate (LOPRESSOR) 25 MG tablet Take 0.5 tablets (12.5 mg total) by mouth 2 (two) times daily. 90 tablet 3   nitroGLYCERIN (NITROSTAT) 0.4 MG SL tablet Place 1 tablet (0.4 mg total) under the tongue every 5 minutes as needed for chest pain. 25 tablet 2   Nitroglycerin 0.4 % OINT Place 1 Application rectally in the morning and at bedtime. Apply 1 inch (375 mg) ointment intra-anally every 12 hours for anal fissure 30 g 1   potassium citrate (UROCIT-K) 10 MEQ (1080 MG) SR tablet Take 2 tablets (20 mEq total) by mouth daily. 180 tablet 3   rosuvastatin (CRESTOR) 40 MG tablet TAKE 1 TABLET(40 MG) BY MOUTH DAILY 90 tablet 3   TURMERIC PO Take 1 capsule by mouth daily.     No current facility-administered medications for this visit.     Past Medical History:  Diagnosis Date   ANXIETY 10/13/2009   Qualifier: Diagnosis of  By: [name], MD, FACC, [place]   pt wears a lt bk prosthesis   Bronchitis, chronic ([place])    CAD (coronary artery disease)    Non-ST segment elevation myocardial infarction in 2005 with drug-eluting stent to the left anterior descending. 08-2009 - non-ST segment elevation myocardial infarction, felt secondary to vasospasm.   Calculus, kidney  hx   Complication of anesthesia    itching after preop med  ?name - 2013, no SOB   Glaucoma    Hyperlipidemia    Hypertension    Myocardial infarction [facility]) 2005   x2, 2005, 2010   OSA (obstructive sleep apnea)    AHI 68/hr in 2002-uses a cpap   Pneumonia    hx of    Sleep apnea    wears CPAP   Wears glasses     Past Surgical History:  Procedure Laterality Date   AMPUT TRAUM LEG BELOW KNEE UNILT W/O COMPL Left    CARDIAC CATHETERIZATION     CORONARY STENT INTERVENTION N/A 08/30/2022   Procedure: CORONARY STENT INTERVENTION;  Surgeon:  Early Osmond, MD;  Location: [place] [facility];  Service: Cardiovascular;  Laterality: N/A;   CYST REMOVAL TRUNK  09/04/2012   Procedure: CYST REMOVAL TRUNK;  Surgeon: Ralene Ok, MD;  Location: [place] [facility];  Service: General;  Laterality: N/A;  EXCISION OF MID BACK MASS   EYE SURGERY Left DR.SON   CATARACT SX1/2019   INCISION AND DRAINAGE ABSCESS Left 07/31/2013   Procedure: INCISIONAL/NON-INCISIONAL DEBRIDEMENT OF ABSCESS LEFT KNEE;  Surgeon: Mauri Pole, MD;  [facility];  Service: Orthopedics;  Laterality: Left;   INTRAVASCULAR IMAGING/OCT N/A 08/30/2022   Procedure: INTRAVASCULAR IMAGING/OCT;  Surgeon: Early Osmond, MD;  Location: [facility];  Service: Cardiovascular;  Laterality: N/A;   INTRAVASCULAR PRESSURE WIRE/FFR STUDY N/A 08/30/2022   Procedure: INTRAVASCULAR PRESSURE WIRE/FFR STUDY;  Surgeon: Early Osmond, MD;  Location: [facility];  Service: Cardiovascular;  Laterality: N/A;   KIDNEY STONE SURGERY     multiple episodes    LEFT HEART CATH AND CORONARY ANGIOGRAPHY N/A 08/30/2022   Procedure: LEFT HEART CATH AND CORONARY ANGIOGRAPHY;  Surgeon: Early Osmond, MD;  Location: [facility];  Service: Cardiovascular;  Laterality: N/A;   Right Knee Arthroscopy  2010   [facility]   TONSILLECTOMY      Social History   Socioeconomic History   Marital status: Married    Spouse name: Not on file   Number of children: 2   Years of education: Not on file   Highest education level: Not on file  Occupational History   Occupation: Programme researcher, broadcasting/film/video-- RETIRED 2012  Tobacco Use   Smoking status: Former    Packs/day: 0.75    Years: 20.00    Total pack years: 15.00    Types: Cigarettes, E-cigarettes   Smokeless tobacco: Never   Tobacco comments:    currently doing some e-cigarettes  Vaping Use   Vaping Use: Every day   Substances: Nicotine  Substance and Sexual Activity   Alcohol use: Yes    Comment: bourbon 3 fingers 3  x/week.   Drug use: No   Sexual activity: Not Currently  Other Topics Concern   Not on file  Social History Narrative   Lives w/ wife    Social Determinants of Health   Financial Resource Strain: Low Risk  (01/09/2022)   Overall Financial Resource Strain (CARDIA)    Difficulty of Paying Living Expenses: Not hard at all  Food Insecurity: No Food Insecurity (01/09/2022)   Hunger Vital Sign    Worried About Running Out of Food in the Last Year: Never true    Ran Out of Food in the Last Year: Never true  Transportation Needs: No Transportation Needs (01/09/2022)   PRAPARE - Transportation    Lack  of Transportation (Medical): No    Lack of Transportation (Non-Medical): No  Physical Activity: Sufficiently Active (01/09/2022)   Exercise Vital Sign    Days of Exercise per Week: 5 days    Minutes of Exercise per Session: 60 min  Stress: No Stress Concern Present (01/09/2022)   [place]    Feeling of Stress : Not at all  Social Connections: [place] (01/09/2022)   Social Connection and Isolation Panel [NHANES]    Frequency of Communication with Friends and Family: Three times a week    Frequency of Social Gatherings with Friends and Family: Three times a week    Attends Religious Services: More than 4 times per year    Active Member of Clubs or Organizations: Yes    Attends Archivist Meetings: More than 4 times per year    Marital Status: Married  Human resources officer Violence: Not At Risk (01/09/2022)   Humiliation, Afraid, Rape, and Kick questionnaire    Fear of Current or Ex-Partner: No    Emotionally Abused: No    Physically Abused: No    Sexually Abused: No    Family History  Problem Relation Age of Onset   Stroke Mother    Cancer Mother        Ovary, Breast   Heart attack Father 56   Heart attack Paternal Grandfather    Colon cancer Neg Hx    Prostate cancer Neg Hx    Diabetes Neg Hx     Esophageal cancer Neg Hx    Rectal cancer Neg Hx    Stomach cancer Neg Hx     ROS: fatigue but no fevers or chills, productive cough, hemoptysis, dysphasia, odynophagia, melena, hematochezia, dysuria, hematuria, rash, seizure activity, orthopnea, PND, pedal edema, claudication. Remaining systems are negative.  Physical Exam: Well-developed well-nourished in no acute distress.  Skin is warm and dry.  HEENT is normal.  Neck is supple.  Chest is clear to auscultation with normal expansion.  Cardiovascular exam is regular rate and rhythm.  Abdominal exam nontender or distended. No masses palpated. Extremities show no edema. neuro grossly intact   A/P  1 coronary artery disease-patient is now status post recent PCI of the circumflex.  Continue aspirin, Plavix and statin.  He also has a history of vasospasm and we will continue with Cardizem and Imdur.  He is complaining of fatigue and I will discontinue metoprolol.  2 hypertension-blood pressure controlled.  Continue present medical regimen.  Note his blood pressure runs high in the afternoons at home.  Not clear that his cuff is accurate.  He will bring that with next office visit and we will correlate with ours.  Check potassium and renal function.  3 hyperlipidemia-continue statin.  Check lipids and liver.  Kirk Ruths, MD

## 2022-09-23 ENCOUNTER — Encounter: Payer: Self-pay | Admitting: Cardiology

## 2022-09-24 MED ORDER — CLOPIDOGREL BISULFATE 75 MG PO TABS: 75.0000 mg | ORAL_TABLET | Freq: Every day | ORAL | 3 refills | Status: DC

## 2022-09-30 ENCOUNTER — Encounter: Payer: Self-pay | Admitting: Cardiology

## 2022-09-30 ENCOUNTER — Ambulatory Visit: Payer: PPO | Attending: Cardiology | Admitting: Cardiology

## 2022-09-30 VITALS — BP 122/70 | HR 55 | Ht 65.0 in | Wt 190.0 lb

## 2022-09-30 DIAGNOSIS — I25118 Atherosclerotic heart disease of native coronary artery with other forms of angina pectoris: Secondary | ICD-10-CM

## 2022-09-30 DIAGNOSIS — I10 Essential (primary) hypertension: Secondary | ICD-10-CM | POA: Diagnosis not present

## 2022-09-30 DIAGNOSIS — E785 Hyperlipidemia, unspecified: Secondary | ICD-10-CM | POA: Diagnosis not present

## 2022-09-30 NOTE — Patient Instructions (Signed)
Medication Instructions:   STOP METOPROLOL  *If you need a refill on your cardiac medications before your next appointment, please call your pharmacy*   Lab Work:  Your physician recommends that you return for lab work FASTING  If you have labs (blood work) drawn today and your tests are completely normal, you will receive your results only by: [place] (if you have MyChart) OR A paper copy in the mail If you have any lab test that is abnormal or we need to change your treatment, we will call you to review the results.    Follow-Up: At [facility], you and your health needs are our priority.  As part of our continuing mission to provide you with exceptional heart care, we have created designated Provider Care Teams.  These Care Teams include your primary Cardiologist (physician) and Advanced Practice Providers (APPs -  Physician Assistants and Nurse Practitioners) who all work together to provide you with the care you need, when you need it.  We recommend signing up for the patient portal called "MyChart".  Sign up information is provided on this After Visit Summary.  MyChart is used to connect with patients for Virtual Visits (Telemedicine).  Patients are able to view lab/test results, encounter notes, upcoming appointments, etc.  Non-urgent messages can be sent to your provider as well.   To learn more about what you can do with MyChart, go to [email or web address].    Your next appointment:   6 month(s)  Provider:   Kirk Ruths, MD

## 2022-10-01 ENCOUNTER — Encounter: Payer: Self-pay | Admitting: Cardiology

## 2022-10-01 DIAGNOSIS — E785 Hyperlipidemia, unspecified: Secondary | ICD-10-CM | POA: Diagnosis not present

## 2022-10-01 DIAGNOSIS — I10 Essential (primary) hypertension: Secondary | ICD-10-CM | POA: Diagnosis not present

## 2022-10-01 LAB — LIPID PANEL
Chol/HDL Ratio: 1.8 ratio (ref 0.0–5.0)
Cholesterol, Total: 113 mg/dL (ref 100–199)
HDL: 64 mg/dL (ref >39)
LDL Chol Calc (NIH): 37 mg/dL (ref 0–99)
Triglycerides: 50 mg/dL (ref 0–149)
VLDL Cholesterol Cal: 12 mg/dL (ref 5–40)

## 2022-10-01 LAB — COMPREHENSIVE METABOLIC PANEL
ALT: 18 IU/L (ref 0–44)
AST: 17 IU/L (ref 0–40)
Albumin/Globulin Ratio: 2.5 — ABNORMAL HIGH (ref 1.2–2.2)
Albumin: 4.5 g/dL (ref 3.8–4.8)
Alkaline Phosphatase: 77 IU/L (ref 44–121)
BUN/Creatinine Ratio: 24 (ref 10–24)
BUN: 20 mg/dL (ref 8–27)
Bilirubin Total: 0.6 mg/dL (ref 0.0–1.2)
CO2: 24 mmol/L (ref 20–29)
Calcium: 9.2 mg/dL (ref 8.6–10.2)
Chloride: 104 mmol/L (ref 96–106)
Creatinine, Ser: 0.84 mg/dL (ref 0.76–1.27)
Globulin, Total: 1.8 g/dL (ref 1.5–4.5)
Glucose: 101 mg/dL — ABNORMAL HIGH (ref 70–99)
Potassium: 4.8 mmol/L (ref 3.5–5.2)
Sodium: 140 mmol/L (ref 134–144)
Total Protein: 6.3 g/dL (ref 6.0–8.5)
eGFR: 92 mL/min/{1.73_m2} (ref >59)

## 2022-10-02 ENCOUNTER — Encounter: Payer: Self-pay | Admitting: Cardiology

## 2022-10-08 DIAGNOSIS — G4733 Obstructive sleep apnea (adult) (pediatric): Secondary | ICD-10-CM | POA: Diagnosis not present

## 2022-10-14 ENCOUNTER — Encounter: Payer: Self-pay | Admitting: Family Medicine

## 2022-10-14 ENCOUNTER — Encounter: Payer: Self-pay | Admitting: Cardiology

## 2022-10-14 ENCOUNTER — Ambulatory Visit (INDEPENDENT_AMBULATORY_CARE_PROVIDER_SITE_OTHER): Payer: PPO | Admitting: Family Medicine

## 2022-10-14 VITALS — BP 120/70 | HR 55 | Temp 98.3°F | Ht 65.0 in | Wt 193.8 lb

## 2022-10-14 DIAGNOSIS — Z87442 Personal history of urinary calculi: Secondary | ICD-10-CM | POA: Diagnosis not present

## 2022-10-14 DIAGNOSIS — I10 Essential (primary) hypertension: Secondary | ICD-10-CM | POA: Diagnosis not present

## 2022-10-14 DIAGNOSIS — I25119 Atherosclerotic heart disease of native coronary artery with unspecified angina pectoris: Secondary | ICD-10-CM

## 2022-10-14 DIAGNOSIS — E782 Mixed hyperlipidemia: Secondary | ICD-10-CM

## 2022-10-14 MED ORDER — POTASSIUM CITRATE ER 10 MEQ (1080 MG) PO TBCR: 20.0000 meq | EXTENDED_RELEASE_TABLET | Freq: Every day | ORAL | 3 refills | Status: DC

## 2022-10-14 NOTE — Assessment & Plan Note (Signed)
Lipids at goal. Continue rosuvastatin 40 mg daily.

## 2022-10-14 NOTE — Progress Notes (Signed)
[facility] [address] [place] [address] Dept: [phone] Dept Fax: [phone]  Chronic Care Office Visit  Subjective:    Patient ID: Devin Martin, male    DOB: 1946-07-05, 77 y.o..   MRN: [id number]  Chief Complaint  Patient presents with   Medical Management of Chronic Issues    3 month f/u.  Not fasting today.  No concerns.    History of Present Illness:  Patient is in today for reassessment of chronic medical issues.  Mr. Weakley has a history of hypertension. He had been having issues with increased blood pressures in December and January. He had been managed on diltiazem 240 mg daily . Dr. [name] had added irbesartan 150 mg daily to his regimen. Despite the addition, he was still seeing elevated values at home. Mr. Herdt has a history of a prior MI with stent placement and hyperlipidemia. He was also taking aspirin 81 mg daily, isosorbide 60 mg 1 1/2 tabs daily, and rosuvastatin 40 mg daily. He underwent a cardiac cath on 08/30/2022. This showed a proximal circumflex artery lesion that was 90% stenosed. He also had a  proximal LAD to mid LAD lesion 50% stenosed, and a LAD ostium to proximal LAD lesion 25% stenosed.  A stent was successfully placed and post intervention, there was a 0% residual stenosis. Mr. Desanctis notes that he feels much better now. He had been having some chest tightness at times near the end of his workouts and occasional dyspnea. Both are now resolved. He is now on clopidogrel 75 mg daily, which will continue for 1 year.  Past Medical History: Patient Active Problem List   Diagnosis Date Noted   Angina pectoris ([place]) 08/30/2022   Obesity (BMI 30.0-34.9) 10/19/2021   Prediabetes 10/19/2021   Remote history of stroke 06/03/2019   Cholecystitis 01/16/2017   S/P BKA (below knee amputation) ([place]) 05/06/2016   Osteoarthritis of right knee 12/31/2013   Hypogonadism male 08/12/2011   GERD  04/05/2010   Essential hypertension 01/26/2009   Hyperlipidemia 06/23/2007   Obstructive sleep apnea 06/23/2007   Coronary artery disease involving native coronary artery of native heart with angina pectoris ([place]) 06/23/2007   History of kidney stones 06/17/2007   Past Surgical History:  Procedure Laterality Date   AMPUT TRAUM LEG BELOW KNEE UNILT W/O COMPL Left    CARDIAC CATHETERIZATION     CORONARY STENT INTERVENTION N/A 08/30/2022   Procedure: CORONARY STENT INTERVENTION;  Surgeon: Early Osmond, MD;  Location: [facility];  Service: Cardiovascular;  Laterality: N/A;   CYST REMOVAL TRUNK  09/04/2012   Procedure: CYST REMOVAL TRUNK;  Surgeon: Ralene Ok, MD;  Location: [facility];  Service: General;  Laterality: N/A;  EXCISION OF MID BACK MASS   EYE SURGERY Left DR.SON   CATARACT SX1/2019   INCISION AND DRAINAGE ABSCESS Left 07/31/2013   Procedure: INCISIONAL/NON-INCISIONAL DEBRIDEMENT OF ABSCESS LEFT KNEE;  Surgeon: Mauri Pole, MD;  Location: [facility];  Service: Orthopedics;  Laterality: Left;   INTRAVASCULAR IMAGING/OCT N/A 08/30/2022   Procedure: INTRAVASCULAR IMAGING/OCT;  Surgeon: Early Osmond, MD;  Location: [facility];  Service: Cardiovascular;  Laterality: N/A;   INTRAVASCULAR PRESSURE WIRE/FFR STUDY N/A 08/30/2022   Procedure: INTRAVASCULAR PRESSURE WIRE/FFR STUDY;  Surgeon: Early Osmond, MD;  Location: [facility];  Service: Cardiovascular;  Laterality: N/A;   KIDNEY STONE SURGERY     multiple episodes    LEFT HEART CATH  AND CORONARY ANGIOGRAPHY N/A 08/30/2022   Procedure: LEFT HEART CATH AND CORONARY ANGIOGRAPHY;  Surgeon: Early Osmond, MD;  Location: [facility];  Service: Cardiovascular;  Laterality: N/A;   Right Knee Arthroscopy  2010   [facility]   TONSILLECTOMY     Family History  Problem Relation Age of Onset   Stroke Mother    Cancer Mother        Ovary, Breast   Heart attack Father 84   Heart attack  Paternal Grandfather    Colon cancer Neg Hx    Prostate cancer Neg Hx    Diabetes Neg Hx    Esophageal cancer Neg Hx    Rectal cancer Neg Hx    Stomach cancer Neg Hx    Outpatient Medications Prior to Visit  Medication Sig Dispense Refill   AMBULATORY NON FORMULARY MEDICATION Prosthetic Leg Liners 2 application 0   AMBULATORY NON FORMULARY MEDICATION ResMed CPAP Machine     Aspirin 81 MG EC tablet Take 1 tablet (81 mg total) by mouth daily.     Cholecalciferol (D3 ADULT PO) Take 1 tablet by mouth every evening.     clopidogrel (PLAVIX) 75 MG tablet Take 1 tablet (75 mg total) by mouth daily with breakfast. 30 tablet 3   Cyanocobalamin (B-12 PO) Take 1 tablet by mouth every evening.     diltiazem (TIAZAC) 240 MG 24 hr capsule Take 1 capsule (240 mg total) by mouth daily. 90 capsule 3   irbesartan (AVAPRO) 150 MG tablet Take 0.5 tablets (75 mg total) by mouth daily. 90 tablet 3   isosorbide mononitrate (IMDUR) 60 MG 24 hr tablet TAKE 1 AND 1/2 TABLETS(90 MG) BY MOUTH DAILY (Patient taking differently: Take 90 mg by mouth every evening. TAKE 1 AND 1/2 TABLETS(90 MG) BY MOUTH DAILY) 135 tablet 3   nitroGLYCERIN (NITROSTAT) 0.4 MG SL tablet Place 1 tablet (0.4 mg total) under the tongue every 5 minutes as needed for chest pain. 25 tablet 2   Nitroglycerin 0.4 % OINT Place 1 Application rectally in the morning and at bedtime. Apply 1 inch (375 mg) ointment intra-anally every 12 hours for anal fissure 30 g 1   rosuvastatin (CRESTOR) 40 MG tablet TAKE 1 TABLET(40 MG) BY MOUTH DAILY 90 tablet 3   TURMERIC PO Take 1 capsule by mouth daily.     potassium citrate (UROCIT-K) 10 MEQ (1080 MG) SR tablet Take 2 tablets (20 mEq total) by mouth daily. 180 tablet 3   No facility-administered medications prior to visit.   Allergies  Allergen Reactions   Penicillins Hives and Other (See Comments)    syncope   Tylox [Oxycodone-Acetaminophen] Other (See Comments)    "messes me up"   Codeine Nausea Only    Objective:   Today's Vitals   10/14/22 0836  BP: 120/70  Pulse: (!) 55  Temp: 98.3 F (36.8 C)  TempSrc: Temporal  SpO2: 95%  Weight: 193 lb 12.8 oz (87.9 kg)  Height: 5' 5"$  (1.651 m)   Body mass index is 32.25 kg/m.   General: Well developed, well nourished. No acute distress. Psych: Alert and oriented. Normal mood and affect.  There are no preventive care reminders to display for this patient.    Lab Results: Lab Results  Component Value Date   CHOL 113 10/01/2022   HDL 64 10/01/2022   LDLCALC 37 10/01/2022   LDLDIRECT 150.1 06/24/2007   TRIG 50 10/01/2022   CHOLHDL 1.8 10/01/2022    Assessment &  Plan:   Problem List Items Addressed This Visit       Cardiovascular and Mediastinum   Essential hypertension - Primary    Blood pressure is in good control. Continue diltiazem 240 mg daily and irbesartan 50 mg daily. We discussed that he should continue the current medication. I would not advise trying to stop any of this therapy at this point.      Coronary artery disease involving native coronary artery of native heart with angina pectoris ([place])    Recent stent placement. Feeling much improved. Continue aspirin 81 mg daily, isosorbide 60 mg 1 1/2 tabs daily, and clopidogrel 75 mg daily.        Other   Hyperlipidemia    Lipids at goal. Continue rosuvastatin 40 mg daily.       History of kidney stones    I will renew his potassium citrate.      Relevant Medications   potassium citrate (UROCIT-K) 10 MEQ (1080 MG) SR tablet    Return in about 3 months (around 01/12/2023) for Reassessment.   Haydee Salter, MD

## 2022-10-14 NOTE — Assessment & Plan Note (Signed)
Recent stent placement. Feeling much improved. Continue aspirin 81 mg daily, isosorbide 60 mg 1 1/2 tabs daily, and clopidogrel 75 mg daily.

## 2022-10-14 NOTE — Assessment & Plan Note (Signed)
I will renew his potassium citrate.

## 2022-10-14 NOTE — Assessment & Plan Note (Signed)
Blood pressure is in good control. Continue diltiazem 240 mg daily and irbesartan 50 mg daily. We discussed that he should continue the current medication. I would not advise trying to stop any of this therapy at this point.

## 2022-10-16 ENCOUNTER — Other Ambulatory Visit: Payer: Self-pay

## 2022-10-16 MED ORDER — ISOSORBIDE MONONITRATE ER 60 MG PO TB24: 90.0000 mg | ORAL_TABLET | Freq: Every evening | ORAL | 1 refills | Status: DC

## 2022-10-28 ENCOUNTER — Telehealth (HOSPITAL_COMMUNITY): Payer: Self-pay

## 2022-10-28 NOTE — Telephone Encounter (Signed)
Called and spoke with pt in regards to CR, pt stated he is not interested at this time.   Closed referral 

## 2022-10-31 ENCOUNTER — Encounter: Payer: Self-pay | Admitting: Family Medicine

## 2022-11-04 DIAGNOSIS — G4733 Obstructive sleep apnea (adult) (pediatric): Secondary | ICD-10-CM | POA: Diagnosis not present

## 2022-11-06 DIAGNOSIS — G4733 Obstructive sleep apnea (adult) (pediatric): Secondary | ICD-10-CM | POA: Diagnosis not present

## 2022-11-07 ENCOUNTER — Encounter: Payer: Self-pay | Admitting: Cardiology

## 2022-11-18 ENCOUNTER — Encounter: Payer: Self-pay | Admitting: Cardiology

## 2022-11-18 DIAGNOSIS — I10 Essential (primary) hypertension: Secondary | ICD-10-CM

## 2022-11-18 MED ORDER — CHLORTHALIDONE 25 MG PO TABS: 12.5000 mg | ORAL_TABLET | Freq: Every day | ORAL | 1 refills | Status: DC

## 2022-11-21 ENCOUNTER — Encounter: Payer: Self-pay | Admitting: Nurse Practitioner

## 2022-11-21 ENCOUNTER — Ambulatory Visit (INDEPENDENT_AMBULATORY_CARE_PROVIDER_SITE_OTHER): Payer: PPO | Admitting: Nurse Practitioner

## 2022-11-21 VITALS — BP 122/64 | HR 67 | Temp 98.0°F | Ht 65.0 in | Wt 191.6 lb

## 2022-11-21 DIAGNOSIS — J40 Bronchitis, not specified as acute or chronic: Secondary | ICD-10-CM | POA: Diagnosis not present

## 2022-11-21 LAB — POC COVID19 BINAXNOW: SARS Coronavirus 2 Ag: NEGATIVE

## 2022-11-21 LAB — POCT INFLUENZA A/B
Influenza A, POC: NEGATIVE
Influenza B, POC: NEGATIVE

## 2022-11-21 MED ORDER — BENZONATATE 100 MG PO CAPS: 100.0000 mg | ORAL_CAPSULE | Freq: Three times a day (TID) | ORAL | 0 refills | Status: DC | PRN

## 2022-11-21 NOTE — Patient Instructions (Signed)
It was great to see you!  Start claritin 10mg  daily  Drink plenty of fluids  Start tessalon 3 times a day as needed for cough.   Let's follow-up if your symptoms worsen or don't improve.   Take care,  Vance Peper, NP

## 2022-11-21 NOTE — Progress Notes (Signed)
   Acute Office Visit  Subjective:     Patient ID: Devin Martin, male    DOB: Feb 18, 1946, 77 y.o.   MRN: [id number]  Chief Complaint  Patient presents with   Cough    With Headache for 2 days    HPI Patient is in today for cough with headache for 2 days. This morning he started coughing up yellow phlegm. He denies fever, ear pain, sore throat, shortness of breath, and nasal congestion. He has not tried anything to help over the counter. He denies sick contacts.   ROS See pertinent positives and negatives per HPI.     Objective:    BP 122/64 (BP Location: Right Arm)   Pulse 67   Temp 98 F (36.7 C)   Ht 5\' 5"  (1.651 m)   Wt 191 lb 9.6 oz (86.9 kg)   SpO2 97%   BMI 31.88 kg/m    Physical Exam Vitals and nursing note reviewed.  Constitutional:      Appearance: Normal appearance.  HENT:     Head: Normocephalic.     Right Ear: Tympanic membrane, ear canal and external ear normal.     Left Ear: Tympanic membrane, ear canal and external ear normal.  Eyes:     Conjunctiva/sclera: Conjunctivae normal.  Cardiovascular:     Rate and Rhythm: Normal rate and regular rhythm.     Pulses: Normal pulses.     Heart sounds: Normal heart sounds.  Pulmonary:     Effort: Pulmonary effort is normal.     Breath sounds: Normal breath sounds.  Musculoskeletal:     Cervical back: Normal range of motion. No tenderness.  Lymphadenopathy:     Cervical: No cervical adenopathy.  Skin:    General: Skin is warm.  Neurological:     General: No focal deficit present.     Mental Status: He is alert and oriented to person, place, and time.  Psychiatric:        Mood and Affect: Mood normal.        Behavior: Behavior normal.        Thought Content: Thought content normal.        Judgment: Judgment normal.     Results for orders placed or performed in visit on 11/21/22  POC COVID-19 BinaxNow  Result Value Ref Range   SARS Coronavirus 2 Ag Negative Negative  POCT Influenza A/B  Result  Value Ref Range   Influenza A, POC Negative Negative   Influenza B, POC Negative Negative        Assessment & Plan:   Problem List Items Addressed This Visit   None Visit Diagnoses     Bronchitis    -  Primary   POC Covid and flu negative. Will treat symptoms. Tessalon TID prn cough. Encourage fluids, rest. Discussed this is likely viral and ABX not needed   Relevant Orders   POC COVID-19 BinaxNow (Completed)   POCT Influenza A/B (Completed)       Meds ordered this encounter  Medications   benzonatate (TESSALON) 100 MG capsule    Sig: Take 1 capsule (100 mg total) by mouth 3 (three) times daily as needed for cough.    Dispense:  30 capsule    Refill:  0    Return if symptoms worsen or fail to improve.  Charyl Dancer, NP

## 2022-11-22 ENCOUNTER — Encounter: Payer: Self-pay | Admitting: Nurse Practitioner

## 2022-12-04 ENCOUNTER — Encounter: Payer: Self-pay | Admitting: Cardiology

## 2022-12-04 MED ORDER — CHLORTHALIDONE 25 MG PO TABS: 12.5000 mg | ORAL_TABLET | Freq: Every day | ORAL | 6 refills | Status: DC

## 2022-12-06 ENCOUNTER — Telehealth: Payer: Self-pay | Admitting: Family Medicine

## 2022-12-06 NOTE — Telephone Encounter (Signed)
Contacted Devin Martin to schedule their annual wellness visit. Appointment made for 12/13/22.  [name] [facility] AW[facility] direct phone # [phone]

## 2022-12-07 DIAGNOSIS — G4733 Obstructive sleep apnea (adult) (pediatric): Secondary | ICD-10-CM | POA: Diagnosis not present

## 2022-12-09 ENCOUNTER — Encounter: Payer: Self-pay | Admitting: Cardiology

## 2022-12-13 ENCOUNTER — Ambulatory Visit (INDEPENDENT_AMBULATORY_CARE_PROVIDER_SITE_OTHER): Payer: PPO

## 2022-12-13 VITALS — BP 108/52 | HR 68 | Temp 97.8°F | Ht 65.0 in | Wt 190.6 lb

## 2022-12-13 DIAGNOSIS — Z0000 Encounter for general adult medical examination without abnormal findings: Secondary | ICD-10-CM

## 2022-12-13 NOTE — Patient Instructions (Signed)
[name] , Thank you for taking time to come for your Medicare Wellness Visit. I appreciate your ongoing commitment to your health goals. Please review the following plan we discussed and let me know if I can assist you in the future.   These are the goals we discussed:  Goals       A1c goal <6.5%      -Pre-Diabetes a1c range is 5.7% to 6.4%. Your most recent a1c was 6.2% on 10/12/19.  -Usually the full diabetes diagnosis is given when a1c reaches 6.5% or higher.       Blood pressure goal less than 140/90 and greater than 90/60      Chronic Care Management Pharmacy Care Plan      CARE PLAN ENTRY (see longitudinal plan of care for additional care plan information)  Current Barriers:  Chronic Disease Management support, education, and care coordination needs related to Pre-DM, HTN, HLD, CAD, GERD, Vitamin D Deficiency, Vitamin B12 Deficiency, OSA   Hypertension BP Readings from Last 3 Encounters:  04/12/20 132/62  10/12/19 (!) 122/53  04/09/19 110/60  Pharmacist Clinical Goal(s): Over the next 180 days, patient will work with PharmD and providers to maintain BP goal <140/90 Current regimen:  Diltiazem 240mg  daily Patient self care activities - Over the next 180 days, patient will: Maintain hypertension medication regimen.   Hyperlipidemia Lab Results  Component Value Date/Time   LDLCALC 51 10/12/2019 10:33 AM   LDLDIRECT 150.1 06/24/2007 09:00 AM  Pharmacist Clinical Goal(s): Over the next 180 days, patient will work with PharmD and providers to maintain LDL goal < 70 Current regimen:  Rosuvastatin 40mg  once daily Patient self care activities - Over the next 180 days, patient will: Maintain cholesterol medication regimen.   Pre-Diabetes Lab Results  Component Value Date/Time   HGBA1C 6.2 10/12/2019 10:33 AM   HGBA1C 5.6 10/09/2018 09:38 AM  Pharmacist Clinical Goal(s): Over the next 180 days, patient will work with PharmD and providers to maintain A1c goal  <6.5% Current regimen:  Diet and exercise management   Interventions: Discussed to replace apple juice with reduced carb orange juice Patient self care activities - Over the next 180 days, patient will: Maintain a1c <6.5%  Medication management Pharmacist Clinical Goal(s): Over the next 180 days, patient will work with PharmD and providers to maintain optimal medication adherence Current pharmacy: [facility] Interventions Comprehensive medication review performed. Continue current medication management strategy Patient self care activities - Over the next 180 days, patient will: Focus on medication adherence by filling and taking medications appropriately  Take medications as prescribed Report any questions or concerns to PharmD and/or provider(s)  Please see past updates related to this goal by clicking on the "Past Updates" button in the selected goal        Increase vitamin D to 2000 units daily      LDL goal <70      Maintain healthy active lifestyle. (pt-stated)      Patient Stated      12/13/2022, wants to get off some medications and elevate hip pain      Replace apple juice with reduced carb orange juice      Start taking omeprazole every other day with goal to taper off of medication      Weight (lb) < 190 lb (86.2 kg)        This is a list of the screening recommended for you and due dates:  Health Maintenance  Topic Date Due   Zoster (Shingles) Vaccine (  1 of 2) 03/14/2023*   Flu Shot  03/27/2023   Medicare Annual Wellness Visit  12/13/2023   Colon Cancer Screening  09/08/2024   DTaP/Tdap/Td vaccine (3 - Tdap) 09/27/2027   Pneumonia Vaccine  Completed   Hepatitis C Screening: USPSTF Recommendation to screen - Ages 74-79 yo.  Completed   HPV Vaccine  Aged Out   COVID-19 Vaccine  Discontinued   Meningitis B Vaccine  Discontinued  *Topic was postponed. The date shown is not the original due date.    Advanced directives: copy in chart  Conditions/risks  identified: none  Next appointment: Follow up in one year for your annual wellness visit.   Preventive Care 11 Years and Older, Male  Preventive care refers to lifestyle choices and visits with your health care provider that can promote health and wellness. What does preventive care include? A yearly physical exam. This is also called an annual well check. Dental exams once or twice a year. Routine eye exams. Ask your health care provider how often you should have your eyes checked. Personal lifestyle choices, including: Daily care of your teeth and gums. Regular physical activity. Eating a healthy diet. Avoiding tobacco and drug use. Limiting alcohol use. Practicing safe sex. Taking low doses of aspirin every day. Taking vitamin and mineral supplements as recommended by your health care provider. What happens during an annual well check? The services and screenings done by your health care provider during your annual well check will depend on your age, overall health, lifestyle risk factors, and family history of disease. Counseling  Your health care provider may ask you questions about your: Alcohol use. Tobacco use. Drug use. Emotional well-being. Home and relationship well-being. Sexual activity. Eating habits. History of falls. Memory and ability to understand (cognition). Work and work Astronomer. Screening  You may have the following tests or measurements: Height, weight, and BMI. Blood pressure. Lipid and cholesterol levels. These may be checked every 5 years, or more frequently if you are over 67 years old. Skin check. Lung cancer screening. You may have this screening every year starting at age 17 if you have a 30-pack-year history of smoking and currently smoke or have quit within the past 15 years. Fecal occult blood test (FOBT) of the stool. You may have this test every year starting at age 28. Flexible sigmoidoscopy or colonoscopy. You may have a sigmoidoscopy  every 5 years or a colonoscopy every 10 years starting at age 61. Prostate cancer screening. Recommendations will vary depending on your family history and other risks. Hepatitis C blood test. Hepatitis B blood test. Sexually transmitted disease (STD) testing. Diabetes screening. This is done by checking your blood sugar (glucose) after you have not eaten for a while (fasting). You may have this done every 1-3 years. Abdominal aortic aneurysm (AAA) screening. You may need this if you are a current or former smoker. Osteoporosis. You may be screened starting at age 57 if you are at high risk. Talk with your health care provider about your test results, treatment options, and if necessary, the need for more tests. Vaccines  Your health care provider may recommend certain vaccines, such as: Influenza vaccine. This is recommended every year. Tetanus, diphtheria, and acellular pertussis (Tdap, Td) vaccine. You may need a Td booster every 10 years. Zoster vaccine. You may need this after age 69. Pneumococcal 13-valent conjugate (PCV13) vaccine. One dose is recommended after age 60. Pneumococcal polysaccharide (PPSV23) vaccine. One dose is recommended after age 51. Talk to your  health care provider about which screenings and vaccines you need and how often you need them. This information is not intended to replace advice given to you by your health care provider. Make sure you discuss any questions you have with your health care provider. Document Released: 09/08/2015 Document Revised: 05/01/2016 Document Reviewed: 06/13/2015 Elsevier Interactive Patient Education  2017 [employer].  Fall Prevention in the Home Falls can cause injuries. They can happen to people of all ages. There are many things you can do to make your home safe and to help prevent falls. What can I do on the outside of my home? Regularly fix the edges of walkways and driveways and fix any cracks. Remove anything that might make  you trip as you walk through a door, such as a raised step or threshold. Trim any bushes or trees on the path to your home. Use bright outdoor lighting. Clear any walking paths of anything that might make someone trip, such as rocks or tools. Regularly check to see if handrails are loose or broken. Make sure that both sides of any steps have handrails. Any raised decks and porches should have guardrails on the edges. Have any leaves, snow, or ice cleared regularly. Use sand or salt on walking paths during winter. Clean up any spills in your garage right away. This includes oil or grease spills. What can I do in the bathroom? Use night lights. Install grab bars by the toilet and in the tub and shower. Do not use towel bars as grab bars. Use non-skid mats or decals in the tub or shower. If you need to sit down in the shower, use a plastic, non-slip stool. Keep the floor dry. Clean up any water that spills on the floor as soon as it happens. Remove soap buildup in the tub or shower regularly. Attach bath mats securely with double-sided non-slip rug tape. Do not have throw rugs and other things on the floor that can make you trip. What can I do in the bedroom? Use night lights. Make sure that you have a light by your bed that is easy to reach. Do not use any sheets or blankets that are too big for your bed. They should not hang down onto the floor. Have a firm chair that has side arms. You can use this for support while you get dressed. Do not have throw rugs and other things on the floor that can make you trip. What can I do in the kitchen? Clean up any spills right away. Avoid walking on wet floors. Keep items that you use a lot in easy-to-reach places. If you need to reach something above you, use a strong step stool that has a grab bar. Keep electrical cords out of the way. Do not use floor polish or wax that makes floors slippery. If you must use wax, use non-skid floor wax. Do not  have throw rugs and other things on the floor that can make you trip. What can I do with my stairs? Do not leave any items on the stairs. Make sure that there are handrails on both sides of the stairs and use them. Fix handrails that are broken or loose. Make sure that handrails are as long as the stairways. Check any carpeting to make sure that it is firmly attached to the stairs. Fix any carpet that is loose or worn. Avoid having throw rugs at the top or bottom of the stairs. If you do have throw rugs, attach them  to the floor with carpet tape. Make sure that you have a light switch at the top of the stairs and the bottom of the stairs. If you do not have them, ask someone to add them for you. What else can I do to help prevent falls? Wear shoes that: Do not have high heels. Have rubber bottoms. Are comfortable and fit you well. Are closed at the toe. Do not wear sandals. If you use a stepladder: Make sure that it is fully opened. Do not climb a closed stepladder. Make sure that both sides of the stepladder are locked into place. Ask someone to hold it for you, if possible. Clearly mark and make sure that you can see: Any grab bars or handrails. First and last steps. Where the edge of each step is. Use tools that help you move around (mobility aids) if they are needed. These include: Canes. Walkers. Scooters. Crutches. Turn on the lights when you go into a dark area. Replace any light bulbs as soon as they burn out. Set up your furniture so you have a clear path. Avoid moving your furniture around. If any of your floors are uneven, fix them. If there are any pets around you, be aware of where they are. Review your medicines with your doctor. Some medicines can make you feel dizzy. This can increase your chance of falling. Ask your doctor what other things that you can do to help prevent falls. This information is not intended to replace advice given to you by your health care  provider. Make sure you discuss any questions you have with your health care provider. Document Released: 06/08/2009 Document Revised: 01/18/2016 Document Reviewed: 09/16/2014 Elsevier Interactive Patient Education  2017 [employer].

## 2022-12-13 NOTE — Progress Notes (Signed)
Subjective:   Devin Martin is a 77 y.o. male who presents for Medicare Annual/Subsequent preventive examination.  Review of Systems     Cardiac Risk Factors include: advanced age (>91men, >72 women);dyslipidemia;hypertension;male gender;obesity (BMI >30kg/m2)     Objective:    Today's Vitals   12/13/22 0842  BP: (!) 108/52  Pulse: 68  Temp: 97.8 F (36.6 C)  TempSrc: Oral  SpO2: 97%  Weight: 190 lb 9.6 oz (86.5 kg)  Height: 5\' 5"  (1.651 m)   Body mass index is 31.72 kg/m.     12/13/2022    8:53 AM 08/30/2022    7:50 AM 01/09/2022    8:56 AM 10/19/2020    9:43 AM 10/12/2019   12:07 PM 10/09/2018    8:21 AM 07/19/2018    8:22 AM  Advanced Directives  Does Patient Have a Medical Advance Directive? Yes Yes Yes Yes Yes Yes No  Type of Estate agent of [place];Living will Healthcare Power of [place];Living will Healthcare Power of [place];Living will Healthcare Power of [place];Living will Healthcare Power of [place];Living will Healthcare Power of [place];Living will   Does patient want to make changes to medical advance directive?  No - Patient declined   No - Patient declined No - Patient declined   Copy of Healthcare Power of Attorney in Chart? Yes - validated most recent copy scanned in chart (See row information)  No - copy requested No - copy requested No - copy requested Yes - validated most recent copy scanned in chart (See row information)     Current Medications (verified) Outpatient Encounter Medications as of 12/13/2022  Medication Sig   AMBULATORY NON FORMULARY MEDICATION Prosthetic Leg Liners   AMBULATORY NON FORMULARY MEDICATION ResMed CPAP Machine   Aspirin 81 MG EC tablet Take 1 tablet (81 mg total) by mouth daily.   benzonatate (TESSALON) 100 MG capsule Take 1 capsule (100 mg total) by mouth 3 (three) times daily as needed for cough.   chlorthalidone (HYGROTON) 25 MG tablet Take 0.5 tablets (12.5 mg total) by mouth daily.    Cholecalciferol (D3 ADULT PO) Take 1 tablet by mouth every evening.   clopidogrel (PLAVIX) 75 MG tablet Take 1 tablet (75 mg total) by mouth daily with breakfast.   Cyanocobalamin (B-12 PO) Take 1 tablet by mouth every evening.   diltiazem (TIAZAC) 240 MG 24 hr capsule Take 1 capsule (240 mg total) by mouth daily.   irbesartan (AVAPRO) 150 MG tablet Take 0.5 tablets (75 mg total) by mouth daily.   isosorbide mononitrate (IMDUR) 60 MG 24 hr tablet Take 1.5 tablets (90 mg total) by mouth every evening. TAKE 1 AND 1/2 TABLETS(90 MG) BY MOUTH DAILY   nitroGLYCERIN (NITROSTAT) 0.4 MG SL tablet Place 1 tablet (0.4 mg total) under the tongue every 5 minutes as needed for chest pain.   Nitroglycerin 0.4 % OINT Place 1 Application rectally in the morning and at bedtime. Apply 1 inch (375 mg) ointment intra-anally every 12 hours for anal fissure   potassium citrate (UROCIT-K) 10 MEQ (1080 MG) SR tablet Take 2 tablets (20 mEq total) by mouth daily.   rosuvastatin (CRESTOR) 40 MG tablet TAKE 1 TABLET(40 MG) BY MOUTH DAILY   TURMERIC PO Take 1 capsule by mouth daily.   No facility-administered encounter medications on file as of 12/13/2022.    Allergies (verified) Penicillins, Tylox [oxycodone-acetaminophen], and Codeine   History: Past Medical History:  Diagnosis Date   ANXIETY 10/13/2009   Qualifier: Diagnosis of  ByJens Som, MD, Lyn Hollingshead    Arthritis 1968   pt wears a lt bk prosthesis   Bronchitis, chronic    CAD (coronary artery disease)    Non-ST segment elevation myocardial infarction in 2005 with drug-eluting stent to the left anterior descending. 08-2009 - non-ST segment elevation myocardial infarction, felt secondary to vasospasm.   Calculus, kidney    hx   Complication of anesthesia    itching after preop med  ?name - 2013, no SOB   Glaucoma    Hyperlipidemia    Hypertension    Myocardial infarction 2005   x2, 2005, 2010   OSA (obstructive sleep apnea)    AHI 68/hr in  2002-uses a cpap   Pneumonia    hx of    Sleep apnea    wears CPAP   Wears glasses    Past Surgical History:  Procedure Laterality Date   AMPUT TRAUM LEG BELOW KNEE UNILT W/O COMPL Left    CARDIAC CATHETERIZATION     CORONARY IMAGING/OCT N/A 08/30/2022   Procedure: INTRAVASCULAR IMAGING/OCT;  Surgeon: Orbie Pyo, MD;  [facility]: [facility];  Service: Cardiovascular;  Laterality: N/A;   CORONARY PRESSURE/FFR STUDY N/A 08/30/2022   Procedure: INTRAVASCULAR PRESSURE WIRE/FFR STUDY;  Surgeon: Orbie Pyo, MD;  [facility]: [facility];  Service: Cardiovascular;  Laterality: N/A;   CORONARY STENT INTERVENTION N/A 08/30/2022   Procedure: CORONARY STENT INTERVENTION;  Surgeon: Orbie Pyo, MD;  [facility];  Service: Cardiovascular;  Laterality: N/A;   CYST REMOVAL TRUNK  09/04/2012   Procedure: CYST REMOVAL TRUNK;  Surgeon: Axel Filler, MD;  Location: [facility];  Service: General;  Laterality: N/A;  EXCISION OF MID BACK MASS   EYE SURGERY Left DR.SON   CATARACT SX1/2019   INCISION AND DRAINAGE ABSCESS Left 07/31/2013   Procedure: INCISIONAL/NON-INCISIONAL DEBRIDEMENT OF ABSCESS LEFT KNEE;  Surgeon: Shelda Pal, MD;  Location: [facility];  Service: Orthopedics;  Laterality: Left;   KIDNEY STONE SURGERY     multiple episodes    LEFT HEART CATH AND CORONARY ANGIOGRAPHY N/A 08/30/2022   Procedure: LEFT HEART CATH AND CORONARY ANGIOGRAPHY;  Surgeon: Orbie Pyo, MD;  [facility];  Service: Cardiovascular;  Laterality: N/A;   Right Knee Arthroscopy  2010   [facility]   TONSILLECTOMY     Family History  Problem Relation Age of Onset   Stroke Mother    Cancer Mother        Ovary, Breast   Heart attack Father 51   Heart attack Paternal Grandfather    Colon cancer Neg Hx    Prostate cancer Neg Hx    Diabetes Neg Hx    Esophageal cancer Neg Hx    Rectal cancer Neg Hx    Stomach cancer Neg Hx    Social History    Socioeconomic History   Marital status: Married    Spouse name: Not on file   Number of children: 2   Years of education: Not on file   Highest education level: Not on file  Occupational History   Occupation: Art gallery manager-- RETIRED 2012  Tobacco Use   Smoking status: Former    Packs/day: 0.75    Years: 20.00    Additional pack years: 0.00    Total pack years: 15.00    Types: Cigarettes, E-cigarettes   Smokeless tobacco: Never   Tobacco comments:    currently doing some e-cigarettes  Vaping Use   Vaping Use: Every day   Substances: Nicotine  Substance and Sexual Activity   Alcohol use: Yes    Comment: bourbon 3 fingers 3 x/week.   Drug use: No   Sexual activity: Not Currently  Other Topics Concern   Not on file  Social History Narrative   Lives w/ wife    Social Determinants of Health   Financial Resource Strain: Low Risk  (12/13/2022)   Overall Financial Resource Strain (CARDIA)    Difficulty of Paying Living Expenses: Not hard at all  Food Insecurity: No Food Insecurity (12/13/2022)   Hunger Vital Sign    Worried About Running Out of Food in the Last Year: Never true    Ran Out of Food in the Last Year: Never true  Transportation Needs: No Transportation Needs (12/13/2022)   PRAPARE - Administrator, Civil Service (Medical): No    Lack of Transportation (Non-Medical): No  Physical Activity: Sufficiently Active (12/13/2022)   Exercise Vital Sign    Days of Exercise per Week: 7 days    Minutes of Exercise per Session: 90 min  Stress: No Stress Concern Present (12/13/2022)   [employer] of Occupational Health - Occupational Stress Questionnaire    Feeling of Stress : Not at all  Social Connections: Socially Integrated (01/09/2022)   Social Connection and Isolation Panel [NHANES]    Frequency of Communication with Friends and Family: Three times a week    Frequency of Social Gatherings with Friends and Family: Three times a week    Attends  Religious Services: More than 4 times per year    Active Member of Clubs or Organizations: Yes    Attends Engineer, structural: More than 4 times per year    Marital Status: Married    Tobacco Counseling Counseling given: Not Answered Tobacco comments: currently doing some e-cigarettes   Clinical Intake:  Pre-visit preparation completed: Yes  Pain : No/denies pain     Nutritional Status: BMI > 30  Obese Nutritional Risks: None Diabetes: No  How often do you need to have someone help you when you read instructions, pamphlets, or other written materials from your doctor or pharmacy?: 1 - Never  Diabetic? no  Interpreter Needed?: No  Information entered by :: NAllen LPN   Activities of Daily Living    12/13/2022    8:54 AM 01/09/2022    9:00 AM  In your present state of health, do you have any difficulty performing the following activities:  Hearing? 0 0  Vision? 0 0  Difficulty concentrating or making decisions? 0 0  Walking or climbing stairs? 0 0  Dressing or bathing? 0 0  Doing errands, shopping? 0 0  Preparing Food and eating ? N N  Using the Toilet? N N  In the past six months, have you accidently leaked urine? N N  Do you have problems with loss of bowel control? N N  Managing your Medications? N N  Managing your Finances? N N  Housekeeping or managing your Housekeeping? N N    Patient Care Team: Loyola Mast, MD as PCP - General (Family Medicine) Jens Som Madolyn Frieze, MD as PCP - Cardiology (Cardiology) Jens Som Madolyn Frieze, MD as Consulting Physician (Cardiology) Larence Penning, OD as Referring Physician (Optometry) Vivianne Master as Referring Physician (Dentistry) Durene Romans, MD as Consulting Physician (Orthopedic Surgery) Claud Kelp, MD as Consulting Physician (General Surgery) Mat Carne, DO as Consulting Physician (Ophthalmology) Marcene Corning, MD as  Consulting Physician (Orthopedic Surgery)  Indicate any recent Medical Services  you may have received from other than [facility] providers in the past year (date may be approximate).     Assessment:   This is a routine wellness examination for Georgio.  Hearing/Vision screen Vision Screening - Comments:: Regular eye exams, [facility]  Dietary issues and exercise activities discussed: Current Exercise Habits: Home exercise routine, Type of exercise: strength training/weights;Other - see comments (golf, elliptical), Time (Minutes): > 60, Frequency (Times/Week): 7, Weekly Exercise (Minutes/Week): 0   Goals Addressed             This Visit's Progress    Patient Stated       12/13/2022, wants to get off some medications and elevate hip pain       Depression Screen    12/13/2022    8:54 AM 10/14/2022    8:43 AM 04/12/2022    2:03 PM 01/09/2022    8:57 AM 01/09/2022    8:55 AM 03/20/2021    8:47 AM 10/19/2020    9:47 AM  PHQ 2/9 Scores  PHQ - 2 Score 0 0 0 0 0 0 0    Fall Risk    12/13/2022    8:54 AM 10/14/2022    8:43 AM 04/12/2022    2:03 PM 01/09/2022    8:57 AM 03/20/2021    8:47 AM  Fall Risk   Falls in the past year? 0 0 0 0 0  Number falls in past yr: 0 0 0 0 0  Injury with Fall? 0 0  0 0  Risk for fall due to : Medication side effect No Fall Risks     Follow up Falls prevention discussed;Education provided;Falls evaluation completed Falls evaluation completed  Falls evaluation completed Falls evaluation completed    FALL RISK PREVENTION PERTAINING TO THE HOME:  Any stairs in or around the home? Yes  If so, are there any without handrails? No  Home free of loose throw rugs in walkways, pet beds, electrical cords, etc? Yes  Adequate lighting in your home to reduce risk of falls? Yes   ASSISTIVE DEVICES UTILIZED TO PREVENT FALLS:  Life alert? No  Use of a cane, walker or w/c? No  Grab bars in the bathroom? No  Shower chair or bench in shower? No  Elevated toilet seat or a handicapped toilet? No   TIMED UP AND GO:  Was the test performed? Yes .   Length of time to ambulate 10 feet: 5 sec.   Gait steady and fast without use of assistive device  Cognitive Function:    09/26/2017    9:20 AM 09/22/2015    2:41 PM  MMSE - Mini Mental State Exam  Orientation to time 5 5  Orientation to Place 5 5  Registration 3 3  Attention/ Calculation 5 5  Recall 3 3  Language- name 2 objects 2 2  Language- repeat 1 1  Language- follow 3 step command 3 3  Language- read & follow direction 1 1  Write a sentence 1 1  Copy design 1 1  Total score 30 30        12/13/2022    8:56 AM  6CIT Screen  What Year? 0 points  What month? 0 points  What time? 0 points  Count back from 20 0 points  Months in reverse 4 points  Repeat phrase 0 points  Total Score 4 points    Immunizations Immunization History  Administered Date(s)  Administered   Fluad Quad(high Dose 65+) 06/04/2019   HIB (PRP-OMP) 06/23/2007   Influenza Split 08/15/2011, 04/30/2022   Influenza, High Dose Seasonal PF 08/07/2016, 09/26/2017, 06/09/2020   Influenza,inj,Quad PF,6+ Mos 06/06/2014, 09/22/2015   Influenza-Unspecified 05/30/2018, 06/27/2021   Meningococcal B, OMV 01/16/2017, 03/21/2017   Meningococcal Conjugate 02/06/2016   Meningococcal polysaccharide vaccine (MPSV4) 06/23/2007   Moderna Sars-Covid-2 Vaccination 09/07/2019, 10/06/2019, 06/28/2020   Pneumococcal Conjugate-13 09/22/2015   Pneumococcal Polysaccharide-23 01/29/2013   Td 08/26/2006, 09/26/2017   Zoster, Live 02/06/2016    TDAP status: Up to date  Flu Vaccine status: Up to date  Pneumococcal vaccine status: Up to date  Covid-19 vaccine status: Completed vaccines  Qualifies for Shingles Vaccine? Yes   Zostavax completed Yes   Shingrix Completed?: No.    Education has been provided regarding the importance of this vaccine. Patient has been advised to call insurance company to determine out of pocket expense if they have not yet received this vaccine. Advised may also receive vaccine at local  pharmacy or Health Dept. Verbalized acceptance and understanding.  Screening Tests Health Maintenance  Topic Date Due   Medicare Annual Wellness (AWV)  01/10/2023   Zoster Vaccines- Shingrix (1 of 2) 03/14/2023 (Originally 11/14/1997)   INFLUENZA VACCINE  03/27/2023   COLONOSCOPY (Pts 45-23yrs Insurance coverage will need to be confirmed)  09/08/2024   DTaP/Tdap/Td (3 - Tdap) 09/27/2027   Pneumonia Vaccine 17+ Years old  Completed   Hepatitis C Screening  Completed   HPV VACCINES  Aged Out   COVID-19 Vaccine  Discontinued   Meningococcal B Vaccine  Discontinued    Health Maintenance  Health Maintenance Due  Topic Date Due   Medicare Annual Wellness (AWV)  01/10/2023    Colorectal cancer screening: Type of screening: Colonoscopy. Completed 09/08/2014. Repeat every 10 years  Lung Cancer Screening: (Low Dose CT Chest recommended if Age 70-80 years, 30 pack-year currently smoking OR have quit w/in 15years.) does not qualify.   Lung Cancer Screening Referral: no  Additional Screening:  Hepatitis C Screening: does qualify; Completed 09/26/2017  Vision Screening: Recommended annual ophthalmology exams for early detection of glaucoma and other disorders of the eye. Is the patient up to date with their annual eye exam?  Yes  Who is the provider or what is the name of the office in which the patient attends annual eye exams? [facility] If pt is not established with a provider, would they like to be referred to a provider to establish care? No .   Dental Screening: Recommended annual dental exams for proper oral hygiene  Community Resource Referral / Chronic Care Management: CRR required this visit?  No   CCM required this visit?  No      Plan:     I have personally reviewed and noted the following in the patient's chart:   Medical and social history Use of alcohol, tobacco or illicit drugs  Current medications and supplements including opioid prescriptions. Patient is not  currently taking opioid prescriptions. Functional ability and status Nutritional status Physical activity Advanced directives List of other physicians Hospitalizations, surgeries, and ER visits in previous 12 months Vitals Screenings to include cognitive, depression, and falls Referrals and appointments  In addition, I have reviewed and discussed with patient certain preventive protocols, quality metrics, and best practice recommendations. A written personalized care plan for preventive services as well as general preventive health recommendations were provided to patient.     Barb Merino, LPN   1/61/0960  Nurse Notes: none

## 2023-01-04 ENCOUNTER — Other Ambulatory Visit: Payer: Self-pay | Admitting: Cardiology

## 2023-01-06 DIAGNOSIS — G4733 Obstructive sleep apnea (adult) (pediatric): Secondary | ICD-10-CM | POA: Diagnosis not present

## 2023-01-07 ENCOUNTER — Encounter: Payer: Self-pay | Admitting: Family Medicine

## 2023-01-07 ENCOUNTER — Encounter: Payer: Self-pay | Admitting: Cardiology

## 2023-01-11 ENCOUNTER — Other Ambulatory Visit: Payer: Self-pay | Admitting: Cardiology

## 2023-01-13 ENCOUNTER — Other Ambulatory Visit: Payer: Self-pay | Admitting: *Deleted

## 2023-01-13 ENCOUNTER — Ambulatory Visit (INDEPENDENT_AMBULATORY_CARE_PROVIDER_SITE_OTHER): Payer: PPO | Admitting: Family Medicine

## 2023-01-13 ENCOUNTER — Encounter: Payer: Self-pay | Admitting: Family Medicine

## 2023-01-13 VITALS — BP 114/52 | HR 52 | Temp 98.1°F | Ht 65.0 in | Wt 191.0 lb

## 2023-01-13 DIAGNOSIS — E782 Mixed hyperlipidemia: Secondary | ICD-10-CM

## 2023-01-13 DIAGNOSIS — I10 Essential (primary) hypertension: Secondary | ICD-10-CM

## 2023-01-13 DIAGNOSIS — L723 Sebaceous cyst: Secondary | ICD-10-CM | POA: Diagnosis not present

## 2023-01-13 DIAGNOSIS — I25119 Atherosclerotic heart disease of native coronary artery with unspecified angina pectoris: Secondary | ICD-10-CM

## 2023-01-13 DIAGNOSIS — I209 Angina pectoris, unspecified: Secondary | ICD-10-CM

## 2023-01-13 LAB — BASIC METABOLIC PANEL
BUN: 19 mg/dL (ref 6–23)
CO2: 25 mEq/L (ref 19–32)
Calcium: 9.1 mg/dL (ref 8.4–10.5)
Chloride: 105 mEq/L (ref 96–112)
Creatinine, Ser: 0.95 mg/dL (ref 0.40–1.50)
GFR: 78.49 mL/min (ref >60.00)
Glucose, Bld: 101 mg/dL — ABNORMAL HIGH (ref 70–99)
Potassium: 4.7 mEq/L (ref 3.5–5.1)
Sodium: 139 mEq/L (ref 135–145)

## 2023-01-13 MED ORDER — CHLORTHALIDONE 25 MG PO TABS: 12.5000 mg | ORAL_TABLET | Freq: Every day | ORAL | 3 refills | Status: DC

## 2023-01-13 MED ORDER — CHLORTHALIDONE 25 MG PO TABS: 12.5000 mg | ORAL_TABLET | Freq: Every day | ORAL | 10 refills | Status: DC

## 2023-01-13 NOTE — Progress Notes (Signed)
[facility] [address] [place] [address] Dept: [phone] Dept Fax: [phone]  Chronic Care Office Visit  Subjective:    Patient ID: Devin Martin, male    DOB: 1945-10-21, 77 y.o..   MRN: [id number]  Chief Complaint  Patient presents with   Medical Management of Chronic Issues    3 month f/u.  Fasting today.    History of Present Illness:  Patient is in today for reassessment of chronic medical issues.  [name] has a history of hypertension. He is currently managed on diltiazem 240 mg daily, irbesartan 75 mg daily, and chlorthalidone 12.5 mg daily (added in past 3 months by Dr. Jens Som).   [name] has a history of a prior MI with stent placement and hyperlipidemia. He underwent a cardiac cath on 08/30/2022. This showed a proximal circumflex artery lesion that was 90% stenosed. He also had a  proximal LAD to mid LAD lesion 50% stenosed, and a LAD ostium to proximal LAD lesion 25% stenosed.  A stent was successfully placed and post intervention, there was a 0% residual stenosis. Heis managed on  aspirin 81 mg daily, clopidogrel 75 mg daily, isosorbide 60 mg 1 1/2 tabs daily, and rosuvastatin 40 mg daily.  He is feeling much better these days. He remains active at the gym daily.  Past Medical History: Patient Active Problem List   Diagnosis Date Noted   Angina pectoris (HCC) 08/30/2022   Obesity (BMI 30.0-34.9) 10/19/2021   Prediabetes 10/19/2021   Remote history of stroke 06/03/2019   Cholecystitis 01/16/2017   S/P BKA (below knee amputation) (HCC) 05/06/2016   Osteoarthritis of right knee 12/31/2013   Hypogonadism male 08/12/2011   GERD 04/05/2010   Essential hypertension 01/26/2009   Hyperlipidemia 06/23/2007   Obstructive sleep apnea 06/23/2007   Coronary artery disease involving native coronary artery of native heart with angina pectoris (HCC) 06/23/2007   History of kidney stones 06/17/2007    Past Surgical History:  Procedure Laterality Date   AMPUT TRAUM LEG BELOW KNEE UNILT W/O COMPL Left    CARDIAC CATHETERIZATION     CORONARY IMAGING/OCT N/A 08/30/2022   Procedure: INTRAVASCULAR IMAGING/OCT;  Surgeon: Orbie Pyo, MD;  [facility];  Service: Cardiovascular;  Laterality: N/A;   CORONARY PRESSURE/FFR STUDY N/A 08/30/2022   Procedure: INTRAVASCULAR PRESSURE WIRE/FFR STUDY;  Surgeon: Orbie Pyo, MD;  [facility];  Service: Cardiovascular;  Laterality: N/A;   CORONARY STENT INTERVENTION N/A 08/30/2022   Procedure: CORONARY STENT INTERVENTION;  Surgeon: Orbie Pyo, MD;  [facility];  Service: Cardiovascular;  Laterality: N/A;   CYST REMOVAL TRUNK  09/04/2012   Procedure: CYST REMOVAL TRUNK;  Surgeon: Axel Filler, MD;  Location: [facility];  Service: General;  Laterality: N/A;  EXCISION OF MID BACK MASS   EYE SURGERY Left DR.SON   CATARACT SX1/2019   INCISION AND DRAINAGE ABSCESS Left 07/31/2013   Procedure: INCISIONAL/NON-INCISIONAL DEBRIDEMENT OF ABSCESS LEFT KNEE;  Surgeon: Shelda Pal, MD;  Location: [facility];  Service: Orthopedics;  Laterality: Left;   KIDNEY STONE SURGERY     multiple episodes    LEFT HEART CATH AND CORONARY ANGIOGRAPHY N/A 08/30/2022   Procedure: LEFT HEART CATH AND CORONARY ANGIOGRAPHY;  Surgeon: Orbie Pyo, MD;  [facility];  Service: Cardiovascular;  Laterality: N/A;   Right Knee Arthroscopy  2010   [facility]   TONSILLECTOMY  Family History  Problem Relation Age of Onset   Stroke Mother    Cancer Mother        Ovary, Breast   Heart attack Father 6   Heart attack Paternal Grandfather    Colon cancer Neg Hx    Prostate cancer Neg Hx    Diabetes Neg Hx    Esophageal cancer Neg Hx    Rectal cancer Neg Hx    Stomach cancer Neg Hx    Outpatient Medications Prior to Visit  Medication Sig Dispense Refill   AMBULATORY NON FORMULARY MEDICATION  Prosthetic Leg Liners 2 application 0   AMBULATORY NON FORMULARY MEDICATION ResMed CPAP Machine     Aspirin 81 MG EC tablet Take 1 tablet (81 mg total) by mouth daily.     Cholecalciferol (D3 ADULT PO) Take 1 tablet by mouth every evening.     clopidogrel (PLAVIX) 75 MG tablet TAKE 1 TABLET(75 MG) BY MOUTH DAILY WITH BREAKFAST 30 tablet 3   Cyanocobalamin (B-12 PO) Take 1 tablet by mouth every evening.     diltiazem (TIAZAC) 240 MG 24 hr capsule Take 1 capsule (240 mg total) by mouth daily. 90 capsule 3   irbesartan (AVAPRO) 150 MG tablet Take 0.5 tablets (75 mg total) by mouth daily. 90 tablet 3   isosorbide mononitrate (IMDUR) 60 MG 24 hr tablet Take 1.5 tablets (90 mg total) by mouth every evening. TAKE 1 AND 1/2 TABLETS(90 MG) BY MOUTH DAILY 135 tablet 1   nitroGLYCERIN (NITROSTAT) 0.4 MG SL tablet Place 1 tablet (0.4 mg total) under the tongue every 5 minutes as needed for chest pain. 25 tablet 2   Nitroglycerin 0.4 % OINT Place 1 Application rectally in the morning and at bedtime. Apply 1 inch (375 mg) ointment intra-anally every 12 hours for anal fissure 30 g 1   potassium citrate (UROCIT-K) 10 MEQ (1080 MG) SR tablet Take 2 tablets (20 mEq total) by mouth daily. 180 tablet 3   rosuvastatin (CRESTOR) 40 MG tablet TAKE 1 TABLET(40 MG) BY MOUTH DAILY 90 tablet 3   TURMERIC PO Take 1 capsule by mouth daily.     benzonatate (TESSALON) 100 MG capsule Take 1 capsule (100 mg total) by mouth 3 (three) times daily as needed for cough. 30 capsule 0   chlorthalidone (HYGROTON) 25 MG tablet Take 0.5 tablets (12.5 mg total) by mouth daily. 30 tablet 10   No facility-administered medications prior to visit.   Allergies  Allergen Reactions   Penicillins Hives and Other (See Comments)    syncope   Tylox [Oxycodone-Acetaminophen] Other (See Comments)    "messes me up"   Codeine Nausea Only   Objective:   Today's Vitals   01/13/23 0835  BP: (!) 114/52  Pulse: (!) 52  Temp: 98.1 F (36.7 C)   TempSrc: Temporal  SpO2: 97%  Weight: 191 lb (86.6 kg)  Height: 5\' 5"  (1.651 m)   Body mass index is 31.78 kg/m.   General: Well developed, well nourished. No acute distress. Psych: Alert and oriented. Normal mood and affect.  There are no preventive care reminders to display for this patient.    Assessment & Plan:   Problem List Items Addressed This Visit       Cardiovascular and Mediastinum   Essential hypertension    Blood pressure is in good control. Continue diltiazem 240 mg daily, irbesartan 150 mg 1/2 tab daily, and chlorthalidone 25 mg 1/2 tab daily.      Relevant Medications  chlorthalidone (HYGROTON) 25 MG tablet   Other Relevant Orders   Basic metabolic panel   Coronary artery disease involving native coronary artery of native heart with angina pectoris (HCC) - Primary    Stent placement 08/2022. Feeling much improved. Continue aspirin 81 mg daily, isosorbide 60 mg 1 1/2 tabs daily, and clopidogrel 75 mg daily.      Relevant Medications   chlorthalidone (HYGROTON) 25 MG tablet   Angina pectoris (HCC)    Stable. No chest pain. Continue isosorbide 60 mg 1 1/2 tabs daily.      Relevant Medications   chlorthalidone (HYGROTON) 25 MG tablet     Other   Hyperlipidemia    Lipids at goal. Continue rosuvastatin 40 mg daily.       Relevant Medications   chlorthalidone (HYGROTON) 25 MG tablet    Return in about 3 months (around 04/15/2023) for Reassessment.   Loyola Mast, MD

## 2023-01-13 NOTE — Assessment & Plan Note (Signed)
Blood pressure is in good control. Continue diltiazem 240 mg daily, irbesartan 150 mg 1/2 tab daily, and chlorthalidone 25 mg 1/2 tab daily.

## 2023-01-13 NOTE — Assessment & Plan Note (Signed)
Stable. No chest pain. Continue isosorbide 60 mg 1 1/2 tabs daily.

## 2023-01-13 NOTE — Assessment & Plan Note (Signed)
Stent placement 08/2022. Feeling much improved. Continue aspirin 81 mg daily, isosorbide 60 mg 1 1/2 tabs daily, and clopidogrel 75 mg daily.

## 2023-01-13 NOTE — Assessment & Plan Note (Signed)
Lipids at goal. Continue rosuvastatin 40 mg daily.  

## 2023-01-15 ENCOUNTER — Other Ambulatory Visit (HOSPITAL_COMMUNITY)
Admission: RE | Admit: 2023-01-15 | Discharge: 2023-01-15 | Disposition: A | Discharge status: Home / Self Care | Payer: PPO | Source: Ambulatory Visit | Attending: Gastroenterology | Admitting: Gastroenterology

## 2023-01-15 ENCOUNTER — Encounter: Payer: Self-pay | Admitting: Family Medicine

## 2023-01-15 ENCOUNTER — Ambulatory Visit (INDEPENDENT_AMBULATORY_CARE_PROVIDER_SITE_OTHER): Payer: PPO | Admitting: Family Medicine

## 2023-01-15 VITALS — BP 110/62 | HR 61 | Temp 98.0°F | Ht 65.0 in | Wt 188.2 lb

## 2023-01-15 DIAGNOSIS — L723 Sebaceous cyst: Secondary | ICD-10-CM | POA: Diagnosis not present

## 2023-01-15 NOTE — Progress Notes (Signed)
[facility] [address] [place] [address] Dept: [phone] Dept Fax: [phone]  Office Visit  Subjective:    Patient ID: Devin Martin, male    DOB: December 10, 1945, 77 y.o..   MRN: [id number]  Chief Complaint  Patient presents with   Cyst    Cyst removal from back.     History of Present Illness:  Patient is in today for for removal of a sebaceous cyst. I saw him on 5/20 and we discussed returning for the procedure.  Past Medical History: Patient Active Problem List   Diagnosis Date Noted   Angina pectoris (HCC) 08/30/2022   Obesity (BMI 30.0-34.9) 10/19/2021   Prediabetes 10/19/2021   Remote history of stroke 06/03/2019   Cholecystitis 01/16/2017   S/P BKA (below knee amputation) (HCC) 05/06/2016   Osteoarthritis of right knee 12/31/2013   Hypogonadism male 08/12/2011   GERD 04/05/2010   Essential hypertension 01/26/2009   Hyperlipidemia 06/23/2007   Obstructive sleep apnea 06/23/2007   Coronary artery disease involving native coronary artery of native heart with angina pectoris (HCC) 06/23/2007   History of kidney stones 06/17/2007   Past Surgical History:  Procedure Laterality Date   AMPUT TRAUM LEG BELOW KNEE UNILT W/O COMPL Left    CARDIAC CATHETERIZATION     CORONARY IMAGING/OCT N/A 08/30/2022   Procedure: INTRAVASCULAR IMAGING/OCT;  Surgeon: Orbie Pyo, MD;  [facility];  Service: Cardiovascular;  Laterality: N/A;   CORONARY PRESSURE/FFR STUDY N/A 08/30/2022   Procedure: INTRAVASCULAR PRESSURE WIRE/FFR STUDY;  Surgeon: Orbie Pyo, MD;  [facility];  Service: Cardiovascular;  Laterality: N/A;   CORONARY STENT INTERVENTION N/A 08/30/2022   Procedure: CORONARY STENT INTERVENTION;  Surgeon: Orbie Pyo, MD;  [facility];  Service: Cardiovascular;  Laterality: N/A;   CYST REMOVAL TRUNK  09/04/2012   Procedure: CYST REMOVAL TRUNK;  Surgeon:  Axel Filler, MD;  Location: [facility];  Service: General;  Laterality: N/A;  EXCISION OF MID BACK MASS   EYE SURGERY Left DR.SON   CATARACT SX1/2019   INCISION AND DRAINAGE ABSCESS Left 07/31/2013   Procedure: INCISIONAL/NON-INCISIONAL DEBRIDEMENT OF ABSCESS LEFT KNEE;  Surgeon: Shelda Pal, MD;  Location: [facility];  Service: Orthopedics;  Laterality: Left;   KIDNEY STONE SURGERY     multiple episodes    LEFT HEART CATH AND CORONARY ANGIOGRAPHY N/A 08/30/2022   Procedure: LEFT HEART CATH AND CORONARY ANGIOGRAPHY;  Surgeon: Orbie Pyo, MD;  [facility];  Service: Cardiovascular;  Laterality: N/A;   Right Knee Arthroscopy  2010   [facility]   TONSILLECTOMY     Family History  Problem Relation Age of Onset   Stroke Mother    Cancer Mother        Ovary, Breast   Heart attack Father 10   Heart attack Paternal Grandfather    Colon cancer Neg Hx    Prostate cancer Neg Hx    Diabetes Neg Hx    Esophageal cancer Neg Hx    Rectal cancer Neg Hx    Stomach cancer Neg Hx    Outpatient Medications Prior to Visit  Medication Sig Dispense Refill   AMBULATORY NON FORMULARY MEDICATION Prosthetic Leg Liners 2 application 0   AMBULATORY NON FORMULARY MEDICATION ResMed CPAP Machine     Aspirin 81 MG EC tablet Take 1 tablet (81 mg total) by mouth daily.     chlorthalidone (HYGROTON)  25 MG tablet Take 0.5 tablets (12.5 mg total) by mouth daily. 45 tablet 3   Cholecalciferol (D3 ADULT PO) Take 1 tablet by mouth every evening.     clopidogrel (PLAVIX) 75 MG tablet TAKE 1 TABLET(75 MG) BY MOUTH DAILY WITH BREAKFAST 30 tablet 3   Cyanocobalamin (B-12 PO) Take 1 tablet by mouth every evening.     diltiazem (TIAZAC) 240 MG 24 hr capsule Take 1 capsule (240 mg total) by mouth daily. 90 capsule 3   irbesartan (AVAPRO) 150 MG tablet Take 0.5 tablets (75 mg total) by mouth daily. 90 tablet 3   isosorbide mononitrate (IMDUR) 60 MG 24 hr tablet Take 1.5 tablets (90 mg  total) by mouth every evening. TAKE 1 AND 1/2 TABLETS(90 MG) BY MOUTH DAILY 135 tablet 1   nitroGLYCERIN (NITROSTAT) 0.4 MG SL tablet Place 1 tablet (0.4 mg total) under the tongue every 5 minutes as needed for chest pain. 25 tablet 2   Nitroglycerin 0.4 % OINT Place 1 Application rectally in the morning and at bedtime. Apply 1 inch (375 mg) ointment intra-anally every 12 hours for anal fissure 30 g 1   potassium citrate (UROCIT-K) 10 MEQ (1080 MG) SR tablet Take 2 tablets (20 mEq total) by mouth daily. 180 tablet 3   rosuvastatin (CRESTOR) 40 MG tablet TAKE 1 TABLET(40 MG) BY MOUTH DAILY 90 tablet 3   TURMERIC PO Take 1 capsule by mouth daily.     No facility-administered medications prior to visit.   Allergies  Allergen Reactions   Penicillins Hives and Other (See Comments)    syncope   Tylox [Oxycodone-Acetaminophen] Other (See Comments)    "messes me up"   Codeine Nausea Only     Objective:   Today's Vitals   01/15/23 0756  BP: 110/62  Pulse: 61  Temp: 98 F (36.7 C)  TempSrc: Temporal  SpO2: 96%  Weight: 188 lb 3.2 oz (85.4 kg)  Height: 5\' 5"  (1.651 m)   Body mass index is 31.32 kg/m.   General: Well developed, well nourished. No acute distress. Skin: Warm and dry. There is a small3-4 mm subcutaneous firm nodule under the skin of the   upper right back. Nos ign of redness or inflammation. Psych: Alert and oriented. Normal mood and affect.  There are no preventive care reminders to display for this patient.  PROCEDURE- Cyst removal Indication: Sebaceous cyst Anesthesia: Local, 1% lidocaine with epinephrine  PARQ reviewed with patient. Consent signed. Site cleaned with alcohol.  Anesthetized with 2 cc of 1% lidocaine with epinephrine. Site cleaned with betadine. Incised horizontally over the site of the nodule. Sharply dissected the cyst. The cyst did open and a cheesy material was expressed. 3 mm cyst removed. Cleaned wound with guaze and peroxide. Placed a single  suture of 3-0 Dermilon. Sterile bandage applied. Patient tolerated procedure well. Routine wound care instructions reviewed with patient.    Assessment & Plan:   Problem List Items Addressed This Visit   None Visit Diagnoses     Sebaceous cyst    -  Primary   Removed as noted above. Follow-up in 1 week for suture removal.   Relevant Orders   Surgical pathology      Return in about 1 week (around 01/22/2023).   Loyola Mast, MD

## 2023-01-16 LAB — SURGICAL PATHOLOGY

## 2023-01-22 ENCOUNTER — Encounter: Payer: Self-pay | Admitting: Family Medicine

## 2023-01-24 ENCOUNTER — Ambulatory Visit: Payer: PPO | Admitting: Family Medicine

## 2023-01-24 ENCOUNTER — Ambulatory Visit (INDEPENDENT_AMBULATORY_CARE_PROVIDER_SITE_OTHER): Payer: PPO | Admitting: Family Medicine

## 2023-01-24 ENCOUNTER — Encounter: Payer: Self-pay | Admitting: Family Medicine

## 2023-01-24 VITALS — BP 118/72 | HR 59 | Temp 98.0°F | Ht 65.0 in | Wt 188.0 lb

## 2023-01-24 DIAGNOSIS — Z4802 Encounter for removal of sutures: Secondary | ICD-10-CM

## 2023-01-24 DIAGNOSIS — Z91048 Other nonmedicinal substance allergy status: Secondary | ICD-10-CM

## 2023-01-24 DIAGNOSIS — L723 Sebaceous cyst: Secondary | ICD-10-CM

## 2023-01-24 NOTE — Patient Instructions (Signed)
Use hydrocortisone 1% cream twice a day as needed for localized skin allergy.

## 2023-01-24 NOTE — Progress Notes (Signed)
[facility] [address] [place] [address] Dept: [phone] Dept Fax: [phone]  Office Visit  Subjective:    Patient ID: Devin Martin, male    DOB: April 02, 1946, 77 y.o..   MRN: [id number]  Chief Complaint  Patient presents with   Medical Management of Chronic Issues    Remove stitch in back.     History of Present Illness:  Patient is in today for for suture removal. I saw him on 5/22 to excise a sebaceous cyst. He notes this has been doing well, though he has some itching around the site.  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 01/24/2023   Angina pectoris (HCC) 08/30/2022   Obesity (BMI 30.0-34.9) 10/19/2021   Prediabetes 10/19/2021   Remote history of stroke 06/03/2019   Cholecystitis 01/16/2017   S/P BKA (below knee amputation) (HCC) 05/06/2016   Osteoarthritis of right knee 12/31/2013   Hypogonadism male 08/12/2011   GERD 04/05/2010   Essential hypertension 01/26/2009   Hyperlipidemia 06/23/2007   Obstructive sleep apnea 06/23/2007   Coronary artery disease involving native coronary artery of native heart with angina pectoris (HCC) 06/23/2007   History of kidney stones 06/17/2007   Past Surgical History:  Procedure Laterality Date   AMPUT TRAUM LEG BELOW KNEE UNILT W/O COMPL Left    CARDIAC CATHETERIZATION     CORONARY IMAGING/OCT N/A 08/30/2022   Procedure: INTRAVASCULAR IMAGING/OCT;  Surgeon: Orbie Pyo, MD;  [facility];  Service: Cardiovascular;  Laterality: N/A;   CORONARY PRESSURE/FFR STUDY N/A 08/30/2022   Procedure: INTRAVASCULAR PRESSURE WIRE/FFR STUDY;  Surgeon: Orbie Pyo, MD;  [facility];  Service: Cardiovascular;  Laterality: N/A;   CORONARY STENT INTERVENTION N/A 08/30/2022   Procedure: CORONARY STENT INTERVENTION;  Surgeon: Orbie Pyo, MD;  [facility];   Service: Cardiovascular;  Laterality: N/A;   CYST REMOVAL TRUNK  09/04/2012   Procedure: CYST REMOVAL TRUNK;  Surgeon: Axel Filler, MD;  Location: [facility];  Service: General;  Laterality: N/A;  EXCISION OF MID BACK MASS   EYE SURGERY Left DR.SON   CATARACT SX1/2019   INCISION AND DRAINAGE ABSCESS Left 07/31/2013   Procedure: INCISIONAL/NON-INCISIONAL DEBRIDEMENT OF ABSCESS LEFT KNEE;  Surgeon: Shelda Pal, MD;  Location: [facility];  Service: Orthopedics;  Laterality: Left;   KIDNEY STONE SURGERY     multiple episodes    LEFT HEART CATH AND CORONARY ANGIOGRAPHY N/A 08/30/2022   Procedure: LEFT HEART CATH AND CORONARY ANGIOGRAPHY;  Surgeon: Orbie Pyo, MD;  [facility];  Service: Cardiovascular;  Laterality: N/A;   Right Knee Arthroscopy  2010   [facility]   TONSILLECTOMY     Family History  Problem Relation Age of Onset   Stroke Mother    Cancer Mother        Ovary, Breast   Heart attack Father 28   Heart attack Paternal Grandfather    Colon cancer Neg Hx    Prostate cancer Neg Hx    Diabetes Neg Hx    Esophageal cancer Neg Hx    Rectal cancer Neg Hx    Stomach cancer Neg Hx    Outpatient Medications Prior to Visit  Medication Sig Dispense Refill   AMBULATORY NON FORMULARY MEDICATION Prosthetic Leg Liners 2 application 0   AMBULATORY NON FORMULARY MEDICATION ResMed CPAP Machine  Aspirin 81 MG EC tablet Take 1 tablet (81 mg total) by mouth daily.     chlorthalidone (HYGROTON) 25 MG tablet Take 0.5 tablets (12.5 mg total) by mouth daily. 45 tablet 3   Cholecalciferol (D3 ADULT PO) Take 1 tablet by mouth every evening.     clopidogrel (PLAVIX) 75 MG tablet TAKE 1 TABLET(75 MG) BY MOUTH DAILY WITH BREAKFAST 30 tablet 3   Cyanocobalamin (B-12 PO) Take 1 tablet by mouth every evening.     diltiazem (TIAZAC) 240 MG 24 hr capsule Take 1 capsule (240 mg total) by mouth daily. 90 capsule 3   irbesartan (AVAPRO) 150 MG tablet Take 0.5 tablets (75  mg total) by mouth daily. 90 tablet 3   isosorbide mononitrate (IMDUR) 60 MG 24 hr tablet Take 1.5 tablets (90 mg total) by mouth every evening. TAKE 1 AND 1/2 TABLETS(90 MG) BY MOUTH DAILY 135 tablet 1   nitroGLYCERIN (NITROSTAT) 0.4 MG SL tablet Place 1 tablet (0.4 mg total) under the tongue every 5 minutes as needed for chest pain. 25 tablet 2   Nitroglycerin 0.4 % OINT Place 1 Application rectally in the morning and at bedtime. Apply 1 inch (375 mg) ointment intra-anally every 12 hours for anal fissure 30 g 1   potassium citrate (UROCIT-K) 10 MEQ (1080 MG) SR tablet Take 2 tablets (20 mEq total) by mouth daily. 180 tablet 3   rosuvastatin (CRESTOR) 40 MG tablet TAKE 1 TABLET(40 MG) BY MOUTH DAILY 90 tablet 3   TURMERIC PO Take 1 capsule by mouth daily.     No facility-administered medications prior to visit.   Allergies  Allergen Reactions   Penicillins Hives and Other (See Comments)    syncope   Tylox [Oxycodone-Acetaminophen] Other (See Comments)    "messes me up"   Codeine Nausea Only     Objective:   Today's Vitals   01/24/23 0901  BP: 118/72  Pulse: (!) 59  Temp: 98 F (36.7 C)  TempSrc: Temporal  SpO2: 96%  Weight: 188 lb (85.3 kg)  Height: 5\' 5"  (1.651 m)   Body mass index is 31.28 kg/m.   General: Well developed, well nourished. No acute distress. Skin: The wound is healing well. There is some red papular lesions in a pattern consistent with the   adhesive bandages the patient has been wearing. Psych: Alert and oriented. Normal mood and affect.  There are no preventive care reminders to display for this patient.  Lab Results Pathology: FINAL MICROSCOPIC DIAGNOSIS:   A. SKIN, BACK, EXCISION:  - Benign epidermal inclusion cyst   PROCEDURE- Suture Removal Indication: Presence of sutures Anesthesia: Local, None  PARQ reviewed with patient. Verbal consent obtained. Single suture removed with suture scissors. Site cleaned with alcohol. Gauze and paper tape  use for dressing.    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   Sebaceous cyst       Pathology confirms clinical diagnosis.       Return for Follow-up as scheduled.   Loyola Mast, MD

## 2023-01-27 ENCOUNTER — Encounter: Payer: Self-pay | Admitting: Family Medicine

## 2023-01-29 ENCOUNTER — Telehealth: Payer: Self-pay | Admitting: Family Medicine

## 2023-01-29 NOTE — Telephone Encounter (Signed)
Please call [facility]/ Randa Evens [phone]  Needs to know about some forms she faxed.

## 2023-01-29 NOTE — Telephone Encounter (Signed)
Lft VM to rtn call. Dm/cma  

## 2023-01-30 NOTE — Telephone Encounter (Signed)
Spoke to [place], she will refax over the standard for so we can put dx codes on it and sign it.  Dm/cma

## 2023-01-31 NOTE — Telephone Encounter (Signed)
Received form and faxed it back to [facility] @ [phone]. Dm/cma

## 2023-02-03 DIAGNOSIS — Z89512 Acquired absence of left leg below knee: Secondary | ICD-10-CM | POA: Diagnosis not present

## 2023-02-06 DIAGNOSIS — G4733 Obstructive sleep apnea (adult) (pediatric): Secondary | ICD-10-CM | POA: Diagnosis not present

## 2023-02-21 ENCOUNTER — Other Ambulatory Visit: Payer: Self-pay | Admitting: Family Medicine

## 2023-02-21 DIAGNOSIS — Z87442 Personal history of urinary calculi: Secondary | ICD-10-CM

## 2023-03-08 DIAGNOSIS — G4733 Obstructive sleep apnea (adult) (pediatric): Secondary | ICD-10-CM | POA: Diagnosis not present

## 2023-03-22 ENCOUNTER — Other Ambulatory Visit: Payer: Self-pay | Admitting: Cardiology

## 2023-03-26 ENCOUNTER — Encounter (INDEPENDENT_AMBULATORY_CARE_PROVIDER_SITE_OTHER): Payer: Self-pay

## 2023-03-31 NOTE — Progress Notes (Signed)
HPI: FU coronary artery disease. The patient underwent cardiac catheterization in December 2005 secondary to a non-ST elevation myocardial infarction. At that time, his ejection fraction was 60%. He had successful PCI of his mid LAD with a drug-eluting stent. Note, there was jailing of 2 small diagonals by the LAD stent. He underwent cardiac catheterization in January of 2011 following MI. This revealed an ejection fraction of 55-60%. The stent in the LAD was patent. The stent jails 2 diagonal branches, both diagonals have 90% ostial stenoses. There was a 25-30% circumflex. The right coronary artery had a 25-30% lesion. There was intense vasospasm and it was felt that his infarct was related to spasm. Abdominal ultrasound 2/17 showed no aneurysm. Patient had repeat catheterization January 2024 due to exertional chest pain.  The stent in his proximal LAD had moderate in-stent restenosis (50) with normal FFR, 90% proximal circumflex lesion treated with drug-eluting stent and LVEDP of 20 mmHg.  Since I last saw him, the patient denies any dyspnea on exertion, orthopnea, PND, pedal edema, palpitations, syncope or chest pain.   Current Outpatient Medications  Medication Sig Dispense Refill   AMBULATORY NON FORMULARY MEDICATION Prosthetic Leg Liners 2 application 0   AMBULATORY NON FORMULARY MEDICATION ResMed CPAP Machine     Aspirin 81 MG EC tablet Take 1 tablet (81 mg total) by mouth daily.     chlorthalidone (HYGROTON) 25 MG tablet Take 0.5 tablets (12.5 mg total) by mouth daily. 45 tablet 3   Cholecalciferol (D3 ADULT PO) Take 1 tablet by mouth every evening.     clopidogrel (PLAVIX) 75 MG tablet TAKE 1 TABLET(75 MG) BY MOUTH DAILY WITH BREAKFAST 30 tablet 3   Cyanocobalamin (B-12 PO) Take 1 tablet by mouth every evening.     diltiazem (TIAZAC) 240 MG 24 hr capsule Take 1 capsule (240 mg total) by mouth daily. 90 capsule 3   irbesartan (AVAPRO) 150 MG tablet Take 0.5 tablets (75 mg total) by mouth  daily. 90 tablet 3   isosorbide mononitrate (IMDUR) 60 MG 24 hr tablet TAKE 1 AND 1/2 TABLETS(90 MG) BY MOUTH EVERY EVENING 135 tablet 1   nitroGLYCERIN (NITROSTAT) 0.4 MG SL tablet Place 1 tablet (0.4 mg total) under the tongue every 5 minutes as needed for chest pain. 25 tablet 2   potassium citrate (UROCIT-K) 10 MEQ (1080 MG) SR tablet TAKE 2 TABLET BY MOUTH DAILY 180 tablet 3   rosuvastatin (CRESTOR) 40 MG tablet TAKE 1 TABLET(40 MG) BY MOUTH DAILY 90 tablet 3   TURMERIC PO Take 1 capsule by mouth daily.     Nitroglycerin 0.4 % OINT Place 1 Application rectally in the morning and at bedtime. Apply 1 inch (375 mg) ointment intra-anally every 12 hours for anal fissure (Patient not taking: Reported on 04/04/2023) 30 g 1   No current facility-administered medications for this visit.     Past Medical History:  Diagnosis Date   ANXIETY 10/13/2009   Qualifier: Diagnosis of  By: Jens Som, MD, Lyn Hollingshead    Arthritis 1968   pt wears a lt bk prosthesis   Bronchitis, chronic (HCC)    CAD (coronary artery disease)    Non-ST segment elevation myocardial infarction in 2005 with drug-eluting stent to the left anterior descending. 08-2009 - non-ST segment elevation myocardial infarction, felt secondary to vasospasm.   Calculus, kidney    hx   Complication of anesthesia    itching after preop med  ?name - 2013, no SOB  Glaucoma    Hyperlipidemia    Hypertension    Myocardial infarction [facility]) 2005   x2, 2005, 2010   OSA (obstructive sleep apnea)    AHI 68/hr in 2002-uses a cpap   Pneumonia    hx of    Sleep apnea    wears CPAP   Wears glasses     Past Surgical History:  Procedure Laterality Date   AMPUT TRAUM LEG BELOW KNEE UNILT W/O COMPL Left    CARDIAC CATHETERIZATION     CORONARY IMAGING/OCT N/A 08/30/2022   Procedure: INTRAVASCULAR IMAGING/OCT;  Surgeon: Orbie Pyo, MD;  [facility];  Service: Cardiovascular;  Laterality: N/A;   CORONARY PRESSURE/FFR  STUDY N/A 08/30/2022   Procedure: INTRAVASCULAR PRESSURE WIRE/FFR STUDY;  Surgeon: Orbie Pyo, MD;  [facility];  Service: Cardiovascular;  Laterality: N/A;   CORONARY STENT INTERVENTION N/A 08/30/2022   Procedure: CORONARY STENT INTERVENTION;  Surgeon: Orbie Pyo, MD;  [facility];  Service: Cardiovascular;  Laterality: N/A;   CYST REMOVAL TRUNK  09/04/2012   Procedure: CYST REMOVAL TRUNK;  Surgeon: Axel Filler, MD;  Location: [facility];  Service: General;  Laterality: N/A;  EXCISION OF MID BACK MASS   EYE SURGERY Left DR.SON   CATARACT SX1/2019   INCISION AND DRAINAGE ABSCESS Left 07/31/2013   Procedure: INCISIONAL/NON-INCISIONAL DEBRIDEMENT OF ABSCESS LEFT KNEE;  Surgeon: Shelda Pal, MD;  Location: [facility];  Service: Orthopedics;  Laterality: Left;   KIDNEY STONE SURGERY     multiple episodes    LEFT HEART CATH AND CORONARY ANGIOGRAPHY N/A 08/30/2022   Procedure: LEFT HEART CATH AND CORONARY ANGIOGRAPHY;  Surgeon: Orbie Pyo, MD;  [facility];  Service: Cardiovascular;  Laterality: N/A;   Right Knee Arthroscopy  2010   [facility]   TONSILLECTOMY      Social History   Socioeconomic History   Marital status: Married    Spouse name: Not on file   Number of children: 2   Years of education: Not on file   Highest education level: Not on file  Occupational History   Occupation: Art gallery manager-- RETIRED 2012  Tobacco Use   Smoking status: Former    Current packs/day: 0.00    Average packs/day: 0.8 packs/day for 20.0 years (15.0 ttl pk-yrs)    Types: Cigarettes, E-cigarettes    Start date: 1995    Quit date: 2015    Years since quitting: 9.6   Smokeless tobacco: Never   Tobacco comments:    currently doing some e-cigarettes  Vaping Use   Vaping status: Every Day   Substances: Nicotine  Substance and Sexual Activity   Alcohol use: Yes    Comment: bourbon 3 fingers 3 x/week.   Drug use: No    Sexual activity: Not Currently  Other Topics Concern   Not on file  Social History Narrative   Lives w/ wife    Social Determinants of Health   Financial Resource Strain: Low Risk  (12/13/2022)   Overall Financial Resource Strain (CARDIA)    Difficulty of Paying Living Expenses: Not hard at all  Food Insecurity: No Food Insecurity (12/13/2022)   Hunger Vital Sign    Worried About Running Out of Food in the Last Year: Never true    Ran Out of Food in the Last Year: Never true  Transportation Needs: No Transportation Needs (12/13/2022)   PRAPARE - Transportation    Lack of  Transportation (Medical): No    Lack of Transportation (Non-Medical): No  Physical Activity: Sufficiently Active (12/13/2022)   Exercise Vital Sign    Days of Exercise per Week: 7 days    Minutes of Exercise per Session: 90 min  Stress: No Stress Concern Present (12/13/2022)   [employer] of Occupational Health - Occupational Stress Questionnaire    Feeling of Stress : Not at all  Social Connections: Socially Integrated (01/09/2022)   Social Connection and Isolation Panel [NHANES]    Frequency of Communication with Friends and Family: Three times a week    Frequency of Social Gatherings with Friends and Family: Three times a week    Attends Religious Services: More than 4 times per year    Active Member of Clubs or Organizations: Yes    Attends Banker Meetings: More than 4 times per year    Marital Status: Married  Catering manager Violence: Not At Risk (01/09/2022)   Humiliation, Afraid, Rape, and Kick questionnaire    Fear of Current or Ex-Partner: No    Emotionally Abused: No    Physically Abused: No    Sexually Abused: No    Family History  Problem Relation Age of Onset   Stroke Mother    Cancer Mother        Ovary, Breast   Heart attack Father 34   Heart attack Paternal Grandfather    Colon cancer Neg Hx    Prostate cancer Neg Hx    Diabetes Neg Hx    Esophageal cancer Neg Hx     Rectal cancer Neg Hx    Stomach cancer Neg Hx     ROS: no fevers or chills, productive cough, hemoptysis, dysphasia, odynophagia, melena, hematochezia, dysuria, hematuria, rash, seizure activity, orthopnea, PND, pedal edema, claudication. Remaining systems are negative.  Physical Exam: Well-developed well-nourished in no acute distress.  Skin is warm and dry.  HEENT is normal.  Neck is supple.  Chest is clear to auscultation with normal expansion.  Cardiovascular exam is regular rate and rhythm.  Abdominal exam nontender or distended. No masses palpated. Extremities show no edema; s/p left BKA neuro grossly intact   A/P  1 coronary disease-patient denies chest pain.  Continue aspirin and Plavix (will discontinue Plavix January 2025).  Continue statin.  2 hyperlipidemia-continue statin.  3 hypertension-blood pressure is controlled today.  Continue present medications and follow.  Olga Millers, MD

## 2023-04-04 ENCOUNTER — Ambulatory Visit: Payer: PPO | Attending: Cardiology | Admitting: Cardiology

## 2023-04-04 ENCOUNTER — Encounter: Payer: Self-pay | Admitting: Cardiology

## 2023-04-04 VITALS — BP 110/70 | HR 55 | Ht 65.0 in | Wt 193.4 lb

## 2023-04-04 DIAGNOSIS — I25118 Atherosclerotic heart disease of native coronary artery with other forms of angina pectoris: Secondary | ICD-10-CM

## 2023-04-04 DIAGNOSIS — E785 Hyperlipidemia, unspecified: Secondary | ICD-10-CM | POA: Diagnosis not present

## 2023-04-04 DIAGNOSIS — I10 Essential (primary) hypertension: Secondary | ICD-10-CM

## 2023-04-04 NOTE — Patient Instructions (Signed)

## 2023-04-08 ENCOUNTER — Encounter: Payer: Self-pay | Admitting: Family Medicine

## 2023-04-08 DIAGNOSIS — G4733 Obstructive sleep apnea (adult) (pediatric): Secondary | ICD-10-CM | POA: Diagnosis not present

## 2023-04-09 ENCOUNTER — Encounter: Payer: Self-pay | Admitting: Family Medicine

## 2023-04-09 ENCOUNTER — Telehealth (INDEPENDENT_AMBULATORY_CARE_PROVIDER_SITE_OTHER): Payer: PPO | Admitting: Family Medicine

## 2023-04-09 VITALS — Temp 97.5°F | Ht 65.0 in | Wt 197.0 lb

## 2023-04-09 DIAGNOSIS — U071 COVID-19: Secondary | ICD-10-CM | POA: Insufficient documentation

## 2023-04-09 MED ORDER — NIRMATRELVIR/RITONAVIR (PAXLOVID)TABLET
3.0000 | ORAL_TABLET | Freq: Two times a day (BID) | ORAL | 0 refills | Status: AC
Start: 2023-04-09 — End: 2023-04-14

## 2023-04-09 MED ORDER — AZELASTINE HCL 0.1 % NA SOLN: 1.0000 | Freq: Two times a day (BID) | NASAL | 12 refills | Status: DC

## 2023-04-09 NOTE — Assessment & Plan Note (Signed)
Reviewed home care instructions for COVID, including rest, pushing fluids, and OTC medications as needed for symptom relief. I will prescribe Paxlovid. He should hold his rosuvastatin while on this medicine. I will also add azelastine spray rather than using Claritin, as this could be the cause of his urinary retention. Advised self-isolation at home until 24 hours after fever resolved and symptoms improve. Continue to wear a mask around others for an additional 5 days. If symptoms, esp, dyspnea develops/worsens, recommend in-person evaluation at either an urgent care or the emergency room.

## 2023-04-09 NOTE — Progress Notes (Signed)
[facility] [address] [place] [address] Dept: [phone] Dept Fax: [phone]  Virtual Video Visit  I connected with Devin Martin on 04/09/23 at  2:00 PM EDT by a video enabled telemedicine application and verified that I am speaking with the correct person using two identifiers.  Location patient: Home Location provider: Clinic Persons participating in the virtual visit: Patient, Provider  I discussed the limitations of evaluation and management by telemedicine and the availability of in person appointments. The patient expressed understanding and agreed to proceed.  Chief Complaint  Patient presents with   Chills    C/o having chills, fatigue, sinus, aches x 2-3 days.   Has taken Claritin-D.     SUBJECTIVE:  HPI: Devin Martin is a 77 y.o. male who presents with a 2-3 day history of body aches, chills, fatigue, and nasal congestion with rhinorrhea. He has not had an elevated temperature. He did take some Claritin-D, but feels this caused some urinary retention.  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 01/24/2023   Angina pectoris (HCC) 08/30/2022   Obesity (BMI 30.0-34.9) 10/19/2021   Prediabetes 10/19/2021   Remote history of stroke 06/03/2019   Cholecystitis 01/16/2017   S/P BKA (below knee amputation) (HCC) 05/06/2016   Osteoarthritis of right knee 12/31/2013   Hypogonadism male 08/12/2011   GERD 04/05/2010   Essential hypertension 01/26/2009   Hyperlipidemia 06/23/2007   Obstructive sleep apnea 06/23/2007   Coronary artery disease involving native coronary artery of native heart with angina pectoris (HCC) 06/23/2007   History of kidney stones 06/17/2007   Past Surgical History:  Procedure Laterality Date   AMPUT TRAUM LEG BELOW KNEE UNILT W/O COMPL Left    CARDIAC CATHETERIZATION     CORONARY IMAGING/OCT N/A 08/30/2022   Procedure: INTRAVASCULAR  IMAGING/OCT;  Surgeon: Orbie Pyo, MD;  [facility];  Service: Cardiovascular;  Laterality: N/A;   CORONARY PRESSURE/FFR STUDY N/A 08/30/2022   Procedure: INTRAVASCULAR PRESSURE WIRE/FFR STUDY;  Surgeon: Orbie Pyo, MD;  [facility];  Service: Cardiovascular;  Laterality: N/A;   CORONARY STENT INTERVENTION N/A 08/30/2022   Procedure: CORONARY STENT INTERVENTION;  Surgeon: Orbie Pyo, MD;  [facility];  Service: Cardiovascular;  Laterality: N/A;   CYST REMOVAL TRUNK  09/04/2012   Procedure: CYST REMOVAL TRUNK;  Surgeon: Axel Filler, MD;  Location: [facility];  Service: General;  Laterality: N/A;  EXCISION OF MID BACK MASS   EYE SURGERY Left DR.SON   CATARACT SX1/2019   INCISION AND DRAINAGE ABSCESS Left 07/31/2013   Procedure: INCISIONAL/NON-INCISIONAL DEBRIDEMENT OF ABSCESS LEFT KNEE;  Surgeon: Shelda Pal, MD;  Location: [facility];  Service: Orthopedics;  Laterality: Left;   KIDNEY STONE SURGERY     multiple episodes    LEFT HEART CATH AND CORONARY ANGIOGRAPHY N/A 08/30/2022   Procedure: LEFT HEART CATH AND CORONARY ANGIOGRAPHY;  Surgeon: Orbie Pyo, MD;  [facility];  Service: Cardiovascular;  Laterality: N/A;   Right Knee Arthroscopy  2010   [facility]   TONSILLECTOMY     Family History  Problem Relation Age of Onset   Stroke Mother    Cancer Mother        Ovary, Breast   Heart attack Father 33   Heart attack Paternal Grandfather    Colon cancer Neg Hx    Prostate cancer Neg  Hx    Diabetes Neg Hx    Esophageal cancer Neg Hx    Rectal cancer Neg Hx    Stomach cancer Neg Hx    Social History   Tobacco Use   Smoking status: Former    Current packs/day: 0.00    Average packs/day: 0.8 packs/day for 20.0 years (15.0 ttl pk-yrs)    Types: Cigarettes, E-cigarettes    Start date: 97    Quit date: 2015    Years since quitting: 9.6   Smokeless tobacco: Never   Tobacco comments:     currently doing some e-cigarettes  Vaping Use   Vaping status: Every Day   Substances: Nicotine  Substance Use Topics   Alcohol use: Yes    Comment: bourbon 3 fingers 3 x/week.   Drug use: No    Current Outpatient Medications:    AMBULATORY NON FORMULARY MEDICATION, Prosthetic Leg Liners, Disp: 2 application, Rfl: 0   AMBULATORY NON FORMULARY MEDICATION, ResMed CPAP Machine, Disp: , Rfl:    Aspirin 81 MG EC tablet, Take 1 tablet (81 mg total) by mouth daily., Disp: , Rfl:    azelastine (ASTELIN) 0.1 % nasal spray, Place 1 spray into both nostrils 2 (two) times daily. Use in each nostril as directed, Disp: 30 mL, Rfl: 12   chlorthalidone (HYGROTON) 25 MG tablet, Take 0.5 tablets (12.5 mg total) by mouth daily., Disp: 45 tablet, Rfl: 3   Cholecalciferol (D3 ADULT PO), Take 1 tablet by mouth every evening., Disp: , Rfl:    clopidogrel (PLAVIX) 75 MG tablet, TAKE 1 TABLET(75 MG) BY MOUTH DAILY WITH BREAKFAST, Disp: 30 tablet, Rfl: 3   Cyanocobalamin (B-12 PO), Take 1 tablet by mouth every evening., Disp: , Rfl:    diltiazem (TIAZAC) 240 MG 24 hr capsule, Take 1 capsule (240 mg total) by mouth daily., Disp: 90 capsule, Rfl: 3   irbesartan (AVAPRO) 150 MG tablet, Take 0.5 tablets (75 mg total) by mouth daily., Disp: 90 tablet, Rfl: 3   isosorbide mononitrate (IMDUR) 60 MG 24 hr tablet, TAKE 1 AND 1/2 TABLETS(90 MG) BY MOUTH EVERY EVENING, Disp: 135 tablet, Rfl: 1   loratadine-pseudoephedrine (CLARITIN-D 24-HOUR) 10-240 MG 24 hr tablet, Take 1 tablet by mouth daily., Disp: , Rfl:    nirmatrelvir/ritonavir (PAXLOVID) 20 x 150 MG & 10 x 100MG  TABS, Take 3 tablets by mouth 2 (two) times daily for 5 days., Disp: 30 tablet, Rfl: 0   nitroGLYCERIN (NITROSTAT) 0.4 MG SL tablet, Place 1 tablet (0.4 mg total) under the tongue every 5 minutes as needed for chest pain., Disp: 25 tablet, Rfl: 2   Nitroglycerin 0.4 % OINT, Place 1 Application rectally in the morning and at bedtime. Apply 1 inch (375 mg)  ointment intra-anally every 12 hours for anal fissure, Disp: 30 g, Rfl: 1   potassium citrate (UROCIT-K) 10 MEQ (1080 MG) SR tablet, TAKE 2 TABLET BY MOUTH DAILY, Disp: 180 tablet, Rfl: 3   rosuvastatin (CRESTOR) 40 MG tablet, TAKE 1 TABLET(40 MG) BY MOUTH DAILY, Disp: 90 tablet, Rfl: 3   TURMERIC PO, Take 1 capsule by mouth daily., Disp: , Rfl:  Allergies  Allergen Reactions   Penicillins Hives and Other (See Comments)    syncope   Tylox [Oxycodone-Acetaminophen] Other (See Comments)    "messes me up"   Codeine Nausea Only   ROS: See pertinent positives and negatives per HPI.  OBSERVATIONS/OBJECTIVE:  VITALS per patient if applicable: Today's Vitals   04/09/23 1402  Temp: (!) 97.5 F (36.4  C)  TempSrc: Temporal  Weight: 197 lb (89.4 kg)  Height: 5\' 5"  (1.651 m)   Body mass index is 32.78 kg/m.   GENERAL: Alert and oriented. Appears well and in no acute distress. HEENT: Atraumatic. Eyes clear. No obvious abnormalities on inspection of external nose and ears. NECK: Normal movements of the head and neck. LUNGS: On inspection, no signs of respiratory distress. Breathing rate appears normal. No obvious gross SOB, gasping or wheezing, and no conversational dyspnea. CV: No obvious cyanosis. MS: Moves all visible extremities without noticeable abnormality. PSYCH/NEURO: Pleasant and cooperative. No obvious depression or anxiety. Speech and thought processing grossly intact.  ASSESSMENT AND PLAN:  Problem List Items Addressed This Visit       Other   COVID-19 - Primary    Reviewed home care instructions for COVID, including rest, pushing fluids, and OTC medications as needed for symptom relief. I will prescribe Paxlovid. He should hold his rosuvastatin while on this medicine. I will also add azelastine spray rather than using Claritin, as this could be the cause of his urinary retention. Advised self-isolation at home until 24 hours after fever resolved and symptoms improve.  Continue to wear a mask around others for an additional 5 days. If symptoms, esp, dyspnea develops/worsens, recommend in-person evaluation at either an urgent care or the emergency room.       Relevant Medications   azelastine (ASTELIN) 0.1 % nasal spray   nirmatrelvir/ritonavir (PAXLOVID) 20 x 150 MG & 10 x 100MG  TABS     I discussed the assessment and treatment plan with the patient. The patient was provided an opportunity to ask questions and all were answered. The patient agreed with the plan and demonstrated an understanding of the instructions.   The patient was advised to call back or seek an in-person evaluation if the symptoms worsen or if the condition fails to improve as anticipated.  No follow-ups on file.   Loyola Mast, MD

## 2023-04-11 ENCOUNTER — Encounter: Payer: Self-pay | Admitting: Family Medicine

## 2023-04-11 DIAGNOSIS — R399 Unspecified symptoms and signs involving the genitourinary system: Secondary | ICD-10-CM

## 2023-04-11 MED ORDER — TAMSULOSIN HCL 0.4 MG PO CAPS: 0.4000 mg | ORAL_CAPSULE | Freq: Every day | ORAL | 3 refills | Status: DC

## 2023-04-16 ENCOUNTER — Ambulatory Visit (INDEPENDENT_AMBULATORY_CARE_PROVIDER_SITE_OTHER): Payer: PPO | Admitting: Family Medicine

## 2023-04-16 ENCOUNTER — Encounter: Payer: Self-pay | Admitting: Family Medicine

## 2023-04-16 VITALS — BP 122/70 | HR 53 | Temp 98.1°F | Resp 16 | Ht 65.0 in | Wt 189.2 lb

## 2023-04-16 DIAGNOSIS — I25119 Atherosclerotic heart disease of native coronary artery with unspecified angina pectoris: Secondary | ICD-10-CM

## 2023-04-16 DIAGNOSIS — Z89512 Acquired absence of left leg below knee: Secondary | ICD-10-CM | POA: Diagnosis not present

## 2023-04-16 DIAGNOSIS — I10 Essential (primary) hypertension: Secondary | ICD-10-CM

## 2023-04-16 DIAGNOSIS — U071 COVID-19: Secondary | ICD-10-CM | POA: Diagnosis not present

## 2023-04-16 NOTE — Assessment & Plan Note (Signed)
Blood pressure is in good control. Continue diltiazem 240 mg daily, irbesartan 150 mg 1/2 tab daily, and chlorthalidone 25 mg 1/2 tab daily.

## 2023-04-16 NOTE — Progress Notes (Signed)
[facility] [address] [place] [address] Dept: [phone] Dept Fax: [phone]  Chronic Care Office Visit  Subjective:    Patient ID: Devin Martin, male    DOB: December 01, 1945, 77 y.o..   MRN: [id number]  Chief Complaint  Patient presents with   Medical Management of Chronic Issues    Fasting    History of Present Illness:  Patient is in today for reassessment of chronic medical issues.  Devin Martin recently had COVID. He notes that he is still having some lingering fatigue associated with this. He has been sleeping more than usual. He feels frustrated that this is impairing his exercise.  Devin Martin has a history of hypertension. He is currently managed on diltiazem 240 mg daily, irbesartan 75 mg daily, and chlorthalidone 12.5 mg daily.   Devin Martin has a history of a prior MI with stent placement and hyperlipidemia. He underwent a cardiac cath on 08/30/2022. This showed a proximal circumflex artery lesion that was 90% stenosed. He also had a  proximal LAD to mid LAD lesion 50% stenosed, and a LAD ostium to proximal LAD lesion 25% stenosed.  A stent was successfully placed and post intervention, there was a 0% residual stenosis. Heis managed on  aspirin 81 mg daily, clopidogrel 75 mg daily, isosorbide 60 mg 1 1/2 tabs daily, and rosuvastatin 40 mg daily.  He is feeling much better these days. He remains active at the gym daily.  Devin Martin has a history of a left above knee amputation. He notes he has an area over the medial distal femur that gets red and irritated. he feels he has a hair follicle in this area that is the source of the irritation.  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 01/24/2023   Angina pectoris (HCC) 08/30/2022   Obesity (BMI 30.0-34.9) 10/19/2021   Prediabetes 10/19/2021   Remote history of stroke 06/03/2019    Cholecystitis 01/16/2017   S/P BKA (below knee amputation) (HCC) 05/06/2016   Osteoarthritis of right knee 12/31/2013   Hypogonadism male 08/12/2011   GERD 04/05/2010   Essential hypertension 01/26/2009   Hyperlipidemia 06/23/2007   Obstructive sleep apnea 06/23/2007   Coronary artery disease involving native coronary artery of native heart with angina pectoris (HCC) 06/23/2007   History of kidney stones 06/17/2007   Past Surgical History:  Procedure Laterality Date   AMPUT TRAUM LEG BELOW KNEE UNILT W/O COMPL Left    CARDIAC CATHETERIZATION     CORONARY IMAGING/OCT N/A 08/30/2022   Procedure: INTRAVASCULAR IMAGING/OCT;  Surgeon: Orbie Pyo, MD;  [facility];  Service: Cardiovascular;  Laterality: N/A;   CORONARY PRESSURE/FFR STUDY N/A 08/30/2022   Procedure: INTRAVASCULAR PRESSURE WIRE/FFR STUDY;  Surgeon: Orbie Pyo, MD;  [facility]: [facility];  Service: Cardiovascular;  Laterality: N/A;   CORONARY STENT INTERVENTION N/A 08/30/2022   Procedure: CORONARY STENT INTERVENTION;  Surgeon: Orbie Pyo, MD;  [facility];  Service: Cardiovascular;  Laterality: N/A;   CYST REMOVAL TRUNK  09/04/2012   Procedure: CYST REMOVAL TRUNK;  Surgeon: Axel Filler, MD;  Location: [facility];  Service: General;  Laterality: N/A;  EXCISION OF MID BACK MASS   EYE SURGERY Left DR.SON   CATARACT SX1/2019   INCISION AND DRAINAGE ABSCESS Left 07/31/2013   Procedure: INCISIONAL/NON-INCISIONAL DEBRIDEMENT OF ABSCESS LEFT KNEE;  Surgeon: Molli Hazard  Rosalia Hammers, MD;  Location: [facility];  Service: Orthopedics;  Laterality: Left;   KIDNEY STONE SURGERY     multiple episodes    LEFT HEART CATH AND CORONARY ANGIOGRAPHY N/A 08/30/2022   Procedure: LEFT HEART CATH AND CORONARY ANGIOGRAPHY;  Surgeon: Orbie Pyo, MD;  [facility];  Service: Cardiovascular;  Laterality: N/A;   Right Knee Arthroscopy  2010   [facility]   TONSILLECTOMY      Family History  Problem Relation Age of Onset   Stroke Mother    Cancer Mother        Ovary, Breast   Heart attack Father 68   Heart attack Paternal Grandfather    Colon cancer Neg Hx    Prostate cancer Neg Hx    Diabetes Neg Hx    Esophageal cancer Neg Hx    Rectal cancer Neg Hx    Stomach cancer Neg Hx    Outpatient Medications Prior to Visit  Medication Sig Dispense Refill   AMBULATORY NON FORMULARY MEDICATION Prosthetic Leg Liners 2 application 0   AMBULATORY NON FORMULARY MEDICATION ResMed CPAP Machine     Aspirin 81 MG EC tablet Take 1 tablet (81 mg total) by mouth daily.     azelastine (ASTELIN) 0.1 % nasal spray Place 1 spray into both nostrils 2 (two) times daily. Use in each nostril as directed 30 mL 12   chlorthalidone (HYGROTON) 25 MG tablet Take 0.5 tablets (12.5 mg total) by mouth daily. 45 tablet 3   Cholecalciferol (D3 ADULT PO) Take 1 tablet by mouth every evening.     clopidogrel (PLAVIX) 75 MG tablet TAKE 1 TABLET(75 MG) BY MOUTH DAILY WITH BREAKFAST 30 tablet 3   Cyanocobalamin (B-12 PO) Take 1 tablet by mouth every evening.     diltiazem (TIAZAC) 240 MG 24 hr capsule Take 1 capsule (240 mg total) by mouth daily. 90 capsule 3   irbesartan (AVAPRO) 150 MG tablet Take 0.5 tablets (75 mg total) by mouth daily. 90 tablet 3   isosorbide mononitrate (IMDUR) 60 MG 24 hr tablet TAKE 1 AND 1/2 TABLETS(90 MG) BY MOUTH EVERY EVENING 135 tablet 1   nitroGLYCERIN (NITROSTAT) 0.4 MG SL tablet Place 1 tablet (0.4 mg total) under the tongue every 5 minutes as needed for chest pain. 25 tablet 2   Nitroglycerin 0.4 % OINT Place 1 Application rectally in the morning and at bedtime. Apply 1 inch (375 mg) ointment intra-anally every 12 hours for anal fissure 30 g 1   potassium citrate (UROCIT-K) 10 MEQ (1080 MG) SR tablet TAKE 2 TABLET BY MOUTH DAILY 180 tablet 3   rosuvastatin (CRESTOR) 40 MG tablet TAKE 1 TABLET(40 MG) BY MOUTH DAILY 90 tablet 3   tamsulosin (FLOMAX) 0.4 MG CAPS  capsule Take 1 capsule (0.4 mg total) by mouth daily. 30 capsule 3   TURMERIC PO Take 1 capsule by mouth daily.     loratadine-pseudoephedrine (CLARITIN-D 24-HOUR) 10-240 MG 24 hr tablet Take 1 tablet by mouth daily. (Patient not taking: Reported on 04/16/2023)     No facility-administered medications prior to visit.   Allergies  Allergen Reactions   Penicillins Hives and Other (See Comments)    syncope   Tylox [Oxycodone-Acetaminophen] Other (See Comments)    "messes me up"   Codeine Nausea Only   Objective:   Today's Vitals   04/16/23 0835  BP: 122/70  Pulse: (!) 53  Resp: 16  Temp: 98.1 F (36.7 C)  TempSrc: Temporal  SpO2: 94%  Weight: 189 lb 3.2 oz (85.8 kg)  Height: 5\' 5"  (1.651 m)   Body mass index is 31.48 kg/m.   General: Well developed, well nourished. No acute distress. Extremities: There is a 2 cm area of redness over the medial aspect of the left upper leg.   There is a central area of callus that I am able to peel out of the site. Psych: Alert and oriented. Normal mood and affect.  Health Maintenance Due  Topic Date Due   Zoster Vaccines- Shingrix (1 of 2) 11/14/1997     Assessment & Plan:   Problem List Items Addressed This Visit       Cardiovascular and Mediastinum   Coronary artery disease involving native coronary artery of native heart with angina pectoris (HCC)    Stent placement 08/2022. Feeling much improved. Continue aspirin 81 mg daily, isosorbide 60 mg 1 1/2 tabs daily, and clopidogrel 75 mg daily.      Essential hypertension - Primary    Blood pressure is in good control. Continue diltiazem 240 mg daily, irbesartan 150 mg 1/2 tab daily, and chlorthalidone 25 mg 1/2 tab daily.        Other   COVID-19    Improving. We did discuss fatigue as a lingering symptom of COVID. I reassured him that this should gradually improve.      S/P BKA (below knee amputation) (HCC)    There is an area that appears to get some rubbing. I recommend he try  mole skin or a Dr. Jearld Fenton pad over this site to relieve pressure.       Return in about 3 months (around 07/17/2023) for Reassessment.   Loyola Mast, MD

## 2023-04-16 NOTE — Assessment & Plan Note (Signed)
Improving. We did discuss fatigue as a lingering symptom of COVID. I reassured him that this should gradually improve.

## 2023-04-16 NOTE — Assessment & Plan Note (Signed)
Stent placement 08/2022. Feeling much improved. Continue aspirin 81 mg daily, isosorbide 60 mg 1 1/2 tabs daily, and clopidogrel 75 mg daily.

## 2023-04-16 NOTE — Assessment & Plan Note (Signed)
There is an area that appears to get some rubbing. I recommend he try mole skin or a Dr. Jearld Fenton pad over this site to relieve pressure.

## 2023-04-24 ENCOUNTER — Encounter: Payer: Self-pay | Admitting: Cardiology

## 2023-05-09 DIAGNOSIS — G4733 Obstructive sleep apnea (adult) (pediatric): Secondary | ICD-10-CM | POA: Diagnosis not present

## 2023-05-16 ENCOUNTER — Other Ambulatory Visit: Payer: Self-pay | Admitting: Cardiology

## 2023-06-08 DIAGNOSIS — G4733 Obstructive sleep apnea (adult) (pediatric): Secondary | ICD-10-CM | POA: Diagnosis not present

## 2023-06-10 ENCOUNTER — Telehealth: Payer: Self-pay | Admitting: Student-PharmD

## 2023-06-10 NOTE — Progress Notes (Unsigned)
This patient is appearing on a report for being at risk of failing the adherence measure for hypertension (ACEi/ARB) medications this calendar year.   Medication: Irbesartan 150mg  Last fill date: 03/22/2023 for 31 day supply  Left voicemail for patient to return my call at their convenience. Last fill in [facility] is 01/04/2023 for 90 d supply.  Pt did have medication for 301 days out of last 322 days since November 28th 2023. Possibility of overstock on hand but should need refill now. Left message to call back. Will try calling again another day this week.  Sharee Pimple   Called [facility] [phone] to ask about irbesartan Rx. Pharmacist said hasn't been filled since May 13th and Rx was closed out August 2nd as "patient reported they stopped taking it".   Sharee Pimple   3:50pm: Pt called me back and talked for 20 minutes about his BP history, having chest pain, getting stent for 80% blockage and feeling much better afterwards. Claims to be taking 1/2 tablet of Irbesartan daily but, in reality, very adamant about not wanting to take "all this medication" every day. Asked about normal BP values. Explained that he could keep an at home BP log and 130-135/80 would be a reasonable BP level for his age. Doesn't wish to continue to take daily medication. Says he exercises daily and feels fine. Explained the Dr isn't going to recommend he stop his medication. Sharee Pimple

## 2023-06-24 NOTE — Progress Notes (Deleted)
    Devin Martin D.[facility] [address] [place] [address] Phone: [phone]   Assessment and Plan:     There are no diagnoses linked to this encounter.  ***   Pertinent previous records reviewed include ***   Follow Up: ***     Subjective:    Chief Complaint: ***  HPI:   06/24/23 ***  Relevant Historical Information: ***  Additional pertinent review of systems negative.   Current Outpatient Medications:    AMBULATORY NON FORMULARY MEDICATION, Prosthetic Leg Liners, Disp: 2 application, Rfl: 0   AMBULATORY NON FORMULARY MEDICATION, ResMed CPAP Machine, Disp: , Rfl:    Aspirin 81 MG EC tablet, Take 1 tablet (81 mg total) by mouth daily., Disp: , Rfl:    azelastine (ASTELIN) 0.1 % nasal spray, Place 1 spray into both nostrils 2 (two) times daily. Use in each nostril as directed, Disp: 30 mL, Rfl: 12   chlorthalidone (HYGROTON) 25 MG tablet, Take 0.5 tablets (12.5 mg total) by mouth daily., Disp: 45 tablet, Rfl: 3   Cholecalciferol (D3 ADULT PO), Take 1 tablet by mouth every evening., Disp: , Rfl:    clopidogrel (PLAVIX) 75 MG tablet, TAKE 1 TABLET(75 MG) BY MOUTH DAILY WITH BREAKFAST, Disp: 90 tablet, Rfl: 3   Cyanocobalamin (B-12 PO), Take 1 tablet by mouth every evening., Disp: , Rfl:    diltiazem (TIAZAC) 240 MG 24 hr capsule, Take 1 capsule (240 mg total) by mouth daily., Disp: 90 capsule, Rfl: 3   irbesartan (AVAPRO) 150 MG tablet, Take 0.5 tablets (75 mg total) by mouth daily., Disp: 90 tablet, Rfl: 3   isosorbide mononitrate (IMDUR) 60 MG 24 hr tablet, TAKE 1 AND 1/2 TABLETS(90 MG) BY MOUTH EVERY EVENING, Disp: 135 tablet, Rfl: 1   nitroGLYCERIN (NITROSTAT) 0.4 MG SL tablet, Place 1 tablet (0.4 mg total) under the tongue every 5 minutes as needed for chest pain., Disp: 25 tablet, Rfl: 2   Nitroglycerin 0.4 % OINT, Place 1 Application rectally in the morning and at bedtime. Apply 1 inch (375 mg) ointment intra-anally every 12  hours for anal fissure, Disp: 30 g, Rfl: 1   potassium citrate (UROCIT-K) 10 MEQ (1080 MG) SR tablet, TAKE 2 TABLET BY MOUTH DAILY, Disp: 180 tablet, Rfl: 3   rosuvastatin (CRESTOR) 40 MG tablet, TAKE 1 TABLET(40 MG) BY MOUTH DAILY, Disp: 90 tablet, Rfl: 3   tamsulosin (FLOMAX) 0.4 MG CAPS capsule, Take 1 capsule (0.4 mg total) by mouth daily., Disp: 30 capsule, Rfl: 3   TURMERIC PO, Take 1 capsule by mouth daily., Disp: , Rfl:    Objective:     There were no vitals filed for this visit.    There is no height or weight on file to calculate BMI.    Physical Exam:    ***   Electronically signed by:  Devin Martin D.[facility] 4:30 PM 06/24/23

## 2023-06-25 ENCOUNTER — Encounter: Payer: Self-pay | Admitting: Family Medicine

## 2023-06-25 ENCOUNTER — Ambulatory Visit (INDEPENDENT_AMBULATORY_CARE_PROVIDER_SITE_OTHER): Payer: PPO | Admitting: Family Medicine

## 2023-06-25 ENCOUNTER — Ambulatory Visit: Payer: PPO | Admitting: Sports Medicine

## 2023-06-25 ENCOUNTER — Telehealth: Payer: Self-pay | Admitting: Cardiology

## 2023-06-25 VITALS — BP 120/66 | HR 64 | Temp 98.3°F | Ht 65.0 in | Wt 194.2 lb

## 2023-06-25 DIAGNOSIS — I4510 Unspecified right bundle-branch block: Secondary | ICD-10-CM | POA: Diagnosis not present

## 2023-06-25 DIAGNOSIS — R0609 Other forms of dyspnea: Secondary | ICD-10-CM | POA: Diagnosis not present

## 2023-06-25 DIAGNOSIS — I25119 Atherosclerotic heart disease of native coronary artery with unspecified angina pectoris: Secondary | ICD-10-CM

## 2023-06-25 NOTE — Telephone Encounter (Signed)
Pt is requesting a callback from nurse Stanton Kidney regarding his meeting with his pcp. Please advise

## 2023-06-25 NOTE — Progress Notes (Signed)
[facility] [address] [place] [address] Dept: [phone] Dept Fax: [phone]  Office Visit  Subjective:    Patient ID: Devin Martin, male    DOB: 1946-05-18, 77 y.o..   MRN: [id number]  Chief Complaint  Patient presents with   Shortness of Breath    C/o having SOB x 1 day after getting the flu and RSV shot .   Losing height in last year.    History of Present Illness:  Patient is in today complaining of a 1-2 day history of dyspnea on exertion. Devin Martin has a history of coronary artery disease with a two prior MIs, and several cardiac stents. His last cardiac cath was in Jan. 2024. It showed the stent in his proximal LAD had moderate in-stent restenosis (50%) with normal FFR, and a 90% proximal circumflex lesion treated with drug-eluting stent. Devin Martin notes he tried playing golf this weekend and noted he got short of breath after taking his first swing and with walking back to his cart. This continued to bother him throughout playing 9 holes of golf. Again this morning, he felt short of breath when he tried to do his usual elliptical exercises. He has not had any acute right ankle swelling. He denies any chest pain.  Devin Martin also raised a concern about a recent need to hem his long pants by 1 1/2". He thinks he may have lost some height from his waist down. As he has a prosthesis on the left, he does realize that this would not have changed in length.  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 tape 01/24/2023   Angina pectoris (HCC) 08/30/2022   Obesity (BMI 30.0-34.9) 10/19/2021   Prediabetes 10/19/2021   Remote history of stroke 06/03/2019   Cholecystitis 01/16/2017   S/P BKA (below knee amputation) (HCC) 05/06/2016   Osteoarthritis of right knee 12/31/2013   Hypogonadism male  08/12/2011   GERD 04/05/2010   Essential hypertension 01/26/2009   Hyperlipidemia 06/23/2007   Obstructive sleep apnea 06/23/2007   Coronary artery disease involving native coronary artery of native heart with angina pectoris (HCC) 06/23/2007   History of kidney stones 06/17/2007   Past Surgical History:  Procedure Laterality Date   AMPUT TRAUM LEG BELOW KNEE UNILT W/O COMPL Left    CARDIAC CATHETERIZATION     CORONARY IMAGING/OCT N/A 08/30/2022   Procedure: INTRAVASCULAR IMAGING/OCT;  Surgeon: Orbie Pyo, MD;  [facility];  Service: Cardiovascular;  Laterality: N/A;   CORONARY PRESSURE/FFR STUDY N/A 08/30/2022   Procedure: INTRAVASCULAR PRESSURE WIRE/FFR STUDY;  Surgeon: Orbie Pyo, MD;  [facility]: [facility];  Service: Cardiovascular;  Laterality: N/A;   CORONARY STENT INTERVENTION N/A 08/30/2022   Procedure: CORONARY STENT INTERVENTION;  Surgeon: Orbie Pyo, MD;  [facility]: [facility];  Service: Cardiovascular;  Laterality: N/A;   CYST REMOVAL TRUNK  09/04/2012   Procedure: CYST REMOVAL TRUNK;  Surgeon: Axel Filler, MD;  Location: [facility];  Service: General;  Laterality: N/A;  EXCISION OF MID BACK MASS   EYE SURGERY Left DR.SON   CATARACT SX1/2019   INCISION AND DRAINAGE ABSCESS Left 07/31/2013   Procedure: INCISIONAL/NON-INCISIONAL DEBRIDEMENT OF ABSCESS LEFT KNEE;  Surgeon: Shelda Pal, MD;  Location: [facility];  Service: Orthopedics;  Laterality: Left;   KIDNEY STONE SURGERY  multiple episodes    LEFT HEART CATH AND CORONARY ANGIOGRAPHY N/A 08/30/2022   Procedure: LEFT HEART CATH AND CORONARY ANGIOGRAPHY;  Surgeon: Orbie Pyo, MD;  [facility];  Service: Cardiovascular;  Laterality: N/A;   Right Knee Arthroscopy  2010   [facility] [facility]   TONSILLECTOMY     Family History  Problem Relation Age of Onset   Stroke Mother    Cancer Mother        Ovary, Breast   Heart attack Father 70   Heart attack  Paternal Grandfather    Colon cancer Neg Hx    Prostate cancer Neg Hx    Diabetes Neg Hx    Esophageal cancer Neg Hx    Rectal cancer Neg Hx    Stomach cancer Neg Hx    Outpatient Medications Prior to Visit  Medication Sig Dispense Refill   AMBULATORY NON FORMULARY MEDICATION Prosthetic Leg Liners 2 application 0   AMBULATORY NON FORMULARY MEDICATION ResMed CPAP Machine     Aspirin 81 MG EC tablet Take 1 tablet (81 mg total) by mouth daily.     azelastine (ASTELIN) 0.1 % nasal spray Place 1 spray into both nostrils 2 (two) times daily. Use in each nostril as directed 30 mL 12   chlorthalidone (HYGROTON) 25 MG tablet Take 0.5 tablets (12.5 mg total) by mouth daily. 45 tablet 3   Cholecalciferol (D3 ADULT PO) Take 1 tablet by mouth every evening.     clopidogrel (PLAVIX) 75 MG tablet TAKE 1 TABLET(75 MG) BY MOUTH DAILY WITH BREAKFAST 90 tablet 3   Cyanocobalamin (B-12 PO) Take 1 tablet by mouth every evening.     diltiazem (TIAZAC) 240 MG 24 hr capsule Take 1 capsule (240 mg total) by mouth daily. 90 capsule 3   irbesartan (AVAPRO) 150 MG tablet Take 0.5 tablets (75 mg total) by mouth daily. 90 tablet 3   isosorbide mononitrate (IMDUR) 60 MG 24 hr tablet TAKE 1 AND 1/2 TABLETS(90 MG) BY MOUTH EVERY EVENING 135 tablet 1   nitroGLYCERIN (NITROSTAT) 0.4 MG SL tablet Place 1 tablet (0.4 mg total) under the tongue every 5 minutes as needed for chest pain. 25 tablet 2   Nitroglycerin 0.4 % OINT Place 1 Application rectally in the morning and at bedtime. Apply 1 inch (375 mg) ointment intra-anally every 12 hours for anal fissure 30 g 1   potassium citrate (UROCIT-K) 10 MEQ (1080 MG) SR tablet TAKE 2 TABLET BY MOUTH DAILY 180 tablet 3   rosuvastatin (CRESTOR) 40 MG tablet TAKE 1 TABLET(40 MG) BY MOUTH DAILY 90 tablet 3   tamsulosin (FLOMAX) 0.4 MG CAPS capsule Take 1 capsule (0.4 mg total) by mouth daily. 30 capsule 3   TURMERIC PO Take 1 capsule by mouth daily.     No facility-administered  medications prior to visit.   Allergies  Allergen Reactions   Penicillins Hives and Other (See Comments)    syncope   Tylox [Oxycodone-Acetaminophen] Other (See Comments)    "messes me up"   Codeine Nausea Only     Objective:   Today's Vitals   06/25/23 0834  BP: 120/66  Pulse: 64  Temp: 98.3 F (36.8 C)  TempSrc: Temporal  SpO2: 97%  Weight: 194 lb 3.2 oz (88.1 kg)  Height: 5\' 5"  (1.651 m)   Body mass index is 32.32 kg/m.   General: Well developed, well nourished. No acute distress. Lungs: Clear to auscultation bilaterally. No wheezing, rales or rhonchi. CV: There are some  irregular heart beats hear without murmurs or rubs. Pulses 2+ with occasional skip beats. Psych: Alert and oriented. Normal mood and affect.  Health Maintenance Due  Topic Date Due   Zoster Vaccines- Shingrix (1 of 2) 11/14/1997   EKG: Compared to EKG 08/30/2022. Normal sinus rhythm. There is a new finding of a right bundle branch block    Assessment & Plan:  1. Dyspnea on exertion 2. Right bundle branch block 3. Coronary artery disease involving native coronary artery of native heart with angina pectoris [facility])  The new onset of dyspnea is concerning in light of Mr. Thormahlen extensive cardiac history. His EKG shows a new right bundle branch block. I recommend he be seen more urgently by Dr. Jens Som (or other cardiologist) to evaluate. He may need an urgent echocardiogram and/or a repeat heart cath.  - EKG 12-Lead - Ambulatory referral to Cardiology  We did discuss his concern over loss of height to his lower body. I am not aware of any particular health condition that would have caused this. I recommend expectant management for now.  Return for Follow-up as scheduled.   Loyola Mast, MD

## 2023-06-25 NOTE — Telephone Encounter (Signed)
Spoke with patient and he states while exercising he was experiencing SOB and was very fatigue. Went and saw PCP.  EKG showed right bundle branch block. Denies any chest pain. Stated his pcp advised him to follow up with cardiologist ASAP. Have him scheduled with APP.

## 2023-06-26 NOTE — Progress Notes (Addendum)
Cardiology Clinic Note   Patient Name: Devin Martin Date of Encounter: 06/27/2023  Primary Care Provider:  Loyola Mast, MD Primary Cardiologist:  Olga Millers, MD  Patient Profile    Devin Martin 77 year old male presents the clinic today for follow-up evaluation of his coronary artery disease and hyperlipidemia.  Past Medical History    Past Medical History:  Diagnosis Date   ANXIETY 10/13/2009   Qualifier: Diagnosis of  By: Jens Som, MD, Lyn Hollingshead    Arthritis 1968   pt wears a lt bk prosthesis   Bronchitis, chronic (HCC)    CAD (coronary artery disease)    Non-ST segment elevation myocardial infarction in 2005 with drug-eluting stent to the left anterior descending. 08-2009 - non-ST segment elevation myocardial infarction, felt secondary to vasospasm.   Calculus, kidney    hx   Complication of anesthesia    itching after preop med  ?name - 2013, no SOB   Glaucoma    Hyperlipidemia    Hypertension    Myocardial infarction [facility] 2005   x2, 2005, 2010   OSA (obstructive sleep apnea)    AHI 68/hr in 2002-uses a cpap   Pneumonia    hx of    Sleep apnea    wears CPAP   Wears glasses    Past Surgical History:  Procedure Laterality Date   AMPUT TRAUM LEG BELOW KNEE UNILT W/O COMPL Left    CARDIAC CATHETERIZATION     CORONARY IMAGING/OCT N/A 08/30/2022   Procedure: INTRAVASCULAR IMAGING/OCT;  Surgeon: Orbie Pyo, MD;  [facility];  Service: Cardiovascular;  Laterality: N/A;   CORONARY PRESSURE/FFR STUDY N/A 08/30/2022   Procedure: INTRAVASCULAR PRESSURE WIRE/FFR STUDY;  Surgeon: Orbie Pyo, MD;  [facility];  Service: Cardiovascular;  Laterality: N/A;   CORONARY STENT INTERVENTION N/A 08/30/2022   Procedure: CORONARY STENT INTERVENTION;  Surgeon: Orbie Pyo, MD;  [facility];  Service: Cardiovascular;  Laterality: N/A;   CYST REMOVAL TRUNK  09/04/2012   Procedure: CYST REMOVAL TRUNK;   Surgeon: Axel Filler, MD;  Location: [facility];  Service: General;  Laterality: N/A;  EXCISION OF MID BACK MASS   EYE SURGERY Left DR.SON   CATARACT SX1/2019   INCISION AND DRAINAGE ABSCESS Left 07/31/2013   Procedure: INCISIONAL/NON-INCISIONAL DEBRIDEMENT OF ABSCESS LEFT KNEE;  Surgeon: Shelda Pal, MD;  Location: [facility];  Service: Orthopedics;  Laterality: Left;   KIDNEY STONE SURGERY     multiple episodes    LEFT HEART CATH AND CORONARY ANGIOGRAPHY N/A 08/30/2022   Procedure: LEFT HEART CATH AND CORONARY ANGIOGRAPHY;  Surgeon: Orbie Pyo, MD;  [facility];  Service: Cardiovascular;  Laterality: N/A;   Right Knee Arthroscopy  2010   [facility] [facility]   TONSILLECTOMY      Allergies  Allergies  Allergen Reactions   Penicillins Hives and Other (See Comments)    syncope   Tylox [Oxycodone-Acetaminophen] Other (See Comments)    "messes me up"   Codeine Nausea Only    History of Present Illness    Devin Martin has a PMH of coronary artery disease, hypertension, and hyperlipidemia.  He underwent LHC December 2005 in the setting of NSTEMI.  His EF was noted to be 60%.  He had successful PCI to his mid LAD with DES.  He did have jailing of his 2 small diagonal branches at that time.  He underwent subsequent  cardiac catheterization 1/11 following MI.  He was noted to have an EF of 55-60%, his LAD stent was patent.  Both diagonals were noted to have 90% ostial stenosis, 25-30% circumflex, 25-30% RCA, and he was noted to have intensive vasospasm.  His infarct was felt to be related to spasm.  He underwent abdominal ultrasound 2/17 which showed no aneurysm.  He had repeat catheterization 1/24 due to exertional chest discomfort.  His proximal LAD was noted to have moderate in-stent restenosis with normal FFR, 90% proximal circumflex lesion was treated with DES.  He was seen in follow-up by Dr. Jens Som 04/04/2023.  During that time he denied dyspnea on exertion,  lower extremity swelling , PND, orthopnea, chest discomfort, syncope, and palpitations.  Recommendations for discontinuing Plavix 1/25 were given.  His blood pressure was well-controlled.  He presents to the clinic today for follow-up evaluation and states he typically is very physically active doing his elliptical daily, golfing, and staying very physically active.  He presents with his daughter.  He notes that over the last few days he has had significantly increased shortness of breath.  He received his flu vaccination and RSV vaccination on Saturday, Sunday he was unable to do normal daily activities due to fatigue.  Monday he continued to be very tired.  Tuesday he attempted to play golf.  He was very short of breath after doing practice swings and warming up at his first tee box.  He continues to be very short of breath with activity such as pulling his trash cans out.  His EKG today showed ST changes in V1 through V4 and a new right bundle branch block.  We reviewed his previous cardiac catheterizations.  He expressed understanding.  I reviewed EKG with DOD.  I will order nuclear stress test and echocardiogram as well as CBC and BMP.  Will plan follow-up after testing.  Today he denies chest pain,  lower extremity edema, fatigue, palpitations, melena, hematuria, hemoptysis, diaphoresis, weakness, presyncope, syncope, orthopnea, and PND.   Home Medications    Prior to Admission medications   Medication Sig Start Date End Date Taking? Authorizing Provider  AMBULATORY NON FORMULARY MEDICATION Prosthetic Leg Liners 10/12/18   Wanda Plump, MD  AMBULATORY NON FORMULARY MEDICATION ResMed CPAP Machine    [provider]  Aspirin 81 MG EC tablet Take 1 tablet (81 mg total) by mouth daily. 08/30/22   Marjie Skiff E, PA-C  azelastine (ASTELIN) 0.1 % nasal spray Place 1 spray into both nostrils 2 (two) times daily. Use in each nostril as directed 04/09/23   Loyola Mast, MD  chlorthalidone  (HYGROTON) 25 MG tablet Take 0.5 tablets (12.5 mg total) by mouth daily. 01/13/23 03/08/24  Loyola Mast, MD  Cholecalciferol (D3 ADULT PO) Take 1 tablet by mouth every evening.    [provider]  clopidogrel (PLAVIX) 75 MG tablet TAKE 1 TABLET(75 MG) BY MOUTH DAILY WITH BREAKFAST 05/16/23   Lewayne Bunting, MD  Cyanocobalamin (B-12 PO) Take 1 tablet by mouth every evening.    [provider]  diltiazem (TIAZAC) 240 MG 24 hr capsule Take 1 capsule (240 mg total) by mouth daily. 08/29/22   Lewayne Bunting, MD  irbesartan (AVAPRO) 150 MG tablet Take 0.5 tablets (75 mg total) by mouth daily. 07/30/22   Loyola Mast, MD  isosorbide mononitrate (IMDUR) 60 MG 24 hr tablet TAKE 1 AND 1/2 TABLETS(90 MG) BY MOUTH EVERY EVENING 03/25/23   Lewayne Bunting, MD  nitroGLYCERIN (NITROSTAT) 0.4 MG SL tablet Place 1 tablet (0.4 mg total) under the tongue every 5 minutes as needed for chest pain. 08/30/22   Corrin Parker, PA-C  Nitroglycerin 0.4 % OINT Place 1 Application rectally in the morning and at bedtime. Apply 1 inch (375 mg) ointment intra-anally every 12 hours for anal fissure 09/09/22   Hilarie Fredrickson, MD  potassium citrate (UROCIT-K) 10 MEQ (1080 MG) SR tablet TAKE 2 TABLET BY MOUTH DAILY 02/21/23   Loyola Mast, MD  rosuvastatin (CRESTOR) 40 MG tablet TAKE 1 TABLET(40 MG) BY MOUTH DAILY 08/29/22   Lewayne Bunting, MD  tamsulosin (FLOMAX) 0.4 MG CAPS capsule Take 1 capsule (0.4 mg total) by mouth daily. 04/11/23   Loyola Mast, MD  TURMERIC PO Take 1 capsule by mouth daily.    [provider]    Family History    Family History  Problem Relation Age of Onset   Stroke Mother    Cancer Mother        Ovary, Breast   Heart attack Father 54   Heart attack Paternal Grandfather    Colon cancer Neg Hx    Prostate cancer Neg Hx    Diabetes Neg Hx    Esophageal cancer Neg Hx    Rectal cancer Neg Hx    Stomach cancer Neg Hx    He indicated that his mother is alive.  He indicated that his father is deceased. He indicated that both of his sisters are alive. He indicated that his maternal grandmother is deceased. He indicated that his maternal grandfather is deceased. He indicated that his paternal grandmother is deceased. He indicated that his paternal grandfather is deceased. He indicated that the status of his neg hx is unknown.  Social History    Social History   Socioeconomic History   Marital status: Married    Spouse name: Not on file   Number of children: 2   Years of education: Not on file   Highest education level: Not on file  Occupational History   Occupation: Art gallery manager-- RETIRED 2012  Tobacco Use   Smoking status: Former    Current packs/day: 0.00    Average packs/day: 0.8 packs/day for 20.0 years (15.0 ttl pk-yrs)    Types: Cigarettes, E-cigarettes    Start date: 1995    Quit date: 2015    Years since quitting: 9.8   Smokeless tobacco: Never   Tobacco comments:    currently doing some e-cigarettes  Vaping Use   Vaping status: Every Day   Substances: Nicotine  Substance and Sexual Activity   Alcohol use: Yes    Comment: bourbon 3 fingers 3 x/week.   Drug use: No   Sexual activity: Not Currently  Other Topics Concern   Not on file  Social History Narrative   Lives w/ wife    Social Determinants of Health   Financial Resource Strain: Low Risk  (12/13/2022)   Overall Financial Resource Strain (CARDIA)    Difficulty of Paying Living Expenses: Not hard at all  Food Insecurity: No Food Insecurity (12/13/2022)   Hunger Vital Sign    Worried About Running Out of Food in the Last Year: Never true    Ran Out of Food in the Last Year: Never true  Transportation Needs: No Transportation Needs (12/13/2022)   PRAPARE - Administrator, Civil Service (Medical): No    Lack of Transportation (Non-Medical): No  Physical Activity: Sufficiently Active (12/13/2022)  Exercise Vital Sign    Days of Exercise per Week: 7  days    Minutes of Exercise per Session: 90 min  Stress: No Stress Concern Present (12/13/2022)   [employer] of Occupational Health - Occupational Stress Questionnaire    Feeling of Stress : Not at all  Social Connections: Socially Integrated (01/09/2022)   Social Connection and Isolation Panel [NHANES]    Frequency of Communication with Friends and Family: Three times a week    Frequency of Social Gatherings with Friends and Family: Three times a week    Attends Religious Services: More than 4 times per year    Active Member of Clubs or Organizations: Yes    Attends Banker Meetings: More than 4 times per year    Marital Status: Married  Catering manager Violence: Not At Risk (01/09/2022)   Humiliation, Afraid, Rape, and Kick questionnaire    Fear of Current or Ex-Partner: No    Emotionally Abused: No    Physically Abused: No    Sexually Abused: No     Review of Systems    General:  No chills, fever, night sweats or weight changes.  Cardiovascular:  No chest pain, dyspnea on exertion, edema, orthopnea, palpitations, paroxysmal nocturnal dyspnea. Dermatological: No rash, lesions/masses Respiratory: No cough, dyspnea Urologic: No hematuria, dysuria Abdominal:   No nausea, vomiting, diarrhea, bright red blood per rectum, melena, or hematemesis Neurologic:  No visual changes, wkns, changes in mental status. All other systems reviewed and are otherwise negative except as noted above.  Physical Exam    VS:  BP (!) 140/72   Pulse 75   Ht 5\' 4"  (1.626 m)   Wt 193 lb 6.4 oz (87.7 kg)   SpO2 96%   BMI 33.20 kg/m  , BMI Body mass index is 33.2 kg/m. GEN: Well nourished, well developed, in no acute distress. HEENT: normal. Neck: Supple, no JVD, carotid bruits, or masses. Cardiac: RRR, no murmurs, rubs, or gallops. No clubbing, cyanosis, edema.  Radials/DP/PT 2+ and equal bilaterally.  Respiratory:  Respirations regular and unlabored, clear to auscultation  bilaterally. GI: Soft, nontender, nondistended, BS + x 4. MS: no deformity or atrophy. Skin: warm and dry, no rash. Neuro:  Strength and sensation are intact. Psych: Normal affect.  Accessory Clinical Findings    Recent Labs: 08/28/2022: Hemoglobin 15.6; Platelets 272 10/01/2022: ALT 18 01/13/2023: BUN 19; Creatinine, Ser 0.95; Potassium 4.7; Sodium 139   Recent Lipid Panel    Component Value Date/Time   CHOL 113 10/01/2022 0821   TRIG 50 10/01/2022 0821   HDL 64 10/01/2022 0821   CHOLHDL 1.8 10/01/2022 0821   CHOLHDL 2 10/19/2021 0908   VLDL 12.4 10/19/2021 0908   LDLCALC 37 10/01/2022 0821   LDLDIRECT 150.1 06/24/2007 0900    HYPERTENSION CONTROL Vitals:   06/27/23 1502 06/27/23 1600  BP: (!) 146/70 (!) 140/72    The patient's blood pressure is elevated above target today.  In order to address the patient's elevated BP: Blood pressure will be monitored at home to determine if medication changes need to be made.       ECG personally reviewed by me today- EKG Interpretation Date/Time:  Friday June 27 2023 15:13:08 EDT Ventricular Rate:  75 PR Interval:  166 QRS Duration:  136 QT Interval:  422 QTC Calculation: 471 R Axis:   47  Text Interpretation: Normal sinus rhythm Right bundle branch block Septal infarct , age undetermined T wave abnormality, consider inferolateral ischemia When compared  with ECG of 30-Aug-2022 11:19, Premature ventricular complexes are no longer Present Right bundle branch block is now Present Septal infarct is now Present Confirmed by Edd Fabian [phone]) on 06/27/2023 4:51:03 PM    Cardiac catheterization 08/30/2022    Prox Cx lesion is 90% stenosed.   Prox LAD to Mid LAD lesion is 50% stenosed.   Ost LAD to Prox LAD lesion is 25% stenosed.   A stent was successfully placed.   Post intervention, there is a 0% residual stenosis.   1.  Patent proximal LAD stent with moderate in-stent restenosis with an RFR of 0.92; intervention was  therefore deferred. 2.  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 3.  LVEDP of 20 mmHg   Recommendation: Due to ongoing chest discomfort likely due to arterial stretch we will monitor the patient overnight with plan discharge in the morning.  Plan on preferably 6 months of dual antiplatelet therapy and then Plavix monotherapy thereafter.  Diagnostic Dominance: Right  Intervention       Assessment & Plan   1.   Coronary artery disease-last cardiac catheterization 1/24 in the setting of exertional chest discomfort.  He was noted to have proximal LAD moderate in-stent restenosis with normal FFR.  A 90% proximal circumflex lesion was treated with DES.  Has noticed increased shortness of breath and fatigue since having RSV and flu vaccinations. Continue aspirin, Imdur, Plavix-plan to discontinue Plavix 1/25 Maintain physical activity Heart healthy low-sodium diet Order nuclear stress test, echocardiogram CBC, BMP  Addendum to previous note: Nuclear stress testing (07/07/2023) reviewed with Dr. Jens Som.  He was noted to have an intermediate risk study with evidence of ischemia and a large defect with severe reduction in uptake along the apical-basal septal and apex which was partially reversible.  I contacted patient and reviewed results.  He agrees to proceed with LHC.  Essential hypertension-BP today 140/72. Continue chlorthalidone, Imdur, diltiazem Maintain blood pressure log  Hyperlipidemia-LDL 37 on 10/01/2022. Continue rosuvastatin, aspirin High-fiber diet Increase physical activity as tolerated  Disposition: Follow-up with Dr. Jens Som or me in 9-12 months.   Thomasene Ripple. Drena Ham NP-C     06/27/2023, 4:51 PM [facility] [address] Office [phone] Fax [phone]    I spent 15 minutes examining this patient, reviewing medications, and using patient centered  shared decision making involving her cardiac care.   I spent greater than 20 minutes reviewing her past medical history,  medications, and prior cardiac tests.

## 2023-06-27 ENCOUNTER — Encounter: Payer: Self-pay | Admitting: General Practice

## 2023-06-27 ENCOUNTER — Encounter: Payer: Self-pay | Admitting: Cardiology

## 2023-06-27 ENCOUNTER — Ambulatory Visit: Payer: PPO | Attending: General Practice | Admitting: General Practice

## 2023-06-27 VITALS — BP 140/72 | HR 75 | Ht 64.0 in | Wt 193.4 lb

## 2023-06-27 DIAGNOSIS — I10 Essential (primary) hypertension: Secondary | ICD-10-CM | POA: Diagnosis not present

## 2023-06-27 DIAGNOSIS — I25118 Atherosclerotic heart disease of native coronary artery with other forms of angina pectoris: Secondary | ICD-10-CM

## 2023-06-27 DIAGNOSIS — E785 Hyperlipidemia, unspecified: Secondary | ICD-10-CM | POA: Diagnosis not present

## 2023-06-27 DIAGNOSIS — R0602 Shortness of breath: Secondary | ICD-10-CM

## 2023-06-27 NOTE — Progress Notes (Addendum)
Appears to be related to anxiety.  Will continue to monitor at this time.  Continue current medication regimen

## 2023-06-27 NOTE — Patient Instructions (Signed)
Medication Instructions:  The current medical regimen is effective;  continue present plan and medications as directed. Please refer to the Current Medication list given to you today.  *If you need a refill on your cardiac medications before your next appointment, please call your pharmacy*  Lab Work: CBC AND BMET TODAY If you have labs (blood work) drawn today and your tests are completely normal, you will receive your results only by: MyChart Message (if you have MyChart) OR A paper copy in the mail If you have any lab test that is abnormal or we need to change your treatment, we will call you to review the results.  Testing/Procedures: Your physician has requested that you have an echocardiogram-NEXT WEEK PLEASE. Echocardiography is a painless test that uses sound waves to create images of your heart. It provides your doctor with information about the size and shape of your heart and how well your heart's chambers and valves are working. This procedure takes approximately one hour. There are no restrictions for this procedure. Please do NOT wear cologne, perfume, aftershave, or lotions (deodorant is allowed). Please arrive 15 minutes prior to your appointment time.  Please note: We ask at that you not bring children with you during ultrasound (echo/ vascular) testing. Due to room size and safety concerns, children are not allowed in the ultrasound rooms during exams. Our front office staff cannot provide observation of children in our lobby area while testing is being conducted. An adult accompanying a patient to their appointment will only be allowed in the ultrasound room at the discretion of the ultrasound technician under special circumstances. We apologize for any inconvenience.   Your physician has requested that you have a lexiscan Myoview-NEXT WEEK PLEASE. For further information please visit [email or web address] Please follow instruction sheet, as given.   Follow-Up: At [facility], you and your health needs are our priority.  As part of our continuing mission to provide you with exceptional heart care, we have created designated Provider Care Teams.  These Care Teams include your primary Cardiologist (physician) and Advanced Practice Providers (APPs -  Physician Assistants and Nurse Practitioners) who all work together to provide you with the care you need, when you need it.  We recommend signing up for the patient portal called "MyChart".  Sign up information is provided on this After Visit Summary.  MyChart is used to connect with patients for Virtual Visits (Telemedicine).  Patients are able to view lab/test results, encounter notes, upcoming appointments, etc.  Non-urgent messages can be sent to your provider as well.   To learn more about what you can do with MyChart, go to [email or web address].    Your next appointment:   AFTER TESTING   Provider:   Olga Millers, MD  or Edd Fabian, FNP       Other Instructions

## 2023-06-28 LAB — BASIC METABOLIC PANEL
BUN/Creatinine Ratio: 19 (ref 10–24)
BUN: 21 mg/dL (ref 8–27)
CO2: 26 mmol/L (ref 20–29)
Calcium: 9.8 mg/dL (ref 8.6–10.2)
Chloride: 98 mmol/L (ref 96–106)
Creatinine, Ser: 1.11 mg/dL (ref 0.76–1.27)
Glucose: 102 mg/dL — ABNORMAL HIGH (ref 70–99)
Potassium: 4.7 mmol/L (ref 3.5–5.2)
Sodium: 140 mmol/L (ref 134–144)
eGFR: 69 mL/min/{1.73_m2} (ref >59)

## 2023-06-28 LAB — CBC
Hematocrit: 44.4 % (ref 37.5–51.0)
Hemoglobin: 14.7 g/dL (ref 13.0–17.7)
MCH: 31.5 pg (ref 26.6–33.0)
MCHC: 33.1 g/dL (ref 31.5–35.7)
MCV: 95 fL (ref 79–97)
Platelets: 299 10*3/uL (ref 150–450)
RBC: 4.67 x10E6/uL (ref 4.14–5.80)
RDW: 12.1 % (ref 11.6–15.4)
WBC: 5.9 10*3/uL (ref 3.4–10.8)

## 2023-06-30 ENCOUNTER — Encounter: Payer: Self-pay | Admitting: Cardiology

## 2023-06-30 NOTE — Telephone Encounter (Signed)
Please contact Mr. Simien and let him know that I have reviewed his question.  I would like him to continue to take it easy with just light physical activity until testing can be completed.  If his symptoms become worse I would recommend presenting to the emergency department for evaluation.  Please review instructions for taking nitroglycerin as well.  Thank you.  Thomasene Ripple. Akshar Starnes NP-C     06/30/2023, 10:08 AM [facility] [address] Office [phone] Fax [phone]

## 2023-06-30 NOTE — Telephone Encounter (Signed)
Forwarded results via mychart.

## 2023-06-30 NOTE — Telephone Encounter (Signed)
Error

## 2023-07-01 ENCOUNTER — Encounter: Payer: Self-pay | Admitting: Family Medicine

## 2023-07-01 ENCOUNTER — Ambulatory Visit: Payer: PPO | Admitting: Family Medicine

## 2023-07-01 VITALS — BP 122/70 | HR 54 | Temp 98.0°F | Ht 64.0 in | Wt 194.0 lb

## 2023-07-01 DIAGNOSIS — H6122 Impacted cerumen, left ear: Secondary | ICD-10-CM

## 2023-07-01 NOTE — Progress Notes (Signed)
[facility] [address] [place] [address] Dept: [phone] Dept Fax: [phone]  Office Visit  Subjective:    Patient ID: Devin Martin, male    DOB: 1945-09-04, 77 y.o..   MRN: [id number]  Chief Complaint  Patient presents with   Ear Fullness    C/o having fullness in LT ear x 2-3 days.    History of Present Illness:  Patient is in today with sudden onset of decreased hearing in his left ear. He uses docusate at home to try ahd relieve wax, but this has not helped this ear.  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 tape 01/24/2023   Angina pectoris (HCC) 08/30/2022   Obesity (BMI 30.0-34.9) 10/19/2021   Prediabetes 10/19/2021   Remote history of stroke 06/03/2019   Cholecystitis 01/16/2017   S/P BKA (below knee amputation) (HCC) 05/06/2016   Osteoarthritis of right knee 12/31/2013   Hypogonadism male 08/12/2011   GERD 04/05/2010   Essential hypertension 01/26/2009   Hyperlipidemia 06/23/2007   Obstructive sleep apnea 06/23/2007   Coronary artery disease involving native coronary artery of native heart with angina pectoris (HCC) 06/23/2007   History of kidney stones 06/17/2007   Past Surgical History:  Procedure Laterality Date   AMPUT TRAUM LEG BELOW KNEE UNILT W/O COMPL Left    CARDIAC CATHETERIZATION     CORONARY IMAGING/OCT N/A 08/30/2022   Procedure: INTRAVASCULAR IMAGING/OCT;  Surgeon: Orbie Pyo, MD;  Location: [facility];  Service: Cardiovascular;  Laterality: N/A;   CORONARY PRESSURE/FFR STUDY N/A 08/30/2022   Procedure: INTRAVASCULAR PRESSURE WIRE/FFR STUDY;  Surgeon: Orbie Pyo, MD;  Location: [facility];  Service: Cardiovascular;  Laterality: N/A;   CORONARY STENT INTERVENTION N/A 08/30/2022   Procedure: CORONARY STENT INTERVENTION;  Surgeon: Orbie Pyo, MD;  Location: [facility];  Service: Cardiovascular;  Laterality: N/A;   CYST REMOVAL TRUNK  09/04/2012   Procedure: CYST REMOVAL TRUNK;  Surgeon: Axel Filler, MD;  Location: [facility];  Service: General;  Laterality: N/A;  EXCISION OF MID BACK MASS   EYE SURGERY Left DR.SON   CATARACT SX1/2019   INCISION AND DRAINAGE ABSCESS Left 07/31/2013   Procedure: INCISIONAL/NON-INCISIONAL DEBRIDEMENT OF ABSCESS LEFT KNEE;  Surgeon: Shelda Pal, MD;  Location: [facility];  Service: Orthopedics;  Laterality: Left;   KIDNEY STONE SURGERY     multiple episodes    LEFT HEART CATH AND CORONARY ANGIOGRAPHY N/A 08/30/2022   Procedure: LEFT HEART CATH AND CORONARY ANGIOGRAPHY;  Surgeon: Orbie Pyo, MD;  Location: [facility];  Service: Cardiovascular;  Laterality: N/A;   Right Knee Arthroscopy  2010   [facility] [facility]   TONSILLECTOMY     Family History  Problem Relation Age of Onset   Stroke Mother    Cancer Mother        Ovary, Breast   Heart attack Father 68   Heart attack Paternal Grandfather    Colon cancer Neg Hx    Prostate cancer Neg Hx    Diabetes Neg Hx    Esophageal cancer Neg Hx    Rectal cancer Neg Hx    Stomach cancer Neg Hx    Outpatient Medications Prior to Visit  Medication Sig Dispense Refill   AMBULATORY NON FORMULARY MEDICATION Prosthetic Leg Liners 2 application 0  AMBULATORY NON FORMULARY MEDICATION ResMed CPAP Machine     Aspirin 81 MG EC tablet Take 1 tablet (81 mg total) by mouth daily.     azelastine (ASTELIN) 0.1 % nasal spray Place 1 spray into both nostrils 2 (two) times daily. Use in each nostril as directed 30 mL 12   chlorthalidone (HYGROTON) 25 MG tablet Take 0.5 tablets (12.5 mg total) by mouth daily. 45 tablet 3   Cholecalciferol (D3 ADULT PO) Take 1 tablet by mouth every evening.     clopidogrel (PLAVIX) 75 MG tablet TAKE 1 TABLET(75 MG) BY MOUTH DAILY WITH BREAKFAST 90 tablet 3   Cyanocobalamin (B-12 PO) Take 1 tablet by  mouth every evening.     diltiazem (TIAZAC) 240 MG 24 hr capsule Take 1 capsule (240 mg total) by mouth daily. 90 capsule 3   irbesartan (AVAPRO) 150 MG tablet Take 0.5 tablets (75 mg total) by mouth daily. 90 tablet 3   isosorbide mononitrate (IMDUR) 60 MG 24 hr tablet TAKE 1 AND 1/2 TABLETS(90 MG) BY MOUTH EVERY EVENING 135 tablet 1   nitroGLYCERIN (NITROSTAT) 0.4 MG SL tablet Place 1 tablet (0.4 mg total) under the tongue every 5 minutes as needed for chest pain. 25 tablet 2   Nitroglycerin 0.4 % OINT Place 1 Application rectally in the morning and at bedtime. Apply 1 inch (375 mg) ointment intra-anally every 12 hours for anal fissure 30 g 1   potassium citrate (UROCIT-K) 10 MEQ (1080 MG) SR tablet TAKE 2 TABLET BY MOUTH DAILY 180 tablet 3   rosuvastatin (CRESTOR) 40 MG tablet TAKE 1 TABLET(40 MG) BY MOUTH DAILY 90 tablet 3   tamsulosin (FLOMAX) 0.4 MG CAPS capsule Take 1 capsule (0.4 mg total) by mouth daily. 30 capsule 3   TURMERIC PO Take 1 capsule by mouth daily.     No facility-administered medications prior to visit.   Allergies  Allergen Reactions   Penicillins Hives and Other (See Comments)    syncope   Tylox [Oxycodone-Acetaminophen] Other (See Comments)    "messes me up"   Codeine Nausea Only     Objective:   Today's Vitals   07/01/23 1405  BP: 122/70  Pulse: (!) 54  Temp: 98 F (36.7 C)  TempSrc: Temporal  SpO2: 94%  Weight: 194 lb (88 kg)  Height: 5\' 4"  (1.626 m)   Body mass index is 33.3 kg/m.   General: Well developed, well nourished. No acute distress. HEENT: Normocephalic, non-traumatic. External ears normal. Right EAC and TM normal. Left EAC with impacted wax. Psych: Alert and oriented. Normal mood and affect.  Health Maintenance Due  Topic Date Due   Zoster Vaccines- Shingrix (1 of 2) 11/14/1997   PROCEDURE- Ear Wax Removal Indication: Impacted ear wax  PARQ reviewed with patient. Verbal consent obtained. Flushed ear with mixture of warm water and  hydrogen peroxide. Lighted curette used to complete remove wax. Patient tolerated procedure well.    Assessment & Plan:   Problem List Items Addressed This Visit   None Visit Diagnoses     Impacted cerumen of left ear    -  Primary       Return if symptoms worsen or fail to improve.   Loyola Mast, MD

## 2023-07-03 ENCOUNTER — Telehealth (HOSPITAL_COMMUNITY): Payer: Self-pay | Admitting: *Deleted

## 2023-07-03 NOTE — Telephone Encounter (Signed)
Left message on voicemail per DPR in reference to upcoming appointment scheduled on 07/07/2023 at 7:30 with detailed instructions given per Myocardial Perfusion Study Information Sheet for the test. LM to arrive 15 minutes early, and that it is imperative to arrive on time for appointment to keep from having the test rescheduled. If you need to cancel or reschedule your appointment, please call the office within 24 hours of your appointment. Failure to do so may result in a cancellation of your appointment, and a $50 no show fee. Phone number given for call back for any questions.

## 2023-07-07 ENCOUNTER — Ambulatory Visit (HOSPITAL_COMMUNITY): Payer: PPO | Attending: General Practice

## 2023-07-07 DIAGNOSIS — I25118 Atherosclerotic heart disease of native coronary artery with other forms of angina pectoris: Secondary | ICD-10-CM | POA: Diagnosis not present

## 2023-07-07 MED ORDER — TECHNETIUM TC 99M TETROFOSMIN IV KIT
7.8000 | PACK | Freq: Once | INTRAVENOUS | Status: AC | PRN
  Administered 2023-07-07: 7.8 via INTRAVENOUS

## 2023-07-07 MED ORDER — TECHNETIUM TC 99M TETROFOSMIN IV KIT
27.7000 | PACK | Freq: Once | INTRAVENOUS | Status: AC | PRN
  Administered 2023-07-07: 27.7 via INTRAVENOUS

## 2023-07-07 MED ORDER — REGADENOSON 0.4 MG/5ML IV SOLN
0.4000 mg | Freq: Once | INTRAVENOUS | Status: AC
  Administered 2023-07-07: 0.4 mg via INTRAVENOUS

## 2023-07-08 LAB — MYOCARDIAL PERFUSION IMAGING
LV dias vol: 86 mL (ref 62–150)
LV sys vol: 42 mL
Nuc Stress EF: 51 %
Peak HR: 75 {beats}/min
Rest HR: 52 {beats}/min
Rest Nuclear Isotope Dose: 7.8 mCi
SDS: 0
SRS: 9
SSS: 9
ST Depression (mm): 0 mm
Stress Nuclear Isotope Dose: 27.7 mCi
TID: 1.11

## 2023-07-09 ENCOUNTER — Encounter: Payer: Self-pay | Admitting: Cardiology

## 2023-07-09 ENCOUNTER — Other Ambulatory Visit (HOSPITAL_COMMUNITY): Payer: Self-pay

## 2023-07-09 ENCOUNTER — Telehealth: Payer: Self-pay | Admitting: General Practice

## 2023-07-09 DIAGNOSIS — G4733 Obstructive sleep apnea (adult) (pediatric): Secondary | ICD-10-CM | POA: Diagnosis not present

## 2023-07-09 DIAGNOSIS — I25118 Atherosclerotic heart disease of native coronary artery with other forms of angina pectoris: Secondary | ICD-10-CM

## 2023-07-09 NOTE — Telephone Encounter (Signed)
Routing question to provider

## 2023-07-09 NOTE — Telephone Encounter (Signed)
Ronney Asters, NP  You6 minutes ago (11:47 AM)    Echocardiogram can be performed after cardiac catheterization while patient is in the hospital.  Please cancel order for outpatient echocardiogram.  Thank you.  Thomasene Ripple. Cleaver NP-C     07/09/2023, 11:47 AM [facility] [address] Office [phone] Fax [phone]

## 2023-07-09 NOTE — Telephone Encounter (Signed)
ECHO is cancelled. Can you please call pt and re-schedule after the CATH?

## 2023-07-09 NOTE — Telephone Encounter (Signed)
Contacted patient and reviewed stress testing results.  He expressed understanding.  Results of also been reviewed with Dr. Jens Som.  He agrees to proceed with cardiac catheterization.  Risks and benefits were reviewed.  Will plan for cardiac catheterization at the end of this week or beginning next week.  Shared Decision Making/Informed Consent The risks [stroke (1 in 1000), death (1 in 1000), kidney failure [usually temporary] (1 in 500), bleeding (1 in 200), allergic reaction [possibly serious] (1 in 200)], benefits (diagnostic support and management of coronary artery disease) and alternatives of a cardiac catheterization were discussed in detail with Devin Martin and he is willing to proceed.   Devin Martin. Neeta Storey NP-C     07/09/2023, 8:50 AM [facility] [address] Office [phone] Fax [phone]

## 2023-07-09 NOTE — Addendum Note (Signed)
Addended by: Ronney Asters on: 07/09/2023 09:34 AM   Modules accepted: Orders

## 2023-07-10 ENCOUNTER — Other Ambulatory Visit: Payer: Self-pay

## 2023-07-10 DIAGNOSIS — Z01812 Encounter for preprocedural laboratory examination: Secondary | ICD-10-CM

## 2023-07-11 LAB — CBC
Hematocrit: 42.8 % (ref 37.5–51.0)
Hemoglobin: 14.1 g/dL (ref 13.0–17.7)
MCH: 31.5 pg (ref 26.6–33.0)
MCHC: 32.9 g/dL (ref 31.5–35.7)
MCV: 96 fL (ref 79–97)
Platelets: 259 10*3/uL (ref 150–450)
RBC: 4.48 x10E6/uL (ref 4.14–5.80)
RDW: 12.2 % (ref 11.6–15.4)
WBC: 5.3 10*3/uL (ref 3.4–10.8)

## 2023-07-11 LAB — COMPREHENSIVE METABOLIC PANEL
ALT: 15 [IU]/L (ref 0–44)
AST: 19 [IU]/L (ref 0–40)
Albumin: 4.5 g/dL (ref 3.8–4.8)
Alkaline Phosphatase: 71 [IU]/L (ref 44–121)
BUN/Creatinine Ratio: 21 (ref 10–24)
BUN: 21 mg/dL (ref 8–27)
Bilirubin Total: 0.8 mg/dL (ref 0.0–1.2)
CO2: 22 mmol/L (ref 20–29)
Calcium: 9.3 mg/dL (ref 8.6–10.2)
Chloride: 102 mmol/L (ref 96–106)
Creatinine, Ser: 1.01 mg/dL (ref 0.76–1.27)
Globulin, Total: 2.1 g/dL (ref 1.5–4.5)
Glucose: 98 mg/dL (ref 70–99)
Potassium: 4.6 mmol/L (ref 3.5–5.2)
Sodium: 140 mmol/L (ref 134–144)
Total Protein: 6.6 g/dL (ref 6.0–8.5)
eGFR: 78 mL/min/{1.73_m2} (ref >59)

## 2023-07-14 ENCOUNTER — Telehealth: Payer: Self-pay | Admitting: *Deleted

## 2023-07-14 NOTE — Telephone Encounter (Signed)
Cardiac Catheterization scheduled at [facility] for: Tuesday July 15, 2023 9 AM Arrival time [facility] Main Entrance A at: 7 AM  Nothing to eat after midnight prior to procedure, clear liquids until 5 AM day of procedure.  Medication instructions: -Hold:  Chlorthalidone/KCl-AM of procedure-pt reports he usually takes in the evening. -Other usual morning medications can be taken with sips of water including aspirin 81 mg and Plavix 75 mg.  Plan to go home the same day, you will only stay overnight if medically necessary.  You must have responsible adult to drive you home.  Someone must be with you the first 24 hours after you arrive home.  Reviewed procedure instructions with patient.

## 2023-07-15 ENCOUNTER — Encounter (HOSPITAL_COMMUNITY)
Admission: RE | Disposition: A | Discharge status: Home / Self Care | Payer: Self-pay | Source: Ambulatory Visit | Attending: Cardiology

## 2023-07-15 ENCOUNTER — Ambulatory Visit (HOSPITAL_COMMUNITY)
Admission: RE | Admit: 2023-07-15 | Discharge: 2023-07-15 | Disposition: A | Discharge status: Home / Self Care | Payer: PPO | Source: Ambulatory Visit | Attending: Cardiology | Admitting: Cardiology

## 2023-07-15 ENCOUNTER — Other Ambulatory Visit: Payer: Self-pay

## 2023-07-15 ENCOUNTER — Ambulatory Visit (HOSPITAL_COMMUNITY): Payer: PPO

## 2023-07-15 DIAGNOSIS — Y832 Surgical operation with anastomosis, bypass or graft as the cause of abnormal reaction of the patient, or of later complication, without mention of misadventure at the time of the procedure: Secondary | ICD-10-CM | POA: Insufficient documentation

## 2023-07-15 DIAGNOSIS — I10 Essential (primary) hypertension: Secondary | ICD-10-CM | POA: Diagnosis not present

## 2023-07-15 DIAGNOSIS — F17200 Nicotine dependence, unspecified, uncomplicated: Secondary | ICD-10-CM | POA: Diagnosis not present

## 2023-07-15 DIAGNOSIS — I252 Old myocardial infarction: Secondary | ICD-10-CM | POA: Insufficient documentation

## 2023-07-15 DIAGNOSIS — Z7902 Long term (current) use of antithrombotics/antiplatelets: Secondary | ICD-10-CM | POA: Diagnosis not present

## 2023-07-15 DIAGNOSIS — I25118 Atherosclerotic heart disease of native coronary artery with other forms of angina pectoris: Secondary | ICD-10-CM | POA: Diagnosis not present

## 2023-07-15 DIAGNOSIS — Z7982 Long term (current) use of aspirin: Secondary | ICD-10-CM | POA: Diagnosis not present

## 2023-07-15 DIAGNOSIS — I2584 Coronary atherosclerosis due to calcified coronary lesion: Secondary | ICD-10-CM | POA: Diagnosis not present

## 2023-07-15 DIAGNOSIS — Z955 Presence of coronary angioplasty implant and graft: Secondary | ICD-10-CM | POA: Insufficient documentation

## 2023-07-15 DIAGNOSIS — R0609 Other forms of dyspnea: Secondary | ICD-10-CM | POA: Diagnosis not present

## 2023-07-15 DIAGNOSIS — Z79899 Other long term (current) drug therapy: Secondary | ICD-10-CM | POA: Diagnosis not present

## 2023-07-15 DIAGNOSIS — I25119 Atherosclerotic heart disease of native coronary artery with unspecified angina pectoris: Secondary | ICD-10-CM | POA: Diagnosis present

## 2023-07-15 DIAGNOSIS — T82855A Stenosis of coronary artery stent, initial encounter: Secondary | ICD-10-CM | POA: Diagnosis not present

## 2023-07-15 DIAGNOSIS — E785 Hyperlipidemia, unspecified: Secondary | ICD-10-CM | POA: Diagnosis not present

## 2023-07-15 HISTORY — PX: LEFT HEART CATH AND CORONARY ANGIOGRAPHY: CATH118249

## 2023-07-15 HISTORY — PX: CORONARY STENT INTERVENTION: CATH118234

## 2023-07-15 HISTORY — PX: CORONARY ULTRASOUND/IVUS: CATH118244

## 2023-07-15 LAB — POCT ACTIVATED CLOTTING TIME
Activated Clotting Time: 452 s
Activated Clotting Time: 464 s

## 2023-07-15 SURGERY — LEFT HEART CATH AND CORONARY ANGIOGRAPHY
Anesthesia: LOCAL

## 2023-07-15 MED ORDER — NITROGLYCERIN 1 MG/10 ML FOR IR/CATH LAB
INTRA_ARTERIAL | Status: AC
  Filled 2023-07-15: qty 10

## 2023-07-15 MED ORDER — MIDAZOLAM HCL 2 MG/2ML IJ SOLN
INTRAMUSCULAR | Status: AC
  Filled 2023-07-15: qty 2

## 2023-07-15 MED ORDER — IOHEXOL 350 MG/ML SOLN
INTRAVENOUS | Status: DC | PRN
  Administered 2023-07-15: 100 mL

## 2023-07-15 MED ORDER — HEPARIN SODIUM (PORCINE) 1000 UNIT/ML IJ SOLN
INTRAMUSCULAR | Status: DC | PRN
  Administered 2023-07-15 (×2): 4500 [IU] via INTRAVENOUS

## 2023-07-15 MED ORDER — VERAPAMIL HCL 2.5 MG/ML IV SOLN
INTRAVENOUS | Status: DC | PRN
  Administered 2023-07-15: 10 mL via INTRA_ARTERIAL

## 2023-07-15 MED ORDER — FENTANYL CITRATE (PF) 100 MCG/2ML IJ SOLN
INTRAMUSCULAR | Status: AC
  Filled 2023-07-15: qty 2

## 2023-07-15 MED ORDER — LIDOCAINE HCL (PF) 1 % IJ SOLN
INTRAMUSCULAR | Status: AC
  Filled 2023-07-15: qty 30

## 2023-07-15 MED ORDER — HEPARIN (PORCINE) IN NACL 1000-0.9 UT/500ML-% IV SOLN
INTRAVENOUS | Status: DC | PRN
  Administered 2023-07-15 (×2): 500 mL

## 2023-07-15 MED ORDER — ASPIRIN 81 MG PO CHEW: 81.0000 mg | CHEWABLE_TABLET | ORAL | Status: DC

## 2023-07-15 MED ORDER — NITROGLYCERIN 1 MG/10 ML FOR IR/CATH LAB
INTRA_ARTERIAL | Status: DC | PRN
  Administered 2023-07-15: 200 ug via INTRACORONARY
  Administered 2023-07-15 (×4): 100 ug via INTRACORONARY
  Administered 2023-07-15: 150 ug via INTRACORONARY

## 2023-07-15 MED ORDER — LIDOCAINE HCL (PF) 1 % IJ SOLN
INTRAMUSCULAR | Status: DC | PRN
  Administered 2023-07-15: 2 mL

## 2023-07-15 MED ORDER — MIDAZOLAM HCL 2 MG/2ML IJ SOLN
INTRAMUSCULAR | Status: DC | PRN
  Administered 2023-07-15 (×4): 1 mg via INTRAVENOUS

## 2023-07-15 MED ORDER — VERAPAMIL HCL 2.5 MG/ML IV SOLN
INTRAVENOUS | Status: AC
  Filled 2023-07-15: qty 2

## 2023-07-15 MED ORDER — HEPARIN SODIUM (PORCINE) 1000 UNIT/ML IJ SOLN
INTRAMUSCULAR | Status: AC
  Filled 2023-07-15: qty 10

## 2023-07-15 MED ORDER — SODIUM CHLORIDE 0.9 % WEIGHT BASED INFUSION
1.0000 mL/kg/h | INTRAVENOUS | Status: DC
  Administered 2023-07-15: 250 mL via INTRAVENOUS

## 2023-07-15 MED ORDER — SODIUM CHLORIDE 0.9 % WEIGHT BASED INFUSION
3.0000 mL/kg/h | INTRAVENOUS | Status: AC
  Administered 2023-07-15: 3 mL/kg/h via INTRAVENOUS

## 2023-07-15 MED ORDER — FENTANYL CITRATE (PF) 100 MCG/2ML IJ SOLN
INTRAMUSCULAR | Status: DC | PRN
  Administered 2023-07-15 (×5): 25 ug via INTRAVENOUS

## 2023-07-15 SURGICAL SUPPLY — 26 items
BALL SAPPHIRE NC24 2.50X12 (BALLOONS) ×1
BALL SAPPHIRE NC24 2.75X10 (BALLOONS) ×1
BALL SAPPHIRE NC24 3.0X15 (BALLOONS) ×1
BALLN SCOREFLEX 2.50X10 (BALLOONS) ×1
BALLN WOLVERINE 2.50X6 (BALLOONS) ×1
BALLN ~~LOC~~ EMERGE MR 2.5X6 (BALLOONS) ×1
BALLOON SAPPHIRE NC24 2.50X12 (BALLOONS) IMPLANT
BALLOON SAPPHIRE NC24 2.75X10 (BALLOONS) IMPLANT
BALLOON SAPPHIRE NC24 3.0X15 (BALLOONS) IMPLANT
BALLOON SCOREFLEX 2.50X10 (BALLOONS) IMPLANT
BALLOON WOLVERINE 2.50X6 (BALLOONS) IMPLANT
BALLOON ~~LOC~~ EMERGE MR 2.5X6 (BALLOONS) IMPLANT
CATH INFINITI AMBI 5FR TG (CATHETERS) IMPLANT
CATH LAUNCHER 6FR EBU3.5 (CATHETERS) IMPLANT
CATH OPTICROSS HD (CATHETERS) IMPLANT
DEVICE RAD COMP TR BAND LRG (VASCULAR PRODUCTS) IMPLANT
GLIDESHEATH SLEND SS 6F .021 (SHEATH) IMPLANT
KIT ENCORE 26 ADVANTAGE (KITS) IMPLANT
PACK CARDIAC CATHETERIZATION (CUSTOM PROCEDURE TRAY) ×1 IMPLANT
SET ATX-X65L (MISCELLANEOUS) IMPLANT
SHEATH PROBE COVER 6X72 (BAG) IMPLANT
SLED PULL BACK IVUS (MISCELLANEOUS) IMPLANT
STENT SYNERGY XD 2.50X16 (Permanent Stent) IMPLANT
SYNERGY XD 2.50X16 (Permanent Stent) ×1 IMPLANT
WIRE ASAHI PROWATER 180CM (WIRE) IMPLANT
WIRE EMERALD 3MM-J .035X260CM (WIRE) IMPLANT

## 2023-07-15 NOTE — Interval H&P Note (Signed)
History and Physical Interval Note:  07/15/2023 7:42 AM  Devin Martin  has presented today for surgery, with the diagnosis of cad.  The various methods of treatment have been discussed with the patient and family. After consideration of risks, benefits and other options for treatment, the patient has consented to  Procedure(s): LEFT HEART CATH AND CORONARY ANGIOGRAPHY (N/A) as a surgical intervention.  The patient's history has been reviewed, patient examined, no change in status, stable for surgery.  I have reviewed the patient's chart and labs.  Questions were answered to the patient's satisfaction.     Llana Deshazo J Murdis Flitton

## 2023-07-15 NOTE — Discharge Summary (Signed)
Discharge Summary for Same Day PCI   Patient ID: Devin Martin MRN: [id number]; DOB: 1946-03-26  Admit date: 07/15/2023 Discharge date: 07/15/2023  Primary Care Provider: Loyola Mast, MD  Primary Cardiologist: Olga Millers, MD  Primary Electrophysiologist:  None   Discharge Diagnoses    Active Problems:   Coronary artery disease involving native coronary artery of native heart with angina pectoris [facility])   Diagnostic Studies/Procedures    Coronary angiography 07/15/2023:  LM: Normal LAD: Prior proximal LAD stent (placed 2005)          40-50% restenosis in proximal half of the stent          95% moderate calcific stenosis, partly inside the distal edge of the prior stent, and partly in the native artery just distal to the stent Lcx: Patent proximal stent (placed 08/2022) RCA: Minimal luminal irregularities   LVEDP 13 mmHg   Successful percutaneous coronary intervention mid LAD     Intravascular ultrasound (IVUS)     PTCA and stent placement 2.5 X 16 mm Synergy drug-eluting stent        Post dilatation using 3.0X15 mm [place] balloon up to 18 atm      [name], MD   _____________   History of Present Illness     Devin Martin is a 77 y.o. male with PMH of coronary artery disease, hypertension, and hyperlipidemia.  He underwent LHC December 2005 in the setting of NSTEMI.  His EF was noted to be 60%.  He had successful PCI to his mid LAD with DES.  He did have jailing of his 2 small diagonal branches at that time.  He underwent subsequent cardiac catheterization 1/11 following MI.  He was noted to have an EF of 55-60%, his LAD stent was patent.  Both diagonals were noted to have 90% ostial stenosis, 25-30% circumflex, 25-30% RCA, and he was noted to have intensive vasospasm.  His infarct was felt to be related to spasm.  He underwent abdominal ultrasound 2/17 which showed no aneurysm.  He had repeat catheterization 1/24 due to exertional chest discomfort.  His  proximal LAD was noted to have moderate in-stent restenosis with normal FFR, 90% proximal circumflex lesion was treated with DES.   He was seen in follow-up by Dr. Jens Som 04/04/2023.  During that time he denied dyspnea on exertion, lower extremity swelling , PND, orthopnea, chest discomfort, syncope, and palpitations.  Recommendations for discontinuing Plavix 1/25 were given.  His blood pressure was well-controlled.   He presented to the clinic on 11/1 for follow-up evaluation and stated he typically is very physically active doing his elliptical daily, golfing, and staying very physically active. He noted that over the last few days prior to office visit he has had significantly increased shortness of breath.  He received his flu vaccination and RSV vaccination on Saturday, Sunday he was unable to do normal daily activities due to fatigue.  Monday he continued to be very tired.  Tuesday he attempted to play golf.  He was very short of breath after doing practice swings and warming up at his first tee box.  He continued to be very short of breath with activity such as pulling his trash cans out.  His EKG today showed ST changes in V1 through V4 and a new right bundle branch block.  It was recommended that he undergo outpatient which was reported as intermediate risk with ischemia and large defect with severe reduction uptake in the apical-basal septal  and apex which was reversible.   Cardiac catheterization was arranged for further evaluation.  Hospital Course     The patient underwent cardiac cath as noted above with PCI/DES x 1 to 95% LAD. Plan for DAPT with ASA/plavix for at least 6 months. The patient was seen by cardiac rehab while in short stay. There were no observed complications post cath. Radial cath site was re-evaluated prior to discharge and found to be stable without any complications. Instructions/precautions regarding cath site care were given prior to discharge.  Devin Martin was seen  by Dr. Rosemary Holms and determined stable for discharge home. Follow up with our office has been arranged. Medications are listed below. Pertinent changes include n/a.    _____________  Cath/PCI Registry Performance & Quality Measures: Aspirin prescribed? - Yes ADP Receptor Inhibitor (Plavix/Clopidogrel, Brilinta/Ticagrelor or Effient/Prasugrel) prescribed (includes medically managed patients)? - Yes High Intensity Statin (Lipitor 40-80mg  or Crestor 20-40mg ) prescribed? - Yes For EF <40%, was ACEI/ARB prescribed? - Not Applicable (EF >/= 40%) For EF <40%, Aldosterone Antagonist (Spironolactone or Eplerenone) prescribed? - Not Applicable (EF >/= 40%) Cardiac Rehab Phase II ordered (Included Medically managed Patients)? - Yes  _____________   Discharge Vitals Blood pressure (!) 97/59, pulse 60, temperature 97.9 F (36.6 C), temperature source Oral, resp. rate (!) 9, height 5\' 4"  (1.626 m), weight 86.2 kg, SpO2 98%.  Filed Weights   07/15/23 0732  Weight: 86.2 kg    Last Labs & Radiologic Studies    CBC No results for input(s): "WBC", "NEUTROABS", "HGB", "HCT", "MCV", "PLT" in the last 72 hours. Basic Metabolic Panel No results for input(s): "NA", "K", "CL", "CO2", "GLUCOSE", "BUN", "CREATININE", "CALCIUM", "MG", "PHOS" in the last 72 hours. Liver Function Tests No results for input(s): "AST", "ALT", "ALKPHOS", "BILITOT", "PROT", "ALBUMIN" in the last 72 hours. No results for input(s): "LIPASE", "AMYLASE" in the last 72 hours. High Sensitivity Troponin:   No results for input(s): "TROPONINIHS" in the last 720 hours.  BNP Invalid input(s): "POCBNP" D-Dimer No results for input(s): "DDIMER" in the last 72 hours. Hemoglobin A1C No results for input(s): "HGBA1C" in the last 72 hours. Fasting Lipid Panel No results for input(s): "CHOL", "HDL", "LDLCALC", "TRIG", "CHOLHDL", "LDLDIRECT" in the last 72 hours. Thyroid Function Tests No results for input(s): "TSH", "T4TOTAL", "T3FREE",  "THYROIDAB" in the last 72 hours.  Invalid input(s): "FREET3" _____________  CARDIAC CATHETERIZATION  Result Date: 07/15/2023 Images from the original result were not included. Coronary angiography 07/15/2023: LM: Normal LAD: Prior proximal LAD stent (placed 2005)          40-50% restenosis in proximal half of the stent          95% moderate calcific stenosis, partly inside the distal edge of the prior stent, and partly in the native artery just distal to the stent Lcx: Patent proximal stent (placed 08/2022) RCA: Minimal luminal irregularities LVEDP 13 mmHg Successful percutaneous coronary intervention mid LAD     Intravascular ultrasound (IVUS)     PTCA and stent placement 2.5 X 16 mm Synergy drug-eluting stent        Post dilatation using 3.0X15 mm  balloon up to 18 atm Elder Negus, MD   MYOCARDIAL PERFUSION IMAGING  Result Date: 07/08/2023   Findings are consistent with infarction with peri-infarct ischemia. The study is intermediate risk.   No ST deviation was noted.   LV perfusion is abnormal. There is evidence of ischemia. There is evidence of infarction. Defect 1: There is a large defect  with severe reduction in uptake present in the apical to basal septal and apex location(s) that is partially reversible. There is abnormal wall motion in the defect area. Consistent with infarction and peri-infarct ischemia.   Left ventricular function is abnormal. Global function is mildly reduced. Nuclear stress EF: 51%. The left ventricular ejection fraction is mildly decreased (45-54%). End diastolic cavity size is normal. End systolic cavity size is normal.   Prior study not available for comparison. Large septal infarct with minimal peri-infarct ischemia. RV  tracer uptake also noted.    Disposition   Pt is being discharged home today in good condition.  Follow-up Plans & Appointments     Discharge Instructions     Amb Referral to Cardiac Rehabilitation   Complete by: As directed     Diagnosis: Coronary Stents   After initial evaluation and assessments completed: Virtual Based Care may be provided alone or in conjunction with Phase 2 Cardiac Rehab based on patient barriers.: Yes   Intensive Cardiac Rehabilitation (ICR) [facility] location only OR Traditional Cardiac Rehabilitation (TCR) *If criteria for ICR are not met will enroll in TCR [facility] only): Yes        Discharge Medications   Allergies as of 07/15/2023       Reactions   Penicillins Hives, Other (See Comments)   syncope   Tylox [oxycodone-acetaminophen] Other (See Comments)   "messes me up"   Codeine Nausea Only        Medication List     STOP taking these medications    B-12 PO       TAKE these medications    AMBULATORY NON FORMULARY MEDICATION at bedtime. ResMed CPAP Machine   AMBULATORY NON FORMULARY MEDICATION Prosthetic Leg Liners   Aspirin 81 MG EC tablet Take 1 tablet (81 mg total) by mouth daily.   chlorthalidone 25 MG tablet Commonly known as: HYGROTON Take 0.5 tablets (12.5 mg total) by mouth daily.   clopidogrel 75 MG tablet Commonly known as: PLAVIX TAKE 1 TABLET(75 MG) BY MOUTH DAILY WITH BREAKFAST   diltiazem 240 MG 24 hr capsule Commonly known as: TIAZAC Take 1 capsule (240 mg total) by mouth daily.   irbesartan 150 MG tablet Commonly known as: AVAPRO Take 75 mg by mouth daily.   isosorbide mononitrate 60 MG 24 hr tablet Commonly known as: IMDUR TAKE 1 AND 1/2 TABLETS(90 MG) BY MOUTH EVERY EVENING   meloxicam 15 MG tablet Commonly known as: MOBIC Take 15 mg by mouth daily as needed (Knee pain).   Nitroglycerin 0.4 % Oint Place 1 Application rectally in the morning and at bedtime. Apply 1 inch (375 mg) ointment intra-anally every 12 hours for anal fissure   nitroGLYCERIN 0.4 MG SL tablet Commonly known as: Nitrostat Place 1 tablet (0.4 mg total) under the tongue every 5 minutes as needed for chest pain.   potassium citrate 10 MEQ (1080 MG) SR tablet Commonly  known as: UROCIT-K TAKE 2 TABLET BY MOUTH DAILY   rosuvastatin 40 MG tablet Commonly known as: CRESTOR TAKE 1 TABLET(40 MG) BY MOUTH DAILY   tamsulosin 0.4 MG Caps capsule Commonly known as: FLOMAX Take 1 capsule (0.4 mg total) by mouth daily.   TURMERIC PO Take 1,500-3,000 mg by mouth daily. 1500 mg each alternate every mouth   Vitamin D3 50 MCG (2000 UT) capsule Take 2,000 Units by mouth daily.           Allergies Allergies  Allergen Reactions   Penicillins Hives and Other (See Comments)  syncope   Tylox [Oxycodone-Acetaminophen] Other (See Comments)    "messes me up"   Codeine Nausea Only    Outstanding Labs/Studies   N/a   Duration of Discharge Encounter   Greater than 30 minutes including physician time.  Signed, Laverda Page, NP 07/15/2023, 3:02 PM

## 2023-07-15 NOTE — Progress Notes (Signed)
Patient and wife was given discharge instructions. Both verbalized understanding. 

## 2023-07-15 NOTE — H&P (Signed)
OV 06/27/2023 copied for documentation     Cardiology Clinic Note   Patient Name: Devin Martin Date of Encounter: 07/15/2023  Primary Care Provider:  Loyola Mast, MD Primary Cardiologist:  Olga Millers, MD  Patient Profile    Devin Martin 77 year old male presents the clinic today for follow-up evaluation of his coronary artery disease and hyperlipidemia.  Past Medical History    Past Medical History:  Diagnosis Date   ANXIETY 10/13/2009   Qualifier: Diagnosis of  By: Jens Som, MD, Lyn Hollingshead    Arthritis 1968   pt wears a lt bk prosthesis   Bronchitis, chronic (HCC)    CAD (coronary artery disease)    Non-ST segment elevation myocardial infarction in 2005 with drug-eluting stent to the left anterior descending. 08-2009 - non-ST segment elevation myocardial infarction, felt secondary to vasospasm.   Calculus, kidney    hx   Complication of anesthesia    itching after preop med  ?name - 2013, no SOB   Glaucoma    Hyperlipidemia    Hypertension    Myocardial infarction [facility]) 2005   x2, 2005, 2010   OSA (obstructive sleep apnea)    AHI 68/hr in 2002-uses a cpap   Pneumonia    hx of    Sleep apnea    wears CPAP   Wears glasses    Past Surgical History:  Procedure Laterality Date   AMPUT TRAUM LEG BELOW KNEE UNILT W/O COMPL Left    CARDIAC CATHETERIZATION     CORONARY IMAGING/OCT N/A 08/30/2022   Procedure: INTRAVASCULAR IMAGING/OCT;  Surgeon: Orbie Pyo, MD;  Location: [facility];  Service: Cardiovascular;  Laterality: N/A;   CORONARY PRESSURE/FFR STUDY N/A 08/30/2022   Procedure: INTRAVASCULAR PRESSURE WIRE/FFR STUDY;  Surgeon: Orbie Pyo, MD;  Location: [facility];  Service: Cardiovascular;  Laterality: N/A;   CORONARY STENT INTERVENTION N/A 08/30/2022   Procedure: CORONARY STENT INTERVENTION;  Surgeon: Orbie Pyo, MD;  Location: [facility];  Service: Cardiovascular;  Laterality: N/A;   CYST REMOVAL TRUNK   09/04/2012   Procedure: CYST REMOVAL TRUNK;  Surgeon: Axel Filler, MD;  Location: [facility];  Service: General;  Laterality: N/A;  EXCISION OF MID BACK MASS   EYE SURGERY Left DR.SON   CATARACT SX1/2019   INCISION AND DRAINAGE ABSCESS Left 07/31/2013   Procedure: INCISIONAL/NON-INCISIONAL DEBRIDEMENT OF ABSCESS LEFT KNEE;  Surgeon: Shelda Pal, MD;  Location: [facility];  Service: Orthopedics;  Laterality: Left;   KIDNEY STONE SURGERY     multiple episodes    LEFT HEART CATH AND CORONARY ANGIOGRAPHY N/A 08/30/2022   Procedure: LEFT HEART CATH AND CORONARY ANGIOGRAPHY;  Surgeon: Orbie Pyo, MD;  Location: [facility];  Service: Cardiovascular;  Laterality: N/A;   Right Knee Arthroscopy  2010   [facility]   TONSILLECTOMY      Allergies  Allergies  Allergen Reactions   Penicillins Hives and Other (See Comments)    syncope   Tylox [Oxycodone-Acetaminophen] Other (See Comments)    "messes me up"   Codeine Nausea Only    History of Present Illness    Devin Martin has a PMH of coronary artery disease, hypertension, and hyperlipidemia.  He underwent LHC December 2005 in the setting of NSTEMI.  His EF was noted to be 60%.  He had successful PCI to his mid LAD with DES.  He did have jailing of his 2 small diagonal branches  at that time.  He underwent subsequent cardiac catheterization 1/11 following MI.  He was noted to have an EF of 55-60%, his LAD stent was patent.  Both diagonals were noted to have 90% ostial stenosis, 25-30% circumflex, 25-30% RCA, and he was noted to have intensive vasospasm.  His infarct was felt to be related to spasm.  He underwent abdominal ultrasound 2/17 which showed no aneurysm.  He had repeat catheterization 1/24 due to exertional chest discomfort.  His proximal LAD was noted to have moderate in-stent restenosis with normal FFR, 90% proximal circumflex lesion was treated with DES.  He was seen in follow-up by Dr. Jens Som 04/04/2023.   During that time he denied dyspnea on exertion, lower extremity swelling , PND, orthopnea, chest discomfort, syncope, and palpitations.  Recommendations for discontinuing Plavix 1/25 were given.  His blood pressure was well-controlled.  He presents to the clinic today for follow-up evaluation and states he typically is very physically active doing his elliptical daily, golfing, and staying very physically active.  He presents with his daughter.  He notes that over the last few days he has had significantly increased shortness of breath.  He received his flu vaccination and RSV vaccination on Saturday, Sunday he was unable to do normal daily activities due to fatigue.  Monday he continued to be very tired.  Tuesday he attempted to play golf.  He was very short of breath after doing practice swings and warming up at his first tee box.  He continues to be very short of breath with activity such as pulling his trash cans out.  His EKG today showed ST changes in V1 through V4 and a new right bundle branch block.  We reviewed his previous cardiac catheterizations.  He expressed understanding.  I reviewed EKG with DOD.  I will order nuclear stress test and echocardiogram as well as CBC and BMP.  Will plan follow-up after testing.  Today he denies chest pain,  lower extremity edema, fatigue, palpitations, melena, hematuria, hemoptysis, diaphoresis, weakness, presyncope, syncope, orthopnea, and PND.   Home Medications    Prior to Admission medications   Medication Sig Start Date End Date Taking? Authorizing Provider  AMBULATORY NON FORMULARY MEDICATION Prosthetic Leg Liners 10/12/18   Wanda Plump, MD  AMBULATORY NON FORMULARY MEDICATION ResMed CPAP Machine    [provider]  Aspirin 81 MG EC tablet Take 1 tablet (81 mg total) by mouth daily. 08/30/22   Marjie Skiff E, PA-C  azelastine (ASTELIN) 0.1 % nasal spray Place 1 spray into both nostrils 2 (two) times daily. Use in each nostril as directed  04/09/23   Loyola Mast, MD  chlorthalidone (HYGROTON) 25 MG tablet Take 0.5 tablets (12.5 mg total) by mouth daily. 01/13/23 03/08/24  Loyola Mast, MD  Cholecalciferol (D3 ADULT PO) Take 1 tablet by mouth every evening.    [provider]  clopidogrel (PLAVIX) 75 MG tablet TAKE 1 TABLET(75 MG) BY MOUTH DAILY WITH BREAKFAST 05/16/23   Lewayne Bunting, MD  Cyanocobalamin (B-12 PO) Take 1 tablet by mouth every evening.    [provider]  diltiazem (TIAZAC) 240 MG 24 hr capsule Take 1 capsule (240 mg total) by mouth daily. 08/29/22   Lewayne Bunting, MD  irbesartan (AVAPRO) 150 MG tablet Take 0.5 tablets (75 mg total) by mouth daily. 07/30/22   Loyola Mast, MD  isosorbide mononitrate (IMDUR) 60 MG 24 hr tablet TAKE 1 AND 1/2 TABLETS(90 MG) BY MOUTH EVERY EVENING 03/25/23  Lewayne Bunting, MD  nitroGLYCERIN (NITROSTAT) 0.4 MG SL tablet Place 1 tablet (0.4 mg total) under the tongue every 5 minutes as needed for chest pain. 08/30/22   Corrin Parker, PA-C  Nitroglycerin 0.4 % OINT Place 1 Application rectally in the morning and at bedtime. Apply 1 inch (375 mg) ointment intra-anally every 12 hours for anal fissure 09/09/22   Hilarie Fredrickson, MD  potassium citrate (UROCIT-K) 10 MEQ (1080 MG) SR tablet TAKE 2 TABLET BY MOUTH DAILY 02/21/23   Loyola Mast, MD  rosuvastatin (CRESTOR) 40 MG tablet TAKE 1 TABLET(40 MG) BY MOUTH DAILY 08/29/22   Lewayne Bunting, MD  tamsulosin (FLOMAX) 0.4 MG CAPS capsule Take 1 capsule (0.4 mg total) by mouth daily. 04/11/23   Loyola Mast, MD  TURMERIC PO Take 1 capsule by mouth daily.    [provider]    Family History    Family History  Problem Relation Age of Onset   Stroke Mother    Cancer Mother        Ovary, Breast   Heart attack Father 23   Heart attack Paternal Grandfather    Colon cancer Neg Hx    Prostate cancer Neg Hx    Diabetes Neg Hx    Esophageal cancer Neg Hx    Rectal cancer Neg Hx    Stomach cancer Neg  Hx    He indicated that his mother is alive. He indicated that his father is deceased. He indicated that both of his sisters are alive. He indicated that his maternal grandmother is deceased. He indicated that his maternal grandfather is deceased. He indicated that his paternal grandmother is deceased. He indicated that his paternal grandfather is deceased. He indicated that the status of his neg hx is unknown.  Social History    Social History   Socioeconomic History   Marital status: Married    Spouse name: Not on file   Number of children: 2   Years of education: Not on file   Highest education level: Not on file  Occupational History   Occupation: Art gallery manager-- RETIRED 2012  Tobacco Use   Smoking status: Former    Current packs/day: 0.00    Average packs/day: 0.8 packs/day for 20.0 years (15.0 ttl pk-yrs)    Types: Cigarettes, E-cigarettes    Start date: 1995    Quit date: 2015    Years since quitting: 9.8   Smokeless tobacco: Never   Tobacco comments:    currently doing some e-cigarettes  Vaping Use   Vaping status: Every Day   Substances: Nicotine  Substance and Sexual Activity   Alcohol use: Yes    Comment: bourbon 3 fingers 3 x/week.   Drug use: No   Sexual activity: Not Currently  Other Topics Concern   Not on file  Social History Narrative   Lives w/ wife    Social Determinants of Health   Financial Resource Strain: Low Risk  (12/13/2022)   Overall Financial Resource Strain (CARDIA)    Difficulty of Paying Living Expenses: Not hard at all  Food Insecurity: No Food Insecurity (12/13/2022)   Hunger Vital Sign    Worried About Running Out of Food in the Last Year: Never true    Ran Out of Food in the Last Year: Never true  Transportation Needs: No Transportation Needs (12/13/2022)   PRAPARE - Administrator, Civil Service (Medical): No    Lack of Transportation (Non-Medical): No  Physical Activity:  Sufficiently Active (12/13/2022)   Exercise  Vital Sign    Days of Exercise per Week: 7 days    Minutes of Exercise per Session: 90 min  Stress: No Stress Concern Present (12/13/2022)   [employer] of Occupational Health - Occupational Stress Questionnaire    Feeling of Stress : Not at all  Social Connections: Socially Integrated (01/09/2022)   Social Connection and Isolation Panel [NHANES]    Frequency of Communication with Friends and Family: Three times a week    Frequency of Social Gatherings with Friends and Family: Three times a week    Attends Religious Services: More than 4 times per year    Active Member of Clubs or Organizations: Yes    Attends Banker Meetings: More than 4 times per year    Marital Status: Married  Catering manager Violence: Not At Risk (01/09/2022)   Humiliation, Afraid, Rape, and Kick questionnaire    Fear of Current or Ex-Partner: No    Emotionally Abused: No    Physically Abused: No    Sexually Abused: No     Review of Systems    General:  No chills, fever, night sweats or weight changes.  Cardiovascular:  No chest pain, dyspnea on exertion, edema, orthopnea, palpitations, paroxysmal nocturnal dyspnea. Dermatological: No rash, lesions/masses Respiratory: No cough, dyspnea Urologic: No hematuria, dysuria Abdominal:   No nausea, vomiting, diarrhea, bright red blood per rectum, melena, or hematemesis Neurologic:  No visual changes, wkns, changes in mental status. All other systems reviewed and are otherwise negative except as noted above.  Physical Exam    VS:  BP 100/60   Pulse (!) 54   Temp 97.9 F (36.6 C) (Oral)   Resp 19   Ht 5\' 4"  (1.626 m)   Wt 86.2 kg   SpO2 97%   BMI 32.61 kg/m  , BMI Body mass index is 32.61 kg/m. GEN: Well nourished, well developed, in no acute distress. HEENT: normal. Neck: Supple, no JVD, carotid bruits, or masses. Cardiac: RRR, no murmurs, rubs, or gallops. No clubbing, cyanosis, edema.  Radials/DP/PT 2+ and equal bilaterally.   Respiratory:  Respirations regular and unlabored, clear to auscultation bilaterally. GI: Soft, nontender, nondistended, BS + x 4. MS: no deformity or atrophy. Skin: warm and dry, no rash. Neuro:  Strength and sensation are intact. Psych: Normal affect.  Accessory Clinical Findings    Recent Labs: 07/10/2023: ALT 15; BUN 21; Creatinine, Ser 1.01; Hemoglobin 14.1; Platelets 259; Potassium 4.6; Sodium 140   Recent Lipid Panel    Component Value Date/Time   CHOL 113 10/01/2022 0821   TRIG 50 10/01/2022 0821   HDL 64 10/01/2022 0821   CHOLHDL 1.8 10/01/2022 0821   CHOLHDL 2 10/19/2021 0908   VLDL 12.4 10/19/2021 0908   LDLCALC 37 10/01/2022 0821   LDLDIRECT 150.1 06/24/2007 0900         ECG personally reviewed by me today-      Cardiac catheterization 08/30/2022    Prox Cx lesion is 90% stenosed.   Prox LAD to Mid LAD lesion is 50% stenosed.   Ost LAD to Prox LAD lesion is 25% stenosed.   A stent was successfully placed.   Post intervention, there is a 0% residual stenosis.   1.  Patent proximal LAD stent with moderate in-stent restenosis with an RFR of 0.92; intervention was therefore deferred. 2.  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 3.  LVEDP of  20 mmHg   Recommendation: Due to ongoing chest discomfort likely due to arterial stretch we will monitor the patient overnight with plan discharge in the morning.  Plan on preferably 6 months of dual antiplatelet therapy and then Plavix monotherapy thereafter.  Diagnostic Dominance: Right  Intervention       Assessment & Plan   1.   Coronary artery disease-last cardiac catheterization 1/24 in the setting of exertional chest discomfort.  He was noted to have proximal LAD moderate in-stent restenosis with normal FFR.  A 90% proximal circumflex lesion was treated with DES.  Has noticed increased shortness of breath and fatigue since having RSV and  flu vaccinations. Continue aspirin, Imdur, Plavix-plan to discontinue Plavix 1/25 Maintain physical activity Heart healthy low-sodium diet Order nuclear stress test, echocardiogram CBC, BMP  Addendum to previous note: Nuclear stress testing (07/07/2023) reviewed with Dr. Jens Som.  He was noted to have an intermediate risk study with evidence of ischemia and a large defect with severe reduction in uptake along the apical-basal septal and apex which was partially reversible.  I contacted patient and reviewed results.  He agrees to proceed with LHC.  Essential hypertension-BP today 140/72. Continue chlorthalidone, Imdur, diltiazem Maintain blood pressure log  Hyperlipidemia-LDL 37 on 10/01/2022. Continue rosuvastatin, aspirin High-fiber diet Increase physical activity as tolerated  Disposition: Follow-up with Dr. Jens Som or me in 9-12 months.   Thomasene Ripple. Cleaver NP-C     07/15/2023, 7:41 AM [facility] [address] Office [phone] Fax [phone]    I spent 15 minutes examining this patient, reviewing medications, and using patient centered shared decision making involving her cardiac care.   I spent greater than 20 minutes reviewing her past medical history,  medications, and prior cardiac tests.

## 2023-07-15 NOTE — Discharge Instructions (Signed)

## 2023-07-15 NOTE — Progress Notes (Signed)
Pt was educated on stent card, stent location, Antiplatelet and ASA use, wt restrictions, no baths/daily wash-ups, s/s of infection, ex guidelines, s/s to stop exercising, NTG use and calling 911, heart healthy diet, risk factors and CRPII. Pt received materials on exercise, diet, and CRPII. Will refer to [facility].   Devin Martin 07/15/2023 2:56 PM

## 2023-07-16 ENCOUNTER — Encounter (HOSPITAL_COMMUNITY): Payer: Self-pay | Admitting: Cardiology

## 2023-07-16 ENCOUNTER — Ambulatory Visit (HOSPITAL_COMMUNITY): Payer: PPO

## 2023-07-17 ENCOUNTER — Telehealth: Payer: Self-pay

## 2023-07-17 ENCOUNTER — Ambulatory Visit (HOSPITAL_COMMUNITY): Payer: PPO

## 2023-07-17 NOTE — Telephone Encounter (Signed)
A user error has taken place: encounter opened in error, closed for administrative reasons.

## 2023-07-21 ENCOUNTER — Other Ambulatory Visit: Payer: Self-pay | Admitting: Family Medicine

## 2023-07-21 NOTE — Telephone Encounter (Signed)
Refill request for  Meloxicam 15 mg LR hx provider LOV 07/01/23 FOV  12/15/23  Please review and advise.  Thanks. Dm/cma

## 2023-07-23 DIAGNOSIS — M160 Bilateral primary osteoarthritis of hip: Secondary | ICD-10-CM | POA: Diagnosis not present

## 2023-07-23 DIAGNOSIS — M545 Low back pain, unspecified: Secondary | ICD-10-CM | POA: Diagnosis not present

## 2023-07-23 NOTE — Telephone Encounter (Signed)
Pt still needs ECHO added to appt not for upcoming appt. Will check after.

## 2023-07-27 ENCOUNTER — Other Ambulatory Visit: Payer: Self-pay | Admitting: Cardiology

## 2023-07-29 NOTE — Progress Notes (Addendum)
Cardiology Office Note:  .   Date:  07/30/2023  ID:  Devin Martin, DOB 1946/05/18, MRN [id number] PCP: Loyola Mast, MD  [facility] Providers Cardiologist:  Olga Millers, MD   History of Present Illness: .   Devin Martin is a 77 y.o. male with a past medical history of CAD, HTN, HLD. Patient is followed by Dr. Jens Som, presents today for follow up after PCI   Patient previously had an NSTEMI in 07/2004. Underwent LHC with successful PCI to his mid LAD with DES. He did have jailing of his 2 small diagonal branches at that time. EF was 60%. He later had an MI in 08/2009 and underwent catheterization that showed EF 55-60%, patent LAD stent. The LAD stent jailed 2 diagonal branches, and both diagonals had 90% ostial stenoses. There was also 25-30% stenosis in the circumflex, 25-30% stenosis in the RCA. There was intense vasospasm, and it was suspected that his MI was related to his spasm.   In 08/2022, patient complained of exertional chest pain. Underwent cardiac catheterization on 08/30/22 that showed patent proximal LAD stent with moderate in-stent restenosis with an RFR of 0.92. 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   Patient was seen in the office on 06/27/23. Patient reported that he was typically very physically active using his elliptical daily and golfing. He reported shortness of breath with low levels of exertion. EKG showed ST changes in V1-V4 and a RBBB. He underwent nuclear stress test on 07/07/23 that showed findings consistent with infarction with peri-infarct ischemia. He was sent for [facility] on 07/15/23 that showed normal LM, prior proximal LAD stent with 40-50% restenosis, 95% moderate calcific stenosis partly inside the distal edge of the prior stent. Mid LAD stenosis with treated with PTCA and DES. The previously placed stent in the prox Lcx was patent.   Today, patient presented for follow up  visit after a recent cardiac catheterization and stent placement. The patient reported that he has been feeling cold since the procedure, describing it as a constant sensation of coldness requiring multiple layers of clothing. He denied any associated fever, chills, or dizziness. The patient denied any bleeding or bruising but expressed concern about being over-medicated with blood thinners. He denied shortness of breath, chest pain. Notes that before his most recent stent was placed, his main symptoms were shortness of breath and fatigue. He has not had recurrence of these symptoms since his stent was placed a few weeks ago. He has started to exercise a bit on the elliptical, and has been tolerating activity well. Right radial cath site has healed well.   The patient also expressed concern about his heart health, given a family history of heart attacks at a relatively young age. He was particularly interested in understanding the location and reason for his stents, the cause of the blockages, and whether he was at risk of producing more plaque or cholesterol. He reported adherence to a healthy diet, avoiding fried foods and carbohydrates, and focusing on protein intake. He also mentioned occasional vaping and expressed willingness to stop if it would be beneficial to his health.  ROS: Denies chest pain, palpitations, shortness of breath, fatigue, dizziness, syncope, near syncope.   Studies Reviewed: .    Cardiac Studies & Procedures   CARDIAC CATHETERIZATION  CARDIAC CATHETERIZATION 07/15/2023  Narrative Images from the original result were not included. Coronary angiography 07/15/2023: LM: Normal LAD: Prior  proximal LAD stent (placed 2005) 40-50% restenosis in proximal half of the stent 95% moderate calcific stenosis, partly inside the distal edge of the prior stent, and partly in the native artery just distal to the stent Lcx: Patent proximal stent (placed 08/2022) RCA: Minimal luminal  irregularities  LVEDP 13 mmHg  Successful percutaneous coronary intervention mid LAD Intravascular ultrasound (IVUS) PTCA and stent placement 2.5 X 16 mm Synergy drug-eluting stent Post dilatation using 3.0X15 mm [place] balloon up to 18 atm    [name], MD  Findings Coronary Findings Diagnostic  Dominance: Right  Left Anterior Descending Vessel is angiographically normal. Ost LAD to Prox LAD lesion is 25% stenosed. Prox LAD lesion is 40% stenosed. The lesion was previously treated . Prox LAD to Mid LAD lesion is 95% stenosed. The lesion is mildly calcified.  Left Circumflex Non-stenotic Prox Cx lesion was previously treated.  Right Coronary Artery There is mild diffuse disease throughout the vessel.  Intervention  Prox LAD to Mid LAD lesion Stent CATH LAUNCHER 6FR EBU3.5 guide catheter was inserted. Lesion crossed with guidewire using a WIRE ASAHI PROWATER 180CM. Pre-stent angioplasty was performed using a BALLN WOLVERINE 2.50X6. A drug-eluting stent was successfully placed using a SYNERGY XD 2.50X16. Maximum pressure: 14 atm. Inflation time: 40 sec. Stent strut is well apposed. Stent overlaps previously placed stent. Post-stent angioplasty was performed using a BALL SAPPHIRE NC24 3.0X15. Maximum pressure:  18 atm. Inflation time:  20 sec. Post-Intervention Lesion Assessment The intervention was successful. Pre-interventional TIMI flow is 2. Post-intervention TIMI flow is 3. No complications occurred at this lesion. Ultrasound (IVUS) was performed on the lesion post PCI. Stent well apposed. There is a 0% residual stenosis post intervention.   CARDIAC CATHETERIZATION  CARDIAC CATHETERIZATION 08/30/2022  Narrative   Prox Cx lesion is 90% stenosed.   Prox LAD to Mid LAD lesion is 50% stenosed.   Ost LAD to Prox LAD lesion is 25% stenosed.   A stent was successfully placed.   Post intervention, there is a 0% residual stenosis.  1.  Patent proximal LAD stent with  moderate in-stent restenosis with an RFR of 0.92; intervention was therefore deferred. 2.  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 3.  LVEDP of 20 mmHg  Recommendation: Due to ongoing chest discomfort likely due to arterial stretch we will monitor the patient overnight with plan discharge in the morning.  Plan on preferably 6 months of dual antiplatelet therapy and then Plavix monotherapy thereafter.  Findings Coronary Findings Diagnostic  Dominance: Right  Left Anterior Descending Vessel is angiographically normal. Ost LAD to Prox LAD lesion is 25% stenosed. Prox LAD to Mid LAD lesion is 50% stenosed. The lesion was previously treated .  Left Circumflex Prox Cx lesion is 90% stenosed.  Right Coronary Artery There is mild diffuse disease throughout the vessel.  Intervention  Prox Cx lesion Stent A stent was successfully placed. Post-Intervention Lesion Assessment The intervention was successful. Pre-interventional TIMI flow is 3. Post-intervention TIMI flow is 3. There is a 0% residual stenosis post intervention.   STRESS TESTS  MYOCARDIAL PERFUSION IMAGING 07/07/2023  Narrative   Findings are consistent with infarction with peri-infarct ischemia. The study is intermediate risk.   No ST deviation was noted.   LV perfusion is abnormal. There is evidence of ischemia. There is evidence of infarction. Defect 1: There is a large defect with severe reduction in uptake present in the apical to basal septal  and apex location(s) that is partially reversible. There is abnormal wall motion in the defect area. Consistent with infarction and peri-infarct ischemia.   Left ventricular function is abnormal. Global function is mildly reduced. Nuclear stress EF: 51%. The left ventricular ejection fraction is mildly decreased (45-54%). End diastolic cavity size is normal. End systolic cavity size is normal.   Prior study  not available for comparison.  Large septal infarct with minimal peri-infarct ischemia. RV  tracer uptake also noted.               Risk Assessment/Calculations:             Physical Exam:   VS:  BP 128/62 (BP Location: Left Arm, Patient Position: Sitting, Cuff Size: Normal)   Pulse 66   Ht 5\' 4"  (1.626 m)   Wt 191 lb 3.2 oz (86.7 kg)   SpO2 98%   BMI 32.82 kg/m    Wt Readings from Last 3 Encounters:  07/30/23 191 lb 3.2 oz (86.7 kg)  07/15/23 190 lb (86.2 kg)  07/07/23 194 lb (88 kg)    GEN: Well nourished, well developed in no acute distress. Sitting comfortably in the chair  NECK: No JVD; No carotid bruits CARDIAC: RRR, no murmurs, rubs, gallops. Radial pulses 2+ bilaterally. Right radial cath site is soft, nontender.  RESPIRATORY:  Clear to auscultation without rales, wheezing or rhonchi. Normal work of breathing on room air  ABDOMEN: Soft, non-tender, non-distended EXTREMITIES:  No edema in BLE; No deformity   ASSESSMENT AND PLAN: .    CAD  - Patient previously had DES placed to his mid LAD in 2005. Later had DES placed to the proximal left circumflex in 08/2022  - Patient most recently underwent catheterization on 07/15/23 that showed 95% calcific stenosis in the mid LAD, partly inside the distal edge of the prior LAD stent. Underwent PTCA and DES placement to the mid LAD. The previously placed prox Lcx stent was patent - Reports that he has been doing well since his stent was placed. Denies chest pain, shortness of breath, fatigue. Right radial cath site is soft, nontender, healing well  - He is very concerned about his CV risk moving forward- we discussed at length the locations of his stents, treatment for his cholesterol and BP, and increasing physical activity as tolerated. Discussed that the majority of his modifiable risk factors are fairly well controlled. LDL 37. BP well controlled. Patient follows a healthy diet and stays active in his daily life. Admits to  occasional vaping and we discussed cessation  - Ordered echocardiogram to assess wall motion, EF after catheterization- nuclear stress test pre-cath had shown mildly reduced EF  - Continue ASA, plavix for at least 6 months - patient denies bleeding or bruising  - Continue imdur 90 mg daily - continue crestor 40 mg daily   HTN  - BP well controlled. Denies symptoms of hypotension  - Continue chlorthalidone 12.5 mg daily, diltiazem 240 mg daily, irbesartan 75 mg daily, imdur 90 mg daily  - Creatinine 1.01 and K 4.6 on 07/10/23   HLD  - LDL was 37 in 09/2022  - Continue crestor 40 mg daily  - Ordered LP(a)    Dispo: Keep follow up appointment with Dr. Jens Som in 09/2022   Signed, Jonita Albee, PA-C

## 2023-07-30 ENCOUNTER — Ambulatory Visit: Payer: PPO | Attending: Cardiology | Admitting: Cardiology

## 2023-07-30 ENCOUNTER — Encounter: Payer: Self-pay | Admitting: Cardiology

## 2023-07-30 VITALS — BP 128/62 | HR 66 | Ht 64.0 in | Wt 191.2 lb

## 2023-07-30 DIAGNOSIS — I2510 Atherosclerotic heart disease of native coronary artery without angina pectoris: Secondary | ICD-10-CM

## 2023-07-30 DIAGNOSIS — R0602 Shortness of breath: Secondary | ICD-10-CM

## 2023-07-30 DIAGNOSIS — I10 Essential (primary) hypertension: Secondary | ICD-10-CM

## 2023-07-30 DIAGNOSIS — Z955 Presence of coronary angioplasty implant and graft: Secondary | ICD-10-CM

## 2023-07-30 DIAGNOSIS — E785 Hyperlipidemia, unspecified: Secondary | ICD-10-CM | POA: Diagnosis not present

## 2023-07-30 NOTE — Patient Instructions (Signed)
Medication Instructions:  No changes *If you need a refill on your cardiac medications before your next appointment, please call your pharmacy*   Lab Work: Today we will need to drawn LPa If you have labs (blood work) drawn today and your tests are completely normal, you will receive your results only by: MyChart Message (if you have MyChart) OR A paper copy in the mail If you have any lab test that is abnormal or we need to change your treatment, we will call you to review the results.   Testing/Procedures: Your physician has requested that you have an echocardiogram. Echocardiography is a painless test that uses sound waves to create images of your heart. It provides your doctor with information about the size and shape of your heart and how well your heart's chambers and valves are working. This procedure takes approximately one hour. There are no restrictions for this procedure. Please do NOT wear cologne, perfume, aftershave, or lotions (deodorant is allowed). Please arrive 15 minutes prior to your appointment time.  Please note: We ask at that you not bring children with you during ultrasound (echo/ vascular) testing. Due to room size and safety concerns, children are not allowed in the ultrasound rooms during exams. Our front office staff cannot provide observation of children in our lobby area while testing is being conducted. An adult accompanying a patient to their appointment will only be allowed in the ultrasound room at the discretion of the ultrasound technician under special circumstances. We apologize for any inconvenience.    Follow-Up: At [facility], you and your health needs are our priority.  As part of our continuing mission to provide you with exceptional heart care, we have created designated Provider Care Teams.  These Care Teams include your primary Cardiologist (physician) and Advanced Practice Providers (APPs -  Physician Assistants and Nurse  Practitioners) who all work together to provide you with the care you need, when you need it.  We recommend signing up for the patient portal called "MyChart".  Sign up information is provided on this After Visit Summary.  MyChart is used to connect with patients for Virtual Visits (Telemedicine).  Patients are able to view lab/test results, encounter notes, upcoming appointments, etc.  Non-urgent messages can be sent to your provider as well.   To learn more about what you can do with MyChart, go to [email or web address].    Your next appointment:   February 12th at 9:00 am with Olga Millers, MD at [facility]

## 2023-07-31 LAB — LIPOPROTEIN A (LPA): Lipoprotein (a): 39 nmol/L (ref <75.0)

## 2023-08-01 NOTE — Addendum Note (Signed)
Addended by: Clotilde Dieter on: 08/01/2023 09:25 AM   Modules accepted: Orders

## 2023-08-04 ENCOUNTER — Ambulatory Visit (HOSPITAL_COMMUNITY): Payer: PPO | Attending: Cardiology

## 2023-08-04 DIAGNOSIS — R0602 Shortness of breath: Secondary | ICD-10-CM | POA: Insufficient documentation

## 2023-08-04 DIAGNOSIS — M47896 Other spondylosis, lumbar region: Secondary | ICD-10-CM | POA: Diagnosis not present

## 2023-08-04 DIAGNOSIS — M545 Low back pain, unspecified: Secondary | ICD-10-CM | POA: Diagnosis not present

## 2023-08-04 LAB — ECHOCARDIOGRAM COMPLETE
Area-P 1/2: 3.48 cm2
S' Lateral: 3.6 cm

## 2023-08-04 MED ORDER — PERFLUTREN LIPID MICROSPHERE
1.0000 mL | INTRAVENOUS | Status: AC | PRN
  Administered 2023-08-04: 2 mL via INTRAVENOUS

## 2023-08-07 ENCOUNTER — Telehealth: Payer: Self-pay | Admitting: Cardiology

## 2023-08-07 ENCOUNTER — Telehealth: Payer: Self-pay

## 2023-08-07 MED ORDER — CARVEDILOL 12.5 MG PO TABS: 12.5000 mg | ORAL_TABLET | Freq: Two times a day (BID) | ORAL | 3 refills | Status: DC

## 2023-08-07 NOTE — Telephone Encounter (Signed)
Patient is returning phone call.  °

## 2023-08-07 NOTE — Telephone Encounter (Signed)
-----   Message from Jonita Albee sent at 08/06/2023 12:08 PM EST ----- I am resulting this on mychart at patient's request- please call him to discuss med change  "Your echocardiogram showed that your ejection fraction (measurement of the pumping function of the heart) is a little weak at 45%. Normal is around 55-60%. There are some regional wall motion abnormalities present. This means that there were areas of the heart muscle that were at one time, not getting the blood flow that they needed and the muscle became weak. Overall- I suspect that the blockage in your LAD (blood vessel around your heart that was recently stented) had been developing for a while. Now that the blood vessel has been opened up/stented, I hope and expect that these wall motion abnormalities will improve. Similarly, I anticipate that your heart will regain its pumping strength since normal blood flow is resorted.   There are some medications we can use to help your heart regain its strength as well. You are already on irbesartan, which is one of these medications. I would recommend that we stop diltiazem and instead start carvedilol 12.5 mg BID, as carvedilol is a better medication for patients with low EF   The right ventricle (which pumps blood to the lungs) is functioning normally. The mitral valve is normal. The aortic valve is normal. "   Stop diltiazem, start carvedilol 12.5 mg BID.  He should also keep a BP log and send back in 2 weeks to make sure that his BP is staying well controlled

## 2023-08-07 NOTE — Telephone Encounter (Signed)
Error

## 2023-08-07 NOTE — Telephone Encounter (Signed)
Patient identification verified by 2 forms. Marilynn Rail, RN    Called and spoke to patient  Relayed provider message below  Advised patient:  -discontinue Diltiazem   -start carvedilol 12.5mg  BID   -continue Irbesartan   -Check BP daily, send BP log via mychart  Patient verbalized understanding, no questions at this time

## 2023-08-07 NOTE — Telephone Encounter (Signed)
Left message to call back  

## 2023-08-11 ENCOUNTER — Other Ambulatory Visit: Payer: Self-pay | Admitting: Cardiology

## 2023-08-17 ENCOUNTER — Encounter: Payer: Self-pay | Admitting: Cardiology

## 2023-08-18 NOTE — Telephone Encounter (Signed)
Patient identification verified by 2 forms. Marilynn Rail, RN    Called and spoke to patient  Patient states:   -is concerned he is taking too many blood pressure medications   -wants to be sure is medication list is up to date   -concerned pharmacy is not being notified of changes to medication list   -he is not taking Diltiazem 240mg    -unsure if he is supposed to refill Irbesartan 150mg    -he is currently driving unable to review medications at this time  Informed patient message sent to provider for input/advisement  Patient verbalized understanding, no questions at this time  Patient provided images of current home medications:

## 2023-08-18 NOTE — Telephone Encounter (Signed)
Please see alternate 12/22 mychart message "Blood pressure meds.Marland KitchenMarland Kitchen?" for documentation.

## 2023-08-18 NOTE — Telephone Encounter (Signed)
Please see alternate 12/22 "Blood pressure meds..?" For documentation

## 2023-08-19 ENCOUNTER — Encounter: Payer: Self-pay | Admitting: Family Medicine

## 2023-08-19 DIAGNOSIS — N281 Cyst of kidney, acquired: Secondary | ICD-10-CM

## 2023-08-19 DIAGNOSIS — M545 Low back pain, unspecified: Secondary | ICD-10-CM | POA: Diagnosis not present

## 2023-08-19 NOTE — Telephone Encounter (Signed)
Jadden, Lahaye, PA-C to Me     08/19/23  7:43 AM He should be on 90 mg. I will correct my note to make it accurate.  Thanks KJ August 18, 2023 Me to Airam, Raifsnider     08/18/23  4:48 PM How much of the imdur should be taking, would like to clarify there was two doses noted in the 12/4 appointment note. Monty, Caliendo, PA-C to Me     08/18/23  4:42 PM Patient should be taking irbesartan, chlorthalidone, carvedilol, and imdur. We stopped diltiazem a few weeks ago  Thanks KJ

## 2023-08-19 NOTE — Telephone Encounter (Signed)
Patient identification verified by 2 forms. Marilynn Rail, RN    Called and spoke to patient  Informed patient:   -should be taking chlorthalidone, carvedilol, imdur   -he should not be taking diltiazem  Patient requests mychart message sent with dosing, he is currently driving  Patient has no further questions at this time

## 2023-08-25 DIAGNOSIS — N281 Cyst of kidney, acquired: Secondary | ICD-10-CM | POA: Insufficient documentation

## 2023-08-26 ENCOUNTER — Encounter: Payer: Self-pay | Admitting: Cardiology

## 2023-08-26 NOTE — Telephone Encounter (Signed)
 Called and made patient aware that blood pressure result will be forward to his provider. Patient verbalizes an understanding.

## 2023-08-29 DIAGNOSIS — G4733 Obstructive sleep apnea (adult) (pediatric): Secondary | ICD-10-CM | POA: Diagnosis not present

## 2023-09-03 DIAGNOSIS — M47896 Other spondylosis, lumbar region: Secondary | ICD-10-CM | POA: Diagnosis not present

## 2023-09-10 ENCOUNTER — Encounter: Payer: Self-pay | Admitting: Cardiology

## 2023-09-10 ENCOUNTER — Other Ambulatory Visit: Payer: Self-pay | Admitting: Cardiology

## 2023-09-10 MED ORDER — IRBESARTAN 150 MG PO TABS: 75.0000 mg | ORAL_TABLET | Freq: Every day | ORAL | 3 refills | Status: DC

## 2023-09-10 NOTE — Telephone Encounter (Signed)
 Please refill avapro  75 mg daily  Devin Martin

## 2023-09-13 ENCOUNTER — Other Ambulatory Visit: Payer: Self-pay | Admitting: Cardiology

## 2023-09-19 ENCOUNTER — Telehealth: Payer: Self-pay | Admitting: Family Medicine

## 2023-09-19 NOTE — Telephone Encounter (Signed)
E2C2 called stating pt is returning Angelique Blonder' call. Wants a call back from CMA when free.

## 2023-09-22 NOTE — Telephone Encounter (Signed)
Not sure what I was calling him about.  Advised patient I would call him back if I remember. Dm/cma

## 2023-09-25 NOTE — Progress Notes (Signed)
HPI: FU coronary artery disease. The patient underwent cardiac catheterization in December 2005 secondary to a non-ST elevation myocardial infarction. At that time, his ejection fraction was 60%. He had successful PCI of his mid LAD with a drug-eluting stent. Note, there was jailing of 2 small diagonals by the LAD stent.  Abdominal ultrasound 2/17 showed no aneurysm. Patient had repeat catheterization January 2024 due to exertional chest pain.  The stent in his proximal LAD had moderate in-stent restenosis (50) with normal FFR, 90% proximal circumflex lesion treated with drug-eluting stent and LVEDP of 20 mmHg.  Repeat catheterization November 2024 showed 95% in the distal edge of the prior LAD stent, patent stent in the circumflex and minimal irregularities in the right coronary artery; patient had PCI of the LAD.  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  Since I last saw him, he does have some dyspnea on exertion but no chest tightness.  No orthopnea, PND, pedal edema or syncope.  Current Outpatient Medications  Medication Sig Dispense Refill   AMBULATORY NON FORMULARY MEDICATION Prosthetic Leg Liners 2 application 0   AMBULATORY NON FORMULARY MEDICATION at bedtime. ResMed CPAP Machine     Aspirin 81 MG EC tablet Take 1 tablet (81 mg total) by mouth daily.     carvedilol (COREG) 12.5 MG tablet Take 1 tablet (12.5 mg total) by mouth 2 (two) times daily. 180 tablet 3   chlorthalidone (HYGROTON) 25 MG tablet Take 0.5 tablets (12.5 mg total) by mouth daily. 45 tablet 3   Cholecalciferol (VITAMIN D3) 50 MCG (2000 UT) capsule Take 2,000 Units by mouth daily.     clopidogrel (PLAVIX) 75 MG tablet TAKE 1 TABLET(75 MG) BY MOUTH DAILY WITH BREAKFAST 90 tablet 3   irbesartan (AVAPRO) 150 MG tablet Take 0.5 tablets (75 mg total) by mouth daily. Please disregard the 150 mg dosage sent in previous 45  tablet 3   isosorbide mononitrate (IMDUR) 60 MG 24 hr tablet TAKE 1 AND 1/2 TABLETS(90 MG) BY MOUTH EVERY EVENING 135 tablet 3   meloxicam (MOBIC) 15 MG tablet TAKE 1 TABLET(15 MG) BY MOUTH DAILY. PATIENT TAKE AS NEEDED 30 tablet 5   nitroGLYCERIN (NITROSTAT) 0.4 MG SL tablet Place 1 tablet (0.4 mg total) under the tongue every 5 minutes as needed for chest pain. 25 tablet 2   Nitroglycerin 0.4 % OINT Place 1 Application rectally in the morning and at bedtime. Apply 1 inch (375 mg) ointment intra-anally every 12 hours for anal fissure 30 g 1   potassium citrate (UROCIT-K) 10 MEQ (1080 MG) SR tablet TAKE 2 TABLET BY MOUTH DAILY 180 tablet 3   rosuvastatin (CRESTOR) 40 MG tablet TAKE 1 TABLET(40 MG) BY MOUTH DAILY 90 tablet 3   TURMERIC PO Take 1,500-3,000 mg by mouth daily. 1500 mg each alternate every mouth     No current facility-administered medications for this visit.     Past Medical History:  Diagnosis Date   ANXIETY 10/13/2009   Qualifier: Diagnosis of  By: Jens Som, MD, Lyn Hollingshead    Arthritis 1968   pt wears a lt bk prosthesis   Bronchitis, chronic (HCC)    CAD (coronary artery disease)    Non-ST segment elevation myocardial infarction in 2005 with drug-eluting stent to the left anterior descending. 08-2009 - non-ST segment elevation myocardial infarction, felt secondary to vasospasm.   Calculus, kidney    hx   Complication of anesthesia  itching after preop med  ?name - 2013, no SOB   Glaucoma    Hyperlipidemia    Hypertension    Myocardial infarction [facility]) 2005   x2, 2005, 2010   OSA (obstructive sleep apnea)    AHI 68/hr in 2002-uses a cpap   Pneumonia    hx of    Sleep apnea    wears CPAP   Wears glasses     Past Surgical History:  Procedure Laterality Date   AMPUT TRAUM LEG BELOW KNEE UNILT W/O COMPL Left    CARDIAC CATHETERIZATION     CORONARY IMAGING/OCT N/A 08/30/2022   Procedure: INTRAVASCULAR IMAGING/OCT;  Surgeon: Orbie Pyo, MD;   [facility];  Service: Cardiovascular;  Laterality: N/A;   CORONARY PRESSURE/FFR STUDY N/A 08/30/2022   Procedure: INTRAVASCULAR PRESSURE WIRE/FFR STUDY;  Surgeon: Orbie Pyo, MD;  [facility]: [facility];  Service: Cardiovascular;  Laterality: N/A;   CORONARY STENT INTERVENTION N/A 08/30/2022   Procedure: CORONARY STENT INTERVENTION;  Surgeon: Orbie Pyo, MD;  [facility];  Service: Cardiovascular;  Laterality: N/A;   CORONARY STENT INTERVENTION N/A 07/15/2023   Procedure: CORONARY STENT INTERVENTION;  Surgeon: Elder Negus, MD;  [facility];  Service: Cardiovascular;  Laterality: N/A;   CORONARY ULTRASOUND/IVUS N/A 07/15/2023   Procedure: Coronary Ultrasound/IVUS;  Surgeon: Elder Negus, MD;  [facility];  Service: Cardiovascular;  Laterality: N/A;   CYST REMOVAL TRUNK  09/04/2012   Procedure: CYST REMOVAL TRUNK;  Surgeon: Axel Filler, MD;  Location: [facility];  Service: General;  Laterality: N/A;  EXCISION OF MID BACK MASS   EYE SURGERY Left DR.SON   CATARACT SX1/2019   INCISION AND DRAINAGE ABSCESS Left 07/31/2013   Procedure: INCISIONAL/NON-INCISIONAL DEBRIDEMENT OF ABSCESS LEFT KNEE;  Surgeon: Shelda Pal, MD;  Location: [facility];  Service: Orthopedics;  Laterality: Left;   KIDNEY STONE SURGERY     multiple episodes    LEFT HEART CATH AND CORONARY ANGIOGRAPHY N/A 08/30/2022   Procedure: LEFT HEART CATH AND CORONARY ANGIOGRAPHY;  Surgeon: Orbie Pyo, MD;  [facility];  Service: Cardiovascular;  Laterality: N/A;   LEFT HEART CATH AND CORONARY ANGIOGRAPHY N/A 07/15/2023   Procedure: LEFT HEART CATH AND CORONARY ANGIOGRAPHY;  Surgeon: Elder Negus, MD;  [facility];  Service: Cardiovascular;  Laterality: N/A;   Right Knee Arthroscopy  2010   [facility]   TONSILLECTOMY      Social History   Socioeconomic History   Marital  status: Married    Spouse name: Not on file   Number of children: 2   Years of education: Not on file   Highest education level: Not on file  Occupational History   Occupation: Art gallery manager-- RETIRED 2012  Tobacco Use   Smoking status: Former    Current packs/day: 0.00    Average packs/day: 0.8 packs/day for 20.0 years (15.0 ttl pk-yrs)    Types: Cigarettes, E-cigarettes    Start date: 1995    Quit date: 2015    Years since quitting: 10.1   Smokeless tobacco: Never   Tobacco comments:    currently doing some e-cigarettes  Vaping Use   Vaping status: Every Day   Substances: Nicotine  Substance and Sexual Activity   Alcohol use: Yes    Comment: bourbon 3 fingers 3 x/week.   Drug use: No   Sexual activity: Not Currently  Other Topics Concern   Not on file  Social History Narrative   Lives w/ wife    Social Drivers of Health   Financial Resource Strain: Low Risk  (12/13/2022)   Overall Financial Resource Strain (CARDIA)    Difficulty of Paying Living Expenses: Not hard at all  Food Insecurity: No Food Insecurity (12/13/2022)   Hunger Vital Sign    Worried About Running Out of Food in the Last Year: Never true    Ran Out of Food in the Last Year: Never true  Transportation Needs: No Transportation Needs (12/13/2022)   PRAPARE - Administrator, Civil Service (Medical): No    Lack of Transportation (Non-Medical): No  Physical Activity: Sufficiently Active (12/13/2022)   Exercise Vital Sign    Days of Exercise per Week: 7 days    Minutes of Exercise per Session: 90 min  Stress: No Stress Concern Present (12/13/2022)   [employer] of Occupational Health - Occupational Stress Questionnaire    Feeling of Stress : Not at all  Social Connections: Socially Integrated (01/09/2022)   Social Connection and Isolation Panel [NHANES]    Frequency of Communication with Friends and Family: Three times a week    Frequency of Social Gatherings with Friends and Family:  Three times a week    Attends Religious Services: More than 4 times per year    Active Member of Clubs or Organizations: Yes    Attends Banker Meetings: More than 4 times per year    Marital Status: Married  Catering manager Violence: Not At Risk (01/09/2022)   Humiliation, Afraid, Rape, and Kick questionnaire    Fear of Current or Ex-Partner: No    Emotionally Abused: No    Physically Abused: No    Sexually Abused: No    Family History  Problem Relation Age of Onset   Stroke Mother    Cancer Mother        Ovary, Breast   Heart attack Father 65   Heart attack Paternal Grandfather    Colon cancer Neg Hx    Prostate cancer Neg Hx    Diabetes Neg Hx    Esophageal cancer Neg Hx    Rectal cancer Neg Hx    Stomach cancer Neg Hx     ROS: no fevers or chills, productive cough, hemoptysis, dysphasia, odynophagia, melena, hematochezia, dysuria, hematuria, rash, seizure activity, orthopnea, PND, pedal edema, claudication. Remaining systems are negative.  Physical Exam: Well-developed well-nourished in no acute distress.  Skin is warm and dry.  HEENT is normal.  Neck is supple.  Chest is clear to auscultation with normal expansion.  Cardiovascular exam is regular rate and rhythm.  Abdominal exam nontender or distended. No masses palpated. Extremities show no edema.  Status post amputation neuro grossly intact  A/P  1 coronary artery disease-continue aspirin, Plavix and statin.  Patient does describe dyspnea on exertion.  However he is not having chest tightness similar to his symptoms prior to PCI.  Some of his dyspnea may be related to deconditioning as he has not been as active previously.  2 hypertension-patient's blood pressure is controlled.  Continue present medications.  Check lipids, liver and bmet.  3 Hyperlipidemia-continue statin; recent LP(a) normal.  Check lipids and liver.   4 ischemic cardiomyopathy-continue ARB and beta-blocker. EF is 45.  Patient is  not in CHF.  Olga Millers, MD

## 2023-10-03 DIAGNOSIS — M47816 Spondylosis without myelopathy or radiculopathy, lumbar region: Secondary | ICD-10-CM | POA: Diagnosis not present

## 2023-10-08 ENCOUNTER — Encounter: Payer: Self-pay | Admitting: Cardiology

## 2023-10-08 ENCOUNTER — Ambulatory Visit: Payer: PPO | Attending: Cardiology | Admitting: Cardiology

## 2023-10-08 VITALS — BP 120/62 | HR 69 | Ht 64.0 in | Wt 196.1 lb

## 2023-10-08 DIAGNOSIS — E785 Hyperlipidemia, unspecified: Secondary | ICD-10-CM

## 2023-10-08 DIAGNOSIS — Z955 Presence of coronary angioplasty implant and graft: Secondary | ICD-10-CM

## 2023-10-08 DIAGNOSIS — I2510 Atherosclerotic heart disease of native coronary artery without angina pectoris: Secondary | ICD-10-CM

## 2023-10-08 DIAGNOSIS — E7800 Pure hypercholesterolemia, unspecified: Secondary | ICD-10-CM

## 2023-10-08 DIAGNOSIS — I10 Essential (primary) hypertension: Secondary | ICD-10-CM

## 2023-10-08 NOTE — Progress Notes (Unsigned)
[facility] [address] [place] [address] Phone: [phone] Subjective:   Morene Antu am a scribe for Dr. Katrinka Blazing.   I'm seeing this patient by the request  of:  Loyola Mast, MD  CC: back and leg pain   [id number]  Devin Martin is a 78 y.o. male coming in with complaint of lumbar spine pain. Patient states back pain been going on several months. Golf flares it up. Told that is was a sciatic nerve but orthopedic surgeon said it was arthritis in the pelvic area. Wants a second opinion about sciatic nerve. LBP right above his but and then it shoots down the leg. Numbness and tingling present.    CT scan in 2018    Lower lumbar facet arthropathy. Left iliac bone harvesting deformity. Healed right inferior pubic ramus fracture. Right SI joint fusion.  Past Medical History:  Diagnosis Date   ANXIETY 10/13/2009   Qualifier: Diagnosis of  By: Jens Som, MD, Lyn Hollingshead    Arthritis 1968   pt wears a lt bk prosthesis   Bronchitis, chronic (HCC)    CAD (coronary artery disease)    Non-ST segment elevation myocardial infarction in 2005 with drug-eluting stent to the left anterior descending. 08-2009 - non-ST segment elevation myocardial infarction, felt secondary to vasospasm.   Calculus, kidney    hx   Complication of anesthesia    itching after preop med  ?name - 2013, no SOB   Glaucoma    Hyperlipidemia    Hypertension    Myocardial infarction [facility]) 2005   x2, 2005, 2010   OSA (obstructive sleep apnea)    AHI 68/hr in 2002-uses a cpap   Pneumonia    hx of    Sleep apnea    wears CPAP   Wears glasses    Past Surgical History:  Procedure Laterality Date   AMPUT TRAUM LEG BELOW KNEE UNILT W/O COMPL Left    CARDIAC CATHETERIZATION     CORONARY IMAGING/OCT N/A 08/30/2022   Procedure: INTRAVASCULAR IMAGING/OCT;  Surgeon: Orbie Pyo, MD;  [facility];  Service: Cardiovascular;  Laterality: N/A;    CORONARY PRESSURE/FFR STUDY N/A 08/30/2022   Procedure: INTRAVASCULAR PRESSURE WIRE/FFR STUDY;  Surgeon: Orbie Pyo, MD;  [facility];  Service: Cardiovascular;  Laterality: N/A;   CORONARY STENT INTERVENTION N/A 08/30/2022   Procedure: CORONARY STENT INTERVENTION;  Surgeon: Orbie Pyo, MD;  [facility];  Service: Cardiovascular;  Laterality: N/A;   CORONARY STENT INTERVENTION N/A 07/15/2023   Procedure: CORONARY STENT INTERVENTION;  Surgeon: Elder Negus, MD;  [facility];  Service: Cardiovascular;  Laterality: N/A;   CORONARY ULTRASOUND/IVUS N/A 07/15/2023   Procedure: Coronary Ultrasound/IVUS;  Surgeon: Elder Negus, MD;  [facility];  Service: Cardiovascular;  Laterality: N/A;   CYST REMOVAL TRUNK  09/04/2012   Procedure: CYST REMOVAL TRUNK;  Surgeon: Axel Filler, MD;  Location: [facility];  Service: General;  Laterality: N/A;  EXCISION OF MID BACK MASS   EYE SURGERY Left DR.SON   CATARACT SX1/2019   INCISION AND DRAINAGE ABSCESS Left 07/31/2013   Procedure: INCISIONAL/NON-INCISIONAL DEBRIDEMENT OF ABSCESS LEFT KNEE;  Surgeon: Shelda Pal, MD;  Location: [facility];  Service: Orthopedics;  Laterality: Left;   KIDNEY STONE SURGERY     multiple episodes    LEFT HEART CATH AND CORONARY ANGIOGRAPHY N/A 08/30/2022   Procedure:  LEFT HEART CATH AND CORONARY ANGIOGRAPHY;  Surgeon: Orbie Pyo, MD;  [facility];  Service: Cardiovascular;  Laterality: N/A;   LEFT HEART CATH AND CORONARY ANGIOGRAPHY N/A 07/15/2023   Procedure: LEFT HEART CATH AND CORONARY ANGIOGRAPHY;  Surgeon: Elder Negus, MD;  [facility];  Service: Cardiovascular;  Laterality: N/A;   Right Knee Arthroscopy  2010   [facility]   TONSILLECTOMY     Social History   Socioeconomic History   Marital status: Married    Spouse name: Not on file   Number of children: 2   Years of  education: Not on file   Highest education level: Not on file  Occupational History   Occupation: Art gallery manager-- RETIRED 2012  Tobacco Use   Smoking status: Former    Current packs/day: 0.00    Average packs/day: 0.8 packs/day for 20.0 years (15.0 ttl pk-yrs)    Types: Cigarettes, E-cigarettes    Start date: 1995    Quit date: 2015    Years since quitting: 10.1   Smokeless tobacco: Never   Tobacco comments:    currently doing some e-cigarettes  Vaping Use   Vaping status: Every Day   Substances: Nicotine  Substance and Sexual Activity   Alcohol use: Yes    Comment: bourbon 3 fingers 3 x/week.   Drug use: No   Sexual activity: Not Currently  Other Topics Concern   Not on file  Social History Narrative   Lives w/ wife    Social Drivers of Health   Financial Resource Strain: Low Risk  (12/13/2022)   Overall Financial Resource Strain (CARDIA)    Difficulty of Paying Living Expenses: Not hard at all  Food Insecurity: No Food Insecurity (12/13/2022)   Hunger Vital Sign    Worried About Running Out of Food in the Last Year: Never true    Ran Out of Food in the Last Year: Never true  Transportation Needs: No Transportation Needs (12/13/2022)   PRAPARE - Administrator, Civil Service (Medical): No    Lack of Transportation (Non-Medical): No  Physical Activity: Sufficiently Active (12/13/2022)   Exercise Vital Sign    Days of Exercise per Week: 7 days    Minutes of Exercise per Session: 90 min  Stress: No Stress Concern Present (12/13/2022)   [employer] of Occupational Health - Occupational Stress Questionnaire    Feeling of Stress : Not at all  Social Connections: Socially Integrated (01/09/2022)   Social Connection and Isolation Panel [NHANES]    Frequency of Communication with Friends and Family: Three times a week    Frequency of Social Gatherings with Friends and Family: Three times a week    Attends Religious Services: More than 4 times per year     Active Member of Clubs or Organizations: Yes    Attends Engineer, structural: More than 4 times per year    Marital Status: Married   Allergies  Allergen Reactions   Penicillins Hives and Other (See Comments)    syncope   Tylox [Oxycodone-Acetaminophen] Other (See Comments)    "messes me up"   Codeine Nausea Only   Family History  Problem Relation Age of Onset   Stroke Mother    Cancer Mother        Ovary, Breast   Heart attack Father 74   Heart attack Paternal Grandfather    Colon cancer Neg Hx    Prostate cancer Neg Hx  Diabetes Neg Hx    Esophageal cancer Neg Hx    Rectal cancer Neg Hx    Stomach cancer Neg Hx      Current Outpatient Medications (Cardiovascular):    carvedilol (COREG) 12.5 MG tablet, Take 1 tablet (12.5 mg total) by mouth 2 (two) times daily.   chlorthalidone (HYGROTON) 25 MG tablet, Take 0.5 tablets (12.5 mg total) by mouth daily.   irbesartan (AVAPRO) 150 MG tablet, Take 0.5 tablets (75 mg total) by mouth daily. Please disregard the 150 mg dosage sent in previous   isosorbide mononitrate (IMDUR) 60 MG 24 hr tablet, TAKE 1 AND 1/2 TABLETS(90 MG) BY MOUTH EVERY EVENING   nitroGLYCERIN (NITROSTAT) 0.4 MG SL tablet, Place 1 tablet (0.4 mg total) under the tongue every 5 minutes as needed for chest pain.   rosuvastatin (CRESTOR) 40 MG tablet, TAKE 1 TABLET(40 MG) BY MOUTH DAILY   Current Outpatient Medications (Analgesics):    Aspirin 81 MG EC tablet, Take 1 tablet (81 mg total) by mouth daily.   meloxicam (MOBIC) 15 MG tablet, TAKE 1 TABLET(15 MG) BY MOUTH DAILY. PATIENT TAKE AS NEEDED  Current Outpatient Medications (Hematological):    clopidogrel (PLAVIX) 75 MG tablet, TAKE 1 TABLET(75 MG) BY MOUTH DAILY WITH BREAKFAST  Current Outpatient Medications (Other):    AMBULATORY NON FORMULARY MEDICATION, Prosthetic Leg Liners   AMBULATORY NON FORMULARY MEDICATION, at bedtime. ResMed CPAP Machine   Cholecalciferol (VITAMIN D3) 50 MCG (2000 UT)  capsule, Take 2,000 Units by mouth daily.   Nitroglycerin 0.4 % OINT, Place 1 Application rectally in the morning and at bedtime. Apply 1 inch (375 mg) ointment intra-anally every 12 hours for anal fissure   potassium citrate (UROCIT-K) 10 MEQ (1080 MG) SR tablet, TAKE 2 TABLET BY MOUTH DAILY   TURMERIC PO, Take 1,500-3,000 mg by mouth daily. 1500 mg each alternate every mouth   Reviewed prior external information including notes and imaging from  primary care provider As well as notes that were available from care everywhere and other healthcare systems.  Past medical history, social, surgical and family history all reviewed in electronic medical record.  No pertanent information unless stated regarding to the chief complaint.   Review of Systems:  No headache, visual changes, nausea, vomiting, diarrhea, constipation, dizziness, abdominal pain, skin rash, fevers, chills, night sweats, weight loss, swollen lymph nodes, body aches, joint swelling, chest pain, shortness of breath, mood changes. POSITIVE muscle aches  Objective  Blood pressure 130/70, pulse 80, height 5\' 4"  (1.626 m), weight 196 lb 9.6 oz (89.2 kg), SpO2 96%.   General: No apparent distress alert and oriented x3 mood and affect normal, dressed appropriately.  HEENT: Pupils equal, extraocular movements intact  Respiratory: Patient's speak in full sentences and does not appear short of breath  Cardiovascular: No lower extremity edema, non tender, no erythema  Low back exam shows relatively good range of motion.  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  Wearing a orthosis.  Does have a ligament discrepancy of nearly 1 inch.  He does not have a standing.  This causes gait noted. Patient does have some weakness noted over the piriformis right side of the hip abductors.  Neurovascularly intact distally on the right side.  Negative straight leg test.  [id number]; 15 additional minutes spent for Therapeutic exercises  as stated in above notes.  This included exercises focusing on stretching, strengthening, with significant focus on eccentric aspects.   Long term goals include an  improvement in range of motion, strength, endurance as well as avoiding reinjury. Patient's frequency would include in 1-2 times a day, 3-5 times a week for a duration of 6-12 weeks. Using Netter's Orthopaedic Anatomy, reviewed with the patient the structures involved and how they related to diagnosis. The patient indicated understanding.   The patient was given a handout about classic piriformis stretching including [facility], Modified [facility] [facility], my self-described "Sink Stretch," and other piriformis rehab.  We also reviewed hip flexor and abductor strengthening, ham stretching  Rec deep massage, explained self-massage with ball   Proper technique shown and discussed handout in great detail with ATC.  All questions were discussed and answered.     Impression and Recommendations:    The above documentation has been reviewed and is accurate and complete Judi Saa, DO

## 2023-10-08 NOTE — Patient Instructions (Signed)
Follow-Up: At [facility], you and your health needs are our priority.  As part of our continuing mission to provide you with exceptional heart care, we have created designated Provider Care Teams.  These Care Teams include your primary Cardiologist (physician) and Advanced Practice Providers (APPs -  Physician Assistants and Nurse Practitioners) who all work together to provide you with the care you need, when you need it.    Your next appointment:   6 month(s)  Provider:   Olga Millers, MD

## 2023-10-09 ENCOUNTER — Encounter: Payer: Self-pay | Admitting: Family Medicine

## 2023-10-09 ENCOUNTER — Encounter: Payer: Self-pay | Admitting: Cardiology

## 2023-10-09 ENCOUNTER — Ambulatory Visit: Payer: PPO | Admitting: Family Medicine

## 2023-10-09 VITALS — BP 130/70 | HR 80 | Ht 64.0 in | Wt 196.6 lb

## 2023-10-09 DIAGNOSIS — G5701 Lesion of sciatic nerve, right lower limb: Secondary | ICD-10-CM

## 2023-10-09 LAB — COMPREHENSIVE METABOLIC PANEL
ALT: 16 [IU]/L (ref 0–44)
AST: 16 [IU]/L (ref 0–40)
Albumin: 4.4 g/dL (ref 3.8–4.8)
Alkaline Phosphatase: 79 [IU]/L (ref 44–121)
BUN/Creatinine Ratio: 23 (ref 10–24)
BUN: 23 mg/dL (ref 8–27)
Bilirubin Total: 0.6 mg/dL (ref 0.0–1.2)
CO2: 24 mmol/L (ref 20–29)
Calcium: 9.7 mg/dL (ref 8.6–10.2)
Chloride: 101 mmol/L (ref 96–106)
Creatinine, Ser: 0.99 mg/dL (ref 0.76–1.27)
Globulin, Total: 1.8 g/dL (ref 1.5–4.5)
Glucose: 98 mg/dL (ref 70–99)
Potassium: 4.7 mmol/L (ref 3.5–5.2)
Sodium: 139 mmol/L (ref 134–144)
Total Protein: 6.2 g/dL (ref 6.0–8.5)
eGFR: 79 mL/min/{1.73_m2} (ref >59)

## 2023-10-09 LAB — LIPID PANEL
Chol/HDL Ratio: 2.1 {ratio} (ref 0.0–5.0)
Cholesterol, Total: 117 mg/dL (ref 100–199)
HDL: 56 mg/dL (ref >39)
LDL Chol Calc (NIH): 43 mg/dL (ref 0–99)
Triglycerides: 95 mg/dL (ref 0–149)
VLDL Cholesterol Cal: 18 mg/dL (ref 5–40)

## 2023-10-09 NOTE — Assessment & Plan Note (Signed)
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  Discussed with patient that icing regimen and home exercises.  Do believe the patient's piriformis is likely causing some impingement of the sciatic nerve.  We discussed icing regimen and home exercises otherwise, discussed which activities to do and which ones to avoid.  Discussed increasing activity slowly.  Follow-up with me again in 6 to 8 weeks.  Worsening pain consider an injection in the piriformis if necessary for diagnostic and therapeutic purposes

## 2023-10-09 NOTE — Patient Instructions (Addendum)
Exercises CBD Orthotist to get leg shortened by 1/8 to 1/4 of an inch Return in 6-8 weeks

## 2023-10-17 ENCOUNTER — Encounter: Payer: Self-pay | Admitting: Family Medicine

## 2023-10-21 ENCOUNTER — Ambulatory Visit: Payer: PPO | Admitting: Family Medicine

## 2023-11-09 ENCOUNTER — Encounter: Payer: Self-pay | Admitting: Family Medicine

## 2023-11-17 DIAGNOSIS — H53453 Other localized visual field defect, bilateral: Secondary | ICD-10-CM | POA: Diagnosis not present

## 2023-11-17 DIAGNOSIS — H26492 Other secondary cataract, left eye: Secondary | ICD-10-CM | POA: Diagnosis not present

## 2023-11-17 DIAGNOSIS — H43811 Vitreous degeneration, right eye: Secondary | ICD-10-CM | POA: Diagnosis not present

## 2023-11-17 DIAGNOSIS — Z961 Presence of intraocular lens: Secondary | ICD-10-CM | POA: Diagnosis not present

## 2023-11-17 NOTE — Progress Notes (Unsigned)
 [facility] [address] [place] [address] Phone: [phone] Subjective:   Bruce Donath, am serving as a scribe for Dr. Antoine Primas.  I'm seeing this patient by the request  of:  Loyola Mast, MD  CC: Back pain, leg pain follow-up  [id number]  10/09/2023 Patient does have piriformis syndrome noted.  Discussed with patient that icing regimen and home exercises.  Do believe the patient's piriformis is likely causing some impingement of the sciatic nerve.  We discussed icing regimen and home exercises otherwise, discussed which activities to do and which ones to avoid.  Discussed increasing activity slowly.  Follow-up with me again in 6 to 8 weeks.  Worsening pain consider an injection in the piriformis if necessary for diagnostic and therapeutic purposes     Updated 11/19/2023 Devin Martin is a 78 y.o. male coming in with complaint of piriformis pain. Patient states that he is doing better. Saw prosthetic practitioners and they lengthened his left leg. Has been doing HEP as well. Playing golf now without pain. Would like more specific exercises for SI joint as he does have some pain in lower back.   Having a hard time losing weight. Would like recommendations to move in a different direction.        Past Medical History:  Diagnosis Date   ANXIETY 10/13/2009   Qualifier: Diagnosis of  By: Jens Som, MD, Lyn Hollingshead    Arthritis 1968   pt wears a lt bk prosthesis   Bronchitis, chronic (HCC)    CAD (coronary artery disease)    Non-ST segment elevation myocardial infarction in 2005 with drug-eluting stent to the left anterior descending. 08-2009 - non-ST segment elevation myocardial infarction, felt secondary to vasospasm.   Calculus, kidney    hx   Complication of anesthesia    itching after preop med  ?name - 2013, no SOB   Glaucoma    Hyperlipidemia    Hypertension    Myocardial infarction [facility]) 2005   x2, 2005, 2010    OSA (obstructive sleep apnea)    AHI 68/hr in 2002-uses a cpap   Pneumonia    hx of    Sleep apnea    wears CPAP   Wears glasses    Past Surgical History:  Procedure Laterality Date   AMPUT TRAUM LEG BELOW KNEE UNILT W/O COMPL Left    CARDIAC CATHETERIZATION     CORONARY IMAGING/OCT N/A 08/30/2022   Procedure: INTRAVASCULAR IMAGING/OCT;  Surgeon: Orbie Pyo, MD;  Location: [facility];  Service: Cardiovascular;  Laterality: N/A;   CORONARY PRESSURE/FFR STUDY N/A 08/30/2022   Procedure: INTRAVASCULAR PRESSURE WIRE/FFR STUDY;  Surgeon: Orbie Pyo, MD;  Location: [facility];  Service: Cardiovascular;  Laterality: N/A;   CORONARY STENT INTERVENTION N/A 08/30/2022   Procedure: CORONARY STENT INTERVENTION;  Surgeon: Orbie Pyo, MD;  Location: [facility];  Service: Cardiovascular;  Laterality: N/A;   CORONARY STENT INTERVENTION N/A 07/15/2023   Procedure: CORONARY STENT INTERVENTION;  Surgeon: Elder Negus, MD;  Location: [facility];  Service: Cardiovascular;  Laterality: N/A;   CORONARY ULTRASOUND/IVUS N/A 07/15/2023   Procedure: Coronary Ultrasound/IVUS;  Surgeon: Elder Negus, MD;  Location: [facility];  Service: Cardiovascular;  Laterality: N/A;   CYST REMOVAL TRUNK  09/04/2012   Procedure: CYST REMOVAL TRUNK;  Surgeon: Axel Filler, MD;  Location: [facility];  Service: General;  Laterality: N/A;  EXCISION OF MID BACK MASS   EYE SURGERY Left DR.SON   CATARACT SX1/2019   INCISION AND DRAINAGE ABSCESS Left 07/31/2013   Procedure: INCISIONAL/NON-INCISIONAL DEBRIDEMENT OF ABSCESS LEFT KNEE;  Surgeon: Shelda Pal, MD;  Location: [facility];  Service: Orthopedics;  Laterality: Left;   KIDNEY STONE SURGERY     multiple episodes    LEFT HEART CATH AND CORONARY ANGIOGRAPHY N/A 08/30/2022   Procedure: LEFT HEART CATH AND CORONARY ANGIOGRAPHY;  Surgeon: Orbie Pyo, MD;  Location: [facility];  Service:  Cardiovascular;  Laterality: N/A;   LEFT HEART CATH AND CORONARY ANGIOGRAPHY N/A 07/15/2023   Procedure: LEFT HEART CATH AND CORONARY ANGIOGRAPHY;  Surgeon: Elder Negus, MD;  Location: [facility];  Service: Cardiovascular;  Laterality: N/A;   Right Knee Arthroscopy  2010   [facility]   TONSILLECTOMY     Social History   Socioeconomic History   Marital status: Married    Spouse name: Not on file   Number of children: 2   Years of education: Not on file   Highest education level: Not on file  Occupational History   Occupation: Art gallery manager-- RETIRED 2012  Tobacco Use   Smoking status: Former    Current packs/day: 0.00    Average packs/day: 0.8 packs/day for 20.0 years (15.0 ttl pk-yrs)    Types: Cigarettes, E-cigarettes    Start date: 1995    Quit date: 2015    Years since quitting: 10.2   Smokeless tobacco: Never   Tobacco comments:    currently doing some e-cigarettes  Vaping Use   Vaping status: Every Day   Substances: Nicotine  Substance and Sexual Activity   Alcohol use: Yes    Comment: bourbon 3 fingers 3 x/week.   Drug use: No   Sexual activity: Not Currently  Other Topics Concern   Not on file  Social History Narrative   Lives w/ wife    Social Drivers of Health   Financial Resource Strain: Low Risk  (12/13/2022)   Overall Financial Resource Strain (CARDIA)    Difficulty of Paying Living Expenses: Not hard at all  Food Insecurity: No Food Insecurity (12/13/2022)   Hunger Vital Sign    Worried About Running Out of Food in the Last Year: Never true    Ran Out of Food in the Last Year: Never true  Transportation Needs: No Transportation Needs (12/13/2022)   PRAPARE - Administrator, Civil Service (Medical): No    Lack of Transportation (Non-Medical): No  Physical Activity: Sufficiently Active (12/13/2022)   Exercise Vital Sign    Days of Exercise per Week: 7 days    Minutes of Exercise per Session: 90 min  Stress: No Stress  Concern Present (12/13/2022)   [employer] of Occupational Health - Occupational Stress Questionnaire    Feeling of Stress : Not at all  Social Connections: Socially Integrated (01/09/2022)   Social Connection and Isolation Panel [NHANES]    Frequency of Communication with Friends and Family: Three times a week    Frequency of Social Gatherings with Friends and Family: Three times a week    Attends Religious Services: More than 4 times per year    Active Member of Clubs or Organizations: Yes    Attends Banker Meetings: More than 4 times per year    Marital Status: Married   Allergies  Allergen Reactions   Penicillins Hives and Other (See Comments)    syncope  Tylox [Oxycodone-Acetaminophen] Other (See Comments)    "messes me up"   Codeine Nausea Only   Family History  Problem Relation Age of Onset   Stroke Mother    Cancer Mother        Ovary, Breast   Heart attack Father 55   Heart attack Paternal Grandfather    Colon cancer Neg Hx    Prostate cancer Neg Hx    Diabetes Neg Hx    Esophageal cancer Neg Hx    Rectal cancer Neg Hx    Stomach cancer Neg Hx      Current Outpatient Medications (Cardiovascular):    chlorthalidone (HYGROTON) 25 MG tablet, Take 0.5 tablets (12.5 mg total) by mouth daily.   irbesartan (AVAPRO) 150 MG tablet, Take 0.5 tablets (75 mg total) by mouth daily. Please disregard the 150 mg dosage sent in previous   isosorbide mononitrate (IMDUR) 60 MG 24 hr tablet, TAKE 1 AND 1/2 TABLETS(90 MG) BY MOUTH EVERY EVENING   nitroGLYCERIN (NITROSTAT) 0.4 MG SL tablet, Place 1 tablet (0.4 mg total) under the tongue every 5 minutes as needed for chest pain.   rosuvastatin (CRESTOR) 40 MG tablet, TAKE 1 TABLET(40 MG) BY MOUTH DAILY   carvedilol (COREG) 12.5 MG tablet, Take 1 tablet (12.5 mg total) by mouth 2 (two) times daily.   Current Outpatient Medications (Analgesics):    Aspirin 81 MG EC tablet, Take 1 tablet (81 mg total) by mouth daily.    meloxicam (MOBIC) 15 MG tablet, TAKE 1 TABLET(15 MG) BY MOUTH DAILY. PATIENT TAKE AS NEEDED  Current Outpatient Medications (Hematological):    clopidogrel (PLAVIX) 75 MG tablet, TAKE 1 TABLET(75 MG) BY MOUTH DAILY WITH BREAKFAST  Current Outpatient Medications (Other):    AMBULATORY NON FORMULARY MEDICATION, Prosthetic Leg Liners   AMBULATORY NON FORMULARY MEDICATION, at bedtime. ResMed CPAP Machine   Cholecalciferol (VITAMIN D3) 50 MCG (2000 UT) capsule, Take 2,000 Units by mouth daily.   Nitroglycerin 0.4 % OINT, Place 1 Application rectally in the morning and at bedtime. Apply 1 inch (375 mg) ointment intra-anally every 12 hours for anal fissure   potassium citrate (UROCIT-K) 10 MEQ (1080 MG) SR tablet, TAKE 2 TABLET BY MOUTH DAILY   TURMERIC PO, Take 1,500-3,000 mg by mouth daily. 1500 mg each alternate every mouth    Objective  Blood pressure 124/62, pulse (!) 58, height 5\' 4"  (1.626 m), weight 198 lb (89.8 kg), SpO2 98%.   General: No apparent distress alert and oriented x3 mood and affect normal, dressed appropriately.  HEENT: Pupils equal, extraocular movements intact  Respiratory: Patient's speak in full sentences and does not appear short of breath  Cardiovascular: No lower extremity edema, non tender, no erythema  Back exam shows still doing relatively well.  Some mild tightness but able to get out of a seated chair without any significant difficulty. Still antalgic gai but significantly improved.  Does have the prosthesis on the left leg    Impression and Recommendations:     The above documentation has been reviewed and is accurate and complete Judi Saa, DO

## 2023-11-19 ENCOUNTER — Encounter: Payer: Self-pay | Admitting: Family Medicine

## 2023-11-19 ENCOUNTER — Ambulatory Visit: Payer: PPO | Admitting: Family Medicine

## 2023-11-19 VITALS — BP 124/62 | HR 58 | Ht 64.0 in | Wt 198.0 lb

## 2023-11-19 DIAGNOSIS — G5701 Lesion of sciatic nerve, right lower limb: Secondary | ICD-10-CM

## 2023-11-19 NOTE — Patient Instructions (Signed)
 DHEA 50mg  daily for 4 weeks then 2 weeks off Give me an update after 6 weeks See you again in 3-4 months just incase

## 2023-11-19 NOTE — Assessment & Plan Note (Signed)
 Responded very well to the leg length and the home exercises.  Now given more core strengthening exercises.  Having difficulty with weight loss and discussed some potential vitamin supplementations but to be careful for potential side effects.  Increase activity slowly.  Follow-up again in 6 to 8 weeks otherwise.

## 2023-12-08 ENCOUNTER — Other Ambulatory Visit: Payer: Self-pay | Admitting: Cardiology

## 2023-12-15 ENCOUNTER — Ambulatory Visit (INDEPENDENT_AMBULATORY_CARE_PROVIDER_SITE_OTHER): Payer: PPO

## 2023-12-15 VITALS — BP 102/52 | HR 52 | Temp 98.0°F | Ht 65.0 in | Wt 196.4 lb

## 2023-12-15 DIAGNOSIS — Z0000 Encounter for general adult medical examination without abnormal findings: Secondary | ICD-10-CM

## 2023-12-15 NOTE — Progress Notes (Signed)
 Subjective:   Devin Martin is a 78 y.o. who presents for a Medicare Wellness preventive visit.  Visit Complete: In person    Persons Participating in Visit: Patient.  AWV Questionnaire: No: Patient Medicare AWV questionnaire was not completed prior to this visit.  Cardiac Risk Factors include: advanced age (>22men, >58 women);dyslipidemia;hypertension;male gender     Objective:    Today's Vitals   12/15/23 0918  BP: (!) 102/52  Pulse: (!) 52  Temp: 98 F (36.7 C)  TempSrc: Oral  SpO2: 97%  Weight: 196 lb 6.4 oz (89.1 kg)  Height: 5\' 5"  (1.651 m)   Body mass index is 32.68 kg/m.     12/15/2023    9:34 AM 07/15/2023    7:29 AM 12/13/2022    8:53 AM 08/30/2022    7:50 AM 01/09/2022    8:56 AM 10/19/2020    9:43 AM 10/12/2019   12:07 PM  Advanced Directives  Does Patient Have a Medical Advance Directive? Yes Yes Yes Yes Yes Yes Yes  Type of Estate agent of [place];Living will Healthcare Power of [place];Living will Healthcare Power of [place];Living will Healthcare Power of [place];Living will Healthcare Power of [place];Living will Healthcare Power of [place];Living will Healthcare Power of [place];Living will  Does patient want to make changes to medical advance directive?    No - Patient declined   No - Patient declined  Copy of Healthcare Power of Attorney in Chart? Yes - validated most recent copy scanned in chart (See row information)  Yes - validated most recent copy scanned in chart (See row information)  No - copy requested No - copy requested No - copy requested    Current Medications (verified) Outpatient Encounter Medications as of 12/15/2023  Medication Sig   AMBULATORY NON FORMULARY MEDICATION Prosthetic Leg Liners   AMBULATORY NON FORMULARY MEDICATION at bedtime. ResMed CPAP Machine   Aspirin  81 MG EC tablet Take 1 tablet (81 mg total) by mouth daily.   chlorthalidone  (HYGROTON ) 25 MG tablet Take 0.5 tablets (12.5 mg total) by  mouth daily.   Cholecalciferol (VITAMIN D3) 50 MCG (2000 UT) capsule Take 2,000 Units by mouth daily.   clopidogrel  (PLAVIX ) 75 MG tablet TAKE 1 TABLET(75 MG) BY MOUTH DAILY WITH BREAKFAST   Cyanocobalamin  (VITAMIN B-12 PO) Take 1 tablet by mouth daily.   diltiazem  (TIAZAC ) 240 MG 24 hr capsule Take 240 mg by mouth daily.   irbesartan  (AVAPRO ) 150 MG tablet TAKE 1 TABLET(150 MG) BY MOUTH DAILY   isosorbide  mononitrate (IMDUR ) 60 MG 24 hr tablet TAKE 1 AND 1/2 TABLETS(90 MG) BY MOUTH EVERY EVENING   nitroGLYCERIN  (NITROSTAT ) 0.4 MG SL tablet Place 1 tablet (0.4 mg total) under the tongue every 5 minutes as needed for chest pain.   potassium citrate  (UROCIT-K ) 10 MEQ (1080 MG) SR tablet TAKE 2 TABLET BY MOUTH DAILY   rosuvastatin  (CRESTOR ) 40 MG tablet TAKE 1 TABLET(40 MG) BY MOUTH DAILY   TURMERIC PO Take 1,500-3,000 mg by mouth daily. 1500 mg each alternate every mouth   carvedilol  (COREG ) 12.5 MG tablet Take 1 tablet (12.5 mg total) by mouth 2 (two) times daily.   meloxicam  (MOBIC ) 15 MG tablet TAKE 1 TABLET(15 MG) BY MOUTH DAILY. PATIENT TAKE AS NEEDED (Patient not taking: Reported on 12/15/2023)   Nitroglycerin  0.4 % OINT Place 1 Application rectally in the morning and at bedtime. Apply 1 inch (375 mg) ointment intra-anally every 12 hours for anal fissure (Patient not taking: Reported on 12/15/2023)  No facility-administered encounter medications on file as of 12/15/2023.    Allergies (verified) Penicillins, Tylox [oxycodone-acetaminophen ], and Codeine   History: Past Medical History:  Diagnosis Date   Allergy    ANXIETY 10/13/2009   Qualifier: Diagnosis of  By: Audery Blazing, MD, Penney Bowling    Arthritis 1968   pt wears a lt bk prosthesis   Bronchitis, chronic (HCC)    CAD (coronary artery disease)    Non-ST segment elevation myocardial infarction in 2005 with drug-eluting stent to the left anterior descending. 08-2009 - non-ST segment elevation myocardial infarction, felt  secondary to vasospasm.   Calculus, kidney    hx   Complication of anesthesia    itching after preop med  ?name - 2013, no SOB   Glaucoma    Hyperlipidemia    Hypertension    Myocardial infarction [facility]) 2005   x2, 2005, 2010   OSA (obstructive sleep apnea)    AHI 68/hr in 2002-uses a cpap   Pneumonia    hx of    Sleep apnea    wears CPAP   Wears glasses    Past Surgical History:  Procedure Laterality Date   AMPUT TRAUM LEG BELOW KNEE UNILT W/O COMPL Left    CARDIAC CATHETERIZATION     CORONARY IMAGING/OCT N/A 08/30/2022   Procedure: INTRAVASCULAR IMAGING/OCT;  Surgeon: Kyra Phy, MD;  Location: [facility];  Service: Cardiovascular;  Laterality: N/A;   CORONARY PRESSURE/FFR STUDY N/A 08/30/2022   Procedure: INTRAVASCULAR PRESSURE WIRE/FFR STUDY;  Surgeon: Kyra Phy, MD;  Location: [facility];  Service: Cardiovascular;  Laterality: N/A;   CORONARY STENT INTERVENTION N/A 08/30/2022   Procedure: CORONARY STENT INTERVENTION;  Surgeon: Kyra Phy, MD;  Location: [facility];  Service: Cardiovascular;  Laterality: N/A;   CORONARY STENT INTERVENTION N/A 07/15/2023   Procedure: CORONARY STENT INTERVENTION;  Surgeon: Cody Das, MD;  Location: [facility];  Service: Cardiovascular;  Laterality: N/A;   CORONARY ULTRASOUND/IVUS N/A 07/15/2023   Procedure: Coronary Ultrasound/IVUS;  Surgeon: Cody Das, MD;  [facility]: [facility];  Service: Cardiovascular;  Laterality: N/A;   CYST REMOVAL TRUNK  09/04/2012   Procedure: CYST REMOVAL TRUNK;  Surgeon: Shela Derby, MD;  Location: [facility];  Service: General;  Laterality: N/A;  EXCISION OF MID BACK MASS   EYE SURGERY Left DR.SON   CATARACT SX1/2019   INCISION AND DRAINAGE ABSCESS Left 07/31/2013   Procedure: INCISIONAL/NON-INCISIONAL DEBRIDEMENT OF ABSCESS LEFT KNEE;  Surgeon: Bevin Bucks, MD;  Location: [facility];  Service: Orthopedics;  Laterality: Left;    KIDNEY STONE SURGERY     multiple episodes    LEFT HEART CATH AND CORONARY ANGIOGRAPHY N/A 08/30/2022   Procedure: LEFT HEART CATH AND CORONARY ANGIOGRAPHY;  Surgeon: Kyra Phy, MD;  [facility]: [facility];  Service: Cardiovascular;  Laterality: N/A;   LEFT HEART CATH AND CORONARY ANGIOGRAPHY N/A 07/15/2023   Procedure: LEFT HEART CATH AND CORONARY ANGIOGRAPHY;  Surgeon: Cody Das, MD;  [facility]: [facility];  Service: Cardiovascular;  Laterality: N/A;   Right Knee Arthroscopy  2010   [facility]   TONSILLECTOMY     Family History  Problem Relation Age of Onset   Stroke Mother    Cancer Mother        Ovary, Breast   Heart attack Father 29   Heart attack Paternal Grandfather    Colon cancer Neg Hx  Prostate cancer Neg Hx    Diabetes Neg Hx    Esophageal cancer Neg Hx    Rectal cancer Neg Hx    Stomach cancer Neg Hx    Social History   Socioeconomic History   Marital status: Married    Spouse name: Not on file   Number of children: 2   Years of education: Not on file   Highest education level: Not on file  Occupational History   Occupation: Art gallery manager-- RETIRED 2012  Tobacco Use   Smoking status: Former    Current packs/day: 0.00    Average packs/day: 0.8 packs/day for 20.0 years (15.0 ttl pk-yrs)    Types: Cigarettes, E-cigarettes    Start date: 1995    Quit date: 2015    Years since quitting: 10.3   Smokeless tobacco: Never   Tobacco comments:    currently doing some e-cigarettes  Vaping Use   Vaping status: Some Days   Substances: Nicotine  Substance and Sexual Activity   Alcohol use: Yes    Comment: bourbon 3 fingers 3 x/week.   Drug use: No   Sexual activity: Not Currently  Other Topics Concern   Not on file  Social History Narrative   Lives w/ wife    Social Drivers of Health   Financial Resource Strain: Low Risk  (12/15/2023)   Overall Financial Resource Strain (CARDIA)    Difficulty of Paying Living Expenses: Not  hard at all  Food Insecurity: No Food Insecurity (12/15/2023)   Hunger Vital Sign    Worried About Running Out of Food in the Last Year: Never true    Ran Out of Food in the Last Year: Never true  Transportation Needs: No Transportation Needs (12/15/2023)   PRAPARE - Administrator, Civil Service (Medical): No    Lack of Transportation (Non-Medical): No  Physical Activity: Sufficiently Active (12/15/2023)   Exercise Vital Sign    Days of Exercise per Week: 7 days    Minutes of Exercise per Session: 60 min  Stress: No Stress Concern Present (12/15/2023)   [employer] of Occupational Health - Occupational Stress Questionnaire    Feeling of Stress : Only a little  Social Connections: Moderately Integrated (12/15/2023)   Social Connection and Isolation Panel [NHANES]    Frequency of Communication with Friends and Family: More than three times a week    Frequency of Social Gatherings with Friends and Family: More than three times a week    Attends Religious Services: More than 4 times per year    Active Member of [employer] or Organizations: No    Attends Engineer, structural: Never    Marital Status: Married    Tobacco Counseling Counseling given: Not Answered Tobacco comments: currently doing some e-cigarettes    Clinical Intake:  Pre-visit preparation completed: Yes  Pain : No/denies pain     Nutritional Status: BMI > 30  Obese Nutritional Risks: None Diabetes: No  Lab Results  Component Value Date   HGBA1C 5.8 10/19/2021   HGBA1C 5.7 10/13/2020   HGBA1C 5.7 04/12/2020     How often do you need to have someone help you when you read instructions, pamphlets, or other written materials from your doctor or pharmacy?: 1 - Never  Interpreter Needed?: No  Information entered by :: NAllen LPN   Activities of Daily Living     12/15/2023    9:26 AM  In your present state of health, do you have any difficulty performing  the following activities:   Hearing? 0  Vision? 0  Difficulty concentrating or making decisions? 0  Walking or climbing stairs? 0  Dressing or bathing? 0  Doing errands, shopping? 0  Preparing Food and eating ? N  Using the Toilet? N  In the past six months, have you accidently leaked urine? N  Do you have problems with loss of bowel control? N  Managing your Medications? N  Managing your Finances? N  Housekeeping or managing your Housekeeping? N    Patient Care Team: Graig Lawyer, MD as PCP - General (Family Medicine) Audery Blazing Deannie Fabian, MD as PCP - Cardiology (Cardiology) Audery Blazing Deannie Fabian, MD as Consulting Physician (Cardiology) Leim Pupa, OD as Referring Physician (Optometry) Sherlie Distance as Referring Physician (Dentistry) Claiborne Crew, MD as Consulting Physician (Orthopedic Surgery) Boyce Byes, MD as Consulting Physician (General Surgery) Altamease Asters, DO as Consulting Physician (Ophthalmology) Devin Even, MD as Consulting Physician (Orthopedic Surgery)  Indicate any recent Medical Services you may have received from other than [facility] providers in the past year (date may be approximate).     Assessment:   This is a routine wellness examination for Devin Martin.  Hearing/Vision screen Hearing Screening - Comments:: Denies hearing issues Vision Screening - Comments:: Regular eye exams, [facility]   Goals Addressed             This Visit's Progress    Patient Stated       12/15/2023, wants to lose 10 pounds and increase strength and stamina       Depression Screen     12/15/2023    9:36 AM 12/13/2022    8:54 AM 10/14/2022    8:43 AM 04/12/2022    2:03 PM 01/09/2022    8:57 AM 01/09/2022    8:55 AM 03/20/2021    8:47 AM  PHQ 2/9 Scores  PHQ - 2 Score 0 0 0 0 0 0 0    Fall Risk     12/15/2023    9:35 AM 12/13/2022    8:54 AM 10/14/2022    8:43 AM 04/12/2022    2:03 PM 01/09/2022    8:57 AM  Fall Risk   Falls in the past year? 0 0 0 0 0  Number falls in past yr: 0 0 0 0 0   Injury with Fall? 0 0 0  0  Risk for fall due to : Medication side effect Medication side effect No Fall Risks    Follow up Falls prevention discussed;Falls evaluation completed Falls prevention discussed;Education provided;Falls evaluation completed Falls evaluation completed  Falls evaluation completed    MEDICARE RISK AT HOME:  Medicare Risk at Home Any stairs in or around the home?: Yes If so, are there any without handrails?: No Home free of loose throw rugs in walkways, pet beds, electrical cords, etc?: Yes Adequate lighting in your home to reduce risk of falls?: Yes Life alert?: No Use of a cane, walker or w/c?: No Grab bars in the bathroom?: Yes Shower chair or bench in shower?: No Elevated toilet seat or a handicapped toilet?: No  TIMED UP AND GO:  Was the test performed?  Yes  Length of time to ambulate 10 feet: 5 sec Gait slow and steady with assistive device  Cognitive Function: Normal: Normal cognitive status assessed by direct observation by this Clinical Health Advisor. No abnormalities found. Patient is able to answer questions in an accurate and timely manner.    09/26/2017    9:20 AM  09/22/2015    2:41 PM  MMSE - Mini Mental State Exam  Orientation to time 5 5  Orientation to Place 5 5  Registration 3 3  Attention/ Calculation 5 5  Recall 3 3  Language- name 2 objects 2 2  Language- repeat 1 1  Language- follow 3 step command 3 3  Language- read & follow direction 1 1  Write a sentence 1 1  Copy design 1 1  Total score 30 30        12/13/2022    8:56 AM  6CIT Screen  What Year? 0 points  What month? 0 points  What time? 0 points  Count back from 20 0 points  Months in reverse 4 points  Repeat phrase 0 points  Total Score 4 points    Immunizations Immunization History  Administered Date(s) Administered   Fluad Quad(high Dose 65+) 06/04/2019   HIB (PRP-OMP) 06/23/2007   Influenza Split 08/15/2011, 04/30/2022   Influenza, High Dose Seasonal  PF 08/07/2016, 09/26/2017, 06/09/2020   Influenza,inj,Quad PF,6+ Mos 06/06/2014, 09/22/2015   Influenza-Unspecified 05/30/2018, 06/27/2021, 06/24/2023   Meningococcal B, OMV 01/16/2017, 03/21/2017   Meningococcal Conjugate 02/06/2016   Meningococcal polysaccharide vaccine (MPSV4) 06/23/2007   Moderna Sars-Covid-2 Vaccination 09/07/2019, 10/06/2019, 06/28/2020   Pneumococcal Conjugate-13 09/22/2015   Pneumococcal Polysaccharide-23 01/29/2013   Rsv, Bivalent, Protein Subunit Rsvpref,pf (Abrysvo) 06/24/2023   Td 08/26/2006, 09/26/2017   Zoster, Live 02/06/2016    Screening Tests Health Maintenance  Topic Date Due   Zoster Vaccines- Shingrix  (1 of 2) 11/14/1997   INFLUENZA VACCINE  03/26/2024   Medicare Annual Wellness (AWV)  12/14/2024   DTaP/Tdap/Td (3 - Tdap) 09/27/2027   Pneumonia Vaccine 76+ Years old  Completed   Hepatitis C Screening  Completed   HPV VACCINES  Aged Out   Colonoscopy  Discontinued   COVID-19 Vaccine  Discontinued   Meningococcal B Vaccine  Discontinued    Health Maintenance  Health Maintenance Due  Topic Date Due   Zoster Vaccines- Shingrix  (1 of 2) 11/14/1997   Health Maintenance Items Addressed: Due for shingles vaccine.  Additional Screening:  Vision Screening: Recommended annual ophthalmology exams for early detection of glaucoma and other disorders of the eye.  Dental Screening: Recommended annual dental exams for proper oral hygiene  Community Resource Referral / Chronic Care Management: CRR required this visit?  No   CCM required this visit?  No     Plan:     I have personally reviewed and noted the following in the patient's chart:   Medical and social history Use of alcohol, tobacco or illicit drugs  Current medications and supplements including opioid prescriptions. Patient is not currently taking opioid prescriptions. Functional ability and status Nutritional status Physical activity Advanced directives List of other  physicians Hospitalizations, surgeries, and ER visits in previous 12 months Vitals Screenings to include cognitive, depression, and falls Referrals and appointments  In addition, I have reviewed and discussed with patient certain preventive protocols, quality metrics, and best practice recommendations. A written personalized care plan for preventive services as well as general preventive health recommendations were provided to patient.     Areatha Beecham, LPN   12/02/8117   After Visit Summary: (In Person-Printed) AVS printed and given to the patient  Notes: Please refer to Routing Comments.

## 2023-12-15 NOTE — Patient Instructions (Signed)
 Mr. Fife , Thank you for taking time to come for your Medicare Wellness Visit. I appreciate your ongoing commitment to your health goals. Please review the following plan we discussed and let me know if I can assist you in the future.   Referrals/Orders/Follow-Ups/Clinician Recommendations: making appointment to see Dr. Therese Flash  This is a list of the screening recommended for you and due dates:  Health Maintenance  Topic Date Due   Zoster (Shingles) Vaccine (1 of 2) 11/14/1997   Flu Shot  03/26/2024   Medicare Annual Wellness Visit  12/14/2024   DTaP/Tdap/Td vaccine (3 - Tdap) 09/27/2027   Pneumonia Vaccine  Completed   Hepatitis C Screening  Completed   HPV Vaccine  Aged Out   Colon Cancer Screening  Discontinued   COVID-19 Vaccine  Discontinued   Meningitis B Vaccine  Discontinued    Advanced directives: (In Chart) A copy of your advanced directives are scanned into your chart should your provider ever need it.  Next Medicare Annual Wellness Visit scheduled for next year: Yes  insert Preventive Care attachment Insert FALL PREVENTION attachment if needed

## 2023-12-16 ENCOUNTER — Encounter: Payer: Self-pay | Admitting: Family Medicine

## 2023-12-17 ENCOUNTER — Ambulatory Visit (INDEPENDENT_AMBULATORY_CARE_PROVIDER_SITE_OTHER)

## 2023-12-17 ENCOUNTER — Ambulatory Visit (INDEPENDENT_AMBULATORY_CARE_PROVIDER_SITE_OTHER): Admitting: Family Medicine

## 2023-12-17 ENCOUNTER — Encounter: Payer: Self-pay | Admitting: Family Medicine

## 2023-12-17 ENCOUNTER — Other Ambulatory Visit: Payer: Self-pay

## 2023-12-17 VITALS — BP 122/70 | HR 66 | Temp 98.3°F | Ht 65.0 in | Wt 194.2 lb

## 2023-12-17 DIAGNOSIS — M545 Low back pain, unspecified: Secondary | ICD-10-CM | POA: Diagnosis not present

## 2023-12-17 DIAGNOSIS — I10 Essential (primary) hypertension: Secondary | ICD-10-CM | POA: Diagnosis not present

## 2023-12-17 DIAGNOSIS — I25119 Atherosclerotic heart disease of native coronary artery with unspecified angina pectoris: Secondary | ICD-10-CM | POA: Diagnosis not present

## 2023-12-17 DIAGNOSIS — G8929 Other chronic pain: Secondary | ICD-10-CM | POA: Diagnosis not present

## 2023-12-17 DIAGNOSIS — I952 Hypotension due to drugs: Secondary | ICD-10-CM | POA: Diagnosis not present

## 2023-12-17 DIAGNOSIS — M419 Scoliosis, unspecified: Secondary | ICD-10-CM | POA: Diagnosis not present

## 2023-12-17 DIAGNOSIS — M47817 Spondylosis without myelopathy or radiculopathy, lumbosacral region: Secondary | ICD-10-CM | POA: Diagnosis not present

## 2023-12-17 NOTE — Progress Notes (Signed)
 [facility] [address] [place] [address] Dept: [phone] Dept Fax: [phone]  Office Visit  Subjective:    Patient ID: Devin Martin, male    DOB: 09-Mar-1946, 78 y.o..   MRN: [id number]  Chief Complaint  Patient presents with   Follow-up    F/u had some low BP and low energy.     History of Present Illness:  Patient is in today with a concern over recent low blood pressures. He notes that he was seen by a nurse for his Medicare AWV recently. His systolic blood pressure was 102. He described having had pressures as low as 70/40 at home. At these times, he feels very weak and sluggish. He notes this episodes occur most frequently in the morning and do interfere with his exercise routine.  Devin Martin has a history of coronary artery disease with a two prior MIs, and several cardiac stents. His last cardiac cath was in Nov. 2024. It showed the stent in his proximal LAD had moderate proximal in-stent restenosis (40-50%) and 95% calcific stenosis in the distal stent and native artery. He had a new stent placed. He is managed on DAPT with aspirin  81 mg and clopidogrel  75 mg daily. He is also on isosorbide  60 mg 1 1/2 tabs daily, and rosuvastatin  40 mg daily. Devin Martin has a history of hypertension, managed on diltiazem  240 mg daily, irbesartan  75 mg daily, and chlorthalidone  12.5 mg daily.   Past Medical History: Patient Active Problem List   Diagnosis Date Noted   Piriformis syndrome of right side 10/09/2023   Kidney cysts 08/25/2023   Right bundle branch block 06/25/2023   COVID-19 04/09/2023   Allergy to adhesive tape 01/24/2023   Angina pectoris (HCC) 08/30/2022   Obesity (BMI 30.0-34.9) 10/19/2021   Prediabetes 10/19/2021   Remote history of stroke 06/03/2019   Cholecystitis 01/16/2017   S/P BKA (below knee amputation) (HCC) 05/06/2016   Osteoarthritis of right knee 12/31/2013   Hypogonadism male 08/12/2011    GERD 04/05/2010   Essential hypertension 01/26/2009   Hyperlipidemia 06/23/2007   Obstructive sleep apnea 06/23/2007   Coronary artery disease involving native coronary artery of native heart with angina pectoris (HCC) 06/23/2007   History of kidney stones 06/17/2007   Past Surgical History:  Procedure Laterality Date   AMPUT TRAUM LEG BELOW KNEE UNILT W/O COMPL Left    CARDIAC CATHETERIZATION     CORONARY IMAGING/OCT N/A 08/30/2022   Procedure: INTRAVASCULAR IMAGING/OCT;  Surgeon: Kyra Phy, MD;  [facility];  Service: Cardiovascular;  Laterality: N/A;   CORONARY PRESSURE/FFR STUDY N/A 08/30/2022   Procedure: INTRAVASCULAR PRESSURE WIRE/FFR STUDY;  Surgeon: Kyra Phy, MD;  [facility]: [facility];  Service: Cardiovascular;  Laterality: N/A;   CORONARY STENT INTERVENTION N/A 08/30/2022   Procedure: CORONARY STENT INTERVENTION;  Surgeon: Kyra Phy, MD;  [facility]: [facility];  Service: Cardiovascular;  Laterality: N/A;   CORONARY STENT INTERVENTION N/A 07/15/2023   Procedure: CORONARY STENT INTERVENTION;  Surgeon: Cody Das, MD;  [facility];  Service: Cardiovascular;  Laterality: N/A;   CORONARY ULTRASOUND/IVUS N/A 07/15/2023   Procedure: Coronary Ultrasound/IVUS;  Surgeon: Cody Das, MD;  [facility];  Service: Cardiovascular;  Laterality: N/A;   CYST REMOVAL TRUNK  09/04/2012   Procedure: CYST REMOVAL TRUNK;  Surgeon: Shela Derby, MD;  Location: [facility];  Service: General;  Laterality: N/A;  EXCISION OF MID BACK MASS   EYE SURGERY Left DR.SON   CATARACT SX1/2019   INCISION AND DRAINAGE ABSCESS Left 07/31/2013   Procedure: INCISIONAL/NON-INCISIONAL DEBRIDEMENT OF ABSCESS LEFT KNEE;  Surgeon: Bevin Bucks, MD;  Location: [facility];  Service: Orthopedics;  Laterality: Left;   KIDNEY STONE SURGERY     multiple episodes    LEFT HEART CATH AND CORONARY ANGIOGRAPHY N/A  08/30/2022   Procedure: LEFT HEART CATH AND CORONARY ANGIOGRAPHY;  Surgeon: Kyra Phy, MD;  [facility];  Service: Cardiovascular;  Laterality: N/A;   LEFT HEART CATH AND CORONARY ANGIOGRAPHY N/A 07/15/2023   Procedure: LEFT HEART CATH AND CORONARY ANGIOGRAPHY;  Surgeon: Cody Das, MD;  [facility];  Service: Cardiovascular;  Laterality: N/A;   Right Knee Arthroscopy  2010   [facility]   TONSILLECTOMY     Family History  Problem Relation Age of Onset   Stroke Mother    Cancer Mother        Ovary, Breast   Heart attack Father 9   Heart attack Paternal Grandfather    Colon cancer Neg Hx    Prostate cancer Neg Hx    Diabetes Neg Hx    Esophageal cancer Neg Hx    Rectal cancer Neg Hx    Stomach cancer Neg Hx    Outpatient Medications Prior to Visit  Medication Sig Dispense Refill   AMBULATORY NON FORMULARY MEDICATION Prosthetic Leg Liners 2 application 0   AMBULATORY NON FORMULARY MEDICATION at bedtime. ResMed CPAP Machine     Aspirin  81 MG EC tablet Take 1 tablet (81 mg total) by mouth daily.     carvedilol  (COREG ) 12.5 MG tablet Take 1 tablet (12.5 mg total) by mouth 2 (two) times daily. 180 tablet 3   chlorthalidone  (HYGROTON ) 25 MG tablet Take 0.5 tablets (12.5 mg total) by mouth daily. 45 tablet 3   Cholecalciferol (VITAMIN D3) 50 MCG (2000 UT) capsule Take 2,000 Units by mouth daily.     clopidogrel  (PLAVIX ) 75 MG tablet TAKE 1 TABLET(75 MG) BY MOUTH DAILY WITH BREAKFAST 90 tablet 3   Cyanocobalamin  (VITAMIN B-12 PO) Take 1 tablet by mouth daily.     diltiazem  (TIAZAC ) 240 MG 24 hr capsule Take 240 mg by mouth daily.     irbesartan  (AVAPRO ) 150 MG tablet TAKE 1 TABLET(150 MG) BY MOUTH DAILY 90 tablet 0   isosorbide  mononitrate (IMDUR ) 60 MG 24 hr tablet TAKE 1 AND 1/2 TABLETS(90 MG) BY MOUTH EVERY EVENING 135 tablet 3   meloxicam  (MOBIC ) 15 MG tablet TAKE 1 TABLET(15 MG) BY MOUTH DAILY. PATIENT TAKE AS NEEDED 30 tablet 5    nitroGLYCERIN  (NITROSTAT ) 0.4 MG SL tablet Place 1 tablet (0.4 mg total) under the tongue every 5 minutes as needed for chest pain. 25 tablet 2   Nitroglycerin  0.4 % OINT Place 1 Application rectally in the morning and at bedtime. Apply 1 inch (375 mg) ointment intra-anally every 12 hours for anal fissure 30 g 1   potassium citrate  (UROCIT-K ) 10 MEQ (1080 MG) SR tablet TAKE 2 TABLET BY MOUTH DAILY 180 tablet 3   rosuvastatin  (CRESTOR ) 40 MG tablet TAKE 1 TABLET(40 MG) BY MOUTH DAILY 90 tablet 3   TURMERIC PO Take 1,500-3,000 mg by mouth daily. 1500 mg each alternate every mouth     No facility-administered medications prior to visit.   Allergies  Allergen Reactions   Penicillins Hives and Other (See Comments)    syncope  Tylox [Oxycodone-Acetaminophen ] Other (See Comments)    "messes me up"   Codeine Nausea Only     Objective:   Today's Vitals   12/17/23 1444  BP: 122/70  Pulse: 66  Temp: 98.3 F (36.8 C)  TempSrc: Temporal  SpO2: 99%  Weight: 194 lb 3.2 oz (88.1 kg)  Height: 5\' 5"  (1.651 m)   Body mass index is 32.32 kg/m.   General: Well developed, well nourished. No acute distress. Psych: Alert and oriented. Normal mood and affect.  Health Maintenance Due  Topic Date Due   Zoster Vaccines- Shingrix  (1 of 2) 11/14/1997     Assessment & Plan:   1. Hypotension due to drugs (Primary) 2. Coronary artery disease involving native coronary artery of native heart with angina pectoris (HCC) 3. Essential hypertension  Devin Martin appears to be having trouble with episodes of hypotension. He is on multiple medications that may be contributing to this. I recommend he continue  isosorbide  60 mg 1 1/2 tabs daily, diltiazem  240 mg daily, and irbesartan  75 mg daily. As an initial step, I will have him stop his chlorthalidone . He will follow his BPs at home.   Return in about 2 months (around 02/16/2024) for Reassessment.   Graig Lawyer, MD

## 2023-12-17 NOTE — Patient Instructions (Signed)
Discontinue chlorthalidone.    

## 2023-12-18 ENCOUNTER — Encounter: Payer: Self-pay | Admitting: Family Medicine

## 2023-12-22 ENCOUNTER — Ambulatory Visit
Admission: RE | Admit: 2023-12-22 | Discharge: 2023-12-22 | Disposition: A | Discharge status: Home / Self Care | Source: Ambulatory Visit | Attending: Family Medicine | Admitting: Family Medicine

## 2023-12-22 DIAGNOSIS — M5126 Other intervertebral disc displacement, lumbar region: Secondary | ICD-10-CM | POA: Diagnosis not present

## 2023-12-22 DIAGNOSIS — M545 Low back pain, unspecified: Secondary | ICD-10-CM

## 2023-12-22 DIAGNOSIS — M48061 Spinal stenosis, lumbar region without neurogenic claudication: Secondary | ICD-10-CM | POA: Diagnosis not present

## 2023-12-22 DIAGNOSIS — M4726 Other spondylosis with radiculopathy, lumbar region: Secondary | ICD-10-CM | POA: Diagnosis not present

## 2023-12-23 ENCOUNTER — Encounter: Payer: Self-pay | Admitting: Cardiology

## 2023-12-26 ENCOUNTER — Encounter: Payer: Self-pay | Admitting: Cardiology

## 2023-12-26 ENCOUNTER — Encounter: Payer: Self-pay | Admitting: Family Medicine

## 2023-12-26 MED ORDER — DILTIAZEM HCL ER COATED BEADS 180 MG PO CP24: 180.0000 mg | ORAL_CAPSULE | Freq: Every day | ORAL | 3 refills | Status: DC

## 2023-12-26 MED ORDER — CARVEDILOL 6.25 MG PO TABS: 6.2500 mg | ORAL_TABLET | Freq: Two times a day (BID) | ORAL | Status: DC

## 2023-12-26 NOTE — Telephone Encounter (Signed)
 Patient identification verified by 2 forms. Devin Lucky, RN    Called and spoke to patient  Patient states he spoke to RN [name] this morning  Patient has no further questions or concerns at this time      Please see documentation in 4/29 MyChart message "Low BP.Aaron AasAaron Aas"

## 2023-12-26 NOTE — Addendum Note (Signed)
 Addended by: Render Carrie on: 12/26/2023 10:27 AM   Modules accepted: Orders

## 2023-12-26 NOTE — Telephone Encounter (Signed)
 Spoke with pt,

## 2023-12-26 NOTE — Progress Notes (Unsigned)
 HPI: FU coronary artery disease. The patient underwent cardiac catheterization in December 2005 secondary to a non-ST elevation myocardial infarction. At that time, his ejection fraction was 60%. He had successful PCI of his mid LAD with a drug-eluting stent. Note, there was jailing of 2 small diagonals by the LAD stent.  Abdominal ultrasound 2/17 showed no aneurysm. Patient had repeat catheterization January 2024 due to exertional chest pain.  The stent in his proximal LAD had moderate in-stent restenosis (50) with normal FFR, 90% proximal circumflex lesion treated with drug-eluting stent and LVEDP of 20 mmHg.  Repeat catheterization November 2024 showed 95% in the distal edge of the prior LAD stent, patent stent in the circumflex and minimal irregularities in the right coronary artery; patient had PCI of the LAD.  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  Recently contacted the office with low blood pressure.  Patient's carvedilol  was reduced as well as his Cardizem .  Since I last saw him, he states his blood pressure has now increased off of his previous medications.  He notes dyspnea with more vigorous activities but not with routine activities in the house.  No orthopnea, PND or pedal edema.  He denies chest pain.  Current Outpatient Medications  Medication Sig Dispense Refill   AMBULATORY NON FORMULARY MEDICATION Prosthetic Leg Liners 2 application 0   AMBULATORY NON FORMULARY MEDICATION at bedtime. ResMed CPAP Machine     Aspirin  81 MG EC tablet Take 1 tablet (81 mg total) by mouth daily.     carvedilol  (COREG ) 6.25 MG tablet Take 1 tablet (6.25 mg total) by mouth 2 (two) times daily.     Cholecalciferol (VITAMIN D3) 50 MCG (2000 UT) capsule Take 2,000 Units by mouth daily.     clopidogrel  (PLAVIX ) 75 MG tablet TAKE 1 TABLET(75 MG) BY MOUTH DAILY WITH BREAKFAST 90 tablet 3    Cyanocobalamin  (VITAMIN B-12 PO) Take 1 tablet by mouth daily.     diltiazem  (CARDIZEM  CD) 180 MG 24 hr capsule Take 1 capsule (180 mg total) by mouth daily. 90 capsule 3   irbesartan  (AVAPRO ) 150 MG tablet TAKE 1 TABLET(150 MG) BY MOUTH DAILY 90 tablet 0   isosorbide  mononitrate (IMDUR ) 60 MG 24 hr tablet TAKE 1 AND 1/2 TABLETS(90 MG) BY MOUTH EVERY EVENING 135 tablet 3   meloxicam  (MOBIC ) 15 MG tablet TAKE 1 TABLET(15 MG) BY MOUTH DAILY. PATIENT TAKE AS NEEDED 30 tablet 5   nitroGLYCERIN  (NITROSTAT ) 0.4 MG SL tablet Place 1 tablet (0.4 mg total) under the tongue every 5 minutes as needed for chest pain. 25 tablet 2   Nitroglycerin  0.4 % OINT Place 1 Application rectally in the morning and at bedtime. Apply 1 inch (375 mg) ointment intra-anally every 12 hours for anal fissure 30 g 1   potassium citrate  (UROCIT-K ) 10 MEQ (1080 MG) SR tablet TAKE 2 TABLET BY MOUTH DAILY 180 tablet 3   rosuvastatin  (CRESTOR ) 40 MG tablet TAKE 1 TABLET(40 MG) BY MOUTH DAILY 90 tablet 3   TURMERIC PO Take 1,500-3,000 mg by mouth daily. 1500 mg each alternate every mouth     No current facility-administered medications for this visit.     Past Medical History:  Diagnosis Date   Allergy    ANXIETY 10/13/2009   Qualifier: Diagnosis of  By: Audery Blazing, MD, [facility] Bowling    Arthritis 1968   pt wears a lt bk prosthesis   Bronchitis, chronic (  HCC)    CAD (coronary artery disease)    Non-ST segment elevation myocardial infarction in 2005 with drug-eluting stent to the left anterior descending. 08-2009 - non-ST segment elevation myocardial infarction, felt secondary to vasospasm.   Calculus, kidney    hx   Complication of anesthesia    itching after preop med  ?name - 2013, no SOB   Glaucoma    Hyperlipidemia    Hypertension    Myocardial infarction [facility]) 2005   x2, 2005, 2010   OSA (obstructive sleep apnea)    AHI 68/hr in 2002-uses a cpap   Pneumonia    hx of    Sleep apnea    wears CPAP   Wears  glasses     Past Surgical History:  Procedure Laterality Date   AMPUT TRAUM LEG BELOW KNEE UNILT W/O COMPL Left    CARDIAC CATHETERIZATION     CORONARY IMAGING/OCT N/A 08/30/2022   Procedure: INTRAVASCULAR IMAGING/OCT;  Surgeon: Kyra Phy, MD;  [facility];  Service: Cardiovascular;  Laterality: N/A;   CORONARY PRESSURE/FFR STUDY N/A 08/30/2022   Procedure: INTRAVASCULAR PRESSURE WIRE/FFR STUDY;  Surgeon: Kyra Phy, MD;  [facility];  Service: Cardiovascular;  Laterality: N/A;   CORONARY STENT INTERVENTION N/A 08/30/2022   Procedure: CORONARY STENT INTERVENTION;  Surgeon: Kyra Phy, MD;  [facility];  Service: Cardiovascular;  Laterality: N/A;   CORONARY STENT INTERVENTION N/A 07/15/2023   Procedure: CORONARY STENT INTERVENTION;  Surgeon: Cody Das, MD;  [facility];  Service: Cardiovascular;  Laterality: N/A;   CORONARY ULTRASOUND/IVUS N/A 07/15/2023   Procedure: Coronary Ultrasound/IVUS;  Surgeon: Cody Das, MD;  [facility];  Service: Cardiovascular;  Laterality: N/A;   CYST REMOVAL TRUNK  09/04/2012   Procedure: CYST REMOVAL TRUNK;  Surgeon: Shela Derby, MD;  Location: [facility];  Service: General;  Laterality: N/A;  EXCISION OF MID BACK MASS   EYE SURGERY Left DR.SON   CATARACT SX1/2019   INCISION AND DRAINAGE ABSCESS Left 07/31/2013   Procedure: INCISIONAL/NON-INCISIONAL DEBRIDEMENT OF ABSCESS LEFT KNEE;  Surgeon: Bevin Bucks, MD;  Location: [facility];  Service: Orthopedics;  Laterality: Left;   KIDNEY STONE SURGERY     multiple episodes    LEFT HEART CATH AND CORONARY ANGIOGRAPHY N/A 08/30/2022   Procedure: LEFT HEART CATH AND CORONARY ANGIOGRAPHY;  Surgeon: Kyra Phy, MD;  [facility];  Service: Cardiovascular;  Laterality: N/A;   LEFT HEART CATH AND CORONARY ANGIOGRAPHY N/A 07/15/2023   Procedure: LEFT HEART CATH AND  CORONARY ANGIOGRAPHY;  Surgeon: Cody Das, MD;  [facility];  Service: Cardiovascular;  Laterality: N/A;   Right Knee Arthroscopy  2010   [facility]   TONSILLECTOMY      Social History   Socioeconomic History   Marital status: Married    Spouse name: Not on file   Number of children: 2   Years of education: Not on file   Highest education level: Not on file  Occupational History   Occupation: Art gallery manager-- RETIRED 2012  Tobacco Use   Smoking status: Former    Current packs/day: 0.00    Average packs/day: 0.8 packs/day for 20.0 years (15.0 ttl pk-yrs)    Types: Cigarettes, E-cigarettes    Start date: 1995    Quit date: 2015    Years since quitting: 10.3   Smokeless tobacco: Never  Tobacco comments:    currently doing some e-cigarettes  Vaping Use   Vaping status: Some Days   Substances: Nicotine  Substance and Sexual Activity   Alcohol use: Yes    Comment: bourbon 3 fingers 3 x/week.   Drug use: No   Sexual activity: Not Currently  Other Topics Concern   Not on file  Social History Narrative   Lives w/ wife    Social Drivers of Health   Financial Resource Strain: Low Risk  (12/15/2023)   Overall Financial Resource Strain (CARDIA)    Difficulty of Paying Living Expenses: Not hard at all  Food Insecurity: No Food Insecurity (12/15/2023)   Hunger Vital Sign    Worried About Running Out of Food in the Last Year: Never true    Ran Out of Food in the Last Year: Never true  Transportation Needs: No Transportation Needs (12/15/2023)   PRAPARE - Administrator, Civil Service (Medical): No    Lack of Transportation (Non-Medical): No  Physical Activity: Sufficiently Active (12/15/2023)   Exercise Vital Sign    Days of Exercise per Week: 7 days    Minutes of Exercise per Session: 60 min  Stress: No Stress Concern Present (12/15/2023)   [employer] of Occupational Health - Occupational Stress Questionnaire    Feeling of  Stress : Only a little  Social Connections: Moderately Integrated (12/15/2023)   Social Connection and Isolation Panel [NHANES]    Frequency of Communication with Friends and Family: More than three times a week    Frequency of Social Gatherings with Friends and Family: More than three times a week    Attends Religious Services: More than 4 times per year    Active Member of [employer] or Organizations: No    Attends Banker Meetings: Never    Marital Status: Married  Catering manager Violence: Not At Risk (12/15/2023)   Humiliation, Afraid, Rape, and Kick questionnaire    Fear of Current or Ex-Partner: No    Emotionally Abused: No    Physically Abused: No    Sexually Abused: No    Family History  Problem Relation Age of Onset   Stroke Mother    Cancer Mother        Ovary, Breast   Heart attack Father 100   Heart attack Paternal Grandfather    Colon cancer Neg Hx    Prostate cancer Neg Hx    Diabetes Neg Hx    Esophageal cancer Neg Hx    Rectal cancer Neg Hx    Stomach cancer Neg Hx     ROS: no fevers or chills, productive cough, hemoptysis, dysphasia, odynophagia, melena, hematochezia, dysuria, hematuria, rash, seizure activity, orthopnea, PND, pedal edema, claudication. Remaining systems are negative.  Physical Exam: Well-developed well-nourished in no acute distress.  Skin is warm and dry.  HEENT is normal.  Neck is supple.  Chest is clear to auscultation with normal expansion.  Cardiovascular exam is regular rate and rhythm.  Abdominal exam nontender or distended. No masses palpated. Extremities show no edema. neuro grossly intact   A/P  1 coronary artery disease-continue aspirin , Plavix  and statin.  2 hypertension-patient's blood pressure is now elevated as he discontinued his Cardizem  3 days ago.  I will continue present dose of ARB.  Increase carvedilol  to 12.5 mg twice daily.  Follow blood pressure and adjust medications as needed.  If it is not  controlled we will advance Avapro .  3 hyperlipidemia-continue statin.  4 ischemic cardiomyopathy-continue  ARB and beta-blocker.  He will remain off of Cardizem .  5 dyspnea-etiology unclear.  He is not having chest pain but is concerned about potential restenosis of recent intervention.  Will arrange Lexiscan  nuclear study to screen for ischemia.  He is not volume overloaded on examination.  Will also check BNP.  No risk factors for PE.  Alexandria Angel, MD

## 2023-12-26 NOTE — Telephone Encounter (Signed)
 Please see documentation in 5/2 MyChart message "Low BP"

## 2023-12-27 ENCOUNTER — Other Ambulatory Visit

## 2023-12-30 ENCOUNTER — Ambulatory Visit: Attending: Cardiology | Admitting: Cardiology

## 2023-12-30 ENCOUNTER — Encounter: Payer: Self-pay | Admitting: Cardiology

## 2023-12-30 VITALS — BP 152/76 | HR 60 | Ht 64.0 in | Wt 196.0 lb

## 2023-12-30 DIAGNOSIS — R0602 Shortness of breath: Secondary | ICD-10-CM

## 2023-12-30 MED ORDER — CARVEDILOL 12.5 MG PO TABS: 12.5000 mg | ORAL_TABLET | Freq: Two times a day (BID) | ORAL | 3 refills | Status: DC

## 2023-12-30 NOTE — Patient Instructions (Signed)
 Medication Instructions:   INCREASE CARVEDILOL  TO 12.5 MG TWICE DAILY= 2 OF THE 6.25 MG TABLETS TWICE DAILY  *If you need a refill on your cardiac medications before your next appointment, please call your pharmacy*  Testing/Procedures:   Your physician has requested that you have a lexiscan  myoview . For further information please visit [email or web address] Please follow instruction sheet, as given. [address]  Follow-Up: At [facility], you and your health needs are our priority.  As part of our continuing mission to provide you with exceptional heart care, our providers are all part of one team.  This team includes your primary Cardiologist (physician) and Advanced Practice Providers or APPs (Physician Assistants and Nurse Practitioners) who all work together to provide you with the care you need, when you need it.  Your next appointment:   3 month(s)  Provider:   Alexandria Angel, MD

## 2023-12-31 ENCOUNTER — Encounter: Payer: Self-pay | Admitting: *Deleted

## 2023-12-31 ENCOUNTER — Encounter: Payer: Self-pay | Admitting: Cardiology

## 2023-12-31 LAB — CBC
Hematocrit: 40.3 % (ref 37.5–51.0)
Hemoglobin: 13.6 g/dL (ref 13.0–17.7)
MCH: 31.3 pg (ref 26.6–33.0)
MCHC: 33.7 g/dL (ref 31.5–35.7)
MCV: 93 fL (ref 79–97)
Platelets: 260 10*3/uL (ref 150–450)
RBC: 4.35 x10E6/uL (ref 4.14–5.80)
RDW: 12.5 % (ref 11.6–15.4)
WBC: 5.3 10*3/uL (ref 3.4–10.8)

## 2023-12-31 LAB — PRO B NATRIURETIC PEPTIDE: NT-Pro BNP: 271 pg/mL (ref 0–486)

## 2023-12-31 NOTE — Telephone Encounter (Signed)
 Pt requested update be sent to you both

## 2024-01-01 ENCOUNTER — Encounter: Payer: Self-pay | Admitting: Family Medicine

## 2024-01-03 ENCOUNTER — Ambulatory Visit
Admission: EM | Admit: 2024-01-03 | Discharge: 2024-01-03 | Disposition: A | Discharge status: Home / Self Care | Attending: Internal Medicine | Admitting: Internal Medicine

## 2024-01-03 ENCOUNTER — Encounter: Payer: Self-pay | Admitting: Emergency Medicine

## 2024-01-03 DIAGNOSIS — L97921 Non-pressure chronic ulcer of unspecified part of left lower leg limited to breakdown of skin: Secondary | ICD-10-CM

## 2024-01-03 DIAGNOSIS — T879 Unspecified complications of amputation stump: Secondary | ICD-10-CM

## 2024-01-03 DIAGNOSIS — T8789 Other complications of amputation stump: Secondary | ICD-10-CM

## 2024-01-03 MED ORDER — MUPIROCIN 2 % EX OINT
1.0000 | TOPICAL_OINTMENT | Freq: Two times a day (BID) | CUTANEOUS | 0 refills | Status: AC
Start: 2024-01-03 — End: ?

## 2024-01-03 MED ORDER — BACITRACIN 500 UNIT/GM EX OINT
1.0000 | TOPICAL_OINTMENT | Freq: Once | CUTANEOUS | Status: AC
  Administered 2024-01-03: 1 via TOPICAL

## 2024-01-03 NOTE — ED Triage Notes (Signed)
 Pt has artifical left  leg. States he has lost some weight and has developed a blister on stump. He noticed it on Wednesday.

## 2024-01-03 NOTE — Discharge Instructions (Addendum)
 Apply mupirocin ointment to the wound every 12 hours for the next 7 days.  Cover with nonstick dressing while wearing leg. Allow wound to be open to air at bedtime. Watch for new/worsening signs of infection such as redness, swelling, pain, pus, fever, chills.  If you develop any new or worsening symptoms or if your symptoms do not start to improve, please return here or follow-up with your primary care provider. If your symptoms are severe, please go to the emergency room.

## 2024-01-03 NOTE — ED Provider Notes (Signed)
Devin Martin    CSN: [id number] Arrival date & time: 01/03/24  0845      History   Chief Complaint Chief Complaint  Patient presents with   Blister    HPI Devin Martin is a 78 y.o. male.   Devin Martin is a 78 y.o. male presenting for chief complaint of blister/sore to the stump of the left BKA that he first noticed approximately 3 days ago. He has had the left BKA for many years and wears a prosthetic leg so that he can ambulate. He plays golf and is very active, states the leg sweats and he has lost some weight recently causing blister to the stump. Blister is non-tender and is a red open wound with exposed top layer of skin. Denies drainage from the wound, pain, extending redness, swelling, and recent known trauma/injuries to the site. No recent fevers, chills, N/V, body aches, or numbness/tingling to the leg. He is applying polysporin to the site with minimal relief.      Past Medical History:  Diagnosis Date   Allergy    ANXIETY 10/13/2009   Qualifier: Diagnosis of  By: Audery Blazing, MD, Penney Bowling    Arthritis 1968   pt wears a lt bk prosthesis   Bronchitis, chronic (HCC)    CAD (coronary artery disease)    Non-ST segment elevation myocardial infarction in 2005 with drug-eluting stent to the left anterior descending. 08-2009 - non-ST segment elevation myocardial infarction, felt secondary to vasospasm.   Calculus, kidney    hx   Complication of anesthesia    itching after preop med  ?name - 2013, no SOB   Glaucoma    Hyperlipidemia    Hypertension    Myocardial infarction (HCC) 2005   x2, 2005, 2010   OSA (obstructive sleep apnea)    AHI 68/hr in 2002-uses a cpap   Pneumonia    hx of    Sleep apnea    wears CPAP   Wears glasses     Patient Active Problem List   Diagnosis Date Noted   Piriformis syndrome of right side 10/09/2023   Kidney cysts 08/25/2023   Right bundle branch block 06/25/2023   COVID-19 04/09/2023   Allergy to  adhesive tape 01/24/2023   Angina pectoris (HCC) 08/30/2022   Obesity (BMI 30.0-34.9) 10/19/2021   Prediabetes 10/19/2021   Remote history of stroke 06/03/2019   Cholecystitis 01/16/2017   S/P BKA (below knee amputation) (HCC) 05/06/2016   Osteoarthritis of right knee 12/31/2013   Hypogonadism male 08/12/2011   GERD 04/05/2010   Essential hypertension 01/26/2009   Hyperlipidemia 06/23/2007   Obstructive sleep apnea 06/23/2007   Coronary artery disease involving native coronary artery of native heart with angina pectoris (HCC) 06/23/2007   History of kidney stones 06/17/2007    Past Surgical History:  Procedure Laterality Date   AMPUT TRAUM LEG BELOW KNEE UNILT W/O COMPL Left    CARDIAC CATHETERIZATION     CORONARY IMAGING/OCT N/A 08/30/2022   Procedure: INTRAVASCULAR IMAGING/OCT;  Surgeon: Kyra Phy, MD;  [facility]: [facility];  Service: Cardiovascular;  Laterality: N/A;   CORONARY PRESSURE/FFR STUDY N/A 08/30/2022   Procedure: INTRAVASCULAR PRESSURE WIRE/FFR STUDY;  Surgeon: Kyra Phy, MD;  Location: [facility];  Service: Cardiovascular;  Laterality: N/A;   CORONARY STENT INTERVENTION N/A 08/30/2022   Procedure: CORONARY STENT INTERVENTION;  Surgeon: Kyra Phy, MD;  [facility]: [facility];  Service: Cardiovascular;  Laterality:  N/A;   CORONARY STENT INTERVENTION N/A 07/15/2023   Procedure: CORONARY STENT INTERVENTION;  Surgeon: Cody Das, MD;  [facility]: [facility];  Service: Cardiovascular;  Laterality: N/A;   CORONARY ULTRASOUND/IVUS N/A 07/15/2023   Procedure: Coronary Ultrasound/IVUS;  Surgeon: Cody Das, MD;  [facility];  Service: Cardiovascular;  Laterality: N/A;   CYST REMOVAL TRUNK  09/04/2012   Procedure: CYST REMOVAL TRUNK;  Surgeon: Shela Derby, MD;  Location: [facility];  Service: General;  Laterality: N/A;  EXCISION OF MID BACK MASS   EYE SURGERY Left DR.SON   CATARACT  SX1/2019   INCISION AND DRAINAGE ABSCESS Left 07/31/2013   Procedure: INCISIONAL/NON-INCISIONAL DEBRIDEMENT OF ABSCESS LEFT KNEE;  Surgeon: Bevin Bucks, MD;  Location: [facility];  Service: Orthopedics;  Laterality: Left;   KIDNEY STONE SURGERY     multiple episodes    LEFT HEART CATH AND CORONARY ANGIOGRAPHY N/A 08/30/2022   Procedure: LEFT HEART CATH AND CORONARY ANGIOGRAPHY;  Surgeon: Kyra Phy, MD;  [facility]: [facility];  Service: Cardiovascular;  Laterality: N/A;   LEFT HEART CATH AND CORONARY ANGIOGRAPHY N/A 07/15/2023   Procedure: LEFT HEART CATH AND CORONARY ANGIOGRAPHY;  Surgeon: Cody Das, MD;  [facility];  Service: Cardiovascular;  Laterality: N/A;   Right Knee Arthroscopy  2010   [facility]   TONSILLECTOMY         Home Medications    Prior to Admission medications   Medication Sig Start Date End Date Taking? Authorizing Provider  mupirocin ointment (BACTROBAN) 2 % Apply 1 Application topically 2 (two) times daily. 01/03/24  Yes Starlene Eaton, FNP  AMBULATORY NON FORMULARY MEDICATION Prosthetic Leg Liners 10/12/18   Ezell Hollow, MD  AMBULATORY NON FORMULARY MEDICATION at bedtime. ResMed CPAP Machine    [provider]  Aspirin  81 MG EC tablet Take 1 tablet (81 mg total) by mouth daily. 08/30/22   Goodrich, Callie E, PA-C  carvedilol  (COREG ) 12.5 MG tablet Take 1 tablet (12.5 mg total) by mouth 2 (two) times daily. 12/30/23 03/29/24  Lenise Quince, MD  Cholecalciferol (VITAMIN D3) 50 MCG (2000 UT) capsule Take 2,000 Units by mouth daily.    [provider]  clopidogrel  (PLAVIX ) 75 MG tablet TAKE 1 TABLET(75 MG) BY MOUTH DAILY WITH BREAKFAST 05/16/23   Lenise Quince, MD  Cyanocobalamin  (VITAMIN B-12 PO) Take 1 tablet by mouth daily.    [provider]  irbesartan  (AVAPRO ) 150 MG tablet TAKE 1 TABLET(150 MG) BY MOUTH DAILY 12/09/23   Lenise Quince, MD  isosorbide  mononitrate (IMDUR ) 60 MG 24 hr tablet  TAKE 1 AND 1/2 TABLETS(90 MG) BY MOUTH EVERY EVENING 09/15/23   Lenise Quince, MD  meloxicam  (MOBIC ) 15 MG tablet TAKE 1 TABLET(15 MG) BY MOUTH DAILY. PATIENT TAKE AS NEEDED 07/21/23   Graig Lawyer, MD  nitroGLYCERIN  (NITROSTAT ) 0.4 MG SL tablet Place 1 tablet (0.4 mg total) under the tongue every 5 minutes as needed for chest pain. 08/30/22   Goodrich, Callie E, PA-C  Nitroglycerin  0.4 % OINT Place 1 Application rectally in the morning and at bedtime. Apply 1 inch (375 mg) ointment intra-anally every 12 hours for anal fissure 09/09/22   Tobin Forts, MD  potassium citrate  (UROCIT-K ) 10 MEQ (1080 MG) SR tablet TAKE 2 TABLET BY MOUTH DAILY 02/21/23   Graig Lawyer, MD  rosuvastatin  (CRESTOR ) 40 MG tablet TAKE 1 TABLET(40 MG) BY MOUTH  DAILY 08/12/23   Lenise Quince, MD  TURMERIC PO Take 1,500-3,000 mg by mouth daily. 1500 mg each alternate every mouth    [provider]    Family History Family History  Problem Relation Age of Onset   Stroke Mother    Cancer Mother        Ovary, Breast   Heart attack Father 11   Heart attack Paternal Grandfather    Colon cancer Neg Hx    Prostate cancer Neg Hx    Diabetes Neg Hx    Esophageal cancer Neg Hx    Rectal cancer Neg Hx    Stomach cancer Neg Hx     Social History Social History   Tobacco Use   Smoking status: Former    Current packs/day: 0.00    Average packs/day: 0.8 packs/day for 20.0 years (15.0 ttl pk-yrs)    Types: Cigarettes, E-cigarettes    Start date: 1995    Quit date: 2015    Years since quitting: 10.3   Smokeless tobacco: Never   Tobacco comments:    currently doing some e-cigarettes  Vaping Use   Vaping status: Some Days   Substances: Nicotine  Substance Use Topics   Alcohol use: Yes    Comment: bourbon 3 fingers 3 x/week.   Drug use: No     Allergies   Penicillins, Tylox [oxycodone-acetaminophen ], and Codeine   Review of Systems Review of Systems Per HPI  Physical Exam Triage Vital  Signs ED Triage Vitals  Encounter Vitals Group     BP 01/03/24 0920 (!) 148/67     Systolic BP Percentile --      Diastolic BP Percentile --      Pulse Rate 01/03/24 0920 69     Resp --      Temp 01/03/24 0920 98.1 F (36.7 C)     Temp Source 01/03/24 0920 Oral     SpO2 01/03/24 0920 95 %     Weight --      Height --      Head Circumference --      Peak Flow --      Pain Score 01/03/24 0925 0     Pain Loc --      Pain Education --      Exclude from Growth Chart --    No data found.  Updated Vital Signs BP (!) 148/67 (BP Location: Right Arm)   Pulse 69   Temp 98.1 F (36.7 C) (Oral)   SpO2 95%   Visual Acuity Right Eye Distance:   Left Eye Distance:   Bilateral Distance:    Right Eye Near:   Left Eye Near:    Bilateral Near:     Physical Exam Vitals and nursing note reviewed.  Constitutional:      Appearance: He is not ill-appearing or toxic-appearing.  HENT:     Head: Normocephalic and atraumatic.     Right Ear: Hearing and external ear normal.     Left Ear: Hearing and external ear normal.     Nose: Nose normal.     Mouth/Throat:     Lips: Pink.  Eyes:     General: Lids are normal. Vision grossly intact. Gaze aligned appropriately.     Extraocular Movements: Extraocular movements intact.     Conjunctiva/sclera: Conjunctivae normal.  Pulmonary:     Effort: Pulmonary effort is normal.  Musculoskeletal:     Cervical back: Neck supple.     Left Lower Extremity: Left leg  is amputated below knee.  Skin:    General: Skin is warm and dry.     Findings: Erythema and lesion present. No rash.     Comments: Lesion to the left BKA stump is approximately 3cm by 1.5cm. Non-draining and non-tender. No palpable warmth or underlying soft tissue swelling/abscess. He is able to ambulate with use of his prosthetic. Sensation intact. +2 left popliteal pulse. See image below.   Neurological:     General: No focal deficit present.     Mental Status: He is alert and oriented  to person, place, and time. Mental status is at baseline.     Cranial Nerves: No dysarthria or facial asymmetry.  Psychiatric:        Mood and Affect: Mood normal.        Speech: Speech normal.        Behavior: Behavior normal.        Thought Content: Thought content normal.        Judgment: Judgment normal.    Left BKA ulcer lesion   Martin Treatments / Results  Labs (all labs ordered are listed, but only abnormal results are displayed) Labs Reviewed - No data to display  EKG   Radiology No results found.  Procedures Procedures (including critical care time)  Medications Ordered in Martin Medications  bacitracin ointment 1 Application (1 Application Topical Given 01/03/24 1014)    Initial Impression / Assessment and Plan / Martin Course  I have reviewed the triage vital signs and the nursing notes.  Pertinent labs & imaging results that were available during my care of the patient were reviewed by me and considered in my medical decision making (see chart for details).   1. Non-pressure ulcer of stump of below knee amputation, BKA stump complication Left BKA stump lesion suspicious for friction injury due to ill fitting prosthetic due to fluctuations in weight and moist environment. No signs of expansive cellulitis. He's neurovascularly intact to baseline, non-tender. No signs of systemic infection.  Mupirocin ointment ordered to be applied BID for 7 days.  Wound care discussed. Cover with gauze while using prosthetic device, leave open to air at bedtime and while without prosthetic.   Recommend follow-up with PCP in the next 4-5 days for re-check, sooner if signs of infection are noted as discussed.   Counseled patient on potential for adverse effects with medications prescribed/recommended today, strict ER and return-to-clinic precautions discussed, patient verbalized understanding.   Final Clinical Impressions(s) / Martin Diagnoses   Final diagnoses:  BKA stump complication  (HCC)  Non-pressure ulcer of stump of below knee amputation of left lower extremity, limited to breakdown of skin (HCC)     Discharge Instructions      Apply mupirocin ointment to the wound every 12 hours for the next 7 days.  Cover with nonstick dressing while wearing leg. Allow wound to be open to air at bedtime. Watch for new/worsening signs of infection such as redness, swelling, pain, pus, fever, chills.  If you develop any new or worsening symptoms or if your symptoms do not start to improve, please return here or follow-up with your primary care provider. If your symptoms are severe, please go to the emergency room.  ED Prescriptions     Medication Sig Dispense Auth. Provider   mupirocin ointment (BACTROBAN) 2 % Apply 1 Application topically 2 (two) times daily. 22 g Starlene Eaton, FNP      PDMP not reviewed this encounter.   Starlene Eaton, FNP  01/03/24 2058  

## 2024-01-05 ENCOUNTER — Other Ambulatory Visit: Payer: Self-pay | Admitting: Cardiology

## 2024-01-05 DIAGNOSIS — R0602 Shortness of breath: Secondary | ICD-10-CM

## 2024-01-08 ENCOUNTER — Ambulatory Visit (HOSPITAL_COMMUNITY)

## 2024-01-09 ENCOUNTER — Encounter: Payer: Self-pay | Admitting: Cardiology

## 2024-01-09 ENCOUNTER — Ambulatory Visit (HOSPITAL_COMMUNITY)
Admission: RE | Admit: 2024-01-09 | Discharge: 2024-01-09 | Disposition: A | Discharge status: Home / Self Care | Source: Ambulatory Visit | Attending: Internal Medicine | Admitting: Internal Medicine

## 2024-01-09 DIAGNOSIS — R0602 Shortness of breath: Secondary | ICD-10-CM | POA: Insufficient documentation

## 2024-01-09 LAB — MYOCARDIAL PERFUSION IMAGING
LV dias vol: 104 mL (ref 62–150)
LV sys vol: 43 mL
Nuc Stress EF: 59 %
Peak HR: 82 {beats}/min
Rest HR: 60 {beats}/min
Rest Nuclear Isotope Dose: 10.6 mCi
SDS: 8
SRS: 12
SSS: 8
ST Depression (mm): 0 mm
Stress Nuclear Isotope Dose: 32.8 mCi

## 2024-01-09 MED ORDER — REGADENOSON 0.4 MG/5ML IV SOLN
0.4000 mg | Freq: Once | INTRAVENOUS | Status: AC
  Administered 2024-01-09: 0.4 mg via INTRAVENOUS

## 2024-01-09 MED ORDER — TECHNETIUM TC 99M TETROFOSMIN IV KIT
32.8000 | PACK | Freq: Once | INTRAVENOUS | Status: AC | PRN
  Administered 2024-01-09: 32.8 via INTRAVENOUS

## 2024-01-09 MED ORDER — REGADENOSON 0.4 MG/5ML IV SOLN
INTRAVENOUS | Status: AC
  Filled 2024-01-09: qty 5

## 2024-01-09 MED ORDER — TECHNETIUM TC 99M TETROFOSMIN IV KIT
10.6000 | PACK | Freq: Once | INTRAVENOUS | Status: AC | PRN
  Administered 2024-01-09: 10.6 via INTRAVENOUS

## 2024-01-10 ENCOUNTER — Ambulatory Visit: Payer: Self-pay | Admitting: Cardiology

## 2024-01-15 ENCOUNTER — Ambulatory Visit: Payer: Self-pay | Admitting: Family Medicine

## 2024-01-15 ENCOUNTER — Encounter: Payer: Self-pay | Admitting: Family Medicine

## 2024-01-16 ENCOUNTER — Ambulatory Visit: Admitting: Physician Assistant

## 2024-01-22 ENCOUNTER — Encounter: Payer: Self-pay | Admitting: Cardiology

## 2024-01-28 ENCOUNTER — Encounter: Payer: Self-pay | Admitting: Family Medicine

## 2024-01-30 ENCOUNTER — Encounter: Payer: Self-pay | Admitting: Family Medicine

## 2024-02-02 ENCOUNTER — Encounter: Payer: Self-pay | Admitting: Cardiology

## 2024-02-03 ENCOUNTER — Encounter: Payer: Self-pay | Admitting: Family Medicine

## 2024-02-06 DIAGNOSIS — R0609 Other forms of dyspnea: Secondary | ICD-10-CM | POA: Diagnosis not present

## 2024-02-06 DIAGNOSIS — G4733 Obstructive sleep apnea (adult) (pediatric): Secondary | ICD-10-CM | POA: Diagnosis not present

## 2024-02-06 DIAGNOSIS — R0602 Shortness of breath: Secondary | ICD-10-CM | POA: Diagnosis not present

## 2024-02-09 ENCOUNTER — Encounter: Payer: Self-pay | Admitting: Family Medicine

## 2024-02-09 ENCOUNTER — Ambulatory Visit (INDEPENDENT_AMBULATORY_CARE_PROVIDER_SITE_OTHER): Admitting: Family Medicine

## 2024-02-09 VITALS — BP 124/76 | HR 74 | Temp 97.4°F | Ht 64.0 in | Wt 196.4 lb

## 2024-02-09 DIAGNOSIS — R0609 Other forms of dyspnea: Secondary | ICD-10-CM

## 2024-02-09 DIAGNOSIS — M5431 Sciatica, right side: Secondary | ICD-10-CM

## 2024-02-09 MED ORDER — METHYLPREDNISOLONE 4 MG PO TBPK: ORAL_TABLET | ORAL | 0 refills | Status: DC

## 2024-02-09 NOTE — Assessment & Plan Note (Signed)
 Discussed potential respiratory causes of DOE. I agree with the plan to do PFTs. Discussed in light of finding of possible mild interstitial thickening on his x-ray, the DLCO can give an idea if he may be having trouble with his oxygen diffusion capacity. He may also need a CT scan to further evaluate this.

## 2024-02-09 NOTE — Progress Notes (Signed)
 [facility] [address] [place] [address] Dept: [phone] Dept Fax: [phone]  Office Visit  Subjective:    Patient ID: Devin Martin, male    DOB: 04/09/1946, 78 y.o..   MRN: [id number]  Chief Complaint  Patient presents with   Follow-up    C/o having pain (sciatic nerve) and SOB on/off.    History of Present Illness:  Patient is in today with a flare of sciatica in his right leg. He has had issues with this over the past several months. He has been working with Zachary Smith, DO (sports medicine) on a stretching regimen that has helped. However, Mr. Makki was recently in [place] /[place] . He found the walking he was doing flared his sciatica more. He has a trip to [place]  planned for late July. He is concerned about how this sciatica might impair his ability to enjoy the trip.  Mr. Blann has also been struggling with dyspnea on exertion. He noted this particularly during his recent trip. He had seen cardiology. They have not found evidence that this is cardiac related. he saw Dr. Matilde Son (pulmonology) on Friday. He had a CXR and is scheduled for PFTs.  Past Medical History: Patient Active Problem List   Diagnosis Date Noted   DOE (dyspnea on exertion) 02/06/2024   Piriformis syndrome of right side 10/09/2023   Kidney cysts 08/25/2023   Right bundle branch block 06/25/2023   COVID-19 04/09/2023   Allergy to adhesive tape 01/24/2023   Angina pectoris (HCC) 08/30/2022   Obesity (BMI 30.0-34.9) 10/19/2021   Prediabetes 10/19/2021   Remote history of stroke 06/03/2019   Cholecystitis 01/16/2017   S/P BKA (below knee amputation) (HCC) 05/06/2016   Osteoarthritis of right knee 12/31/2013   Hypogonadism male 08/12/2011   GERD 04/05/2010   Essential hypertension 01/26/2009   Hyperlipidemia 06/23/2007   Obstructive sleep apnea 06/23/2007   Coronary artery disease involving native coronary artery of native heart  with angina pectoris (HCC) 06/23/2007   History of kidney stones 06/17/2007   Past Surgical History:  Procedure Laterality Date   AMPUT TRAUM LEG BELOW KNEE UNILT W/O COMPL Left    CARDIAC CATHETERIZATION     CORONARY IMAGING/OCT N/A 08/30/2022   Procedure: INTRAVASCULAR IMAGING/OCT;  Surgeon: Kyra Phy, MD;  [facility]: [facility];  Service: Cardiovascular;  Laterality: N/A;   CORONARY PRESSURE/FFR STUDY N/A 08/30/2022   Procedure: INTRAVASCULAR PRESSURE WIRE/FFR STUDY;  Surgeon: Kyra Phy, MD;  [facility]: [facility];  Service: Cardiovascular;  Laterality: N/A;   CORONARY STENT INTERVENTION N/A 08/30/2022   Procedure: CORONARY STENT INTERVENTION;  Surgeon: Kyra Phy, MD;  [facility]: [facility];  Service: Cardiovascular;  Laterality: N/A;   CORONARY STENT INTERVENTION N/A 07/15/2023   Procedure: CORONARY STENT INTERVENTION;  Surgeon: Cody Das, MD;  [facility];  Service: Cardiovascular;  Laterality: N/A;   CORONARY ULTRASOUND/IVUS N/A 07/15/2023   Procedure: Coronary Ultrasound/IVUS;  Surgeon: Cody Das, MD;  [facility]: [facility];  Service: Cardiovascular;  Laterality: N/A;   CYST REMOVAL TRUNK  09/04/2012   Procedure: CYST REMOVAL TRUNK;  Surgeon: Shela Derby, MD;  Location: [facility];  Service: General;  Laterality: N/A;  EXCISION OF MID BACK MASS   EYE SURGERY Left DR.SON   CATARACT SX1/2019   INCISION AND DRAINAGE ABSCESS Left 07/31/2013   Procedure: INCISIONAL/NON-INCISIONAL DEBRIDEMENT OF ABSCESS LEFT KNEE;  Surgeon: Bevin Bucks, MD;  Location: [facility];  Service: Orthopedics;  Laterality: Left;   KIDNEY STONE SURGERY     multiple episodes    LEFT HEART CATH AND CORONARY ANGIOGRAPHY N/A 08/30/2022   Procedure: LEFT HEART CATH AND CORONARY ANGIOGRAPHY;  Surgeon: Kyra Phy, MD;  [facility];  Service: Cardiovascular;  Laterality: N/A;   LEFT HEART CATH  AND CORONARY ANGIOGRAPHY N/A 07/15/2023   Procedure: LEFT HEART CATH AND CORONARY ANGIOGRAPHY;  Surgeon: Cody Das, MD;  [facility];  Service: Cardiovascular;  Laterality: N/A;   Right Knee Arthroscopy  2010   [facility]   TONSILLECTOMY     Family History  Problem Relation Age of Onset   Stroke Mother    Cancer Mother        Ovary, Breast   Heart attack Father 85   Heart attack Paternal Grandfather    Colon cancer Neg Hx    Prostate cancer Neg Hx    Diabetes Neg Hx    Esophageal cancer Neg Hx    Rectal cancer Neg Hx    Stomach cancer Neg Hx    Outpatient Medications Prior to Visit  Medication Sig Dispense Refill   AMBULATORY NON FORMULARY MEDICATION Prosthetic Leg Liners 2 application 0   AMBULATORY NON FORMULARY MEDICATION at bedtime. ResMed CPAP Machine     Aspirin  81 MG EC tablet Take 1 tablet (81 mg total) by mouth daily.     carvedilol  (COREG ) 12.5 MG tablet Take 1 tablet (12.5 mg total) by mouth 2 (two) times daily. 180 tablet 3   Cholecalciferol (VITAMIN D3) 50 MCG (2000 UT) capsule Take 2,000 Units by mouth daily.     clopidogrel  (PLAVIX ) 75 MG tablet TAKE 1 TABLET(75 MG) BY MOUTH DAILY WITH BREAKFAST 90 tablet 3   Cyanocobalamin  (VITAMIN B-12 PO) Take 1 tablet by mouth daily.     irbesartan  (AVAPRO ) 150 MG tablet TAKE 1 TABLET(150 MG) BY MOUTH DAILY 90 tablet 0   isosorbide  mononitrate (IMDUR ) 60 MG 24 hr tablet TAKE 1 AND 1/2 TABLETS(90 MG) BY MOUTH EVERY EVENING 135 tablet 3   meloxicam  (MOBIC ) 15 MG tablet TAKE 1 TABLET(15 MG) BY MOUTH DAILY. PATIENT TAKE AS NEEDED 30 tablet 5   mupirocin  ointment (BACTROBAN ) 2 % Apply 1 Application topically 2 (two) times daily. 22 g 0   nitroGLYCERIN  (NITROSTAT ) 0.4 MG SL tablet Place 1 tablet (0.4 mg total) under the tongue every 5 minutes as needed for chest pain. 25 tablet 2   Nitroglycerin  0.4 % OINT Place 1 Application rectally in the morning and at bedtime. Apply 1 inch (375 mg) ointment intra-anally  every 12 hours for anal fissure 30 g 1   potassium citrate  (UROCIT-K ) 10 MEQ (1080 MG) SR tablet TAKE 2 TABLET BY MOUTH DAILY 180 tablet 3   rosuvastatin  (CRESTOR ) 40 MG tablet TAKE 1 TABLET(40 MG) BY MOUTH DAILY 90 tablet 3   TURMERIC PO Take 1,500-3,000 mg by mouth daily. 1500 mg each alternate every mouth     No facility-administered medications prior to visit.   Allergies  Allergen Reactions   Penicillins Hives and Other (See Comments)    syncope   Tylox [Oxycodone-Acetaminophen ] Other (See Comments)    messes me up   Codeine Nausea Only     Objective:   Today's Vitals   02/09/24 1102  BP: 124/76  Pulse: 74  Temp: (!) 97.4 F (36.3 C)  TempSrc: Temporal  SpO2: 97%  Weight: 196 lb 6.4 oz (89.1 kg)  Height: 5' 4 (1.626 m)   Body mass index is 33.71 kg/m.   General: Well developed, well nourished. No acute distress. Psych: Alert and oriented. Normal mood and affect.  Health Maintenance Due  Topic Date Due   Zoster Vaccines- Shingrix  (1 of 2) 11/14/1997   Imaging: Chest X-ray (02/06/2024) FINDINGS:  Supportive devices: No internal support apparatus imaged. Cardiovascular/Mediastinum: Mild cardiomegaly. Lungs: Possible mild interstitial thickening. Pleura: No pleural effusion. No pneumothorax. Upper abdomen: Unremarkable. Osseous Structures: No acute displaced fractures. Polyarticular degenerative changes including multilevel degenerative changes of the visualized spine. Soft tissues: Unremarkable.     IMPRESSION: Borderline cardiomegaly with possible mild pulmonary edema.   Assessment & Plan:   Problem List Items Addressed This Visit       Other   DOE (dyspnea on exertion) - Primary   Discussed potential respiratory causes of DOE. I agree with the plan to do PFTs. Discussed in light of finding of possible mild interstitial thickening on his x-ray, the DLCO can give an idea if he may be having trouble with his oxygen diffusion capacity. He may also need a  CT scan to further evaluate this.      Other Visit Diagnoses       Right sided sciatica       I will prescribe a Medrol dosepak to see if we can resolve his sciatica. Cotninue stretches and working with Dr. Felipe Horton.   Relevant Medications   methylPREDNISolone (MEDROL DOSEPAK) 4 MG TBPK tablet       Return in about 3 months (around 05/11/2024) for Reassessment.   Graig Lawyer, MD

## 2024-02-11 ENCOUNTER — Encounter: Payer: Self-pay | Admitting: Family Medicine

## 2024-02-11 DIAGNOSIS — M5431 Sciatica, right side: Secondary | ICD-10-CM

## 2024-02-17 DIAGNOSIS — J984 Other disorders of lung: Secondary | ICD-10-CM | POA: Diagnosis not present

## 2024-02-17 DIAGNOSIS — Z87891 Personal history of nicotine dependence: Secondary | ICD-10-CM | POA: Diagnosis not present

## 2024-02-18 NOTE — Addendum Note (Signed)
 Addended by: THEDORA GARNETTE HERO on: 02/18/2024 08:43 AM   Modules accepted: Orders

## 2024-02-25 ENCOUNTER — Encounter: Payer: Self-pay | Admitting: Cardiology

## 2024-02-25 ENCOUNTER — Encounter: Payer: Self-pay | Admitting: Family Medicine

## 2024-02-25 DIAGNOSIS — R0609 Other forms of dyspnea: Secondary | ICD-10-CM | POA: Diagnosis not present

## 2024-02-25 DIAGNOSIS — G4733 Obstructive sleep apnea (adult) (pediatric): Secondary | ICD-10-CM | POA: Diagnosis not present

## 2024-02-25 DIAGNOSIS — J849 Interstitial pulmonary disease, unspecified: Secondary | ICD-10-CM | POA: Diagnosis not present

## 2024-02-25 DIAGNOSIS — Z87891 Personal history of nicotine dependence: Secondary | ICD-10-CM | POA: Diagnosis not present

## 2024-02-25 NOTE — Progress Notes (Signed)
 Devin Martin [facility]in [facility] [address] [place] [address] Phone: [phone] Subjective:   Devin Martin, am serving as a scribe for Dr. Arthea Martin.  I'm seeing this patient by the request  of:  Devin Martin HERO, MD  CC: Low back pain  Devin Martin  11/19/2023 Responded very well to the leg length and the home exercises.  Now given more core strengthening exercises.  Having difficulty with weight loss and discussed some potential vitamin supplementations but to be careful for potential side effects.  Increase activity slowly.  Follow-up again in 6 to 8 weeks otherwise.     Updated 03/01/2024 Devin Martin is a 78 y.o. male coming in with complaint of piriformis.  Patient was somewhat responding initially and was responding to home exercises, TENS unit and some meloxicam .  Since we have seen patient went on a long trip. Took prednisone  for a lung issue he had a few weeks ago. Pain in hip and back are returning. Patient saw Dr. Dalldorf and it was recommended that he get an epidural. Wants to know if he needs epidural where should he go. Does stretches and take meloxicam . Unable to walk long distances on some day but then can do a distance on other days with little pain. Wants to know which is worse to take prednisone  or the epidural.    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 08-2009 - non-ST segment elevation myocardial infarction, felt secondary to vasospasm.   Calculus, kidney    hx   Complication of anesthesia     itching after preop med  ?name - 2013, no SOB   Glaucoma    Hyperlipidemia    Hypertension    Myocardial infarction [facility]) 2005   x2, 2005, 2010   OSA (obstructive sleep apnea)    AHI 68/hr in 2002-uses a cpap   Pneumonia    hx of    Sleep apnea    wears CPAP   Wears glasses    Past Surgical History:  Procedure Laterality Date   AMPUT TRAUM LEG BELOW KNEE UNILT W/O COMPL Left    CARDIAC CATHETERIZATION     CORONARY IMAGING/OCT N/A 08/30/2022   Procedure: INTRAVASCULAR IMAGING/OCT;  Surgeon: Devin Devin POUR, MD;  [facility]: [facility];  Service: Cardiovascular;  Laterality: N/A;   CORONARY PRESSURE/FFR STUDY N/A 08/30/2022   Procedure: INTRAVASCULAR PRESSURE WIRE/FFR STUDY;  Surgeon: Devin Devin POUR, MD;  Location: [facility];  Service: Cardiovascular;  Laterality: N/A;   CORONARY STENT INTERVENTION N/A 08/30/2022   Procedure: CORONARY STENT INTERVENTION;  Surgeon: Devin Devin POUR, MD;  Location: [facility];  Service: Cardiovascular;  Laterality: N/A;   CORONARY STENT INTERVENTION N/A 07/15/2023   Procedure: CORONARY STENT INTERVENTION;  Surgeon: Devin Devin PARAS, MD;  Location: [facility];  Service: Cardiovascular;  Laterality: N/A;   CORONARY ULTRASOUND/IVUS N/A 07/15/2023   Procedure: Coronary Ultrasound/IVUS;  Surgeon: Devin Devin PARAS, MD;  [facility]: [facility];  Service: Cardiovascular;  Laterality: N/A;   CYST REMOVAL TRUNK  09/04/2012   Procedure: CYST REMOVAL TRUNK;  Surgeon: Devin Leos, MD;  Location: [facility];  Service: General;  Laterality: N/A;  EXCISION OF MID BACK MASS   EYE SURGERY Left Devin Martin   CATARACT SX1/2019   INCISION AND DRAINAGE ABSCESS Left 07/31/2013   Procedure: INCISIONAL/NON-INCISIONAL DEBRIDEMENT OF ABSCESS LEFT KNEE;  Surgeon: Devin JONETTA Car, MD;  Location: [facility];  Service: Orthopedics;  Laterality: Left;   KIDNEY STONE SURGERY     multiple episodes    LEFT HEART CATH AND CORONARY  ANGIOGRAPHY N/A 08/30/2022   Procedure: LEFT HEART CATH AND CORONARY ANGIOGRAPHY;  Surgeon: Devin Devin POUR, MD;  [facility]: [facility];  Service: Cardiovascular;  Laterality: N/A;   LEFT HEART CATH AND CORONARY ANGIOGRAPHY N/A 07/15/2023   Procedure: LEFT HEART CATH AND CORONARY ANGIOGRAPHY;  Surgeon: Devin Devin PARAS, MD;  Location: [facility];  Service: Cardiovascular;  Laterality: N/A;   Right Knee Arthroscopy  2010   [facility]   TONSILLECTOMY     Social History   Socioeconomic History   Marital status: Married    Spouse name: Not on file   Number of children: 2   Years of education: Not on file   Highest education level: Bachelor's degree (e.g., BA, AB, BS)  Occupational History   Occupation: Art gallery manager-- RETIRED 2012  Tobacco Use   Smoking status: Former    Current packs/day: 0.00    Average packs/day: 0.8 packs/day for 20.0 years (15.0 ttl pk-yrs)    Types: Cigarettes, E-cigarettes    Start date: 1995    Quit date: 2015    Years since quitting: 10.5   Smokeless tobacco: Never   Tobacco comments:    currently doing some e-cigarettes  Vaping Use   Vaping status: Some Days   Substances: Nicotine  Substance and Sexual Activity   Alcohol use: Yes    Comment: bourbon 3 fingers 3 x/week.   Drug use: No   Sexual activity: Not Currently  Other Topics Concern   Not on file  Social History Narrative   Lives w/ wife    Social Drivers of Health   Financial Resource Strain: Low Risk  (02/05/2024)   Overall Financial Resource Strain (CARDIA)    Difficulty of Paying Living Expenses: Not hard at all  Food Insecurity: Low Risk  (02/25/2024)   Received from [facility]   Hunger Vital Sign    Within the past 12 months, you worried that your food would run out before you got money to buy more: Never true    Within the past 12 months, the food you bought just didn't last and you didn't have money to get more. : Never true  Transportation Needs: No  Transportation Needs (02/25/2024)   Received from [facility]    In the past 12 months, has lack of reliable transportation kept you from medical appointments, meetings, work or from getting things needed for daily living? : No  Physical Activity: Sufficiently Active (02/05/2024)   Exercise Vital Sign    Days of Exercise per Week: 7 days    Minutes of Exercise per Session: 110 min  Stress: No Stress Concern Present (02/05/2024)   [employer] of Occupational Health - Occupational Stress Questionnaire    Feeling of Stress: Not at all  Social Connections: Socially Integrated (02/05/2024)   Social Connection and Isolation Panel    Frequency of Communication with Friends and  Family: More than three times a week    Frequency of Social Gatherings with Friends and Family: Twice a week    Attends Religious Services: 1 to 4 times per year    Active Member of [employer] or Organizations: Yes    Attends Banker Meetings: 1 to 4 times per year    Marital Status: Married   Allergies  Allergen Reactions   Penicillins Hives and Other (See Comments)    syncope   Tylox [Oxycodone-Acetaminophen ] Other (See Comments)    messes me up   Codeine Nausea Only   Family History  Problem Relation Age of Onset   Stroke Mother    Cancer Mother        Ovary, Breast   Heart attack Father 90   Heart attack Paternal Grandfather    Colon cancer Neg Hx    Prostate cancer Neg Hx    Diabetes Neg Hx    Esophageal cancer Neg Hx    Rectal cancer Neg Hx    Stomach cancer Neg Hx     Current Outpatient Medications (Endocrine & Metabolic):    methylPREDNISolone  (MEDROL  DOSEPAK) 4 MG TBPK tablet, Take per package instructions.  Current Outpatient Medications (Cardiovascular):    carvedilol  (COREG ) 12.5 MG tablet, Take 1 tablet (12.5 mg total) by mouth 2 (two) times daily.   irbesartan  (AVAPRO ) 150 MG tablet, TAKE 1 TABLET(150 MG) BY MOUTH DAILY   isosorbide  mononitrate (IMDUR ) 60 MG  24 hr tablet, TAKE 1 AND 1/2 TABLETS(90 MG) BY MOUTH EVERY EVENING   nitroGLYCERIN  (NITROSTAT ) 0.4 MG SL tablet, Place 1 tablet (0.4 mg total) under the tongue every 5 minutes as needed for chest pain.   rosuvastatin  (CRESTOR ) 40 MG tablet, TAKE 1 TABLET(40 MG) BY MOUTH DAILY   Current Outpatient Medications (Analgesics):    Aspirin  81 MG EC tablet, Take 1 tablet (81 mg total) by mouth daily.   meloxicam  (MOBIC ) 15 MG tablet, TAKE 1 TABLET(15 MG) BY MOUTH DAILY. PATIENT TAKE AS NEEDED  Current Outpatient Medications (Hematological):    clopidogrel  (PLAVIX ) 75 MG tablet, TAKE 1 TABLET(75 MG) BY MOUTH DAILY WITH BREAKFAST   Cyanocobalamin  (VITAMIN B-12 PO), Take 1 tablet by mouth daily.  Current Outpatient Medications (Other):    AMBULATORY NON FORMULARY MEDICATION, Prosthetic Leg Liners   AMBULATORY NON FORMULARY MEDICATION, at bedtime. ResMed CPAP Machine   Cholecalciferol (VITAMIN D3) 50 MCG (2000 UT) capsule, Take 2,000 Units by mouth daily.   mupirocin  ointment (BACTROBAN ) 2 %, Apply 1 Application topically 2 (two) times daily.   Nitroglycerin  0.4 % OINT, Place 1 Application rectally in the morning and at bedtime. Apply 1 inch (375 mg) ointment intra-anally every 12 hours for anal fissure   potassium citrate  (UROCIT-K ) 10 MEQ (1080 MG) SR tablet, TAKE 2 TABLET BY MOUTH DAILY   TURMERIC PO, Take 1,500-3,000 mg by mouth daily. 1500 mg each alternate every mouth   Reviewed prior external information including notes and imaging from  primary care provider As well as notes that were available from care everywhere and other healthcare systems.  Past medical history, social, surgical and family history all reviewed in electronic medical record.  No pertanent information unless stated regarding to the chief complaint.   Review of Systems:  No headache, visual changes, nausea, vomiting, diarrhea, constipation, dizziness, abdominal pain, skin rash, fevers, chills, night sweats, weight loss,  swollen lymph nodes, body aches, joint swelling, chest pain, shortness of breath, mood changes. POSITIVE muscle aches  Objective  Blood pressure  120/84, pulse (!) 56, height 5' 4 (1.626 m), weight 196 lb (88.9 kg), SpO2 97%.   General: No apparent distress alert and oriented x3 mood and affect normal, dressed appropriately.  HEENT: Pupils equal, extraocular movements intact  Respiratory: Patient's speak in full sentences and does not appear short of breath  Cardiovascular: No lower extremity edema, non tender, no erythema  Back exam shows loss.  Tightness noted.  Prosthesis of the left lower extremity noted.  Mild tightness with Deri right greater than left.  Weakness noted with hip abductor of 4 out of 5 strength bilaterally.    Impression and Recommendations:     The above documentation has been reviewed and is accurate and complete Nzinga Ferran M Dagmar Adcox, DO

## 2024-03-01 ENCOUNTER — Ambulatory Visit: Admitting: Family Medicine

## 2024-03-01 ENCOUNTER — Telehealth: Payer: Self-pay

## 2024-03-01 ENCOUNTER — Other Ambulatory Visit: Payer: Self-pay

## 2024-03-01 VITALS — BP 120/84 | HR 56 | Ht 64.0 in | Wt 196.0 lb

## 2024-03-01 DIAGNOSIS — M51362 Other intervertebral disc degeneration, lumbar region with discogenic back pain and lower extremity pain: Secondary | ICD-10-CM

## 2024-03-01 DIAGNOSIS — M51369 Other intervertebral disc degeneration, lumbar region without mention of lumbar back pain or lower extremity pain: Secondary | ICD-10-CM | POA: Insufficient documentation

## 2024-03-01 DIAGNOSIS — M25551 Pain in right hip: Secondary | ICD-10-CM

## 2024-03-01 DIAGNOSIS — M5416 Radiculopathy, lumbar region: Secondary | ICD-10-CM

## 2024-03-01 NOTE — Telephone Encounter (Signed)
   Pre-operative Risk Assessment    Patient Name: Devin Martin  DOB: 1946-08-16 MRN: [id number]   Date of last office visit: 12/30/23 Pietro, MD Date of next office visit: 04/08/24 Pietro, MD   Request for Surgical Clearance    Procedure:  Lumbar Epidural  Date of Surgery:  Clearance TBD                                 Surgeon:  Not indicated Surgeon's Group or Practice Name:  [facility]  Phone number:  [phone] Fax number:  [phone]   Type of Clearance Requested:   - Medical  - Pharmacy:  Hold Aspirin  and Clopidogrel  (Plavix ) HOLD for 5 days   Type of Anesthesia:  Not Indicated   Additional requests/questions:    Devin Martin   03/01/2024, 5:03 PM

## 2024-03-01 NOTE — Assessment & Plan Note (Addendum)
 Patient has more of a right sided radiculopathy that does occur after 100 feet.  He thought it was more of a spinal stenosis and still think that there is some facet arthritic changes as causing more of a foraminal stenosis only on the right side.  I do believe that this is starting to give him more discomfort.  Discussed icing regimen and home exercises, discussed which activities to do and which ones to avoid.  Increase activity slowly.  After long discussion patient has elected to try the epidural at the L4-L5 area.  I am very hopeful that this would make significant improvement.  Increase activity slowly.  Total time reviewing the MRI and discussing with patient 33 minutes

## 2024-03-01 NOTE — Patient Instructions (Signed)
 L4/L5 epidural If it does not work start prednisone  the day before.  Do not take meloxicam  and prednisone  together Avoid extension of back See me 6 weeks after epidural injection

## 2024-03-02 ENCOUNTER — Telehealth: Payer: Self-pay | Admitting: *Deleted

## 2024-03-02 NOTE — Telephone Encounter (Signed)
 Tele preop appt 03/04/24. Med rec and consent are done.     Patient Consent for Virtual Visit        Devin Martin has provided verbal consent on 03/02/2024 for a virtual visit (video or telephone).   CONSENT FOR VIRTUAL VISIT FOR:  Devin Martin  By participating in this virtual visit I agree to the following:  I hereby voluntarily request, consent and authorize [facility] and its employed or contracted physicians, physician assistants, nurse practitioners or other licensed health care professionals (the Practitioner), to provide me with telemedicine health care services (the "Services) as deemed necessary by the treating Practitioner. I acknowledge and consent to receive the Services by the Practitioner via telemedicine. I understand that the telemedicine visit will involve communicating with the Practitioner through live audiovisual communication technology and the disclosure of certain medical information by electronic transmission. I acknowledge that I have been given the opportunity to request an in-person assessment or other available alternative prior to the telemedicine visit and am voluntarily participating in the telemedicine visit.  I understand that I have the right to withhold or withdraw my consent to the use of telemedicine in the course of my care at any time, without affecting my right to future care or treatment, and that the Practitioner or I may terminate the telemedicine visit at any time. I understand that I have the right to inspect all information obtained and/or recorded in the course of the telemedicine visit and may receive copies of available information for a reasonable fee.  I understand that some of the potential risks of receiving the Services via telemedicine include:  Delay or interruption in medical evaluation due to technological equipment failure or disruption; Information transmitted may not be sufficient (e.g. poor resolution of images) to allow  for appropriate medical decision making by the Practitioner; and/or  In rare instances, security protocols could fail, causing a breach of personal health information.  Furthermore, I acknowledge that it is my responsibility to provide information about my medical history, conditions and care that is complete and accurate to the best of my ability. I acknowledge that Practitioner's advice, recommendations, and/or decision may be based on factors not within their control, such as incomplete or inaccurate data provided by me or distortions of diagnostic images or specimens that may result from electronic transmissions. I understand that the practice of medicine is not an exact science and that Practitioner makes no warranties or guarantees regarding treatment outcomes. I acknowledge that a copy of this consent can be made available to me via my patient portal [facility] MyChart), or I can request a printed copy by calling the office of [facility].    I understand that my insurance will be billed for this visit.   I have read or had this consent read to me. I understand the contents of this consent, which adequately explains the benefits and risks of the Services being provided via telemedicine.  I have been provided ample opportunity to ask questions regarding this consent and the Services and have had my questions answered to my satisfaction. I give my informed consent for the services to be provided through the use of telemedicine in my medical care

## 2024-03-02 NOTE — Telephone Encounter (Addendum)
 Tele preop appt 03/04/24. Med rec and consent are done.  Pt states procedure won't be scheduled until he has been cleared.   PROVIDER FOR INJECTION IS DR. Hayes [facility]

## 2024-03-02 NOTE — Telephone Encounter (Signed)
Left message for the pt to call back to schedule tele pre op appt

## 2024-03-02 NOTE — Telephone Encounter (Signed)
   Name: Devin Martin  DOB: 11-23-1945  MRN: [id number]  Primary Cardiologist: Redell Shallow, MD   Preoperative team, please contact this patient and set up a phone call appointment for further preoperative risk assessment. Please obtain consent and complete medication review. Thank you for your help.  I confirm that guidance regarding antiplatelet and oral anticoagulation therapy has been completed and, if necessary, noted below.  Patient is post PCI/DES in 06/2023.  Per Dr. Shallow, Okay to hold Plavix  5 to 7 days prior to procedure and resume 2 days after.  Patient needs to continue aspirin . Redell Shallow   I also confirmed the patient resides in the state of [place] . As per [facility] Medical Board telemedicine laws, the patient must reside in the state in which the provider is licensed.   Damien JAYSON Braver, NP 03/02/2024, 8:41 AM [facility]

## 2024-03-02 NOTE — Telephone Encounter (Signed)
 I will send this preop APP to review notes from Dr. Arthea Sharps. Preop APP instructed the pt needs a tele preop appt. I will clarify if tele appt is needed.

## 2024-03-02 NOTE — Telephone Encounter (Signed)
 Patient is returning call.

## 2024-03-03 DIAGNOSIS — Z89512 Acquired absence of left leg below knee: Secondary | ICD-10-CM | POA: Diagnosis not present

## 2024-03-04 ENCOUNTER — Encounter: Payer: Self-pay | Admitting: Cardiology

## 2024-03-04 ENCOUNTER — Ambulatory Visit: Attending: Cardiology | Admitting: Cardiology

## 2024-03-04 DIAGNOSIS — Z01810 Encounter for preprocedural cardiovascular examination: Secondary | ICD-10-CM | POA: Diagnosis not present

## 2024-03-04 NOTE — Progress Notes (Addendum)
 Virtual Visit via Telephone Note   Because of MYRON STANKOVICH co-morbid illnesses, he is at least at moderate risk for complications without adequate follow up.  This format is felt to be most appropriate for this patient at this time.  Due to technical limitations with video connection (technology), today's appointment will be conducted as an audio only telehealth visit, and ERIE RADU verbally agreed to proceed in this manner.   All issues noted in this document were discussed and addressed.  No physical exam could be performed with this format.  Evaluation Performed:  Preoperative cardiovascular risk assessment _____________   Date:  03/04/2024   Patient ID:  Norleen JONELLE Rouse, DOB [birth year], MRN [id number] Patient Location:  Home Provider location:   Office  Primary Care Provider:  Thedora Garnette HERO, MD Primary Cardiologist:  Redell Shallow, MD  Chief Complaint / Patient Profile  78 y.o. y/o male with a h/o CAD s/p DES to mid LAD in 2005, DES to proximal left circumflex in 08/2022, s/p PTCA and DES placement to mid LAD in 06/2023, hypertension, hyperlipidemia who is pending lumbar epidural and presents today for telephonic preoperative cardiovascular risk assessment. History of Present Illness   NATTHEW MARLATT is a 78 y.o. male who presents via audio/video conferencing for a telehealth visit today.  Pt was last seen in cardiology clinic on 12/30/2023 by Dr. Shallow.  At that time MICKEY ESGUERRA was doing well overall, he did endorse dyspnea on exertion.  Patient underwent nuclear stress test on 01/09/2024 that was considered low risk with no evidence of ischemia. The patient is now pending procedure as outlined above. Since his last visit, he has remained stable from cardiac standpoint. He reports that he is now following with pulmonology, was started on a prednisone  taper with improvement in his breathing. Today he denies chest pain,  lower extremity edema, fatigue, palpitations, melena,  hematuria, hemoptysis, diaphoresis, weakness, presyncope, syncope, orthopnea, and PND. He exercises at least an hour daily and tolerates well.  Past Medical History    Past Medical History:  Diagnosis Date   Allergy    ANXIETY 10/13/2009   Qualifier: Diagnosis of  By: Shallow, MD, CODY Redell Dimes    Arthritis 1968   pt wears a lt bk prosthesis   Bronchitis, chronic (HCC)    CAD (coronary artery disease)    Non-ST segment elevation myocardial infarction in 2005 with drug-eluting stent to the left anterior descending. 08-2009 - non-ST segment elevation myocardial infarction, felt secondary to vasospasm.   Calculus, kidney    hx   Complication of anesthesia    itching after preop med  ?name - 2013, no SOB   Glaucoma    Hyperlipidemia    Hypertension    Myocardial infarction [facility]) 2005   x2, 2005, 2010   OSA (obstructive sleep apnea)    AHI 68/hr in 2002-uses a cpap   Pneumonia    hx of    Sleep apnea    wears CPAP   Wears glasses    Past Surgical History:  Procedure Laterality Date   AMPUT TRAUM LEG BELOW KNEE UNILT W/O COMPL Left    CARDIAC CATHETERIZATION     CORONARY IMAGING/OCT N/A 08/30/2022   Procedure: INTRAVASCULAR IMAGING/OCT;  Surgeon: Wendel Lurena POUR, MD;  Location: [facility];  Service: Cardiovascular;  Laterality: N/A;   CORONARY PRESSURE/FFR STUDY N/A 08/30/2022   Procedure: INTRAVASCULAR PRESSURE WIRE/FFR STUDY;  Surgeon: Wendel Lurena POUR, MD;  Location: [facility] INVASIVE CV  LAB;  Service: Cardiovascular;  Laterality: N/A;   CORONARY STENT INTERVENTION N/A 08/30/2022   Procedure: CORONARY STENT INTERVENTION;  Surgeon: Wendel Lurena POUR, MD;  Location: [facility];  Service: Cardiovascular;  Laterality: N/A;   CORONARY STENT INTERVENTION N/A 07/15/2023   Procedure: CORONARY STENT INTERVENTION;  Surgeon: Elmira Newman PARAS, MD;  Location: [facility];  Service: Cardiovascular;  Laterality: N/A;   CORONARY ULTRASOUND/IVUS N/A 07/15/2023    Procedure: Coronary Ultrasound/IVUS;  Surgeon: Elmira Newman PARAS, MD;  [facility]: [facility];  Service: Cardiovascular;  Laterality: N/A;   CYST REMOVAL TRUNK  09/04/2012   Procedure: CYST REMOVAL TRUNK;  Surgeon: Lynda Leos, MD;  Location: [facility];  Service: General;  Laterality: N/A;  EXCISION OF MID BACK MASS   EYE SURGERY Left DR.SON   CATARACT SX1/2019   INCISION AND DRAINAGE ABSCESS Left 07/31/2013   Procedure: INCISIONAL/NON-INCISIONAL DEBRIDEMENT OF ABSCESS LEFT KNEE;  Surgeon: Donnice JONETTA Car, MD;  Location: [facility];  Service: Orthopedics;  Laterality: Left;   KIDNEY STONE SURGERY     multiple episodes    LEFT HEART CATH AND CORONARY ANGIOGRAPHY N/A 08/30/2022   Procedure: LEFT HEART CATH AND CORONARY ANGIOGRAPHY;  Surgeon: Wendel Lurena POUR, MD;  [facility]: [facility];  Service: Cardiovascular;  Laterality: N/A;   LEFT HEART CATH AND CORONARY ANGIOGRAPHY N/A 07/15/2023   Procedure: LEFT HEART CATH AND CORONARY ANGIOGRAPHY;  Surgeon: Elmira Newman PARAS, MD;  [facility]: [facility];  Service: Cardiovascular;  Laterality: N/A;   Right Knee Arthroscopy  2010   [facility]   TONSILLECTOMY      Allergies  Allergies  Allergen Reactions   Penicillins Hives and Other (See Comments)    syncope   Tylox [Oxycodone-Acetaminophen ] Other (See Comments)    messes me up   Codeine Nausea Only    Home Medications    Prior to Admission medications   Medication Sig Start Date End Date Taking? Authorizing Provider  AMBULATORY NON FORMULARY MEDICATION Prosthetic Leg Liners 10/12/18   Amon Aloysius BRAVO, MD  AMBULATORY NON FORMULARY MEDICATION at bedtime. ResMed CPAP Machine    [provider]  Aspirin  81 MG EC tablet Take 1 tablet (81 mg total) by mouth daily. 08/30/22   Goodrich, Callie E, PA-C  carvedilol  (COREG ) 12.5 MG tablet Take 1 tablet (12.5 mg total) by mouth 2 (two) times daily. 12/30/23 03/29/24  Pietro Redell RAMAN, MD  Cholecalciferol  (VITAMIN D3) 50 MCG (2000 UT) capsule Take 2,000 Units by mouth daily.    [provider]  clopidogrel  (PLAVIX ) 75 MG tablet TAKE 1 TABLET(75 MG) BY MOUTH DAILY WITH BREAKFAST 05/16/23   Pietro Redell RAMAN, MD  Cyanocobalamin  (VITAMIN B-12 PO) Take 1 tablet by mouth daily.    [provider]  irbesartan  (AVAPRO ) 150 MG tablet TAKE 1 TABLET(150 MG) BY MOUTH DAILY 12/09/23   Pietro Redell RAMAN, MD  isosorbide  mononitrate (IMDUR ) 60 MG 24 hr tablet TAKE 1 AND 1/2 TABLETS(90 MG) BY MOUTH EVERY EVENING 09/15/23   Pietro Redell RAMAN, MD  meloxicam  (MOBIC ) 15 MG tablet TAKE 1 TABLET(15 MG) BY MOUTH DAILY. PATIENT TAKE AS NEEDED 07/21/23   Thedora Garnette HERO, MD  methylPREDNISolone  (MEDROL  DOSEPAK) 4 MG TBPK tablet Take per package instructions. 02/09/24   Thedora Garnette HERO, MD  mupirocin  ointment (BACTROBAN ) 2 % Apply 1 Application topically 2 (two) times daily. 01/03/24   Enedelia Dorna HERO, FNP  nitroGLYCERIN  (NITROSTAT ) 0.4 MG SL tablet Place 1  tablet (0.4 mg total) under the tongue every 5 minutes as needed for chest pain. 08/30/22   Goodrich, Callie E, PA-C  Nitroglycerin  0.4 % OINT Place 1 Application rectally in the morning and at bedtime. Apply 1 inch (375 mg) ointment intra-anally every 12 hours for anal fissure 09/09/22   Abran Norleen SAILOR, MD  potassium citrate  (UROCIT-K ) 10 MEQ (1080 MG) SR tablet TAKE 2 TABLET BY MOUTH DAILY 02/21/23   Thedora Garnette HERO, MD  rosuvastatin  (CRESTOR ) 40 MG tablet TAKE 1 TABLET(40 MG) BY MOUTH DAILY 08/12/23   Pietro Redell RAMAN, MD  TURMERIC PO Take 1,500-3,000 mg by mouth daily. 1500 mg each alternate every mouth    [provider]    Physical Exam  Vital Signs:  WILKIN LIPPY does not have vital signs available for review today. Given telephonic nature of communication, physical exam is limited. AAOx3. NAD. Normal affect.  Speech and respirations are unlabored. Accessory Clinical Findings  None Assessment & Plan    1.  Preoperative Cardiovascular  Risk Assessment: Mr. Acevedo perioperative risk of a major cardiac event is 0.9% according to the Revised Cardiac Risk Index (RCRI).  Therefore, he is at low risk for perioperative complications.   His functional capacity is good at 7.34 METs according to the Duke Activity Status Index (DASI). Recommendations: According to ACC/AHA guidelines, no further cardiovascular testing needed.  The patient may proceed to surgery at acceptable risk.   Antiplatelet and/or Anticoagulation Recommendations: Patient is post PCI/DES in 06/2023.  Per Dr. Pietro, Okay to hold Plavix  5 to 7 days prior to procedure and resume 2 days after.  Patient needs to continue aspirin .  ADDENDUM 03/05/24: Notified by requesting office that they would be unable to perform procedure if patient were to remain on aspirin .  Discussed with Dr. Pietro, per Dr. Pietro, If he needs the procedure, can hold ASA 5 days prior to procedure and resume the day after   The patient was advised that if he develops new symptoms prior to surgery to contact our office to arrange for a follow-up visit, and he verbalized understanding.  A copy of this note will be routed to requesting surgeon.  Time:   Today, I have spent 12 minutes with the patient with telehealth technology discussing medical history, symptoms, and management plan.    Inaya Gillham D Roshunda Keir, NP  03/04/2024, 4:11 PM

## 2024-03-05 ENCOUNTER — Other Ambulatory Visit: Payer: Self-pay | Admitting: Cardiology

## 2024-03-05 NOTE — Telephone Encounter (Signed)
 Dr. Pietro, [facility] has notified the office that they will be unable to perform lumbar epidural while patient remains on aspirin . We have informed office that per your direction, he needs to remain on aspirin  at this time given PCI/DES in 06/2023, they intend to cancel procedure. They would like to know when patient will be able to hold both aspirin  and Plavix  for lumbar epidural. Please route your response to P CV DIV PREOP. Thank you!

## 2024-03-08 ENCOUNTER — Encounter: Payer: Self-pay | Admitting: Family Medicine

## 2024-03-08 NOTE — Telephone Encounter (Signed)
 Tele visit from 03/04/24 has been addended with Dr. Vertie recommendations. Please see note.

## 2024-03-09 ENCOUNTER — Ambulatory Visit: Admitting: Physical Therapy

## 2024-03-15 DIAGNOSIS — I2510 Atherosclerotic heart disease of native coronary artery without angina pectoris: Secondary | ICD-10-CM | POA: Diagnosis not present

## 2024-03-15 DIAGNOSIS — J439 Emphysema, unspecified: Secondary | ICD-10-CM | POA: Diagnosis not present

## 2024-03-15 DIAGNOSIS — I700 Atherosclerosis of aorta: Secondary | ICD-10-CM | POA: Diagnosis not present

## 2024-03-15 DIAGNOSIS — J849 Interstitial pulmonary disease, unspecified: Secondary | ICD-10-CM | POA: Diagnosis not present

## 2024-03-15 DIAGNOSIS — J9809 Other diseases of bronchus, not elsewhere classified: Secondary | ICD-10-CM | POA: Diagnosis not present

## 2024-03-15 DIAGNOSIS — R918 Other nonspecific abnormal finding of lung field: Secondary | ICD-10-CM | POA: Diagnosis not present

## 2024-03-15 DIAGNOSIS — K8020 Calculus of gallbladder without cholecystitis without obstruction: Secondary | ICD-10-CM | POA: Diagnosis not present

## 2024-03-15 DIAGNOSIS — J432 Centrilobular emphysema: Secondary | ICD-10-CM | POA: Diagnosis not present

## 2024-03-15 DIAGNOSIS — J84112 Idiopathic pulmonary fibrosis: Secondary | ICD-10-CM | POA: Diagnosis not present

## 2024-03-15 NOTE — Discharge Instructions (Signed)
 Post Procedure Spinal Discharge Instruction Sheet  You may resume a regular diet and any medications that you routinely take (including pain medications) unless otherwise noted by MD.  No driving day of procedure.  Light activity throughout the rest of the day.  Do not do any strenuous work, exercise, bending or lifting.  The day following the procedure, you can resume normal physical activity but you should refrain from exercising or physical therapy for at least three days thereafter.  You may apply ice to the injection site, 20 minutes on, 20 minutes off, as needed. Do not apply ice directly to skin.    Common Side Effects:  Headaches- take your usual medications as directed by your physician.  Increase your fluid intake.  Caffeinated beverages may be helpful.  Lie flat in bed until your headache resolves.  Restlessness or inability to sleep- you may have trouble sleeping for the next few days.  Ask your referring physician if you need any medication for sleep.  Facial flushing or redness- should subside within a few days.  Increased pain- a temporary increase in pain a day or two following your procedure is not unusual.  Take your pain medication as prescribed by your referring physician.  Leg cramps  Please contact our office at [phone] for the following symptoms: Fever greater than 100 degrees. Headaches unresolved with medication after 2-3 days. Increased swelling, pain, or redness at injection site.   Thank you for visiting [facility] today.   YOU MAY RESUME YOUR ASPIRIN AND PLAVIX TODAY.

## 2024-03-16 ENCOUNTER — Ambulatory Visit
Admission: RE | Admit: 2024-03-16 | Discharge: 2024-03-16 | Disposition: A | Discharge status: Home / Self Care | Source: Ambulatory Visit | Attending: Family Medicine | Admitting: Family Medicine

## 2024-03-16 DIAGNOSIS — M4726 Other spondylosis with radiculopathy, lumbar region: Secondary | ICD-10-CM | POA: Diagnosis not present

## 2024-03-16 DIAGNOSIS — M4727 Other spondylosis with radiculopathy, lumbosacral region: Secondary | ICD-10-CM | POA: Diagnosis not present

## 2024-03-16 DIAGNOSIS — M48061 Spinal stenosis, lumbar region without neurogenic claudication: Secondary | ICD-10-CM | POA: Diagnosis not present

## 2024-03-16 DIAGNOSIS — M5416 Radiculopathy, lumbar region: Secondary | ICD-10-CM

## 2024-03-16 MED ORDER — METHYLPREDNISOLONE ACETATE 40 MG/ML INJ SUSP (RADIOLOG
80.0000 mg | Freq: Once | INTRAMUSCULAR | Status: AC
  Administered 2024-03-16: 80 mg via EPIDURAL

## 2024-03-16 MED ORDER — IOPAMIDOL (ISOVUE-M 200) INJECTION 41%
1.0000 mL | Freq: Once | INTRAMUSCULAR | Status: AC
  Administered 2024-03-16: 1 mL via EPIDURAL

## 2024-03-18 ENCOUNTER — Encounter: Payer: Self-pay | Admitting: Family Medicine

## 2024-03-23 ENCOUNTER — Ambulatory Visit

## 2024-03-31 NOTE — Progress Notes (Signed)
 HPI: FU coronary artery disease. The patient underwent cardiac catheterization in December 2005 secondary to a non-ST elevation myocardial infarction. At that time, his ejection fraction was 60%. He had successful PCI of his mid LAD with a drug-eluting stent. Note, there was jailing of 2 small diagonals by the LAD stent.  Abdominal ultrasound 2/17 showed no aneurysm. Patient had repeat catheterization January 2024 due to exertional chest pain.  The stent in his proximal LAD had moderate in-stent restenosis (50) with normal FFR, 90% proximal circumflex lesion treated with drug-eluting stent and LVEDP of 20 mmHg.  Repeat catheterization November 2024 showed 95% in the distal edge of the prior LAD stent, patent stent in the circumflex and minimal irregularities in the right coronary artery; patient had PCI of the LAD.  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 Nuclear study May 2025 showed inferoseptal infarct but no ischemia with overall normal LV function.  Ejection fraction 59%.  Since I last saw him, his dyspnea on exertion has improved.  No orthopnea, PND, pedal edema, chest pain or syncope.  Current Outpatient Medications  Medication Sig Dispense Refill   AMBULATORY NON FORMULARY MEDICATION Prosthetic Leg Liners 2 application 0   AMBULATORY NON FORMULARY MEDICATION at bedtime. ResMed CPAP Machine     Aspirin  81 MG EC tablet Take 1 tablet (81 mg total) by mouth daily.     carvedilol  (COREG ) 12.5 MG tablet Take 1 tablet (12.5 mg total) by mouth 2 (two) times daily. 180 tablet 3   Cholecalciferol (VITAMIN D3) 50 MCG (2000 UT) capsule Take 2,000 Units by mouth daily.     clopidogrel  (PLAVIX ) 75 MG tablet TAKE 1 TABLET(75 MG) BY MOUTH DAILY WITH BREAKFAST 90 tablet 3   Cyanocobalamin  (VITAMIN B-12 PO) Take 1 tablet by mouth daily.     irbesartan  (AVAPRO ) 150 MG tablet TAKE 1 TABLET(150 MG) BY  MOUTH DAILY 90 tablet 3   isosorbide  mononitrate (IMDUR ) 60 MG 24 hr tablet TAKE 1 AND 1/2 TABLETS(90 MG) BY MOUTH EVERY EVENING 135 tablet 3   meloxicam  (MOBIC ) 15 MG tablet TAKE 1 TABLET(15 MG) BY MOUTH DAILY. PATIENT TAKE AS NEEDED 30 tablet 5   mupirocin  ointment (BACTROBAN ) 2 % Apply 1 Application topically 2 (two) times daily. 22 g 0   nitroGLYCERIN  (NITROSTAT ) 0.4 MG SL tablet Place 1 tablet (0.4 mg total) under the tongue every 5 minutes as needed for chest pain. 25 tablet 2   potassium citrate  (UROCIT-K ) 10 MEQ (1080 MG) SR tablet TAKE 2 TABLET BY MOUTH DAILY 180 tablet 3   rosuvastatin  (CRESTOR ) 40 MG tablet TAKE 1 TABLET(40 MG) BY MOUTH DAILY 90 tablet 3   TURMERIC PO Take 1,500-3,000 mg by mouth daily. 1500 mg each alternate every mouth     methylPREDNISolone  (MEDROL  DOSEPAK) 4 MG TBPK tablet Take per package instructions. (Patient not taking: Reported on 04/08/2024) 21 tablet 0   Nitroglycerin  0.4 % OINT Place 1 Application rectally in the morning and at bedtime. Apply 1 inch (375 mg) ointment intra-anally every 12 hours for anal fissure (Patient not taking: Reported on 04/08/2024) 30 g 1   No current facility-administered medications for this visit.     Past Medical History:  Diagnosis Date   Allergy    ANXIETY 10/13/2009   Qualifier: Diagnosis of  By: Pietro, MD, CODY Redell Dimes    Arthritis 1968   pt wears a lt bk prosthesis   Bronchitis, chronic (  HCC)    CAD (coronary artery disease)    Non-ST segment elevation myocardial infarction in 2005 with drug-eluting stent to the left anterior descending. 08-2009 - non-ST segment elevation myocardial infarction, felt secondary to vasospasm.   Calculus, kidney    hx   Complication of anesthesia    itching after preop med  ?name - 2013, no SOB   Glaucoma    Hyperlipidemia    Hypertension    Myocardial infarction [facility]) 2005   x2, 2005, 2010   OSA (obstructive sleep apnea)    AHI 68/hr in 2002-uses a cpap   Pneumonia     hx of    Sleep apnea    wears CPAP   Wears glasses     Past Surgical History:  Procedure Laterality Date   AMPUT TRAUM LEG BELOW KNEE UNILT W/O COMPL Left    CARDIAC CATHETERIZATION     CORONARY IMAGING/OCT N/A 08/30/2022   Procedure: INTRAVASCULAR IMAGING/OCT;  Surgeon: Wendel Lurena POUR, MD;  Location: [facility];  Service: Cardiovascular;  Laterality: N/A;   CORONARY PRESSURE/FFR STUDY N/A 08/30/2022   Procedure: INTRAVASCULAR PRESSURE WIRE/FFR STUDY;  Surgeon: Wendel Lurena POUR, MD;  Location: [facility];  Service: Cardiovascular;  Laterality: N/A;   CORONARY STENT INTERVENTION N/A 08/30/2022   Procedure: CORONARY STENT INTERVENTION;  Surgeon: Wendel Lurena POUR, MD;  [facility];  Service: Cardiovascular;  Laterality: N/A;   CORONARY STENT INTERVENTION N/A 07/15/2023   Procedure: CORONARY STENT INTERVENTION;  Surgeon: Elmira Newman PARAS, MD;  [facility]: [facility];  Service: Cardiovascular;  Laterality: N/A;   CORONARY ULTRASOUND/IVUS N/A 07/15/2023   Procedure: Coronary Ultrasound/IVUS;  Surgeon: Elmira Newman PARAS, MD;  [facility];  Service: Cardiovascular;  Laterality: N/A;   CYST REMOVAL TRUNK  09/04/2012   Procedure: CYST REMOVAL TRUNK;  Surgeon: Lynda Leos, MD;  Location: [facility];  Service: General;  Laterality: N/A;  EXCISION OF MID BACK MASS   EYE SURGERY Left DR.SON   CATARACT SX1/2019   INCISION AND DRAINAGE ABSCESS Left 07/31/2013   Procedure: INCISIONAL/NON-INCISIONAL DEBRIDEMENT OF ABSCESS LEFT KNEE;  Surgeon: Donnice JONETTA Car, MD;  Location: [facility];  Service: Orthopedics;  Laterality: Left;   KIDNEY STONE SURGERY     multiple episodes    LEFT HEART CATH AND CORONARY ANGIOGRAPHY N/A 08/30/2022   Procedure: LEFT HEART CATH AND CORONARY ANGIOGRAPHY;  Surgeon: Wendel Lurena POUR, MD;  [facility];  Service: Cardiovascular;  Laterality: N/A;   LEFT HEART CATH AND CORONARY  ANGIOGRAPHY N/A 07/15/2023   Procedure: LEFT HEART CATH AND CORONARY ANGIOGRAPHY;  Surgeon: Elmira Newman PARAS, MD;  [facility];  Service: Cardiovascular;  Laterality: N/A;   Right Knee Arthroscopy  2010   [facility]   TONSILLECTOMY      Social History   Socioeconomic History   Marital status: Married    Spouse name: Not on file   Number of children: 2   Years of education: Not on file   Highest education level: Bachelor's degree (e.g., BA, AB, BS)  Occupational History   Occupation: Art gallery manager-- RETIRED 2012  Tobacco Use   Smoking status: Former    Current packs/day: 0.00    Average packs/day: 0.8 packs/day for 20.0 years (15.0 ttl pk-yrs)    Types: Cigarettes, E-cigarettes    Start date: 1995    Quit date: 2015    Years since quitting: 10.6   Smokeless tobacco:  Never   Tobacco comments:    currently doing some e-cigarettes  Vaping Use   Vaping status: Some Days   Substances: Nicotine  Substance and Sexual Activity   Alcohol use: Yes    Comment: bourbon 3 fingers 3 x/week.   Drug use: No   Sexual activity: Not Currently  Other Topics Concern   Not on file  Social History Narrative   Lives w/ wife    Social Drivers of Health   Financial Resource Strain: Low Risk  (02/05/2024)   Overall Financial Resource Strain (CARDIA)    Difficulty of Paying Living Expenses: Not hard at all  Food Insecurity: Low Risk  (04/07/2024)   Received from [facility]   Hunger Vital Sign    Within the past 12 months, you worried that your food would run out before you got money to buy more: Never true    Within the past 12 months, the food you bought just didn't last and you didn't have money to get more. : Never true  Transportation Needs: No Transportation Needs (04/07/2024)   Received from [facility]    In the past 12 months, has lack of reliable transportation kept you from medical appointments, meetings, work or from getting things  needed for daily living? : No  Physical Activity: Sufficiently Active (02/05/2024)   Exercise Vital Sign    Days of Exercise per Week: 7 days    Minutes of Exercise per Session: 110 min  Stress: No Stress Concern Present (02/05/2024)   [employer] of Occupational Health - Occupational Stress Questionnaire    Feeling of Stress: Not at all  Social Connections: Socially Integrated (02/05/2024)   Social Connection and Isolation Panel    Frequency of Communication with Friends and Family: More than three times a week    Frequency of Social Gatherings with Friends and Family: Twice a week    Attends Religious Services: 1 to 4 times per year    Active Member of [employer] or Organizations: Yes    Attends Banker Meetings: 1 to 4 times per year    Marital Status: Married  Catering manager Violence: Not At Risk (12/15/2023)   Humiliation, Afraid, Rape, and Kick questionnaire    Fear of Current or Ex-Partner: No    Emotionally Abused: No    Physically Abused: No    Sexually Abused: No    Family History  Problem Relation Age of Onset   Stroke Mother    Cancer Mother        Ovary, Breast   Heart attack Father 26   Heart attack Paternal Grandfather    Colon cancer Neg Hx    Prostate cancer Neg Hx    Diabetes Neg Hx    Esophageal cancer Neg Hx    Rectal cancer Neg Hx    Stomach cancer Neg Hx     ROS: no fevers or chills, productive cough, hemoptysis, dysphasia, odynophagia, melena, hematochezia, dysuria, hematuria, rash, seizure activity, orthopnea, PND, pedal edema, claudication. Remaining systems are negative.  Physical Exam: Well-developed well-nourished in no acute distress.  Skin is warm and dry.  HEENT is normal.  Neck is supple.  Chest is clear to auscultation with normal expansion.  Cardiovascular exam is regular rate and rhythm.  Abdominal exam nontender or distended. No masses palpated. Extremities show no edema. neuro grossly intact  EKG  Interpretation Date/Time:  Thursday April 08 2024 10:09:58 EDT Ventricular Rate:  69 PR Interval:  168 QRS Duration:  144 QT Interval:  418 QTC Calculation: 447 R Axis:   60  Text Interpretation: Sinus rhythm with occasional Premature ventricular complexes Right bundle branch block Confirmed by Pietro Rogue ([id number]) on 04/08/2024 10:12:04 AM    A/P  1 coronary artery disease-continue aspirin , Plavix  and statin.  Will discontinue Plavix  November 2025.  Recent nuclear study showed infarct but no ischemia.  2 hypertension-blood pressure is controlled.  Continue present medications.  3 ischemic cardiomyopathy-mild LV dysfunction on most recent echocardiogram.  Continue ARB and beta-blocker.  4 hyperlipidemia-continue statin.  5 dyspnea-recent nuclear study showed no ischemia.  Follow-up proBNP 271.  Hemoglobin was normal.  Symptoms have improved as he was treated for potential lung infection and steroids.   Rogue Pietro, MD

## 2024-04-07 DIAGNOSIS — G4733 Obstructive sleep apnea (adult) (pediatric): Secondary | ICD-10-CM | POA: Diagnosis not present

## 2024-04-07 DIAGNOSIS — Z87891 Personal history of nicotine dependence: Secondary | ICD-10-CM | POA: Diagnosis not present

## 2024-04-07 DIAGNOSIS — J849 Interstitial pulmonary disease, unspecified: Secondary | ICD-10-CM | POA: Diagnosis not present

## 2024-04-07 DIAGNOSIS — G4721 Circadian rhythm sleep disorder, delayed sleep phase type: Secondary | ICD-10-CM | POA: Diagnosis not present

## 2024-04-08 ENCOUNTER — Encounter: Payer: Self-pay | Admitting: Cardiology

## 2024-04-08 ENCOUNTER — Ambulatory Visit: Attending: Cardiology | Admitting: Cardiology

## 2024-04-08 VITALS — BP 114/60 | HR 69 | Ht 65.0 in | Wt 200.0 lb

## 2024-04-08 DIAGNOSIS — I2510 Atherosclerotic heart disease of native coronary artery without angina pectoris: Secondary | ICD-10-CM

## 2024-04-08 DIAGNOSIS — E785 Hyperlipidemia, unspecified: Secondary | ICD-10-CM | POA: Diagnosis not present

## 2024-04-08 DIAGNOSIS — R0602 Shortness of breath: Secondary | ICD-10-CM

## 2024-04-08 DIAGNOSIS — I10 Essential (primary) hypertension: Secondary | ICD-10-CM | POA: Diagnosis not present

## 2024-04-08 NOTE — Patient Instructions (Signed)
 STOP PLAVIX  - CLOPIDOGRELTHE END OF NOVEMBER-CONTINUE ASPIRIN   Follow-Up: At [facility], you and your health needs are our priority.  As part of our continuing mission to provide you with exceptional heart care, our providers are all part of one team.  This team includes your primary Cardiologist (physician) and Advanced Practice Providers or APPs (Physician Assistants and Nurse Practitioners) who all work together to provide you with the care you need, when you need it.  Your next appointment:   6 month(s)  Provider:   Redell Shallow, MD

## 2024-04-10 ENCOUNTER — Other Ambulatory Visit: Payer: Self-pay | Admitting: Family Medicine

## 2024-04-10 DIAGNOSIS — Z87442 Personal history of urinary calculi: Secondary | ICD-10-CM

## 2024-04-21 ENCOUNTER — Encounter: Payer: Self-pay | Admitting: Family Medicine

## 2024-04-22 ENCOUNTER — Encounter: Payer: Self-pay | Admitting: Family Medicine

## 2024-05-07 ENCOUNTER — Ambulatory Visit: Admitting: Family Medicine

## 2024-05-08 ENCOUNTER — Other Ambulatory Visit: Payer: Self-pay | Admitting: Cardiology

## 2024-05-08 ENCOUNTER — Other Ambulatory Visit: Payer: Self-pay | Admitting: Family Medicine

## 2024-05-12 ENCOUNTER — Encounter: Payer: Self-pay | Admitting: Family Medicine

## 2024-05-19 ENCOUNTER — Ambulatory Visit (INDEPENDENT_AMBULATORY_CARE_PROVIDER_SITE_OTHER): Admitting: Family Medicine

## 2024-05-19 ENCOUNTER — Encounter: Payer: Self-pay | Admitting: Family Medicine

## 2024-05-19 VITALS — BP 126/70 | HR 59 | Temp 97.0°F | Ht 65.0 in | Wt 197.0 lb

## 2024-05-19 DIAGNOSIS — R0609 Other forms of dyspnea: Secondary | ICD-10-CM

## 2024-05-19 DIAGNOSIS — Z23 Encounter for immunization: Secondary | ICD-10-CM

## 2024-05-19 DIAGNOSIS — R5383 Other fatigue: Secondary | ICD-10-CM

## 2024-05-19 DIAGNOSIS — J439 Emphysema, unspecified: Secondary | ICD-10-CM | POA: Insufficient documentation

## 2024-05-19 DIAGNOSIS — I255 Ischemic cardiomyopathy: Secondary | ICD-10-CM | POA: Insufficient documentation

## 2024-05-19 DIAGNOSIS — J849 Interstitial pulmonary disease, unspecified: Secondary | ICD-10-CM | POA: Diagnosis not present

## 2024-05-19 DIAGNOSIS — E291 Testicular hypofunction: Secondary | ICD-10-CM | POA: Diagnosis not present

## 2024-05-19 NOTE — Assessment & Plan Note (Signed)
 Cardiology notes evidence for this on his echocardiogram. This could play a role in his DOE.

## 2024-05-19 NOTE — Assessment & Plan Note (Signed)
 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 [name] was never treated for this. We discussed that TRT could have modest improvements in his energy level, but not necessarily helpw ith weight loss. I will have him return for testing. We discussed about needing to have two low values to confirm hypogonadism. If this is low, we would plan to start testosterone  cypionate 100 mg weekly injections.

## 2024-05-19 NOTE — Progress Notes (Signed)
 [facility] [address] [place] [address] Dept: [phone] Dept Fax: [phone]  Office Visit  Subjective:    Patient ID: Devin Martin, male    DOB: [birth year], 78 y.o..   MRN: [id number]  Chief Complaint  Patient presents with   Follow-up    3 month f/u.  Fatigue/SOB, unable to loss weight.  ? Thyroid level & testosterone  checked.    History of Present Illness:  Patient is in today with ongoing concern regarding fatigue and dyspnea with exertion. Devin Martin began complaining of DOE in Oct. of last year. He had noted this while trying to play golf.  Devin Martin has a history of coronary artery disease with a two prior MIs, and several cardiac stents. His last cardiac cath prior to this visit was in Jan. 2024. It showed the stent in his proximal LAD had moderate in-stent restenosis (50%) with normal FFR, and a 90% proximal circumflex lesion treated with drug-eluting stent.  I had him see cardiology. cardiology repeat a cardiac cath in Nov. 2024, showing the stent in his proximal LAD had moderate proximal in-stent restenosis (40-50%) and 95% calcific stenosis in the distal stent and native artery. He had a new stent placed. He is managed on DAPT with aspirin  81 mg and clopidogrel  75 mg daily. He is also on isosorbide  60 mg 1 1/2 tabs daily, carvedilol  12.5 mg bid and rosuvastatin  40 mg daily. Devin Martin has a history of hypertension, managed on irbesartan  75 mg daily.  In June, Devin Martin saw me with continued complaints of DOE. He had gone on a trip to [place] [employer] and found this limited his activities. He had recently seen Dr. Shellia who did an evaluation of his pulmonary status. He was found to have mild interstitial lung disease and emphysema.  Devin Martin continues to have fatigue and DOE. He shows me a video of how short of breath he can become. In the video, he had just completed 3 minutes of use of an  elliptical at a level 10. The elliptical was running a weight loss routine. He notes he did recover fairly quickly. However, he remains convinced that he muse some undiscovered lung issue.  Devin Martin had also been worried about his weight. He went to a Medical spa in [place]. He notes he was provided tirzepatide. He rapidly tapered up form 5 mg to 20 mg weekly within the span of 1 month. He had very little weight loss with this and significant side effects, so has since quit. He was advised by the staff of the facility to talk to his doctor about thyroid testing and testosterone  testing, as he must have something wrong that prevented his weight loss.   Past Medical History: Patient Active Problem List   Diagnosis Date Noted   Degenerative disc disease, lumbar 03/01/2024   DOE (dyspnea on exertion) 02/06/2024   Piriformis syndrome of right side 10/09/2023   Kidney cysts 08/25/2023   Right bundle branch block 06/25/2023   COVID-19 04/09/2023   Allergy to adhesive tape 01/24/2023   Angina pectoris 08/30/2022   Obesity (BMI 30.0-34.9) 10/19/2021   Prediabetes 10/19/2021   Remote history of stroke 06/03/2019   Cholecystitis 01/16/2017   S/P BKA (below knee amputation) (HCC) 05/06/2016   Osteoarthritis of right knee 12/31/2013   Hypogonadism male 08/12/2011   GERD 04/05/2010   Essential hypertension 01/26/2009   Hyperlipidemia 06/23/2007   Obstructive sleep apnea 06/23/2007   Coronary artery disease involving  native coronary artery of native heart with angina pectoris 06/23/2007   History of kidney stones 06/17/2007   Past Surgical History:  Procedure Laterality Date   AMPUT TRAUM LEG BELOW KNEE UNILT W/O COMPL Left    CARDIAC CATHETERIZATION     CORONARY IMAGING/OCT N/A 08/30/2022   Procedure: INTRAVASCULAR IMAGING/OCT;  Surgeon: Wendel Lurena POUR, MD;  Location: [facility];  Service: Cardiovascular;  Laterality: N/A;   CORONARY PRESSURE/FFR STUDY N/A 08/30/2022    Procedure: INTRAVASCULAR PRESSURE WIRE/FFR STUDY;  Surgeon: Wendel Lurena POUR, MD;  Location: [facility];  Service: Cardiovascular;  Laterality: N/A;   CORONARY STENT INTERVENTION N/A 08/30/2022   Procedure: CORONARY STENT INTERVENTION;  Surgeon: Wendel Lurena POUR, MD;  Location: [facility];  Service: Cardiovascular;  Laterality: N/A;   CORONARY STENT INTERVENTION N/A 07/15/2023   Procedure: CORONARY STENT INTERVENTION;  Surgeon: Elmira Newman PARAS, MD;  Location: [facility];  Service: Cardiovascular;  Laterality: N/A;   CORONARY ULTRASOUND/IVUS N/A 07/15/2023   Procedure: Coronary Ultrasound/IVUS;  Surgeon: Elmira Newman PARAS, MD;  Location: [facility];  Service: Cardiovascular;  Laterality: N/A;   CYST REMOVAL TRUNK  09/04/2012   Procedure: CYST REMOVAL TRUNK;  Surgeon: Lynda Leos, MD;  Location: [facility];  Service: General;  Laterality: N/A;  EXCISION OF MID BACK MASS   EYE SURGERY Left DR.SON   CATARACT SX1/2019   INCISION AND DRAINAGE ABSCESS Left 07/31/2013   Procedure: INCISIONAL/NON-INCISIONAL DEBRIDEMENT OF ABSCESS LEFT KNEE;  Surgeon: Donnice JONETTA Car, MD;  Location: [facility];  Service: Orthopedics;  Laterality: Left;   KIDNEY STONE SURGERY     multiple episodes    LEFT HEART CATH AND CORONARY ANGIOGRAPHY N/A 08/30/2022   Procedure: LEFT HEART CATH AND CORONARY ANGIOGRAPHY;  Surgeon: Wendel Lurena POUR, MD;  Location: [facility];  Service: Cardiovascular;  Laterality: N/A;   LEFT HEART CATH AND CORONARY ANGIOGRAPHY N/A 07/15/2023   Procedure: LEFT HEART CATH AND CORONARY ANGIOGRAPHY;  Surgeon: Elmira Newman PARAS, MD;  Location: [facility];  Service: Cardiovascular;  Laterality: N/A;   Right Knee Arthroscopy  2010   [facility]   TONSILLECTOMY     Family History  Problem Relation Age of Onset   Stroke Mother    Cancer Mother        Ovary, Breast   Heart attack Father 50   Heart attack Paternal Grandfather     Colon cancer Neg Hx    Prostate cancer Neg Hx    Diabetes Neg Hx    Esophageal cancer Neg Hx    Rectal cancer Neg Hx    Stomach cancer Neg Hx    Outpatient Medications Prior to Visit  Medication Sig Dispense Refill   AMBULATORY NON FORMULARY MEDICATION Prosthetic Leg Liners 2 application 0   AMBULATORY NON FORMULARY MEDICATION at bedtime. ResMed CPAP Machine     Aspirin  81 MG EC tablet Take 1 tablet (81 mg total) by mouth daily.     carvedilol  (COREG ) 12.5 MG tablet Take 1 tablet (12.5 mg total) by mouth 2 (two) times daily. 180 tablet 3   Cholecalciferol (VITAMIN D3) 50 MCG (2000 UT) capsule Take 2,000 Units by mouth daily.     clopidogrel  (PLAVIX ) 75 MG tablet TAKE 1 TABLET(75 MG) BY MOUTH DAILY WITH BREAKFAST 90 tablet 3   Cyanocobalamin  (VITAMIN B-12 PO) Take 1 tablet by mouth daily.     irbesartan  (AVAPRO ) 150 MG tablet TAKE 1 TABLET(150 MG)  BY MOUTH DAILY 90 tablet 3   isosorbide  mononitrate (IMDUR ) 60 MG 24 hr tablet TAKE 1 AND 1/2 TABLETS(90 MG) BY MOUTH EVERY EVENING 135 tablet 3   meloxicam  (MOBIC ) 15 MG tablet TAKE 1 TABLET(15 MG) BY MOUTH DAILY AS NEEDED 30 tablet 5   mupirocin  ointment (BACTROBAN ) 2 % Apply 1 Application topically 2 (two) times daily. 22 g 0   nitroGLYCERIN  (NITROSTAT ) 0.4 MG SL tablet Place 1 tablet (0.4 mg total) under the tongue every 5 minutes as needed for chest pain. 25 tablet 2   Nitroglycerin  0.4 % OINT Place 1 Application rectally in the morning and at bedtime. Apply 1 inch (375 mg) ointment intra-anally every 12 hours for anal fissure 30 g 1   potassium citrate  (UROCIT-K ) 10 MEQ (1080 MG) SR tablet TAKE 2 TABLETS BY MOUTH DAILY 180 tablet 3   rosuvastatin  (CRESTOR ) 40 MG tablet TAKE 1 TABLET(40 MG) BY MOUTH DAILY 90 tablet 3   TURMERIC PO Take 1,500-3,000 mg by mouth daily. 1500 mg each alternate every mouth     albuterol (VENTOLIN HFA) 108 (90 Base) MCG/ACT inhaler SMARTSIG:2 Puff(s) By Mouth Every 6 Hours PRN     methylPREDNISolone  (MEDROL  DOSEPAK)  4 MG TBPK tablet Take per package instructions. (Patient not taking: Reported on 04/08/2024) 21 tablet 0   No facility-administered medications prior to visit.   Allergies  Allergen Reactions   Penicillins Hives and Other (See Comments)    syncope   Tylox [Oxycodone-Acetaminophen ] Other (See Comments)    messes me up   Codeine Nausea Only     Objective:   Today's Vitals   05/19/24 1348  BP: 126/70  Pulse: (!) 59  Temp: (!) 97 F (36.1 C)  TempSrc: Temporal  SpO2: 98%  Weight: 197 lb (89.4 kg)  Height: 5' 5 (1.651 m)   Body mass index is 32.78 kg/m.   General: Well developed, well nourished. No acute distress. Psych: Alert and oriented. Normal mood and affect.  Health Maintenance Due  Topic Date Due   Zoster Vaccines- Shingrix  (1 of 2) 11/14/1997      Assessment & Plan:   Problem List Items Addressed This Visit       Cardiovascular and Mediastinum   Ischemic cardiomyopathy   Cardiology notes evidence for this on his echocardiogram. This could play a role in his DOE.        Respiratory   Emphysema of lung (HCC)   Not a smoker. Continue albuterol as needed. This is likely a contributor to his DOE.      Relevant Medications   albuterol (VENTOLIN HFA) 108 (90 Base) MCG/ACT inhaler   Interstitial lung disease (HCC)   Mild overall. This may be a contributor to his DOE.        Endocrine   Hypogonadism male   Review of old records reveals low testosterone  measured on 2 occasions in 2012. Devin Martin was never treated for this. We discussed that TRT could have modest improvements in his energy level, but not necessarily helpw ith weight loss. I will have him return for testing. We discussed about needing to have two low values to confirm hypogonadism. If this is low, we would plan to start testosterone  cypionate 100 mg weekly injections.      Relevant Orders   FSH/LH   Testosterone    Testosterone      Other   DOE (dyspnea on exertion) - Primary   This is  likely multifactorial related to ischemic cardiomyopathy with coronary artery disease,  mild emphysema, and mild ILD. Devin Martin seems to feel this has been a sudden issue, but my notes indicate he has complained of this for almost a year. During that time, he underwent a repeat cath and stent placement. I am concerned that he may be over training and recommend against he trying to do sustained exercise at a level 10 on his elliptical. Although I can appreciate his desire to have an exercise tolerance like he had in the past, his age will certainly lead to a lower capacity with time. He should try to balance his expectations and exercise at a more modest level.      Other Visit Diagnoses       Other fatigue       Prior CBC and CMP ar enormal. I will check a TSH.   Relevant Orders   TSH     Need for immunization against influenza       Relevant Orders   Flu vaccine HIGH DOSE PF(Fluzone Trivalent) (Completed)       Return for Follow-up after testing/imaging.   Garnette CHRISTELLA Simpler, MD

## 2024-05-19 NOTE — Assessment & Plan Note (Signed)
 This is likely multifactorial related to ischemic cardiomyopathy with coronary artery disease, mild emphysema, and mild ILD. Devin Martin seems to feel this has been a sudden issue, but my notes indicate he has complained of this for almost a year. During that time, he underwent a repeat cath and stent placement. I am concerned that he may be over training and recommend against he trying to do sustained exercise at a level 10 on his elliptical. Although I can appreciate his desire to have an exercise tolerance like he had in the past, his age will certainly lead to a lower capacity with time. He should try to balance his expectations and exercise at a more modest level.

## 2024-05-19 NOTE — Assessment & Plan Note (Signed)
 Not a smoker. Continue albuterol as needed. This is likely a contributor to his DOE.

## 2024-05-19 NOTE — Assessment & Plan Note (Signed)
 Mild overall. This may be a contributor to his DOE.

## 2024-05-21 ENCOUNTER — Other Ambulatory Visit (INDEPENDENT_AMBULATORY_CARE_PROVIDER_SITE_OTHER)

## 2024-05-21 ENCOUNTER — Ambulatory Visit: Payer: Self-pay | Admitting: Family Medicine

## 2024-05-21 DIAGNOSIS — E291 Testicular hypofunction: Secondary | ICD-10-CM | POA: Diagnosis not present

## 2024-05-21 DIAGNOSIS — R5383 Other fatigue: Secondary | ICD-10-CM | POA: Diagnosis not present

## 2024-05-21 LAB — TSH: TSH: 1.58 u[IU]/mL (ref 0.35–5.50)

## 2024-05-21 LAB — FSH/LH
FSH: 7.8 m[IU]/mL (ref 1.4–12.8)
LH: 8.5 m[IU]/mL (ref 1.6–15.2)

## 2024-05-21 LAB — TESTOSTERONE: Testosterone: 129.28 ng/dL — ABNORMAL LOW (ref 300.00–890.00)

## 2024-05-24 ENCOUNTER — Encounter: Payer: Self-pay | Admitting: Family Medicine

## 2024-05-24 DIAGNOSIS — R0609 Other forms of dyspnea: Secondary | ICD-10-CM

## 2024-05-28 ENCOUNTER — Encounter: Payer: Self-pay | Admitting: Family Medicine

## 2024-05-28 ENCOUNTER — Other Ambulatory Visit (INDEPENDENT_AMBULATORY_CARE_PROVIDER_SITE_OTHER)

## 2024-05-28 DIAGNOSIS — E291 Testicular hypofunction: Secondary | ICD-10-CM | POA: Diagnosis not present

## 2024-05-28 DIAGNOSIS — R0609 Other forms of dyspnea: Secondary | ICD-10-CM | POA: Diagnosis not present

## 2024-05-28 LAB — TESTOSTERONE: Testosterone: 175.39 ng/dL — ABNORMAL LOW (ref 300.00–890.00)

## 2024-05-28 MED ORDER — TESTOSTERONE CYPIONATE 100 MG/ML IM SOLN: 100.0000 mg | INTRAMUSCULAR | 1 refills | Status: AC

## 2024-05-28 NOTE — Addendum Note (Signed)
 Addended by: THEDORA GARNETTE HERO on: 05/28/2024 05:47 PM   Modules accepted: Orders

## 2024-05-29 LAB — D-DIMER, QUANTITATIVE: D-Dimer, Quant: 0.44 ug{FEU}/mL (ref <0.50)

## 2024-06-02 DIAGNOSIS — R0609 Other forms of dyspnea: Secondary | ICD-10-CM | POA: Diagnosis not present

## 2024-06-08 ENCOUNTER — Encounter: Payer: Self-pay | Admitting: Family Medicine

## 2024-06-08 ENCOUNTER — Ambulatory Visit (INDEPENDENT_AMBULATORY_CARE_PROVIDER_SITE_OTHER)

## 2024-06-08 ENCOUNTER — Ambulatory Visit: Admitting: Sports Medicine

## 2024-06-08 VITALS — BP 134/78 | HR 89 | Ht 65.0 in | Wt 197.0 lb

## 2024-06-08 DIAGNOSIS — M25561 Pain in right knee: Secondary | ICD-10-CM

## 2024-06-08 DIAGNOSIS — M25461 Effusion, right knee: Secondary | ICD-10-CM | POA: Diagnosis not present

## 2024-06-08 DIAGNOSIS — M1711 Unilateral primary osteoarthritis, right knee: Secondary | ICD-10-CM | POA: Diagnosis not present

## 2024-06-08 DIAGNOSIS — S76311A Strain of muscle, fascia and tendon of the posterior muscle group at thigh level, right thigh, initial encounter: Secondary | ICD-10-CM | POA: Diagnosis not present

## 2024-06-08 NOTE — Progress Notes (Signed)
 Devin [facility] [address] [place] [address] Phone: [phone]   Assessment and Plan:     1. Acute pain of right knee (Primary) 2. Primary osteoarthritis of right knee 3. Strain of right hamstring muscle, initial encounter -Chronic with exacerbation, initial visit - Flare of right knee pain after fall occurring yesterday consistent with flare of osteoarthritis and hamstring strain - X-ray obtained in clinic.  My interpretation: No acute fracture or dislocation.  Moderately decreased medial joint space.  Lateral compartment chondrocalcinosis.  Severe patellofemoral degenerative changes - Start meloxicam  15 mg daily x2 weeks.  If still having pain after 2 weeks, complete 3rd-week of NSAID. May use remaining NSAID as needed once daily for pain control.  Do not to use additional over-the-counter NSAIDs (ibuprofen, naproxen, Advil, Aleve, etc.) while taking prescription NSAIDs.  May use Tylenol  [phone] mg 2 to 3 times a day for breakthrough pain.     Pertinent previous records reviewed include none   Follow Up: As needed if no improvement or worsening of symptoms in 2 to 4 weeks.  Could consider intra-articular CSI versus physical therapy   Subjective:   I, Devin Martin, am serving as a Neurosurgeon for Doctor Morene Mace  Chief Complaint: right knee pain   HPI:   06/09/2024 Patient is a 78 year old male with right knee pain. Patient states he hyperflexed his knee when he fell last night. Pain is in the hamstrings. Ibu helps with the pain. He does endorse soreness, decreased ROM. States he heard a weird noise when he fell.    Relevant Historical Information: Hypertension, OSA, GERD, prediabetes  Additional pertinent review of systems negative.   Current Outpatient Medications:    albuterol (VENTOLIN HFA) 108 (90 Base) MCG/ACT inhaler, SMARTSIG:2 Puff(s) By Mouth Every 6 Hours PRN, Disp: , Rfl:    AMBULATORY NON  FORMULARY MEDICATION, Prosthetic Leg Liners, Disp: 2 application, Rfl: 0   AMBULATORY NON FORMULARY MEDICATION, at bedtime. ResMed CPAP Machine, Disp: , Rfl:    Aspirin  81 MG EC tablet, Take 1 tablet (81 mg total) by mouth daily., Disp: , Rfl:    carvedilol  (COREG ) 12.5 MG tablet, Take 1 tablet (12.5 mg total) by mouth 2 (two) times daily., Disp: 180 tablet, Rfl: 3   Cholecalciferol (VITAMIN D3) 50 MCG (2000 UT) capsule, Take 2,000 Units by mouth daily., Disp: , Rfl:    clopidogrel  (PLAVIX ) 75 MG tablet, TAKE 1 TABLET(75 MG) BY MOUTH DAILY WITH BREAKFAST, Disp: 90 tablet, Rfl: 3   Cyanocobalamin  (VITAMIN B-12 PO), Take 1 tablet by mouth daily., Disp: , Rfl:    irbesartan  (AVAPRO ) 150 MG tablet, TAKE 1 TABLET(150 MG) BY MOUTH DAILY, Disp: 90 tablet, Rfl: 3   isosorbide  mononitrate (IMDUR ) 60 MG 24 hr tablet, TAKE 1 AND 1/2 TABLETS(90 MG) BY MOUTH EVERY EVENING, Disp: 135 tablet, Rfl: 3   meloxicam  (MOBIC ) 15 MG tablet, TAKE 1 TABLET(15 MG) BY MOUTH DAILY AS NEEDED, Disp: 30 tablet, Rfl: 5   mupirocin  ointment (BACTROBAN ) 2 %, Apply 1 Application topically 2 (two) times daily., Disp: 22 g, Rfl: 0   nitroGLYCERIN  (NITROSTAT ) 0.4 MG SL tablet, Place 1 tablet (0.4 mg total) under the tongue every 5 minutes as needed for chest pain., Disp: 25 tablet, Rfl: 2   Nitroglycerin  0.4 % OINT, Place 1 Application rectally in the morning and at bedtime. Apply 1 inch (375 mg) ointment intra-anally every 12 hours for anal fissure, Disp: 30 g, Rfl: 1  potassium citrate  (UROCIT-K ) 10 MEQ (1080 MG) SR tablet, TAKE 2 TABLETS BY MOUTH DAILY, Disp: 180 tablet, Rfl: 3   rosuvastatin  (CRESTOR ) 40 MG tablet, TAKE 1 TABLET(40 MG) BY MOUTH DAILY, Disp: 90 tablet, Rfl: 3   testosterone  cypionate (DEPOTESTOTERONE CYPIONATE) 100 MG/ML injection, Inject 1 mL (100 mg total) into the muscle every 7 (seven) days. For IM use only, Disp: 10 mL, Rfl: 1   TURMERIC PO, Take 1,500-3,000 mg by mouth daily. 1500 mg each alternate every mouth,  Disp: , Rfl:    Objective:     Vitals:   06/08/24 1507  BP: 134/78  Pulse: 89  SpO2: 98%  Weight: 197 lb (89.4 kg)  Height: 5' 5 (1.651 m)      Body mass index is 32.78 kg/m.    Physical Exam:    General:  awake, alert oriented, no acute distress nontoxic Skin: no suspicious lesions or rashes Neuro: Patient with history of left BKA.  Besides BKA, sensation intact and strength 5/5 with no deficits, no atrophy, normal muscle tone Psych: No signs of anxiety, depression or other mood disorder  Right knee: Mild swelling with anterior abrasion No deformity Neg fluid wave, joint milking ROM Flex 110, Ext 0 TTP posterior fossa NTTP over the quad tendon, medial fem condyle, lat fem condyle, patella, plica, patella tendon, tibial tuberostiy, fibular head,  , pes anserine bursa, gerdy's tubercle, medial jt line, lateral jt line Neg anterior and posterior drawer Neg lachman Neg sag sign Negative varus stress Negative valgus stress Negative McMurray Distal hamstring pain with resisted knee flexion.  No pain with resisted knee extension Gait antalgic with left BKA, and supporting right leg with cane   Electronically signed by:  Odis Mace [facility] 4:00 PM 06/08/24

## 2024-06-08 NOTE — Patient Instructions (Signed)
-   Start meloxicam  15 mg daily x2 weeks.  If still having pain after 2 weeks, complete 3rd-week of NSAID. May use remaining NSAID as needed once daily for pain control.  Do not to use additional over-the-counter NSAIDs (ibuprofen, naproxen, Advil, Aleve, etc.) while taking prescription NSAIDs.  May use Tylenol  [phone] mg 2 to 3 times a day for breakthrough pain.  Can call If you need a refill   After complete meloxicam  - Use meloxicam  15 mg daily as needed for breakthrough pain.  Recommend limiting chronic NSAIDs to 1-2 doses per week to prevent long-term side effects. Use Tylenol  500 to 1000 mg tablets 2-3 times a day as needed for day-to-day pain relief.    Use compression sleeve   As needed follow up if no improvement 3-4 week follow up

## 2024-06-09 DIAGNOSIS — E669 Obesity, unspecified: Secondary | ICD-10-CM | POA: Diagnosis not present

## 2024-06-09 DIAGNOSIS — R0609 Other forms of dyspnea: Secondary | ICD-10-CM | POA: Diagnosis not present

## 2024-06-09 DIAGNOSIS — J849 Interstitial pulmonary disease, unspecified: Secondary | ICD-10-CM | POA: Diagnosis not present

## 2024-06-09 DIAGNOSIS — G4733 Obstructive sleep apnea (adult) (pediatric): Secondary | ICD-10-CM | POA: Diagnosis not present

## 2024-06-09 DIAGNOSIS — Z87891 Personal history of nicotine dependence: Secondary | ICD-10-CM | POA: Diagnosis not present

## 2024-06-11 ENCOUNTER — Encounter: Payer: Self-pay | Admitting: Family Medicine

## 2024-06-14 ENCOUNTER — Ambulatory Visit: Payer: Self-pay | Admitting: Sports Medicine

## 2024-07-12 ENCOUNTER — Encounter: Payer: Self-pay | Admitting: Cardiology

## 2024-07-19 ENCOUNTER — Encounter: Payer: Self-pay | Admitting: Cardiology

## 2024-08-09 ENCOUNTER — Other Ambulatory Visit: Payer: Self-pay

## 2024-08-11 MED ORDER — ROSUVASTATIN CALCIUM 40 MG PO TABS: ORAL_TABLET | ORAL | 2 refills | Status: AC

## 2024-08-23 ENCOUNTER — Encounter: Payer: Self-pay | Admitting: Cardiology

## 2024-08-23 MED ORDER — CARVEDILOL 12.5 MG PO TABS: 12.5000 mg | ORAL_TABLET | Freq: Two times a day (BID) | ORAL | 1 refills | Status: AC

## 2024-08-30 ENCOUNTER — Encounter: Payer: Self-pay | Admitting: Family Medicine

## 2024-09-05 ENCOUNTER — Encounter: Payer: Self-pay | Admitting: Family Medicine

## 2024-09-12 ENCOUNTER — Other Ambulatory Visit: Payer: Self-pay | Admitting: Cardiology

## 2024-10-15 ENCOUNTER — Ambulatory Visit: Admitting: Cardiology

## 2024-12-20 ENCOUNTER — Ambulatory Visit
